# Patient Record
Sex: Female | Born: 1935 | Race: White | Hispanic: No | Smoking: Former smoker
Health system: Southern US, Community
[De-identification: ages and names within clinical notes are randomized; demographics above are authoritative.]

## PROBLEM LIST (undated history)

## (undated) DIAGNOSIS — I4891 Unspecified atrial fibrillation: Secondary | ICD-10-CM

## (undated) DIAGNOSIS — Z9889 Other specified postprocedural states: Secondary | ICD-10-CM

## (undated) DIAGNOSIS — R112 Nausea with vomiting, unspecified: Secondary | ICD-10-CM

## (undated) DIAGNOSIS — E785 Hyperlipidemia, unspecified: Secondary | ICD-10-CM

## (undated) DIAGNOSIS — K219 Gastro-esophageal reflux disease without esophagitis: Secondary | ICD-10-CM

## (undated) DIAGNOSIS — K589 Irritable bowel syndrome without diarrhea: Secondary | ICD-10-CM

## (undated) DIAGNOSIS — M1990 Unspecified osteoarthritis, unspecified site: Secondary | ICD-10-CM

## (undated) DIAGNOSIS — D329 Benign neoplasm of meninges, unspecified: Secondary | ICD-10-CM

## (undated) DIAGNOSIS — S42309A Unspecified fracture of shaft of humerus, unspecified arm, initial encounter for closed fracture: Secondary | ICD-10-CM

## (undated) DIAGNOSIS — B1920 Unspecified viral hepatitis C without hepatic coma: Secondary | ICD-10-CM

## (undated) DIAGNOSIS — I10 Essential (primary) hypertension: Secondary | ICD-10-CM

## (undated) HISTORY — DX: Hyperlipidemia, unspecified: E78.5

## (undated) HISTORY — DX: Gastro-esophageal reflux disease without esophagitis: K21.9

## (undated) HISTORY — DX: Unspecified viral hepatitis C without hepatic coma: B19.20

## (undated) HISTORY — PX: TOTAL KNEE ARTHROPLASTY: SHX125

## (undated) HISTORY — PX: COLONOSCOPY W/ BIOPSIES AND POLYPECTOMY: SHX1376

## (undated) HISTORY — DX: Unspecified osteoarthritis, unspecified site: M19.90

## (undated) HISTORY — DX: Essential (primary) hypertension: I10

## (undated) HISTORY — PX: PAROTID GLAND TUMOR EXCISION: SHX5221

## (undated) HISTORY — DX: Unspecified atrial fibrillation: I48.91

## (undated) HISTORY — PX: BREAST SURGERY: SHX581

## (undated) HISTORY — DX: Irritable bowel syndrome, unspecified: K58.9

## (undated) HISTORY — DX: Benign neoplasm of meninges, unspecified: D32.9

---

## 1997-07-18 ENCOUNTER — Other Ambulatory Visit: Admission: RE | Admit: 1997-07-18 | Discharge: 1997-07-18 | Payer: Self-pay | Admitting: Family Medicine

## 1997-07-25 ENCOUNTER — Other Ambulatory Visit: Admission: RE | Admit: 1997-07-25 | Discharge: 1997-07-25 | Payer: Self-pay | Admitting: Family Medicine

## 1998-02-27 ENCOUNTER — Ambulatory Visit (HOSPITAL_COMMUNITY): Admission: RE | Admit: 1998-02-27 | Discharge: 1998-02-27 | Payer: Self-pay | Admitting: Family Medicine

## 1999-01-28 ENCOUNTER — Emergency Department (HOSPITAL_COMMUNITY): Admission: EM | Admit: 1999-01-28 | Discharge: 1999-01-28 | Payer: Self-pay | Admitting: Emergency Medicine

## 1999-01-28 ENCOUNTER — Encounter: Payer: Self-pay | Admitting: Emergency Medicine

## 1999-03-02 ENCOUNTER — Inpatient Hospital Stay (HOSPITAL_COMMUNITY): Admission: EM | Admit: 1999-03-02 | Discharge: 1999-03-06 | Payer: Self-pay | Admitting: Family Medicine

## 1999-03-02 ENCOUNTER — Encounter (INDEPENDENT_AMBULATORY_CARE_PROVIDER_SITE_OTHER): Payer: Self-pay | Admitting: Specialist

## 1999-03-30 ENCOUNTER — Other Ambulatory Visit: Admission: RE | Admit: 1999-03-30 | Discharge: 1999-03-30 | Payer: Self-pay | Admitting: Family Medicine

## 1999-04-05 ENCOUNTER — Encounter: Admission: RE | Admit: 1999-04-05 | Discharge: 1999-04-05 | Payer: Self-pay | Admitting: Family Medicine

## 1999-04-05 ENCOUNTER — Encounter: Payer: Self-pay | Admitting: Family Medicine

## 2000-06-27 ENCOUNTER — Encounter (HOSPITAL_BASED_OUTPATIENT_CLINIC_OR_DEPARTMENT_OTHER): Payer: Self-pay | Admitting: Internal Medicine

## 2000-06-27 ENCOUNTER — Encounter: Admission: RE | Admit: 2000-06-27 | Discharge: 2000-06-27 | Payer: Self-pay | Admitting: Internal Medicine

## 2001-07-09 ENCOUNTER — Encounter: Admission: RE | Admit: 2001-07-09 | Discharge: 2001-07-09 | Payer: Self-pay | Admitting: Internal Medicine

## 2001-07-09 ENCOUNTER — Encounter (HOSPITAL_BASED_OUTPATIENT_CLINIC_OR_DEPARTMENT_OTHER): Payer: Self-pay | Admitting: Internal Medicine

## 2002-01-13 ENCOUNTER — Encounter: Admission: RE | Admit: 2002-01-13 | Discharge: 2002-01-29 | Payer: Self-pay | Admitting: Internal Medicine

## 2002-07-30 ENCOUNTER — Encounter: Admission: RE | Admit: 2002-07-30 | Discharge: 2002-07-30 | Payer: Self-pay | Admitting: Internal Medicine

## 2002-07-30 ENCOUNTER — Encounter (HOSPITAL_BASED_OUTPATIENT_CLINIC_OR_DEPARTMENT_OTHER): Payer: Self-pay | Admitting: Internal Medicine

## 2002-09-09 ENCOUNTER — Encounter (HOSPITAL_BASED_OUTPATIENT_CLINIC_OR_DEPARTMENT_OTHER): Payer: Self-pay | Admitting: Internal Medicine

## 2002-09-09 ENCOUNTER — Encounter: Admission: RE | Admit: 2002-09-09 | Discharge: 2002-09-09 | Payer: Self-pay | Admitting: Internal Medicine

## 2002-09-09 ENCOUNTER — Encounter: Payer: Self-pay | Admitting: Radiology

## 2002-09-24 ENCOUNTER — Encounter: Admission: RE | Admit: 2002-09-24 | Discharge: 2002-09-24 | Payer: Self-pay | Admitting: Internal Medicine

## 2002-09-24 ENCOUNTER — Encounter (HOSPITAL_BASED_OUTPATIENT_CLINIC_OR_DEPARTMENT_OTHER): Payer: Self-pay | Admitting: Internal Medicine

## 2002-10-05 ENCOUNTER — Ambulatory Visit (HOSPITAL_COMMUNITY): Admission: RE | Admit: 2002-10-05 | Discharge: 2002-10-05 | Payer: Self-pay | Admitting: Orthopedic Surgery

## 2003-03-10 ENCOUNTER — Encounter: Admission: RE | Admit: 2003-03-10 | Discharge: 2003-03-10 | Payer: Self-pay | Admitting: Internal Medicine

## 2003-12-20 ENCOUNTER — Ambulatory Visit: Payer: Self-pay | Admitting: Gastroenterology

## 2003-12-23 ENCOUNTER — Ambulatory Visit: Payer: Self-pay | Admitting: Gastroenterology

## 2003-12-28 ENCOUNTER — Ambulatory Visit: Payer: Self-pay | Admitting: Gastroenterology

## 2003-12-30 ENCOUNTER — Ambulatory Visit (HOSPITAL_COMMUNITY): Admission: RE | Admit: 2003-12-30 | Discharge: 2003-12-30 | Payer: Self-pay | Admitting: Gastroenterology

## 2004-01-31 ENCOUNTER — Ambulatory Visit (HOSPITAL_COMMUNITY): Admission: RE | Admit: 2004-01-31 | Discharge: 2004-01-31 | Payer: Self-pay | Admitting: Gastroenterology

## 2004-01-31 ENCOUNTER — Ambulatory Visit: Payer: Self-pay | Admitting: Gastroenterology

## 2004-02-14 ENCOUNTER — Ambulatory Visit: Payer: Self-pay | Admitting: Gastroenterology

## 2004-03-02 ENCOUNTER — Ambulatory Visit (HOSPITAL_COMMUNITY): Admission: RE | Admit: 2004-03-02 | Discharge: 2004-03-02 | Payer: Self-pay | Admitting: Gastroenterology

## 2004-03-13 ENCOUNTER — Ambulatory Visit: Payer: Self-pay | Admitting: Gastroenterology

## 2004-04-11 ENCOUNTER — Encounter: Admission: RE | Admit: 2004-04-11 | Discharge: 2004-04-11 | Payer: Self-pay | Admitting: Internal Medicine

## 2004-06-21 ENCOUNTER — Ambulatory Visit: Payer: Self-pay | Admitting: Gastroenterology

## 2004-08-03 ENCOUNTER — Ambulatory Visit: Payer: Self-pay | Admitting: Gastroenterology

## 2005-05-01 ENCOUNTER — Encounter: Admission: RE | Admit: 2005-05-01 | Discharge: 2005-05-01 | Payer: Self-pay | Admitting: Internal Medicine

## 2005-05-06 ENCOUNTER — Encounter: Admission: RE | Admit: 2005-05-06 | Discharge: 2005-05-06 | Payer: Self-pay | Admitting: Internal Medicine

## 2005-11-07 HISTORY — PX: TOTAL HIP ARTHROPLASTY: SHX124

## 2005-11-11 ENCOUNTER — Inpatient Hospital Stay (HOSPITAL_COMMUNITY): Admission: EM | Admit: 2005-11-11 | Discharge: 2005-11-15 | Payer: Self-pay | Admitting: Emergency Medicine

## 2005-11-12 ENCOUNTER — Ambulatory Visit: Payer: Self-pay | Admitting: Physical Medicine & Rehabilitation

## 2006-05-07 ENCOUNTER — Encounter: Admission: RE | Admit: 2006-05-07 | Discharge: 2006-05-07 | Payer: Self-pay | Admitting: Internal Medicine

## 2006-11-10 ENCOUNTER — Emergency Department (HOSPITAL_COMMUNITY): Admission: EM | Admit: 2006-11-10 | Discharge: 2006-11-10 | Payer: Self-pay | Admitting: Emergency Medicine

## 2006-11-14 HISTORY — PX: CARDIOVASCULAR STRESS TEST: SHX262

## 2006-12-05 ENCOUNTER — Encounter: Admission: RE | Admit: 2006-12-05 | Discharge: 2006-12-05 | Payer: Self-pay | Admitting: Orthopedic Surgery

## 2007-01-07 ENCOUNTER — Inpatient Hospital Stay (HOSPITAL_COMMUNITY): Admission: RE | Admit: 2007-01-07 | Discharge: 2007-01-12 | Payer: Self-pay | Admitting: Orthopedic Surgery

## 2007-05-08 ENCOUNTER — Encounter: Admission: RE | Admit: 2007-05-08 | Discharge: 2007-05-08 | Payer: Self-pay | Admitting: Internal Medicine

## 2007-07-29 ENCOUNTER — Other Ambulatory Visit: Admission: RE | Admit: 2007-07-29 | Discharge: 2007-07-29 | Payer: Self-pay | Admitting: Otolaryngology

## 2007-08-06 ENCOUNTER — Encounter: Admission: RE | Admit: 2007-08-06 | Discharge: 2007-08-06 | Payer: Self-pay | Admitting: Otolaryngology

## 2007-10-07 ENCOUNTER — Ambulatory Visit (HOSPITAL_COMMUNITY): Admission: RE | Admit: 2007-10-07 | Discharge: 2007-10-08 | Payer: Self-pay | Admitting: Otolaryngology

## 2007-10-07 ENCOUNTER — Encounter (INDEPENDENT_AMBULATORY_CARE_PROVIDER_SITE_OTHER): Payer: Self-pay | Admitting: Otolaryngology

## 2008-05-09 ENCOUNTER — Encounter: Admission: RE | Admit: 2008-05-09 | Discharge: 2008-05-09 | Payer: Self-pay | Admitting: Internal Medicine

## 2008-07-25 ENCOUNTER — Inpatient Hospital Stay (HOSPITAL_COMMUNITY): Admission: RE | Admit: 2008-07-25 | Discharge: 2008-07-28 | Payer: Self-pay | Admitting: Orthopedic Surgery

## 2008-08-29 ENCOUNTER — Inpatient Hospital Stay (HOSPITAL_COMMUNITY): Admission: AD | Admit: 2008-08-29 | Discharge: 2008-08-31 | Payer: Self-pay | Admitting: Internal Medicine

## 2008-11-25 ENCOUNTER — Inpatient Hospital Stay (HOSPITAL_COMMUNITY): Admission: EM | Admit: 2008-11-25 | Discharge: 2008-11-26 | Payer: Self-pay | Admitting: Emergency Medicine

## 2008-12-02 ENCOUNTER — Encounter: Admission: RE | Admit: 2008-12-02 | Discharge: 2008-12-02 | Payer: Self-pay | Admitting: Internal Medicine

## 2009-05-10 ENCOUNTER — Encounter: Admission: RE | Admit: 2009-05-10 | Discharge: 2009-05-10 | Payer: Self-pay | Admitting: Internal Medicine

## 2009-05-11 ENCOUNTER — Encounter: Admission: RE | Admit: 2009-05-11 | Discharge: 2009-05-11 | Payer: Self-pay | Admitting: Gastroenterology

## 2009-10-16 ENCOUNTER — Ambulatory Visit: Payer: Self-pay | Admitting: Cardiology

## 2009-10-25 ENCOUNTER — Ambulatory Visit: Payer: Self-pay | Admitting: Cardiovascular Disease

## 2009-10-25 ENCOUNTER — Ambulatory Visit: Payer: Self-pay

## 2009-10-25 ENCOUNTER — Encounter: Payer: Self-pay | Admitting: Cardiology

## 2009-10-25 ENCOUNTER — Ambulatory Visit (HOSPITAL_COMMUNITY): Admission: RE | Admit: 2009-10-25 | Discharge: 2009-10-25 | Payer: Self-pay | Admitting: Cardiology

## 2009-10-25 HISTORY — PX: TRANSTHORACIC ECHOCARDIOGRAM: SHX275

## 2009-10-30 ENCOUNTER — Ambulatory Visit: Payer: Self-pay | Admitting: Cardiology

## 2009-10-30 ENCOUNTER — Encounter: Admission: RE | Admit: 2009-10-30 | Discharge: 2009-10-30 | Payer: Self-pay | Admitting: Cardiology

## 2009-11-13 ENCOUNTER — Ambulatory Visit: Payer: Self-pay | Admitting: Cardiology

## 2009-11-14 ENCOUNTER — Ambulatory Visit (HOSPITAL_COMMUNITY): Admission: RE | Admit: 2009-11-14 | Discharge: 2009-11-14 | Payer: Self-pay | Admitting: Cardiology

## 2009-11-21 ENCOUNTER — Encounter: Admission: RE | Admit: 2009-11-21 | Discharge: 2009-11-21 | Payer: Self-pay | Admitting: Neurosurgery

## 2009-12-12 ENCOUNTER — Ambulatory Visit: Payer: Self-pay | Admitting: Cardiovascular Disease

## 2009-12-19 ENCOUNTER — Ambulatory Visit: Payer: Self-pay | Admitting: Cardiology

## 2010-01-18 ENCOUNTER — Ambulatory Visit: Payer: Self-pay | Admitting: Cardiology

## 2010-01-28 ENCOUNTER — Encounter (HOSPITAL_BASED_OUTPATIENT_CLINIC_OR_DEPARTMENT_OTHER): Payer: Self-pay | Admitting: Internal Medicine

## 2010-02-13 ENCOUNTER — Other Ambulatory Visit: Payer: Self-pay | Admitting: Gastroenterology

## 2010-02-14 ENCOUNTER — Other Ambulatory Visit: Payer: Self-pay | Admitting: Gastroenterology

## 2010-02-19 ENCOUNTER — Other Ambulatory Visit (INDEPENDENT_AMBULATORY_CARE_PROVIDER_SITE_OTHER): Payer: Medicare Other

## 2010-02-19 DIAGNOSIS — E7800 Pure hypercholesterolemia, unspecified: Secondary | ICD-10-CM

## 2010-02-19 DIAGNOSIS — I10 Essential (primary) hypertension: Secondary | ICD-10-CM

## 2010-02-20 ENCOUNTER — Ambulatory Visit
Admission: RE | Admit: 2010-02-20 | Discharge: 2010-02-20 | Disposition: A | Discharge status: Home / Self Care | Payer: Medicare Other | Source: Ambulatory Visit | Attending: Gastroenterology | Admitting: Gastroenterology

## 2010-03-06 ENCOUNTER — Inpatient Hospital Stay (HOSPITAL_COMMUNITY)
Admission: AD | Admit: 2010-03-06 | Discharge: 2010-03-09 | DRG: 392 | Disposition: A | Discharge status: Home / Self Care | Payer: Medicare Other | Source: Ambulatory Visit | Attending: Gastroenterology | Admitting: Gastroenterology

## 2010-03-06 ENCOUNTER — Inpatient Hospital Stay (HOSPITAL_COMMUNITY): Payer: Medicare Other

## 2010-03-06 DIAGNOSIS — Z96659 Presence of unspecified artificial knee joint: Secondary | ICD-10-CM

## 2010-03-06 DIAGNOSIS — K219 Gastro-esophageal reflux disease without esophagitis: Secondary | ICD-10-CM | POA: Diagnosis present

## 2010-03-06 DIAGNOSIS — Z96649 Presence of unspecified artificial hip joint: Secondary | ICD-10-CM

## 2010-03-06 DIAGNOSIS — I4891 Unspecified atrial fibrillation: Secondary | ICD-10-CM | POA: Diagnosis present

## 2010-03-06 DIAGNOSIS — Z79899 Other long term (current) drug therapy: Secondary | ICD-10-CM

## 2010-03-06 DIAGNOSIS — K5900 Constipation, unspecified: Secondary | ICD-10-CM | POA: Diagnosis present

## 2010-03-06 DIAGNOSIS — K3184 Gastroparesis: Principal | ICD-10-CM | POA: Diagnosis present

## 2010-03-06 DIAGNOSIS — E785 Hyperlipidemia, unspecified: Secondary | ICD-10-CM | POA: Diagnosis present

## 2010-03-06 DIAGNOSIS — M129 Arthropathy, unspecified: Secondary | ICD-10-CM | POA: Diagnosis present

## 2010-03-06 DIAGNOSIS — I498 Other specified cardiac arrhythmias: Secondary | ICD-10-CM | POA: Diagnosis present

## 2010-03-06 DIAGNOSIS — E860 Dehydration: Secondary | ICD-10-CM | POA: Diagnosis present

## 2010-03-06 LAB — COMPREHENSIVE METABOLIC PANEL
ALT: 17 U/L (ref 0–35)
AST: 20 U/L (ref 0–37)
Albumin: 3.8 g/dL (ref 3.5–5.2)
Alkaline Phosphatase: 80 U/L (ref 39–117)
BUN: 16 mg/dL (ref 6–23)
CO2: 27 mEq/L (ref 19–32)
Calcium: 9.8 mg/dL (ref 8.4–10.5)
Chloride: 100 mEq/L (ref 96–112)
Creatinine, Ser: 0.95 mg/dL (ref 0.4–1.2)
GFR calc Af Amer: >60 mL/min (ref >60)
GFR calc non Af Amer: 57 mL/min — ABNORMAL LOW (ref >60)
Glucose, Bld: 89 mg/dL (ref 70–99)
Potassium: 3.9 mEq/L (ref 3.5–5.1)
Sodium: 140 mEq/L (ref 135–145)
Total Bilirubin: 0.9 mg/dL (ref 0.3–1.2)
Total Protein: 6.1 g/dL (ref 6.0–8.3)

## 2010-03-06 LAB — CBC
HCT: 39.1 % (ref 36.0–46.0)
Hemoglobin: 13 g/dL (ref 12.0–15.0)
MCH: 29.3 pg (ref 26.0–34.0)
MCHC: 33.2 g/dL (ref 30.0–36.0)
MCV: 88.1 fL (ref 78.0–100.0)
Platelets: 208 10*3/uL (ref 150–400)
RBC: 4.44 MIL/uL (ref 3.87–5.11)
RDW: 13.3 % (ref 11.5–15.5)
WBC: 8.4 10*3/uL (ref 4.0–10.5)

## 2010-03-06 LAB — LIPASE, BLOOD: Lipase: 26 U/L (ref 11–59)

## 2010-03-06 LAB — AMYLASE: Amylase: 64 U/L (ref 0–105)

## 2010-03-20 ENCOUNTER — Ambulatory Visit (INDEPENDENT_AMBULATORY_CARE_PROVIDER_SITE_OTHER): Payer: Medicare Other | Admitting: Nurse Practitioner

## 2010-03-20 DIAGNOSIS — I10 Essential (primary) hypertension: Secondary | ICD-10-CM

## 2010-03-20 DIAGNOSIS — I4891 Unspecified atrial fibrillation: Secondary | ICD-10-CM

## 2010-03-20 LAB — CBC
HCT: 39.8 % (ref 36.0–46.0)
Hemoglobin: 13.1 g/dL (ref 12.0–15.0)
MCH: 30.6 pg (ref 26.0–34.0)
MCHC: 32.9 g/dL (ref 30.0–36.0)
MCV: 93 fL (ref 78.0–100.0)
Platelets: 198 10*3/uL (ref 150–400)
RBC: 4.28 MIL/uL (ref 3.87–5.11)
RDW: 14 % (ref 11.5–15.5)
WBC: 8 10*3/uL (ref 4.0–10.5)

## 2010-03-23 NOTE — H&P (Signed)
  Gloria [name], Gloria [name]              ACCOUNT NO.:  [id number]  MEDICAL RECORD NO.:  [id number]           PATIENT TYPE:  LOCATION:                                 FACILITY:  PHYSICIAN:  Ahjanae Cassel L. Malon Kindle., M.D.DATE OF BIRTH:  Oct 04, 1930  DATE OF ADMISSION: DATE OF DISCHARGE:                             HISTORY & PHYSICAL   ADDENDUM  PHYSICAL EXAMINATION:  VITAL SIGNS:  Temperature 96.7; weight 150, down approximately 8 pounds; pulse 68; blood pressure 122/68. GENERAL:  A somewhat shaky white female, in no acute distress. HEENT:  Eyes:  Sclerae nonicteric. NECK:  Supple.  No lymphadenopathy. LUNGS:  Clear anteriorly and posteriorly. HEART:  Regular rate and rhythm without murmurs or gallops. ABDOMEN:  Soft and completely nontender.  Bowel sounds are normal.  ASSESSMENT: 1. Nausea, vomiting, and dehydration - this could be due to many     things.  She is complaining of coughing up material which is new.     It is possibly she could have a pneumonia or urinary tract     infection and this could all be her gastroparesis.  She is     dehydrated and she will need to come in for fluids and further     workup. 2. Gastroparesis with previous treatments with domperidone from     [place], Zelnorm, Reglan, and most recently erythromycin.  Recent     workup including esophagogastroduodenoscopy and gallbladder     ultrasound had been negative. 3. Atrial fibrillation on flecainide and Pradaxa. 4. Hypertension. 5. Small brain tumor that apparently is not going and is felt to be     insignificant and has been followed with CTs and MRIs.  PLAN:  We will admit the patient to the hospital with IV fluids.  We will obtain chest x-ray.  We will put her on IV erythromycin.  Dr. Silvano Rusk group will see her here in the hospital.  We will also obtain EKG.          ______________________________ Llana Aliment Malon Kindle., M.D.     Waldron Session  D:  03/06/2010  T:  03/07/2010  Job:  [id number]  cc:    Barry Dienes. Eloise Harman, M.D. Peter M. [place]il[place], M.D.  Electronically Signed by Carman Ching M.D. on 03/23/2010 09:26:44 AM

## 2010-03-23 NOTE — H&P (Signed)
[name], [name]              ACCOUNT NO.:  [id number]  MEDICAL RECORD NO.:  [id number]           PATIENT TYPE:  I  LOCATION:  5530                         FACILITY:  [facility]  PHYSICIAN:  Gloria [name]., M.D.DATE OF BIRTH:  [birth year]  DATE OF ADMISSION:  03/06/2010 DATE OF DISCHARGE:                             HISTORY & PHYSICAL   PRIMARY CARE PHYSICIAN:  Gloria Dienes. Eloise Harman, MD  CARDIOLOGIST:  Gloria M. [place]il[place], MD  REASON FOR ADMISSION:  Nausea, vomiting, coughing, and dehydration.  HISTORY:  The patient is a 77 year old woman who has recently come back to our office after being absent for approximately 6 months.  She has gastroparesis and has had this for a number of years.  She has had ultrasounds of the abdomen which have been negative and has had documented gastroparesis with gastric emptying scan.  She has been on omeprazole.  She has been on low-dose erythromycin which has been doing well.  When we last saw her in August, she was asymptomatic.  She has developed atrial fib and has been on flecainide and Pradaxa.  She was taking the erythromycin and that was stopped because of some potential interaction with the flecainide.  She developed progressive bloating and nausea, inability to eat, constipation, and had lost some weight.  We saw her in the office and discussed this with Dr. Swaziland and it appears flecainide and erythromycin do not have an interaction as with some other antibiotics and we started her back on erythromycin. Unfortunately, this really did not help and she has continued to have nausea and vomiting and has been seen in the office on several occasions.  She had been on Reglan in the past and was not sure why she did use it and this was started back and has caused severe shaking and tremors.  She came back to the office today having lost approximately 8 pounds with continued nausea and vomiting.  According to her daughter, she has not kept down  anything now for several days since feeling weak and dizzy and faint.  She also has been coughing and states that she has been coughing ever since she was started on lisinopril.  Apparently, the dose of that has been cut but she still having coughing.  Upon talking with her, it is not clear whether the coughing and the vomiting is coming first, but this apparently is causing her to have some abdominal pain in the upper abdomen when coughing and vomiting.  She is coughing up some greenish material.  She also states that she is having some chills, this usually occurs after coughing.  She comes as a work-in today with her daughter because she has been unable to keep down any food, liquid, or medications now for about 2 days.  CURRENT MEDICATIONS: 1. Crestor 20 mg daily 2. Omeprazole 40 mg b.i.d. 3. Flecainide 50 mg b.i.d. 4. Hydrochlorothiazide 25 mg daily. 5. Lisinopril 10 mg one-half tablet daily was just changed recently     from 20 mg daily. 6. Reglan 5 mg a.c. and h.s. 7. Erythromycin 250 mg t.i.d. 8. Xarelto 20 mg daily.  She  is allergic to PENICILLIN, CODEINE, and CEPHALOSPORINS.  MEDICAL HISTORY:  The patient has a history of a meningioma tumor followed by Gloria [name].  This apparently has not grown and has just been followed with CT scans and MRIs yearly with most recent scan showing no real change.  She has a history of gastroparesis and reflux, has bradycardia atrial fibrillation followed by Dr. Peter [name].  She has high cholesterol and arthritis.  Previous surgeries include hip replacement, knee replacement, some type of neurosurgical procedure in 2009, colonoscopies, and EGDs.  FAMILY HISTORY:  Noncontributory.  SOCIAL HISTORY:  She is a smoker, quit about 25 years ago.  Does not drink alcohol.  She is retired, married, and has two children.  PHYSICAL EXAMINATION:  VITAL SIGNS:  Temperature 96.7, weight 150 down approximately 8 pounds in the past 2 or 3 months,,  pulse 68, blood pressure 122/68. GENERAL:  Somewhat shaky pale white female. EYES:  Sclerae nonicteric. NECK:  Supple. LUNGS:  Clear anteriorly and posteriorly. ABDOMEN:  Soft  Dictation ended at this point.          ______________________________ Gloria Aliment. Malon [name]., M.D.     Gloria [name]  D:  03/06/2010  T:  03/07/2010  Job:  [id number]  Electronically Signed by Gloria [name] M.D. on 03/23/2010 09:26:37 AM

## 2010-04-10 NOTE — Consult Note (Signed)
Gloria [name], Gloria [name]              ACCOUNT NO.:  [id number]  MEDICAL RECORD NO.:  [id number]           PATIENT TYPE:  I  LOCATION:  5530                         FACILITY:  [facility]  PHYSICIAN:  Larina Earthly, M.D.        DATE OF BIRTH:  16-Sep-1930  DATE OF CONSULTATION:  03/06/2010 DATE OF DISCHARGE:                                CONSULTATION   REASON FOR CONSULTATION:  Medical management.  HISTORY OF PRESENT ILLNESS:  This is a 77 year old Caucasian female who has a long history of gastroparesis well controlled on erythromycin, and was diagnosed with atrial fibrillation last November and placed on flecainide as well as anticoagulation consisting of Pradaxa.  She now presents with a 1 month history of worsening nausea, vomiting with some subtle right-sided abdominal pain.  Per Dr. Randa Evens, her erythromycin that she was normally taking was discontinued secondary to possible interactions with her flecainide.  Soon after discontinuation of this medication, the patient developed significant nausea and vomiting.  Dr. Randa Evens did discuss this with Dr. Peter [place]iland by report and erythromycin was restarted, however, the patient had persistent nausea and vomiting despite this initiation.  Reglan was also started, however, the patient developed tremors and shakes and this was subsequently discontinued.  The patient did also undergo subsequent ultrasound that was unremarkable as well as endoscopy that was unremarkable.  Throughout this time, laboratory evaluation was also unremarkable for any etiology. The patient states that she has been on the flecainide since being diagnosed with the atrial fibrillation and DC cardioversion was everted secondary to her conversion back to normal sinus rhythm.  She states that despite the above-mentioned interventions for her gastroparesis, she has been quite miserable for the last 4 weeks and saw Dr. Randa Evens for an office visit and given her persistent nausea  and vomiting as well as the definitive need to maintain her antiarrhythmic therapy as well as anticoagulation, he advised admission for further evaluation and management for this refractory nausea and vomiting.  We were called to consult for medical management, especially in the setting of her refractory gastroparesis, atrial fibrillation, and treatment of hypertension and hyperlipidemia.  Currently, the patient states that she has not had a significant amount of nausea and vomiting today for unclear reasons.  She denies any fevers, chills, focal neurological deficits, headaches, visual abnormalities, chest pain or shortness of breath.  She does have some intermittent issues with right-sided flank pain which has been somewhat chronic in nature.  She has been able to take down some clear fluids today and this is unclear whether this actually improved at the initiation of IV fluids upon admission by Dr. Randa Evens.  Recent notes from Dr. Randa Evens and Dr. [place] were reviewed including laboratory results, ultrasound results, and endoscopy results.REVIEW OF SYSTEMS:  Otherwise unremarkable and specifically negative for diarrhea, blood in stool or urine, bleeding complications from the Pradaxa which is recently changed over to Xarelto during the last 1 week by Dr. Swaziland given its daily administration versus b.i.d. administration.  The patient denies any presyncope or palpitations.  PAST MEDICAL HISTORY: 1. GERD. 2. Gastroparesis. 3. Hypertension. 4. C. diff  colitis in 2001. 5. History of a junctional nevus after mole excision. 6. History of atypical chest pain. 7. Osteopenia. 8. Neck pain. 9. Left knee pain. 10.Hyperlipidemia. 11.Left lens opacities. 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 13.Right occipital meningioma status post excision in 2010, with     resulting slight ptosis on the right.  PHYSICIANS INVOLVED IN CARE: 1. Dionne Ano. Amanda Pea, MD 2. Ronald A.  Gioffre, MD 3. Lissa Merlin, MD 4. Richarda Overlie, MD 5. Gloris Manchester. Wolicki, MD 6. James L. Randa Evens, MD 7. Peter M. [place], MD  PAST SURGICAL HISTORY: 1. Right breast lumpectomy in 1963. 2. Left knee arthroscopy in 2004. 3. Cataracts in 2004, and 2005. 4. Left hip fracture status post open reduction and internal fixation     in 2007. 5. Total hip replacement on the right in 2008. 6. Right parathyroidectomy in 2009. 7. Right elbow lateral dislocation by Dr. Amanda Pea in 2010. 8. Colonoscopy in 2011.  SOCIAL HISTORY:  The patient is married, has 2 children, retired, nonsmoker, nondrinker.  FAMILY HISTORY:  Significant for coronary artery disease in the 80s, history of bypass surgery in one brother, and a positive family history for premature cardiovascular disease.  CURRENT MEDICATIONS: 1. Crestor 20 mg each day. 2. Omeprazole 20 mg each day. 3. Flecainide 50 mg b.i.d. 4. Hydrochlorothiazide 25 mg daily. 5. Lisinopril 5 mg p.o. daily. 6. Reglan discontinued. 7. Erythromycin 250 mg t.i.d. 8. Xarelto 20 mg p.o. daily.  ALLERGIES:  PENICILLIN, CEPHALOSPORINS, and CODEINE.  EKG pending.  LABORATORY EVALUATION:  White blood cell count 8.4, hemoglobin 13.0, hematocrit 39%, platelet count 208.  Sodium 140, potassium 3.9, serum CO2 of 27, BUN 16, creatinine 0.95, glucose 89.  AST 20, ALT 17, alkaline phosphatase 80, total bilirubin 0.9, amylase 64, lipase 26, albumin 3.8, calcium 9.8.  Chest x-ray reveals questionable COPD.  No apparent disease.  PHYSICAL EXAMINATION:  GENERAL:  We have a pleasant Caucasian female, lying in bed, no apparent distress, answering all questions appropriately.  Alert and oriented x3. VITAL SIGNS:  Blood pressure 120/74, heart rate 67, respirations 18 and nonlabored, oxygen saturation 95% on room air, temperature 97.7 degrees Fahrenheit. HEENT:  Sclerae anicteric.  Extraocular movements are intact.  Slight ptosis on the right.  Face is  otherwise symmetrical.  Tongue is midline. There are no oropharyngeal lesions. NECK:  Supple.  There is no cervical lymphadenopathy.  No carotid bruits appreciated. LUNGS:  Clear to auscultation bilaterally. CARDIOVASCULAR:  Regular rate and rhythm. ABDOMEN:  Soft, nontender, nondistended abdomen.  Bowel sounds are present. EXTREMITIES:  No edema.  Pedal pulses are intact.  The patient can move all 4 extremities.  Cranial nerves II through XII except for the right eye abnormalities are unremarkable.  Sensory exam and strength exam appears grossly intact.  ASSESSMENT AND PLAN: 1. Gastroparesis with refractory nausea and vomiting and refractory to     reinitiation of erythromycin, having failed Reglan secondary to     tremors while on a proton pump inhibitor.  EGD and ultrasound     unremarkable.  Question the need for gastric emptying study and     also question relationship to flecainide and/or Crestor.  However,     the patient has been on Crestor for a lengthy period of time.     Pancreatic enzymes and electrolytes are normal at this time.  The     patient is tolerating a clear fluid diet at this time with     advancement of diet  per Gastroenterology who has also admitted the     patient. 2. Atrial fibrillation on flecainide and anticoagulation with Xarelto     with no bleeding complications.  This is a relatively new diagnosis     dating right from last November.  Unclear whether current     medications may be exacerbating her condition. 3. Hypertension.  Blood pressure is satisfactory on her current     medications. 4. Hyperlipidemia with normal liver function tests on Crestor.  We     will continue to monitor.     Larina Earthly, M.D.     RA/MEDQ  D:  03/06/2010  T:  03/07/2010  Job:  [id number]  cc:   Barry Dienes. Eloise Harman, M.D. James L. Malon Kindle., M.D. Peter M. [place], M.D.  Electronically Signed by Larina Earthly M.D. on 04/10/2010 10:11:07 PM

## 2010-04-11 LAB — POCT CARDIAC MARKERS
CKMB, poc: <1 ng/mL — ABNORMAL LOW (ref 1.0–8.0)
Myoglobin, poc: 109 ng/mL (ref 12–200)
Troponin i, poc: <0.05 ng/mL (ref 0.00–0.09)

## 2010-04-11 LAB — CBC
HCT: 37.8 % (ref 36.0–46.0)
Hemoglobin: 12.8 g/dL (ref 12.0–15.0)
MCHC: 33.8 g/dL (ref 30.0–36.0)
MCV: 90.1 fL (ref 78.0–100.0)
Platelets: 199 10*3/uL (ref 150–400)
RBC: 4.19 MIL/uL (ref 3.87–5.11)
RDW: 13.6 % (ref 11.5–15.5)
WBC: 8.3 10*3/uL (ref 4.0–10.5)

## 2010-04-11 LAB — BASIC METABOLIC PANEL
BUN: 17 mg/dL (ref 6–23)
CO2: 18 mEq/L — ABNORMAL LOW (ref 19–32)
Calcium: 8.4 mg/dL (ref 8.4–10.5)
Chloride: 109 mEq/L (ref 96–112)
Creatinine, Ser: 0.58 mg/dL (ref 0.4–1.2)
GFR calc Af Amer: >60 mL/min (ref >60)
GFR calc non Af Amer: >60 mL/min (ref >60)
Glucose, Bld: 127 mg/dL — ABNORMAL HIGH (ref 70–99)
Potassium: 3.2 mEq/L — ABNORMAL LOW (ref 3.5–5.1)
Sodium: 137 mEq/L (ref 135–145)

## 2010-04-11 LAB — DIFFERENTIAL
Basophils Absolute: 0 10*3/uL (ref 0.0–0.1)
Basophils Relative: 0 % (ref 0–1)
Eosinophils Absolute: 0.1 10*3/uL (ref 0.0–0.7)
Eosinophils Relative: 1 % (ref 0–5)
Lymphocytes Relative: 18 % (ref 12–46)
Lymphs Abs: 1.5 10*3/uL (ref 0.7–4.0)
Monocytes Absolute: 0.6 10*3/uL (ref 0.1–1.0)
Monocytes Relative: 7 % (ref 3–12)
Neutro Abs: 6.1 10*3/uL (ref 1.7–7.7)
Neutrophils Relative %: 74 % (ref 43–77)

## 2010-04-11 LAB — APTT: aPTT: 27 seconds (ref 24–37)

## 2010-04-11 LAB — TYPE AND SCREEN
ABO/RH(D): O POS
Antibody Screen: NEGATIVE

## 2010-04-11 LAB — GLUCOSE, CAPILLARY: Glucose-Capillary: 132 mg/dL — ABNORMAL HIGH (ref 70–99)

## 2010-04-11 LAB — PROTIME-INR
INR: 1.14 (ref 0.00–1.49)
Prothrombin Time: 14.5 seconds (ref 11.6–15.2)

## 2010-04-14 LAB — CBC
HCT: 34.6 % — ABNORMAL LOW (ref 36.0–46.0)
HCT: 38 % (ref 36.0–46.0)
Hemoglobin: 11.7 g/dL — ABNORMAL LOW (ref 12.0–15.0)
Hemoglobin: 12.6 g/dL (ref 12.0–15.0)
MCHC: 33.3 g/dL (ref 30.0–36.0)
MCHC: 33.8 g/dL (ref 30.0–36.0)
MCV: 93.4 fL (ref 78.0–100.0)
MCV: 94.1 fL (ref 78.0–100.0)
Platelets: 167 10*3/uL (ref 150–400)
Platelets: 198 10*3/uL (ref 150–400)
RBC: 3.68 MIL/uL — ABNORMAL LOW (ref 3.87–5.11)
RBC: 4.06 MIL/uL (ref 3.87–5.11)
RDW: 14.4 % (ref 11.5–15.5)
RDW: 14.7 % (ref 11.5–15.5)
WBC: 6.2 10*3/uL (ref 4.0–10.5)
WBC: 7 10*3/uL (ref 4.0–10.5)

## 2010-04-14 LAB — COMPREHENSIVE METABOLIC PANEL
ALT: 11 U/L (ref 0–35)
ALT: 12 U/L (ref 0–35)
AST: 20 U/L (ref 0–37)
AST: 21 U/L (ref 0–37)
Albumin: 3.1 g/dL — ABNORMAL LOW (ref 3.5–5.2)
Albumin: 3.6 g/dL (ref 3.5–5.2)
Alkaline Phosphatase: 60 U/L (ref 39–117)
Alkaline Phosphatase: 67 U/L (ref 39–117)
BUN: 10 mg/dL (ref 6–23)
BUN: 2 mg/dL — ABNORMAL LOW (ref 6–23)
CO2: 25 mEq/L (ref 19–32)
CO2: 25 mEq/L (ref 19–32)
Calcium: 8.8 mg/dL (ref 8.4–10.5)
Calcium: 9.2 mg/dL (ref 8.4–10.5)
Chloride: 107 mEq/L (ref 96–112)
Chloride: 111 mEq/L (ref 96–112)
Creatinine, Ser: 0.56 mg/dL (ref 0.4–1.2)
Creatinine, Ser: 0.68 mg/dL (ref 0.4–1.2)
GFR calc Af Amer: >60 mL/min (ref >60)
GFR calc Af Amer: >60 mL/min (ref >60)
GFR calc non Af Amer: >60 mL/min (ref >60)
GFR calc non Af Amer: >60 mL/min (ref >60)
Glucose, Bld: 92 mg/dL (ref 70–99)
Glucose, Bld: 97 mg/dL (ref 70–99)
Potassium: 3.1 mEq/L — ABNORMAL LOW (ref 3.5–5.1)
Potassium: 3.6 mEq/L (ref 3.5–5.1)
Sodium: 140 mEq/L (ref 135–145)
Sodium: 142 mEq/L (ref 135–145)
Total Bilirubin: 1.1 mg/dL (ref 0.3–1.2)
Total Bilirubin: 1.1 mg/dL (ref 0.3–1.2)
Total Protein: 5.3 g/dL — ABNORMAL LOW (ref 6.0–8.3)
Total Protein: 5.9 g/dL — ABNORMAL LOW (ref 6.0–8.3)

## 2010-04-14 LAB — URINE CULTURE
Colony Count: NO GROWTH
Culture: NO GROWTH
Special Requests: POSITIVE

## 2010-04-14 LAB — URINALYSIS, ROUTINE W REFLEX MICROSCOPIC
Bilirubin Urine: NEGATIVE
Glucose, UA: NEGATIVE mg/dL
Hgb urine dipstick: NEGATIVE
Ketones, ur: NEGATIVE mg/dL
Nitrite: NEGATIVE
Protein, ur: NEGATIVE mg/dL
Specific Gravity, Urine: 1.01 (ref 1.005–1.030)
Urobilinogen, UA: 1 mg/dL (ref 0.0–1.0)
pH: 6 (ref 5.0–8.0)

## 2010-04-14 LAB — URINE MICROSCOPIC-ADD ON

## 2010-04-14 LAB — LIPASE, BLOOD: Lipase: 22 U/L (ref 11–59)

## 2010-04-14 LAB — AMYLASE: Amylase: 72 U/L (ref 27–131)

## 2010-04-15 LAB — CBC
HCT: 31.1 % — ABNORMAL LOW (ref 36.0–46.0)
HCT: 33.2 % — ABNORMAL LOW (ref 36.0–46.0)
HCT: 34 % — ABNORMAL LOW (ref 36.0–46.0)
HCT: 44.4 % (ref 36.0–46.0)
Hemoglobin: 10.6 g/dL — ABNORMAL LOW (ref 12.0–15.0)
Hemoglobin: 11.4 g/dL — ABNORMAL LOW (ref 12.0–15.0)
Hemoglobin: 11.6 g/dL — ABNORMAL LOW (ref 12.0–15.0)
Hemoglobin: 15.1 g/dL — ABNORMAL HIGH (ref 12.0–15.0)
MCHC: 34 g/dL (ref 30.0–36.0)
MCHC: 34.1 g/dL (ref 30.0–36.0)
MCHC: 34.3 g/dL (ref 30.0–36.0)
MCHC: 34 g/dL (ref 30.0–36.0)
MCV: 92.8 fL (ref 78.0–100.0)
MCV: 93.1 fL (ref 78.0–100.0)
MCV: 94.3 fL (ref 78.0–100.0)
MCV: 93.8 fL (ref 78.0–100.0)
Platelets: 126 10*3/uL — ABNORMAL LOW (ref 150–400)
Platelets: 129 10*3/uL — ABNORMAL LOW (ref 150–400)
Platelets: 135 10*3/uL — ABNORMAL LOW (ref 150–400)
Platelets: 229 10*3/uL (ref 150–400)
RBC: 3.3 MIL/uL — ABNORMAL LOW (ref 3.87–5.11)
RBC: 3.58 MIL/uL — ABNORMAL LOW (ref 3.87–5.11)
RBC: 3.65 MIL/uL — ABNORMAL LOW (ref 3.87–5.11)
RBC: 4.74 MIL/uL (ref 3.87–5.11)
RDW: 13.6 % (ref 11.5–15.5)
RDW: 13.7 % (ref 11.5–15.5)
RDW: 13.9 % (ref 11.5–15.5)
RDW: 13.6 % (ref 11.5–15.5)
WBC: 7.2 10*3/uL (ref 4.0–10.5)
WBC: 8.4 10*3/uL (ref 4.0–10.5)
WBC: 9.4 10*3/uL (ref 4.0–10.5)
WBC: 10.5 10*3/uL (ref 4.0–10.5)

## 2010-04-15 LAB — BASIC METABOLIC PANEL
BUN: 5 mg/dL — ABNORMAL LOW (ref 6–23)
BUN: 5 mg/dL — ABNORMAL LOW (ref 6–23)
BUN: 9 mg/dL (ref 6–23)
BUN: 12 mg/dL (ref 6–23)
CO2: 27 mEq/L (ref 19–32)
CO2: 30 mEq/L (ref 19–32)
CO2: 33 mEq/L — ABNORMAL HIGH (ref 19–32)
CO2: 29 mEq/L (ref 19–32)
Calcium: 8.5 mg/dL (ref 8.4–10.5)
Calcium: 8.9 mg/dL (ref 8.4–10.5)
Calcium: 8.9 mg/dL (ref 8.4–10.5)
Calcium: 9.8 mg/dL (ref 8.4–10.5)
Chloride: 100 mEq/L (ref 96–112)
Chloride: 104 mEq/L (ref 96–112)
Chloride: 104 mEq/L (ref 96–112)
Chloride: 104 mEq/L (ref 96–112)
Creatinine, Ser: 0.56 mg/dL (ref 0.4–1.2)
Creatinine, Ser: 0.6 mg/dL (ref 0.4–1.2)
Creatinine, Ser: 0.62 mg/dL (ref 0.4–1.2)
Creatinine, Ser: 0.66 mg/dL (ref 0.4–1.2)
GFR calc Af Amer: >60 mL/min (ref >60)
GFR calc Af Amer: >60 mL/min (ref >60)
GFR calc Af Amer: >60 mL/min (ref >60)
GFR calc Af Amer: >60 mL/min (ref >60)
GFR calc non Af Amer: >60 mL/min (ref >60)
GFR calc non Af Amer: >60 mL/min (ref >60)
GFR calc non Af Amer: >60 mL/min (ref >60)
GFR calc non Af Amer: >60 mL/min (ref >60)
Glucose, Bld: 110 mg/dL — ABNORMAL HIGH (ref 70–99)
Glucose, Bld: 120 mg/dL — ABNORMAL HIGH (ref 70–99)
Glucose, Bld: 87 mg/dL (ref 70–99)
Glucose, Bld: 85 mg/dL (ref 70–99)
Potassium: 3.9 mEq/L (ref 3.5–5.1)
Potassium: 4.4 mEq/L (ref 3.5–5.1)
Potassium: 4.5 mEq/L (ref 3.5–5.1)
Potassium: 4.2 mEq/L (ref 3.5–5.1)
Sodium: 135 mEq/L (ref 135–145)
Sodium: 139 mEq/L (ref 135–145)
Sodium: 142 mEq/L (ref 135–145)
Sodium: 140 mEq/L (ref 135–145)

## 2010-04-15 LAB — TYPE AND SCREEN
ABO/RH(D): O POS
Antibody Screen: NEGATIVE

## 2010-04-15 LAB — PROTIME-INR
INR: 1.1 (ref 0.00–1.49)
INR: 1.6 — ABNORMAL HIGH (ref 0.00–1.49)
INR: 1.9 — ABNORMAL HIGH (ref 0.00–1.49)
INR: 1 (ref 0.00–1.49)
Prothrombin Time: 14.3 seconds (ref 11.6–15.2)
Prothrombin Time: 19.3 seconds — ABNORMAL HIGH (ref 11.6–15.2)
Prothrombin Time: 22.5 seconds — ABNORMAL HIGH (ref 11.6–15.2)
Prothrombin Time: 13.1 seconds (ref 11.6–15.2)

## 2010-04-15 LAB — DIFFERENTIAL
Basophils Absolute: 0 10*3/uL (ref 0.0–0.1)
Basophils Relative: 0 % (ref 0–1)
Eosinophils Absolute: 0 10*3/uL (ref 0.0–0.7)
Eosinophils Relative: 0 % (ref 0–5)
Lymphocytes Relative: 15 % (ref 12–46)
Lymphs Abs: 1.6 10*3/uL (ref 0.7–4.0)
Monocytes Absolute: 0.7 10*3/uL (ref 0.1–1.0)
Monocytes Relative: 7 % (ref 3–12)
Neutro Abs: 8.1 10*3/uL — ABNORMAL HIGH (ref 1.7–7.7)
Neutrophils Relative %: 77 % (ref 43–77)

## 2010-04-15 LAB — URINALYSIS, ROUTINE W REFLEX MICROSCOPIC
Bilirubin Urine: NEGATIVE
Glucose, UA: NEGATIVE mg/dL
Hgb urine dipstick: NEGATIVE
Ketones, ur: NEGATIVE mg/dL
Nitrite: NEGATIVE
Protein, ur: NEGATIVE mg/dL
Specific Gravity, Urine: 1.022 (ref 1.005–1.030)
Urobilinogen, UA: 0.2 mg/dL (ref 0.0–1.0)
pH: 6 (ref 5.0–8.0)

## 2010-04-15 LAB — APTT: aPTT: 26 seconds (ref 24–37)

## 2010-04-15 LAB — URINE MICROSCOPIC-ADD ON

## 2010-04-17 ENCOUNTER — Other Ambulatory Visit (HOSPITAL_BASED_OUTPATIENT_CLINIC_OR_DEPARTMENT_OTHER): Payer: Self-pay | Admitting: Internal Medicine

## 2010-04-17 DIAGNOSIS — Z1231 Encounter for screening mammogram for malignant neoplasm of breast: Secondary | ICD-10-CM

## 2010-04-25 NOTE — Discharge Summary (Signed)
Gloria [name], Gloria [name]              ACCOUNT NO.:  [id number]  MEDICAL RECORD NO.:  [id number]           PATIENT TYPE:  I  LOCATION:  5530                         FACILITY:  [facility]  PHYSICIAN:  Barry Dienes. Eloise Harman, M.D.DATE OF BIRTH:  [birth year]  DATE OF ADMISSION:  03/06/2010 DATE OF DISCHARGE:  03/09/2010                              DISCHARGE SUMMARY   PERTINENT FINDINGS:  The patient is a 77 year old Caucasian woman who has a long history of gastroparesis that has been documented on gastric emptying scans.  She had been treated with low-dose erythromycin and doing well with this.  In August 2011, she developed atrial fibrillation and was placed on flecainide and Pradaxa.  The erythromycin was stopped because of potential interactions with flecainide.  She subsequently developed progressive bloating and nausea with inability to eat, constipation, and weight loss.  She was recently restarted on erythromycin and Reglan for gastroparesis.  She was seen by her GI specialist on the day of admission and had complaints of extreme fatigue with nausea and vomiting and tremor.  She had not been able to keep any food or fluids down for several days and was feeling faint.  She also had been having a cough associated with recently starting lisinopril. She had some chills but no fever and no productive cough.  MEDICATIONS:  Prior to hospitalization: 1. Crestor 20 mg p.o. daily. 2. Omeprazole 40 mg p.o. twice daily. 3. Flecainide 50 mg p.o. twice daily. 4. Hydrochlorothiazide 25 mg p.o. daily. 5. Lisinopril 10 mg take one-half tablet p.o. daily. 6. Reglan 5 mg p.o. a.c. t.i.d. and nightly. 7. Erythromycin 250 mg p.o. t.i.d. 8. Xarelto 20 mg p.o. daily.  ALLERGIES:  PENICILLIN, CODEINE, and CEPHALOSPORINS.  PAST MEDICAL HISTORY:  Asymptomatic meningioma followed by Dr. Trey Sailors. This has been stable on CT scans and MRI scans.  Gastroparesis, gastroesophageal reflux, paroxysmal atrial  fibrillation, hyperlipidemia, osteoarthritis.  PAST SURGICAL HISTORY:  Hip replacement, knee replacement, colonoscopies, and endoscopies  See history and physical for details of family history and social history.  INITIAL PHYSICAL EXAM:  VITAL SIGNS:  Blood pressure 122/68, pulse 68, respirations 20, temperature 96.7, weight 150 pounds which was 8 pounds in the past 2 months. GENERAL:  The patient was pale and shaky and looked fatigued. HEAD, EYES, EARS, NOSE AND THROAT:  Within normal limits and there was no scleral icterus. CHEST:  Clear to auscultation. NECK:  Supple without jugular venous distention or carotid bruit. HEART:  Regular rate and rhythm without significant murmur. ABDOMEN:  Normal bowel sounds and no hepatosplenomegaly or tenderness.  HOSPITAL COURSE:  The patient was started on IV erythromycin and moderate dose IV fluids.  She had a chest x-ray that showed probable COPD with no evidence of acute cardiopulmonary disease.  She had electrolyte abnormalities with hypokalemia that was treated with replacement.  Per her request, Crestor was discontinued.  Gradually her symptoms improved and she was able to eat and drink normally.  Her EKG showed a sinus rhythm throughout.  PROCEDURES:  None.  COMPLICATIONS:  None.  CONDITION ON DISCHARGE:  She was alert, well oriented, eating and drinking regular food  without difficulty.  She no longer had any nausea and denied shortness of breath or abdominal pain.  Her vital signs were stable and she was afebrile.  Chest was clear to auscultation.  Heart had a regular rate and rhythm.  Abdomen had normal bowel sounds and no tenderness.  DISCHARGE DIAGNOSES: 1. Nausea, vomiting, and dehydration. 2. Gastroparesis. 3. Paroxysmal atrial fibrillation, currently in sinus rhythm. 4. Gastroesophageal reflux disease. 5. Hypertension. 6. History of meningioma. 7. High risk medications.  DISCHARGE MEDICATIONS: 1. Xarelto 20 mg p.o.  daily. 2. Lisinopril 20 mg take one-half tablet p.o. daily. 3. Hydrochlorothiazide 25 mg p.o. daily. 4. Os-Cal D 1 tablet p.o. twice daily. 5. Omeprazole 40 mg p.o. twice daily. 6. Flecainide 50 mg p.o. twice daily. 7. Erythromycin base 250 mg take 1 tablet p.o. t.i.d. and note that     Crestor 20 mg daily was discontinued.  DISPOSITION AND FOLLOWUP:  The patient was advised to schedule a followup visit with Dr. Jarome Matin in approximately 2 weeks following discharge.  She was advised to call [phone] to schedule that visit.          ______________________________ Barry Dienes. Eloise Harman, M.D.     DGP/MEDQ  D:  04/10/2010  T:  04/11/2010  Job:  [id number]  cc:   Peter M. [place], M.D. James L. Malon Kindle., M.D.  Electronically Signed by Jarome Matin M.D. on 04/25/2010 08:05:07 AM

## 2010-05-14 ENCOUNTER — Ambulatory Visit
Admission: RE | Admit: 2010-05-14 | Discharge: 2010-05-14 | Disposition: A | Discharge status: Home / Self Care | Payer: Medicare Other | Source: Ambulatory Visit | Attending: Internal Medicine | Admitting: Internal Medicine

## 2010-05-14 ENCOUNTER — Ambulatory Visit: Payer: Medicare Other

## 2010-05-14 DIAGNOSIS — Z1231 Encounter for screening mammogram for malignant neoplasm of breast: Secondary | ICD-10-CM

## 2010-05-22 NOTE — Discharge Summary (Signed)
NAMETAMU, GOLZ              ACCOUNT NO.:  [id number]   MEDICAL RECORD NO.:  [id number]          PATIENT TYPE:  INP   LOCATION:  5021                         FACILITY:  [facility]   PHYSICIAN:  Almedia Balls. Ranell Patrick, M.D. DATE OF BIRTH:  06-12-1930   DATE OF ADMISSION:  07/25/2008  DATE OF DISCHARGE:  07/28/2008                               DISCHARGE SUMMARY   ADMISSION DIAGNOSIS:  Right knee end-stage osteoarthritis.   DISCHARGE DIAGNOSIS:  Right knee end-stage osteoarthritis status post  total knee arthroplasty.   BRIEF HISTORY:  The patient is a 77 year old female with worsening right  knee pain secondary to osteoarthritis.  The patient is elected for  surgical management for pain relief and also improvement in function.   PROCEDURE:  The patient had right total knee arthroplasty by Dr. Malon Kindle on July 25, 2008.  Assistant was [employer] [facility].  No  complications.  General anesthesia with a femoral block were used.  Estimated blood loss was minimal.   HOSPITAL COURSE:  The patient was admitted on July 25, 2008, for the  above-stated procedure, which she tolerated well.  After adequate time  in post anesthesia care unit, she was transferred to 5000.  On postop  day #1, the patient complained of moderate pain to the right knee, but  was able to work with physical therapy over the next several days quite  well.  Labs were within acceptable limits.  Neurovascularly, she was  intact.  Wound was healing well.  She continued to improve with physical  therapy and thus was discharged home on July 29, 2008.  Neurovascularly,  she remained intact.   DISCHARGE PLAN:  The patient will be discharged to home on July 29, 2008.  Her condition is stable.  Her diet is regular.   DISCHARGE MEDICATIONS:  1. Crestor 20 mg daily.  2. Omeprazole 20 mg daily.  3. Lisinopril/hydrochlorothiazide 5/6.25 mg daily.  4. Erythromycin 250 mg b.i.d.  5. Os-Cal 600 mg daily.  6. Coumadin per pharmacy  protocol.  7. Robaxin 500 mg p.o. q.6 h.  8. Percocet 5/325 one to two tablets q.4-6 h p.r.n. pain.   FOLLOWUP:  The patient will follow up with Dr. Malon Kindle in 2 weeks.      Thomas B. Durwin Nora, P.A.      Almedia Balls. Ranell Patrick, M.D.  Electronically Signed    TBD/MEDQ  D:  08/30/2008  T:  08/30/2008  Job:  [id number]

## 2010-05-22 NOTE — H&P (Signed)
Gloria [name], Gloria [name]              ACCOUNT NO.:  [id number]   MEDICAL RECORD NO.:  [id number]          PATIENT TYPE:  INP   LOCATION:  NA                           FACILITY:  [facility]   PHYSICIAN:  Almedia Balls. Ranell Patrick, M.D. DATE OF BIRTH:  12-27-1930   DATE OF ADMISSION:  DATE OF DISCHARGE:                              HISTORY & PHYSICAL   CHIEF COMPLAINTS:  Right knee pain.   HISTORY OF PRESENT ILLNESS:  The patient is a 77 year old female with  worsening right knee pain secondary to refractory symptoms.  The patient  elected to have a total knee arthroplasty by Dr. Malon Kindle.   PAST MEDICAL HISTORY:  Hypertension, hyperlipidemia, and GERD.   FAMILY HISTORY:  Negative.   SOCIAL HISTORY:  Patient of Dr. Jarold Motto.  Does not smoke or use  alcohol.   DRUG ALLERGIES:  Amoxicillin, Keflex, and CODEINE.   CURRENT MEDICATIONS:  1. Crestor 20 mg daily.  2. Omeprazole 20 mg daily.  3. Lisinopril/hydrochlorothiazide 5/6.25 mg daily.  4. Erythromycin 250 mg b.i.d.  5. Os-Cal 600 mg daily.  6. Bayer aspirin 81 mg daily.   REVIEW OF SYSTEMS:  Pain with range of motion and weightbearing.   PHYSICAL EXAMINATION:  VITAL SIGNS:  Pulse 60, respirations 16, blood  pressure 130/80.  GENERAL:  The patient is a healthy-appearing 77 year old female in no  acute distress.  Pleasant mood and affect.  Alert and oriented x3.  HEAD AND NECK:  Cranial nerves II through XII grossly intact.  She has  full range of motion of cervical spine without any tenderness.  LUNGS:  Breath sounds are equal bilaterally.  No wheezes, rhonchi, or  rales.  HEART:  Regular rate and rhythm.  No murmur.  ABDOMEN:  Nontender, nondistended with active bowel sounds.  EXTREMITIES:  Marked tenderness at the right knee.  SKIN:  No edema or rashes.  NEUROLOGIC:  She is intact.   X-rays show end-stage osteoarthritis of the right knee pain.   IMPRESSION:  Right knee end-stage osteoarthritis.   PLAN OF ACTION:  Right total  knee arthroplasty by Dr. Malon Kindle.      Thomas B. Durwin Nora, P.A.      Almedia Balls. Ranell Patrick, M.D.  Electronically Signed    TBD/MEDQ  D:  07/14/2008  T:  07/14/2008  Job:  [id number]

## 2010-05-22 NOTE — Discharge Summary (Signed)
Gloria [name], Gloria [name]              ACCOUNT NO.:  [id number]   MEDICAL RECORD NO.:  [id number]          PATIENT TYPE:  INP   LOCATION:                               FACILITY:  [facility]   PHYSICIAN:  Almedia Balls. Ranell Patrick, M.D.      DATE OF BIRTH:   DATE OF ADMISSION:  DATE OF DISCHARGE:                               DISCHARGE SUMMARY   ADMISSION DIAGNOSIS:  Right hip end-stage osteoarthritis.   DISCHARGE DIAGNOSIS:  Right hip end-stage osteoarthritis.   BRIEF HISTORY:  The patient is a 77 year old female with worsening right  hip pain status post osteoarthritis.  The patient has elected to have  surgical management for this right hip.   PROCEDURE:  The patient had a right total hip replacement using DePuy  Trilock prosthesis by Dr. Malon Kindle on January 07, 2007.  Assistant  was [employer], PA-C.  General anesthesia was used.  Estimated blood  loss was 300 mL.  Fluid replacement was 1800 mL.  No complications.   HOSPITAL COURSE:  The patient was admitted on January 07, 2007, for the  above stated procedure which she tolerated well.  After adequate time in  post anesthesia care unit she was transferred up to 5000.  Postop day #1  the patient was sitting up quite comfortably in a chair, complaining  about only minimal pain to the right hip.  Neurovascularly she was  intact.  Distally she was able to complete some gentle range of motion  and physical therapy requirements.  Postop day #2 the patient was  continued to improve with very good range of motion and was able to walk  about 20 feet with PT's help and rolling walker.  Neurovascularly she  was intact.  Her labs were within normal acceptable limits although she  had some mild postop anemia but no signs of cellulitis, erythema or  infection.  The right hip dressing changes were applied in nursing.  The  patient has allergies to AMOXICILLIN, CEPHALOSPORINS and CODEINE.  Her  condition is stable.  Her diet is regular.   DISCHARGE  MEDICATIONS:  Erythromycin 250 mg p.o. t.i.d., ferrous sulfate  325 mg p.o. t.i.d., Coumadin per pharmacy protocol, Vicodin 1 to 2 tabs  q.4-6 h p.r.n. pain, Robaxin 500 mg one tab p.o. q.6 h.   FOLLOWUP:  The patient will follow back up with Dr. Malon Kindle in 2  weeks.  The patient will be discharged to a skilled nursing facility  once a bed is available.     Thomas B. Durwin Nora, P.A.      Almedia Balls. Ranell Patrick, M.D.  Electronically Signed   TBD/MEDQ  D:  01/09/2007  T:  01/09/2007  Job:  [id number]   cc:   Almedia Balls. Ranell Patrick, M.D.

## 2010-05-22 NOTE — Op Note (Signed)
NAMEKRISLYNN, Gloria [name]              ACCOUNT NO.:  [id number]   MEDICAL RECORD NO.:  [id number]          PATIENT TYPE:  OIB   LOCATION:  5120                         FACILITY:  [facility]   PHYSICIAN:  Zola Button T. Lazarus Salines, M.D. DATE OF BIRTH:  [birth year]   DATE OF PROCEDURE:  10/07/2007  DATE OF DISCHARGE:                               OPERATIVE REPORT   PREOPERATIVE DIAGNOSIS:  Right parotid deep lobe tumor.   POSTOPERATIVE DIAGNOSIS:  Right parotid deep lobe tumor.   PROCEDURE PERFORMED:  Right total parotidectomy with facial nerve  preservation.   SURGEON:  Gloris Manchester. Lazarus Salines, MD.   Threasa HeadsEnrigue Catena H. Pollyann Kennedy, MD   ANESTHESIA:  General nasotracheal.   BLOOD LOSS:  Less than 25 mL.   COMPLICATIONS:  None.   FINDINGS:  A bulky multilobulated tumor in the deep lobe of the parotid,  roughly 6 x 5 x 3 cm and tenting up the facial nerve at its main trunk  and at the pes.  The nerve itself was quite attenuated until the distal  branches consistent with stretching.   PROCEDURE:  With the patient in a comfortable supine position, general  nasotracheal anesthesia was induced without difficulty.  At an  appropriate level, the patient was placed in a slight reverse  Trendelenburg.  The head was rotated towards the left for access to the  right neck.  The neck was examined.  The identifying initials were  noted.  The proposed incision was infiltrated with 1% Xylocaine with  1:100,000 epinephrine, 6 mL total.  A sterile preparation and draping of  the right face and neck were accomplished.   A modified Blair incision was made after first crosshatching at the  level of the lobule and across the transverse portion of the neck  incision.  This was carried down through the skin and subcutaneous fat  using a sharp 15 blade scalpel.  The anterior flap was elevated in the  periparotid fascia using the Shaw scalpel and retained with 2-0 silk  stitches.  A posterior flap was elevated and also retained  with 2-0 silk  stitches.  Working in a broad front from the ear canal down to the neck,  the great auricular nerve was identified and the posterior portion was  preserved.  The soft tissue was rolled forward off the  sternocleidomastoid muscle and around the anterior edge, at which point  the digastric muscle was identified.  Working down along the ear canal,  the cartilaginous pointer was identified.  A Shaw scalpel was used for  this.  Working further downward, small vessels were controlled with a  Shaw scalpel.  Finally with blunt dissection, a small main trunk of the  facial nerve was identified.  There were no other structures consistent  with the facial nerve.  Working forward with a hemostat and then with  the elevators, the dissection was carried down the main trunk and when  the first branch of the upper trunk was identified, then the parotid  tissue was rolled forward from the ear canal.  The dissection was  carried out in the standard fashion  working along each branch to the  periphery and then rolling specimen downward and going to the neck  specimen.  The pes was lying right on top of the tumor which was bulky,  but not hard.  The nerve branches were carried forward in the periphery  and gland was rolled downward.  It remained pedicled by the tail of the  parotid gland inferiorly where the inferior portion of the tumor also  was.   Working forward off the sternocleidomastoid muscle and digastric muscle,  the gland and tumor were carried forward and then the dissection was  carried up along the tip of the mastoid to the main trunk once again.   Upon freeing gland around this periphery, the facial nerve was carefully  dissected from the surface of the tumor and delicately retracted  superiorly.  The tumor was dissected off the mass of the muscle and more  deeply the medial pterygoid muscle.  Branches of the superficial  temporal vein were controlled with silk ligature.   During the  parotidectomy itself, hemostasis was achieved with bipolar cautery, Shaw  scalpel, and the silk ligature as required.  The superficial temporal  artery was identified and was dissected off the medial face of the  tumor.  Working from inferiorly upward and finally from superiorly  downward beneath the nerve, the tumor was dissected from the deep lobe  area and delivered.  There was an area of avulsion of the lateral tumor,  which did extrude some tumor which was carefully protected and taken off  the field.  Upon removing the tumor completely, personnel involved  changed gloves.  The tumor was sent for permanent extirpation.   The wound was thoroughly irrigated with approximately 300 mL of sterile  saline.  A small amount of hemostasis was achieved with bipolar cautery.  With Valsalva, there was no additional bleeding noted.  Facial nerve was  intact and lay well in the bed.   At this point, a 10-French round drain was placed using a sharp trocar  and brought out the neck skin below the wound and laid into the bed.  It  was secured at the skin with a 3-0 nylon stitch.  Again, hemostasis was  observed.  The flaps were reapproximated with interrupted 4-0 chromic  gut in the subcutaneous layer.  Finally, the skin was closed in a  running simple fashion for a cosmetic closure using 5-0 Ethilon.  The  drain was functional and sealed properly.  At this point, the procedure  was completed.  The drapes were unstable, then removed.  A small amount  of bacitracin ointment was applied along the wound line.  The patient  was awakened, extubated, and transferred to the recovery in stable  condition.   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  Anticipate routine postoperative recovery with   attention to ice, elevation, analgesia, and routine wound hygiene.  I  anticipate that she will have facial weakness for some time and will  practice appropriate eye precautions.      Gloris Manchester. Lazarus Salines, M.D.  Electronically Signed     KTW/MEDQ  D:  10/07/2007  T:  10/08/2007  Job:  [id number]   cc:   Peter M. [place], M.D.  Barry Dienes Eloise Harman, M.D.

## 2010-05-22 NOTE — Op Note (Signed)
NAMELATUNYA, KISSICK              ACCOUNT NO.:  [id number]   MEDICAL RECORD NO.:  [id number]          PATIENT TYPE:  INP   LOCATION:  5021                         FACILITY:  [facility]   PHYSICIAN:  Almedia Balls. Ranell Patrick, M.D. DATE OF BIRTH:  January 03, 1931   DATE OF PROCEDURE:  07/25/2008  DATE OF DISCHARGE:                               OPERATIVE REPORT   PREOPERATIVE DIAGNOSES:  Right knee end-stage osteoarthritis.   POSTOPERATIVE DIAGNOSIS:  Right knee end-stage osteoarthritis.   PROCEDURE PERFORMED:  Right total knee replacement using DePuy Sigma  rotating-platform prosthesis.   ATTENDING SURGEON:  Almedia Balls. Ranell Patrick, M.D.   ASSISTANT:  Donnie Coffin. Durwin Nora, P.A.   ANESTHESIA:  General anesthesia plus femoral block anesthesia was used.   ESTIMATED BLOOD LOSS:  Minimal.   FLUID REPLACEMENT:  1500 mL of crystalloid.   URINE OUTPUT:  200 mL.   TOURNIQUET TIME:  1 hour 30 minutes at 300 mmHg.   INSTRUMENT COUNTS:  Correct.   COMPLICATIONS:  There were no complications.   Perioperative antibiotics were given.   INDICATIONS:  The patient is a 77 year old female with worsening right  knee pain following a knee arthroscopy.  The patient was noted to have  arthroscopy to have a large lateral femoral condyle, osteochondritis  dissecans, and essentially a very deep 7 mm or so, 1.5 cm x 3 cm portion  of condyle was dead.  We did try a chondroplasty in that area to clean  the knee up, unfortunately postoperatively she had worsening pain  despite conservative measures including injections and activity  modifications.  The patient presents now for total knee arthroplasty to  restore the condylar anatomy of her knee and to eliminate pain and  restore her function.  Informed consent was obtained.   DESCRIPTION OF PROCEDURE:  After an adequate level of anesthesia was  achieved, the patient was positioned supine on the operating room table.  The right leg correctly identified and a nonsterile  tourniquet was  placed on the proximal thigh.  The right leg was sterilely prepped and  draped in the usual manner.  After elevation and exsanguination of the  limb using an Esmarch bandage, after the sterile prep and drape, we then  went ahead and made our knee incision in the midline in flexion.  Dissection was carried sharply down through subcutaneous tissues.  We  performed a medial parapatellar arthrotomy dividing the lateral  parapatellar femoral ligaments and everting the patella.  We then  entered the distal femur using a step cut drill and then used a  intramedullary distal femoral resection guide resecting 10 mm set on 5  degrees right.  We next went ahead and performed our anterior,  posterior, and chamfer cuts off four-in-one block.  We incised the femur  to a size 3 and then made our cuts.  Once these cuts were done we  removed ACL, PCL meniscal tissues, subluxed the tibia anteriorly and  performed our tibial cuts 90 degrees perpendicular along axis of the  tibia with zero degrees posterior slope for this  posterior cruciate-  substituting prosthesis.  At this time  point, we went ahead and checked  out flexion and extension gaps and these were symmetric at 10 mm.  We  then went ahead and removed excess posterior bone using a laminar  spreader and 3/4-inch curved osteotome.  Releasing the rest of the  posterior capsule from the notch.  At this point, we went ahead and  incised her tibia to size 3 and then used a modular drill and keel punch  to finish the tibial preparation and then we cut a box cut for the  posterior cruciate substituting femoral prosthesis.  Once we had a box  cut done, we went ahead and placed our femoral component on this is a 3  right femur and a 3 tibia with a 12.5 insert and we were able to get  full extension and good flexion stability with that 12.5 insert.  We  next drilled surface of patella starting at 24 mm thickness going down  to 16 mm of  thickness and having a 35 button.  We then checked, drilled  our lugs, placed the button in place, and then ranged the knee with good  no touch or no finger technique.  We are able to have normal patellar  tacking.  The knee easily flexed to gravity to 120 degrees.  At this  point, we checked the varus, valgus stability, which was excellent.  Anterior, posterior stability, which was excellent.  We removed all  trial components.  Plugged the end of femur with the available bone.  Thoroughly pulse irrigated the femur and tibia and patella and then  cemented the components into place using DePuy high-viscosity cement.  Once the cement was allowed to harden, we removed the excess cement  using 1/4-inch curved osteotome.  We trailed again with 12.5.  We are  happy with that both extension and flexion and then we went ahead and  removed that trial component and then inserted the real 12.5 insert.  We  are happy with our range of motion and our stability.  We then  thoroughly pulse irrigated the knee, and then closed the parapatellar  arthrotomy using interrupted #1 Vicryl suture in a figure-of-eight  fashion followed by 0 and 2-0 layered subcutaneous closure with Vicryl  and then 4-0 running Monocryl for skin and Steri-Strips applied followed  by sterile dressing.  The patient tolerated the surgery well.      Almedia Balls. Ranell Patrick, M.D.  Electronically Signed     SRN/MEDQ  D:  07/25/2008  T:  07/26/2008  Job:  [id number]

## 2010-05-22 NOTE — Discharge Summary (Signed)
Gloria [name], Gloria [name]              ACCOUNT NO.:  [id number]   MEDICAL RECORD NO.:  [id number]          PATIENT TYPE:  INP   LOCATION:  5037                         FACILITY:  [facility]   PHYSICIAN:  Maisie Fus B. Dixon, P.A.  DATE OF BIRTH:  [birth year]   DATE OF ADMISSION:  01/07/2007  DATE OF DISCHARGE:  01/12/2007                               DISCHARGE SUMMARY   ADMISSION DIAGNOSIS:  Right hip end-stage osteoarthritis.   DISCHARGE DIAGNOSIS:  Right hip end-stage osteoarthritis.   BRIEF HISTORY:  The patient is a 77 year old female with worsening right  hip pain secondary to osteoarthritis.  Patient has elected to have a  right total hip arthroplasty by Dr. Malon Kindle.   PROCEDURE:  The patient had a right total hip arthroplasty by Dr. Malon Kindle on January 07, 2007.  Assistant was [employer], PA-C. General  anesthesia was used.  Estimated blood loss was 100 mL and no  complications.   HOSPITAL COURSE:  The patient was admitted on the chart January 07, 2007, with the above-stated procedure which she tolerated well.  After  adequate time in the post-anesthesia care unit, she was transferred up  to 5000.  Postop day 1, the patient said she had some mild tenderness to  the right hip, but otherwise was sitting up comfortably.  No signs of  cellulitis, erythema or infection of the wound.  No drain was used  during surgery, so her dressing was kept in place.  Postop day 2,  patient was able to continue working with physical therapy, was able to  walk several steps, actually worked quite well.  The patient was kept  over the weekend for skilled nursing facility placement and Medicare  requirements.  Her dressing was intact.  Her labs were within acceptable  limits.  She did have the mild atelectasis.  Her temperature on went up  to about 100, which low-grade, but no signs of any other occult  infection.   DISCHARGE PLAN:  The patient will be discharged to a skilled nursing  facility on January 12, 2007.  Her admission date is January 07, 2008,  discharge date is January 12, 2007.   The patient has allergies to:  1. AMOXICILLIN.  2. CEPHALOSPORINS.  3. CODEINE.   DISCHARGE MEDICATIONS:  1. Aspirin 81 mg p.o. q. day.  2. Lipitor 40 mg p.o. q. day.  3. Zebeta 5 mg p.o. q. day.  4. Erythromycin 250 mg p.o. t.i.d.  5. Iron 325 mg p.o. t.i.d.  6. Hydrochlorothiazide 6.25 mg p.o. q. day.  7. Protonix 40 mg p.o. q. day.  8. Coumadin per pharmacy protocol.  9. Vicodin 1-2 tabs q.4-6 hours p.r.n. pain.  10.Robaxin 500 mg 1 tablet p.o. q.6 hours.   FOLLOWUP:  Patient will follow back up with Dr. Malon Kindle.   CONDITION:  Stable.   DIET:  Regular.   ACTIVITY:  Weightbearing as tolerated on the right lower extremity with  a rolling walker.      Thomas B. Ferne Coe.     TBD/MEDQ  D:  01/12/2007  T:  01/12/2007  Job:  [id number]

## 2010-05-22 NOTE — Op Note (Signed)
NAMEMALINA, GEERS              ACCOUNT NO.:  [id number]   MEDICAL RECORD NO.:  [id number]          PATIENT TYPE:  INP   LOCATION:  5037                         FACILITY:  [facility]   PHYSICIAN:  Almedia Balls. Ranell Patrick, M.D. DATE OF BIRTH:  05/06/1930   DATE OF PROCEDURE:  01/07/2007  DATE OF DISCHARGE:                               OPERATIVE REPORT   PREOPERATIVE DIAGNOSIS:  Right hip end stage osteoarthritis.   POSTOPERATIVE DIAGNOSIS:  Right hip end stage osteoarthritis.   PROCEDURE PERFORMED:  Right total hip replacement using DePuy Trilock  prosthesis.   SURGEON:  Almedia Balls. Ranell Patrick, M.D.   ASSISTANT:  Donnie Coffin. Dixon, P.A.-C.   ANESTHESIA:  General.   ESTIMATED BLOOD LOSS:  300 mL.   FLUID REPLACEMENT:  1800 mL crystalloid.   URINE OUTPUT:  350 mL.   COUNTS:  Instrument count was correct.   COMPLICATIONS:  None.   Preoperative antibiotics given.   INDICATIONS:  The patient is a 77 year old female who has a history of  end stage arthritis of the right hip.  She has had a prior left hip  hemiarthroplasty done for a fracture.  She presents now for operative  treatment for her arthritic right hip with debilitating hip pain.  She  has failed conservative management and desires operative treatment.  Informed consent was obtained.   DESCRIPTION OF PROCEDURE:  After an adequate level of anesthesia was  achieved, the patient was positioned in the left lateral decubitus  position with the right hip up. The right hip and thigh and leg were  prepped and draped in their entirety in the usual sterile manner.  We  performed a Kocher-Langenbeck posterior approach to the hip starting at  the vastus ridge and extending along the gluteus maximus. Dissection was  carried sharply down through subcutaneous tissues.  The tensor fascia  lata was split in line with its fibers.  I identified the short external  rotators and divided those sharply off the posterior aspect of the  femur.  The  gluteus medius was retracted. We then identified the hip  capsule and divided that and the piriformis, as well, dislocating the  hip.  We performed a neck cut one fingerbreadth above the lesser  trochanter.  We then went ahead and retracted the hip anteriorly.  We  performed an excision of the patient's labrum. The patient did have end  stage arthritis of the hip.  We then performed sequential reaming up to  a size 51 to accept a 52 cup.  We trialed that first and went ahead and  placed our [employer] cup and impacted that firmly into place.  We  had good coverage and excellent fit.  We placed two screws proximally  into the ilium and gained secure fit with those screws.  We then placed  a trial +4 10 degrees liner.  We then went ahead and broached up to a  size 3 Trilock prosthesis from DePuy, placed the +1.5 head ball which is  36 mm in diameter in place, and then reduced the hip.  We had excellent  stability, excellent length,  and recreation of leg length. We went ahead  and shaved just a little bit of shuck. She was nice and stable in  flexion with internal rotation.  We then went ahead and removed our  trial components.  We went ahead and placed the manhole cover on the  socket and placed the real +4 10 degrees liner with the lip posterior  superior. At this point, we went ahead and placed the real [employer] prosthesis with about 20 degrees of anteversion, impacted that in  place with a good fit, retrialed with the +1.5 head ball, and again had  nice leg length recreation and good soft tissue balancing. We had a  little bit of soft tissue impingement anteriorly.  I did remove the  anterior capsule and that helped a little bit. She did not impinge until  she was up to 90 degrees and crossed her leg significantly and  internally rotated about 45 degrees but she did lever out a little bit,  but it was definitely better after removing some of the anterior hip  capsule.  She  was stable the remainder of her ranges of motion and she  had nice tight abductors with her leg in extension, basically had  minimal to no shuck.  The patient then was thoroughly irrigated. The  real +1.5 36 head ball was placed. The hip was reduced, nice and stable.  I went ahead and closed the short external rotators to the gluteus  medius and then went ahead and closed the tensor fascia lata using  interrupted #1 Vicryl suture followed by layered closure of the  subcutaneous tissues with 0 followed by 2-0 Vicryl followed by 4-0  Monocryl for skin.  Steri-Strips and sterile dressing, which was a  Mepilex dressing, was applied.  The patient tolerated the surgery well.      Almedia Balls. Ranell Patrick, M.D.  Electronically Signed     SRN/MEDQ  D:  01/07/2007  T:  01/07/2007  Job:  [id number]

## 2010-05-22 NOTE — H&P (Signed)
NAMEMARGAUX, Gloria [name]              ACCOUNT NO.:  [id number]   MEDICAL RECORD NO.:  [id number]          PATIENT TYPE:  INP   LOCATION:  1338                         FACILITY:  [facility]   PHYSICIAN:  Barry Dienes. Eloise Harman, M.D.DATE OF BIRTH:  19-Jan-1930   DATE OF ADMISSION:  08/29/2008  DATE OF DISCHARGE:                              HISTORY & PHYSICAL   CHIEF COMPLAINT:  Nausea and vomiting.   HISTORY OF PRESENT ILLNESS:  The patient is a 77 year old white female  who is well-known to me.  She has a long history of gastroesophageal  reflux disease and gastroparesis.  She is on erythromycin chronically  for gastroparesis, which has worked quite well for her.  For the past  week, she has had persistent nausea and epigastric pain with some  vomiting of food-like material once daily.  She denies constipation,  diarrhea, or rectal bleeding.  Her bowel movements are dark brown but  not black, and she has not had dizziness.  On August 16, she had a urine  culture that grew out greater than 100,000  Escherichia fergusonii.  She  was treated with ciprofloxacin for this, which was associated with  nausea after the first dose, so she was switched to Macrobid, which the  organism was sensitive to.  She also has a history of hypertension, C.  difficile colitis in 2001, atypical chest pain with 2002 adenosine  Cardiolite test normal, osteopenia, chronic neck pain from  osteoarthritis, left knee pain, hyperlipidemia.   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   CURRENT MEDICATIONS:  1. Erythromycin 250 mg p.o. t.i.d.  2. Crestor 20 mg once daily.  3. Aspirin 81 mg daily.  4. OsCal  500 mg twice daily.  5. Omeprazole 20 mg daily.  6. Bisoprolol/HCTZ 5/6.25 one tablet every a.m.  7. Phenazopyridine 200 mg p.o. twice daily.  8. Promethazine 25 mg p.o. daily p.r.n. constipation.  9. Nitrofurantoin 100 mg p.o. twice daily.   SOCIAL HISTORY:  She is married and is retired.  She has no history of  tobacco or alcohol abuse.   FAMILY HISTORY:  Father died at age 10 of coronary artery disease.  Her  mother is in her 104s and has had coronary artery disease and multiple GI  problems.  A brother had coronary artery bypass graft surgery at age 26,  and she has 2 other brothers.  She has 2 daughters.   REVIEW OF SYSTEMS:  She has not had recent fever or chills and has not  had change in her vision or headache.  She denies chest pain,  palpitations, difficulty breathing, cough, rectal bleeding, difficulty  voiding, claudication, headaches, anxiety, or depression.   INITIAL PHYSICAL EXAMINATION:  VITAL SIGNS:  Blood pressure 162/82,  pulse 56, respirations 22, temperature 98.2.  Weight 160 pounds.  GENERAL:  She is a well-nourished, well-developed white female who has  malaise and a pale complexion.  HEENT:  Exam was within normal limits, and she did not have scleral  icterus.  NECK:  Supple without jugular venous distention or carotid bruit.  CHEST:  Clear to auscultation.  HEART:  Regular rate and rhythm without significant murmur or gallop.  ABDOMEN:  Had normal bowel sounds and no hepatosplenomegaly or  tenderness.  EXTREMITIES:  She had normal bilateral pedal pulses.  There was no  significant edema of the legs.  NEUROLOGIC:  Significant solely for a mild right-sided facial droop,  which she has had since her parotid gland surgery.   LABORATORY STUDIES:  Serum lipase 22.  Serum amylase 72.  Serum sodium  130, potassium 3.1, chloride 107, carbon dioxide 25, BUN 10, creatinine  0.56, glucose 92, total protein 5.2, albumin 3.6.  White blood cell  count 7.0, hemoglobin  12.6, hematocrit 38, platelets 198.   IMPRESSION AND PLAN:  1. Nausea and vomiting:  It is unclear what the etiology of this is.      Her symptoms are suggestive of a partial small-bowel obstruction.      Other etiologies could be worsening gastroparesis, constipation, or      occult urinary tract infection.  We will obtain a CT scan of the      abdomen and pelvis with oral and IV contrast for further      evaluation.  She will also receive IV fluids as she appears to be      intravascularly volume depleted.  If the CT scan shows no revealing      findings to suggest the cause of her nausea and vomiting, then we      will ask the opinion of a GI consultant.  2. Hypertension:  Well controlled on current medications.  3. Gastroesophageal reflux disease:  Again well controlled on proton      pump inhibitor treatment.           ______________________________  Barry Dienes. Eloise Harman, M.D.     DGP/MEDQ  D:  08/30/2008  T:  08/30/2008  Job:  [id number]

## 2010-05-22 NOTE — Consult Note (Signed)
NAMEZENAIDA, TESAR NO.:  [id number]   MEDICAL RECORD NO.:  [id number]          PATIENT TYPE:  EMS   LOCATION:  MAJO                         FACILITY:  [facility]   PHYSICIAN:  Peter M. [place]il[place], M.D.  DATE OF BIRTH:  July 28, 1930   DATE OF CONSULTATION:  11/10/2006  DATE OF DISCHARGE:                                 CONSULTATION   CARDIOLOGY CONSULTATION   HISTORY OF PRESENT ILLNESS:  Ms. Volk is a 77 year old white female  who has a history of hypertension, hypercholesterolemia who presented to  emergency department day for evaluation of chest pain.  She has no prior  cardiac history.  She does report having an adenosine Cardiolite study  several years ago that was negative.  This morning she awoke suddenly,  at 3:00 a.m., with complaints of her heart pounding.  She then developed  a substernal chest pressure in the center of her chest without  radiation.  She denied any dyspnea, diaphoresis, nausea, or vomiting.  She checked her blood pressure with a home blood pressure monitor and  blood pressure was elevated to 174-180 systolic, pulse rate was also  noted to be elevated anywhere from 100-144 beats per minute which was  quite unusual for her since her normal pulse rate is very slow.  She  states her symptoms lasted approximately 2 hours, and then she got up at  5:00 a.m.  Her symptoms resolved.  She is feeling well now.   PAST MEDICAL HISTORY:  1. Hypertension.  2. Dyslipidemia.  3. History of gastroparesis/esophageal dysmotility/GERD.  4. Status post left total hip replacement November 2007.  5. Severe arthritis in her right hip.   ALLERGIES:  She is allergic to AMOXICILLIN, CEPHALEXIN, CODEINE, and  REGLAN.   CURRENT MEDICATIONS INCLUDE:  1. Bisoprolol/HCT 5/6.25 mg daily.  2. Erythromycin 250 mg t.i.d.  3. Nexium 40 mg b.i.d.  4. Crestor 20 mg per day.  5. Aspirin 81 mg daily.  6. Os-Cal b.i.d.   SOCIAL HISTORY:  The patient is married.  She has a  remote history of  tobacco use.  She denies alcohol use.   FAMILY HISTORY:  Mother died at age 25, she did have previous bypass  surgery.  One brother at age 67 has had bypass surgery.   REVIEW OF SYSTEMS:  She denies any edema, orthopnea, or PND.  She has no  history of stroke or TIA.  She reports generally her blood pressure has  been under good control.  She has no history of diabetes mellitus.  Other review of systems are negative.   PHYSICAL EXAMINATION:  GENERAL:  The patient is a pleasant white female  in no distress.  VITAL SIGNS:  Blood pressure 150/87, pulse 47, sinus rhythm.  She is  afebrile.  Saturation 98%.  HEENT EXAM:  She is normocephalic, atraumatic.  Pupils equal, round, and  reactive.  Conjunctivae are clear.  Oropharynx is clear.  She has no  JVD, adenopathy, thyromegaly or bruits.  LUNGS:  Clear to auscultation percussion.  CARDIAC:  Exam reveals regular rate and rhythm without murmur, rub,  gallop, or  click.  ABDOMEN:  Soft and nontender without masses or  hepatosplenomegaly.  Femoral and pedal pulses are 2+ and symmetric.  She  has no edema or phlebitis.  NEUROLOGIC:  Exam is nonfocal.   LABORATORY DATA:  ECG shows sinus bradycardia with voltage criteria for  LVH, otherwise no acute change.  Chest x-ray shows no active disease.  Sodium 138, potassium 3.9, chloride 107, CO2 24, BUN 15, creatinine 0.7,  glucose is 100.  White count 6900, hemoglobin 13.6, hematocrit 40.5,  platelets 218,000.  Cardiac enzymes were negative x1.   IMPRESSION:  1. Chest pain associated with tachy palpitations.  Suspect possible      primary arrhythmia such as paroxysmal atrial fibrillation, now      resolved.  Also need to consider possible anginal attack.  2. Hypertension.  3. Hypercholesterolemia.  4. Family history of coronary disease.  5. Status post left total hip replacement and severe arthritic disease      in her right hip.  6. History of esophageal and gastric  dysmotility disorder.   PLAN:  I think the patient is stable, at this point.  We will send her  home from the emergency department; but, however, come to our office  this afternoon for placement of an event monitor.  We will also schedule  her for an adenosine Cardiolite study this week.  She is to call if she  has any recurrent chest pain.           ______________________________  Peter M. [place], M.D.     PMJ/MEDQ  D:  11/10/2006  T:  11/11/2006  Job:  [id number]   cc:   Barry Dienes. Eloise Harman, M.D.  Almedia Balls. Ranell Patrick, M.D.

## 2010-05-25 NOTE — Op Note (Signed)
NAMELATRESA, [name]              ACCOUNT NO.:  [id number]   MEDICAL RECORD NO.:  [id number]          PATIENT TYPE:  AMB   LOCATION:  ENDO                         FACILITY:  [facility]   PHYSICIAN:  Vania Rea. Jarold Motto, M.D. [facility] OF BIRTH:  Jan 10, 1930   DATE OF PROCEDURE:  01/31/2004  DATE OF DISCHARGE:  01/31/2004                                 OPERATIVE REPORT   PROCEDURE:  Esophageal manometry.   RESULTS:  1.  Upper esophageal sphincter pressures are entirely within normal limits.  2.  Lower esophageal sphincter. Mean pressure in the lower esophageal      sphincter is only 10 mmHg with normal relaxation and swallowing.  3.  Motility pattern.  They are normally propagated peristaltic waves      throughout the length of the esophagus with normal peristalsis.  Mean      amplitude of contractions approximately 55 mmHg.   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  Peristalsis is normal but there is diminished amplitude of  contractions noted. This tracing is consistent with chronic gastroesophageal  reflux problems.      DRP/MEDQ  D:  02/03/2004  T:  02/03/2004  Job:  [id number]

## 2010-05-25 NOTE — Op Note (Signed)
Gloria [name], Gloria [name]                        ACCOUNT NO.:  [id number]   MEDICAL RECORD NO.:  [id number]                   PATIENT TYPE:  AMB   LOCATION:  DAY                                  FACILITY:  [facility]   PHYSICIAN:  Ronald A. Darrelyn Hillock, M.D.             DATE OF BIRTH:  08-15-1930   DATE OF PROCEDURE:  10/05/2002  DATE OF DISCHARGE:                                 OPERATIVE REPORT   PREOPERATIVE DIAGNOSES:  1. Complex severe tear of the lateral meniscus, left knee.  2. Degenerative arthritis, left knee.   POSTOPERATIVE DIAGNOSES:  1. Complex severe tear of the lateral meniscus, left knee.  2. Degenerative arthritis, left knee.   OPERATION:  1. Diagnostic arthroscopy, left knee.  2. Synovectomy, left knee.  3. Abrasion chondroplasty, medial femoral condyle, left knee.  4. Lateral meniscectomy, left knee.   SURGEON:  Georges Lynch. Darrelyn Hillock, M.D.   ASSISTANT:  Nurse.   DESCRIPTION OF PROCEDURE:  Under local anesthesia standby, routine  orthopedic prep and draping of the left knee was carried out.  She had 1 g  of IV Ancef.  At this time a small punctate incision was made in the  suprapatellar pouch.  The inflow cannula was inserted and the knee was  distended with saline.  Another small punctate incision was made in the  anterolateral joint.  The arthroscope was entered.  A complete diagnostic  arthroscopy was carried out.  I did a synovectomy in the suprapatellar  pouch, also in the medial joint space utilizing the shaver suction device.  The medial meniscus was intact, but she had obvious severe arthritic changes  in the medial femoral condyle.  I introduced a shaver suction device and did  an abrasion chondroplasty.  Following this I went over the lateral joint  space.  She had a severe complex tear of the lateral meniscus.  I entered  the scope now from the medial approach and entered the shaver suction device  from the lateral approach and did a complete lateral  meniscectomy.  I  thoroughly irrigated out the knee.  Following this I closed all three  punctate incisions with 3-0 nylon suture.  I injected 20 mL of 0.5% Marcaine  with epinephrine into the knee joint and a sterile Neosporin bundle dressing  was applied.  She left the operating room in satisfactory condition with a  Easy-Wrap ice pack to her left knee.  Postop she will be on Bufferin one  twice a day as an anticoagulant and she will be on Percocet 10/650 mg for  pain.                                               Ronald A. Darrelyn Hillock, M.D.    RAG/MEDQ  D:  10/05/2002  T:  10/05/2002  Job:  [id number]

## 2010-05-25 NOTE — H&P (Signed)
NAMEALEXINA, Gloria [name]              ACCOUNT NO.:  [id number]   MEDICAL RECORD NO.:  [id number]           PATIENT TYPE:   LOCATION:                                 FACILITY:   PHYSICIAN:  Almedia Balls. Ranell Patrick, M.D. DATE OF BIRTH:  [birth year]   DATE OF ADMISSION:  DATE OF DISCHARGE:                              HISTORY & PHYSICAL   CHIEF COMPLAINT:  Worsening right hip pain.   HPI:  Patient is a 77 year old female with worsening right hip pain that  has been refractory conservative treatment.  Patient has elected to have  a right total hip arthroplasty by Dr. Malon Kindle.   PAST MEDICAL HISTORY:  1. Hypertension.  2. Hyperlipidemia.  3. Arrhythmias.  4. GERD.  5. Coronary artery disease.   FAMILY MEDICAL HISTORY:  Coronary artery disease.   SOCIAL HISTORY:  Patient of Dr. Jarold Motto.  She is married, does not  smoke, use elicit drugs, or use alcohol.   DRUG ALLERGIES:  1. AMOXICILLIN.  2. KEFLEX.  3. CODEINE.   MEDICATIONS:  1. Ziac.  2. Erythromycin 250 mg 1 tab p.o. t.i.d.  3. Omeprazole 20 mg 1 tab p.o. daily.  4. Crestor 20 mg p.o. daily.  5. Bayer 81 mg aspirin p.o. daily.  6. Os-Cal 500 mg p.o. b.i.d.   REVIEW OF SYSTEMS:  Negative except for pain with ambulation and pain in  the right hip.   PHYSICAL EXAM:  Pulse 68.  Respirations 16.  Blood pressure 138/78.  Patient is a healthy-appearing 77 year old female in no acute distress.  Pleasant mood and affect.  Oriented x3.  NECK:  There is full range of motion without any tenderness.  Cranial  nerves II-XII grossly intact.  CHEST:  Shows active breath sounds bilaterally with no wheezes, rhonchi,  or rales.  HEART:  Shows a regular rate and rhythm.  No murmur.  ABDOMEN:  Nontender and nondistended with active bowel sounds.  EXTREMITIES:  Show moderate tenderness with right hip crepitus and  popping.  She does have an antalgic gait.  SKIN:  Shows no signs of rashes.  NEUROLOGICAL:  She is intact in bilateral upper  and lower extremities.   X-ray shows right hip end-stage osteoarthritis.   IMPRESSION:  Right hip end-stage osteoarthritis.   PLAN OF ACTION:  To have a right total hip arthroplasty by Dr. Malon Kindle.      Thomas B. Durwin Nora, P.A.      Almedia Balls. Ranell Patrick, M.D.  Electronically Signed    TBD/MEDQ  D:  12/23/2006  T:  12/23/2006  Job:  [id number]

## 2010-05-25 NOTE — Discharge Summary (Signed)
Gloria [name], Gloria [name]              ACCOUNT NO.:  [id number]   MEDICAL RECORD NO.:  [id number]          PATIENT TYPE:  INP   LOCATION:  5033                         FACILITY:  [facility]   PHYSICIAN:  Almedia Balls. Ranell Patrick, M.D. DATE OF BIRTH:  03/30/1930   DATE OF ADMISSION:  11/11/2005  DATE OF DISCHARGE:  11/15/2005                               DISCHARGE SUMMARY   ADMISSION DIAGNOSES:  1. Left femur fracture.  2. History of hypertension.  3. Gastroesophageal reflux disease.  4. Hyperlipidemia.   DISCHARGE DIAGNOSES:  1. Left femur fracture, status post open reduction and internal      fixation.  2. History of hypertension.  3. Gastroesophageal reflux disease.  4. Hyperlipidemia.   BRIEF HISTORY:  The patient is a 77 year old female who sustained a  ground level fall on November 11, 2005.  The patient was admitted from  the emergency department for operative management of her left hip.   HOSPITAL COURSE:  The patient was admitted on November 11, 2005 after  sustaining a left femur fracture.  The patient underwent a left hip  hemiarthroplasty by Dr. Almedia Balls. Norris using a [employer],  which she tolerated well.  After adequate time in the post-anesthesia  care unit she was transferred up to 5000.   On postoperative day #1 the patient complained about some mild pain to  that left hip; but, was able to participate with physical therapy quite  well. Her labs -- hemoglobin and hematocrit were stable.  This continued  throughout the week, with her labs remaining stable.  She did not  develop any severe postoperative anemia.  She did continue to work with  physical therapy with partial weightbearing with a rolling walker.   Unfortunately, she did not have any help at home and rehabilitation was  not an option due to no medical concerns.  Thus, the patient elected to  go to a skilled nursing facility once a bed was available.  She was  afebrile throughout her entire stay.  She did  develop one episode of  chest pain radiating to her right chest, early in the morning of  November 9.  [facility] was notified, but only Vicodin was given and  her symptoms resolved.   Currently she denies any continued chest pain or shortness of breath.  The patient can be discharged to a skilled nursing facility on November 15, 2005.   DISCHARGE ACTIVITIES:  25-50% weightbearing on that right lower  extremity with a rolling walker.  Daily dressing change.   FOLLOWUP:  The patient is to follow back up with Dr. Almedia Balls. Norris in  2 weeks.   CONDITION:  Stable.   DIET:  Regular.   ALLERGIES:  AMOXICILLIN, CEPHALOSPORINS.   DISCHARGE MEDICATIONS:  1. Lipitor 40 mg p.o. q.d.  2. Ziac one tablet p.o. daily.  3. Calcium carbonate 500 mg p.o. b.i.d.  4. Colace 100 mg p.o. b.i.d.  5. Erythromycin 250 mg p.o. t.i.d.  6. Robaxin 500 mg p.o. q.6 h.  7. Protonix 80 mg p.o. b.i.d.  8. Trinsicon one capsule p.o. t.i.d.  9. Coumadin.  10.Vicodin 1-2 tablets p.o. q.4-6 h. p.r.n. pain.  11.Temazepam 15 mg p.o. q.h.s. p.r.n.      Thomas B. Durwin Nora, P.A.      Almedia Balls. Ranell Patrick, M.D.  Electronically Signed    TBD/MEDQ  D:  11/15/2005  T:  11/15/2005  Job:  [id number]

## 2010-05-25 NOTE — Discharge Summary (Signed)
NAMEROSARIA, KUBIN              ACCOUNT NO.:  [id number]   MEDICAL RECORD NO.:  [id number]          PATIENT TYPE:  INP   LOCATION:  5033                         FACILITY:  [facility]   PHYSICIAN:  Almedia Balls. Ranell Patrick, M.D. DATE OF BIRTH:  1930/02/29   DATE OF ADMISSION:  11/11/2005  DATE OF DISCHARGE:  11/15/2005                               DISCHARGE SUMMARY   DIAGNOSIS:  Left hip displaced proximal femur fracture.   POST-OPERATIVE DIAGNOSIS:  The same.   PROCEDURE PERFORMED:  Left hip hemiarthroplasty, that was performed  November 11, 2005.   CONSULTING SERVICES:  Physical therapy, occupational therapy, discharge  planning.   HISTORY OF PRESENT ILLNESS:  The patient is a 77 year old female who  suffered a ground level fall, injuring her left hip.  The patient  presented to the [facility] emergency room with complaints of left hip  pain, inability to walk.  The patient had radiographs demonstrating a  displaced femoral neck fracture.  The patient was admitted for  definitive care.  For further details of the patient's past medical  history and physical examination, please see the medical record.   HOSPITAL COURSE:  The patient was admitted to the orthopedic surgery  service on November 11, 2005.  She was cleared for surgery and taken to  the operating room on November 5 for left hip hemiarthroplasty, which  was performed without complication.  The patient was immediately started  on 25% weightbearing following surgery, and physical therapy and  occupational therapy began working with her.  She had an uncomplicated  past and postoperative course with hemoglobin remaining stable and was  ready for discharge to a skilled nursing facility, on pain medication  and her previous medications as well as a muscle relaxer.  The patient  scheduled followup in orthopedic clinic in 2 weeks.  Her wound was  benign, and she was afebrile prior to her discharge, and she was  discharged on a regular  diet.      Almedia Balls. Ranell Patrick, M.D.  Electronically Signed     SRN/MEDQ  D:  12/15/2005  T:  12/15/2005  Job:  [id number]   cc:   Dr. Jarold Motto

## 2010-05-25 NOTE — Op Note (Signed)
Gloria [name], Gloria [name]              ACCOUNT NO.:  [id number]   MEDICAL RECORD NO.:  [id number]          PATIENT TYPE:  INP   LOCATION:  5033                         FACILITY:  [facility]   PHYSICIAN:  Almedia Balls. Ranell Patrick, M.D. DATE OF BIRTH:  1930-08-22   DATE OF PROCEDURE:  11/11/2005  DATE OF DISCHARGE:  11/15/2005                               OPERATIVE REPORT   ADDENDUM:   SURGEON:  Almedia Balls. Ranell Patrick, M.D.   ASSISTANT:  Note that the assistant for this procedure was Modesto Charon, PA-C, NOT Cleon Gustin.      Almedia Balls. Ranell Patrick, M.D.  Electronically Signed     SRN/MEDQ  D:  11/18/2005  T:  11/18/2005  Job:  [id number]   cc:   Malon Kindle, MD

## 2010-05-25 NOTE — Op Note (Signed)
NAMESAMMYE, STAFF              ACCOUNT NO.:  [id number]   MEDICAL RECORD NO.:  [id number]          PATIENT TYPE:  INP   LOCATION:  2550                         FACILITY:  [facility]   PHYSICIAN:  Almedia Balls. Ranell Patrick, M.D. DATE OF BIRTH:  1930-12-02   DATE OF PROCEDURE:  11/11/2005  DATE OF DISCHARGE:                               OPERATIVE REPORT   PREOPERATIVE DIAGNOSIS:  Left displaced femoral neck fracture.   POSTOPERATIVE DIAGNOSIS:  Left displaced femoral neck fracture.   PROCEDURE PERFORMED:  Left hip hemiarthroplasty using DePuy Summit  system.   ATTENDING SURGEON:  Almedia Balls. Ranell Patrick, M.D.   ASSISTANT:  [place] Charon, [place].   ANESTHESIA:  General anesthesia.   ESTIMATED BLOOD LOSS:  200 cc.   FLUID REPLACEMENT:  1100 cc crystalloid.   URINE OUTPUT:  175 cc.   COUNTS:  Instrument count was correct.   COMPLICATIONS:  There was no complications.   PERIOPERATIVE ANTIBIOTICS:  Given.   INDICATIONS:  The patient is a 77 year old female, who sustained a  ground-level fall, injuring the left hip.  The patient was unable to  stand after the injury and presents with a displaced femoral neck  fracture for further evaluation.  After counseling the patient regarding  options for management, she elected to proceed with surgical management  for the hip to restore proximal femoral anatomy to allow for  mobilization and early weightbearing.  Informed consent was obtained.   DESCRIPTION OF PROCEDURE:  After an adequate level of anesthesia was  achieved, the patient was positioned supine on the operating table.  She  was brought up into the lateral decubitus position with the left hip up.  The down leg was padded appropriately.  An axillary roll was utilized.  The left hip was sterilely prepped and draped in the usual manner.  We  utilized a Kocher-Langenbeck posterior approach, starting at the vastus  ridge and extending posteriorly along the gluteus medius.  Dissection  was  carried sharply down through subcutaneous tissues.  The tensor  fascia lata was split in line with the skin incision.  The short  external rotators were visualized.  The gluteus medius was retracted.  We dissected the short external rotators, including the piriformis, off  the posterior femur, progressively internally rotating until we could  see the fractured femoral neck.  There was displacement noted.  We went  ahead and performed a neck cut using a broach 1 fingerbreadth above the  lesser trochanter.  That was done using an oscillating saw.  We removed  the remaining neck, and then the femoral head.  We sized that to a 49  diameter, and then trialed the 49 in the hip socket.  We removed the  ligamentum teres and had a nice suction fit with that 49 head.  Next, we  performed the broach-only preparation of the femur up to a size 3 Summit  system by DePuy, and then trialed with a +1.5-mm neck ball with the  bipolar device.  We had an excellent fit with nice stability in all  ranges of motion, and excellent leg lengths, as compared  to the down  leg.  We then dislocated the hip and removed the trial components,  thoroughly irrigated the canal and then press-fit a Summit size 3 stem.  We had excellent version and had a nice tight fit.  We then placed the  real 1.5-mm head ball in place and the bipolar was reduced.  Again, a 49-  mm outer diameter.  Took the hip through a full range of motion and  excellent stability noted.  Thorough irrigation, followed by closure of  the capsule with interrupted #1 Ethibond suture figure-of-eight sutures  x3, followed by closure of the piriformis back to the gluteus medius  using Ethibond.  Tensor fascia lata closed.  No drain was utilized.  We  closed the tensor fascia lata over with #1 Vicryl suture figure-of-eight  and running, and then deep subcu with 0 Vicryl, superficial subcu with 2-  0 Vicryl, and 4-0 running Monocryl for skin and Steri-Strips.  A  sterile  dressing was applied.  The patient was placed in the knee immobilizer  and taken to the recovery room in stable condition.      Almedia Balls. Ranell Patrick, M.D.  Electronically Signed     SRN/MEDQ  D:  11/12/2005  T:  11/12/2005  Job:  [id number]

## 2010-05-25 NOTE — Discharge Summary (Signed)
Gloria [name], Gloria [name]              ACCOUNT NO.:  [id number]   MEDICAL RECORD NO.:  [id number]          PATIENT TYPE:  INP   LOCATION:  5033                         FACILITY:  [facility]   PHYSICIAN:  Almedia Balls. Ranell Patrick, M.D. DATE OF BIRTH:  October 29, 1930   DATE OF ADMISSION:  11/11/2005  DATE OF DISCHARGE:  11/15/2005                               DISCHARGE SUMMARY   ADMISSION DIAGNOSIS:  Left femur fracture.   DISCHARGE DIAGNOSIS:  Left femur fracture status post open reduction  internal fixation.   BRIEF HISTORY:  The patient is a 77 year old female who had a ground-  level fall to her left hip.  The patient complained about moderate pain  to the left hip and unable to bear weight, thus, presented to the  emergency department for evaluation and surgical management.   PROCEDURE:  Patient had a left hemiarthroplasty using the Dupuytren  system on November 11, 2005.  Attending surgeon was Malon Kindle, M.D.,  assistant was Dan Humphreys, PA-C, general anesthesia was used, 200 mL of  estimated blood loss, fluid replacement was 1100 mL and urine output was  175 mL and no complications.  Perioperative antibiotics were given.   HOSPITAL COURSE:  Patient was admitted on November 11, 2005 after  sustaining ground-level fall.  After undergoing the above-stated  procedure and spending adequate time in the postanesthesia care unit,  she was transferred up to the floor.  On postoperative day one, she  complained of only minimal pain to that left hip.  Her hemoglobin and  hematocrit were stable, INR was 1.2 and dressing was clean and dry.  Patient did request on postoperative day two to be discharged to a  skilled nursing facility to help her with ambulation and help her with  her ADLs before being discharged formally to home, thus, we did continue  with physical therapy and occupational therapy until skilled nursing  facility could be arranged.  Her hematocrit and hemoglobin stayed stable  throughout her  entire visit, her incision was healing well with no signs  of erythema or drainage and neurovascularly she was intact distally.  Patient did complain about some mild tightness in her left chest on  postoperative day three.  Her EKG was normal with normal sinus rhythm  with a rate of 70.  Patient stated the pain did alleviate on its own  after several minutes.   DISCHARGE PLAN:  Patient will be discharged to a skilled-nursing  facility on November 15, 2005.  Her condition is stable.  Her diet is  regular.   DISCHARGE MEDICATIONS:  1. Ziac 5/125 one tab q. day.  2. Erythromycin 250 mg p.o. t.i.d. for seven days.  3. Nexium 40 mg p.o. b.i.d.  4. Crestor 20 mg p.o. q. day.  5. Aspirin 81 mg p.o. q. day.  6. Os-Cal one tab p.o. b.i.d.   PATIENT DOES HAVE ALLERGIES TO AMOXICILLIN, KEFLEX, CODEINE AND REGLAN.   PAST MEDICAL HISTORY:  1. Hypertension.  2. Hyperlipidemia.  3. GERD.   FOLLOWUP:  Patient to follow back up with Dr. Malon Kindle in two weeks  and weightbearing as  tolerated for the left hip.      Thomas B. Durwin Nora, P.A.      Almedia Balls. Ranell Patrick, M.D.  Electronically Signed    TBD/MEDQ  D:  01/14/2006  T:  01/15/2006  Job:  [id number]

## 2010-06-08 ENCOUNTER — Encounter: Payer: Self-pay | Admitting: Cardiology

## 2010-06-08 ENCOUNTER — Ambulatory Visit (INDEPENDENT_AMBULATORY_CARE_PROVIDER_SITE_OTHER): Payer: Medicare Other | Admitting: Cardiology

## 2010-06-08 VITALS — BP 152/86 | HR 58 | Ht 66.25 in | Wt 147.0 lb

## 2010-06-08 DIAGNOSIS — K589 Irritable bowel syndrome without diarrhea: Secondary | ICD-10-CM

## 2010-06-08 DIAGNOSIS — I4891 Unspecified atrial fibrillation: Secondary | ICD-10-CM

## 2010-06-08 DIAGNOSIS — I480 Paroxysmal atrial fibrillation: Secondary | ICD-10-CM | POA: Insufficient documentation

## 2010-06-08 DIAGNOSIS — E785 Hyperlipidemia, unspecified: Secondary | ICD-10-CM | POA: Insufficient documentation

## 2010-06-08 DIAGNOSIS — I10 Essential (primary) hypertension: Secondary | ICD-10-CM

## 2010-06-08 MED ORDER — WARFARIN SODIUM 4 MG PO TABS: 4.0000 mg | ORAL_TABLET | Freq: Every day | ORAL | Status: DC

## 2010-06-08 NOTE — Assessment & Plan Note (Signed)
Based on her blood pressure readings from home she has excellent blood pressure control. We will continue on her current therapy.

## 2010-06-08 NOTE — Patient Instructions (Signed)
Stop Xarelto.  We will start you on warfarin 4 mg daily.  Stay on your other medications.  We will check your blood test in one week for your warfarin.

## 2010-06-08 NOTE — Progress Notes (Signed)
   Gloria [name] Date of Birth: 05-03-1930   History of Present Illness: Gloria [name] seen today for followup. She states she is doing very well. Since she is back on her erythromycin her bowel symptoms have stabilized. Her blood pressure readings at home have been excellent. She states she will not be able to afford Xarelto and wants to switch back to Coumadin. She denies any dyspnea, chest pain, or edema.  Current Outpatient Prescriptions on File Prior to Visit  Medication Sig Dispense Refill  . calcium carbonate (OS-CAL) 600 MG TABS Take 600 mg by mouth 2 (two) times daily with a meal.        . flecainide (TAMBOCOR) 50 MG tablet Take 50 mg by mouth 2 (two) times daily.        . hydrochlorothiazide 25 MG tablet Take 25 mg by mouth daily.        Marland Kitchen omeprazole (PRILOSEC) 40 MG capsule Take 40 mg by mouth 2 (two) times daily.        Marland Kitchen erythromycin (E-MYCIN) 250 MG tablet Take 1 tablet by mouth Twice daily.        Allergies  Allergen Reactions  . Amoxicillin   . Cephalexin   . Codeine   . Reglan     Past Medical History  Diagnosis Date  . Hypertension   . Hyperlipidemia   . GERD (gastroesophageal reflux disease)     hx of  . Arthritis     right  . Arrhythmia     atrial fibrillation,paroxysmal  . Irritable bowel syndrome   . Meningioma   . Hepatitis C     Past Surgical History  Procedure Date  . Total hip arthroplasty 11/07    left  . Parotid gland tumor excision   . Total knee arthroplasty     History  Smoking status  . Former Smoker -- 0.3 packs/day for 15 years  . Types: Cigarettes  . Quit date: 06/07/1980  Smokeless tobacco  . Never Used    History  Alcohol Use No    Family History  Problem Relation Age of Onset  . Heart disease Brother     Review of Systems: The review of systems is positive for intermittent bowel upset.  All other systems were reviewed and are negative.  Physical Exam: BP 152/86  Pulse 58  Ht 5' 6.25" (1.683 m)  Wt 147 lb  (66.679 kg)  BMI 23.55 kg/m2 She is a pleasant white female in no acute distress. Her HEENT exam is unremarkable. She has no JVD or bruits. Lungs are clear. Cardiac exam reveals a regular rate and rhythm with a soft 1/6 systolic ejection murmur at right upper sternal border. Abdomen is soft and nontender without masses. She has no edema. Pedal pulses are good. LABORATORY DATA:   Assessment / Plan:

## 2010-06-08 NOTE — Assessment & Plan Note (Signed)
She is maintaining sinus rhythm well on flecainide. Since she is unable to afford is Xarelto  we'll switch her to Coumadin beginning at 4 mg daily. We will check her first INR in 5 days. Otherwise I will followup again in 6 months.

## 2010-06-13 ENCOUNTER — Ambulatory Visit (INDEPENDENT_AMBULATORY_CARE_PROVIDER_SITE_OTHER): Payer: Medicare Other | Admitting: *Deleted

## 2010-06-13 ENCOUNTER — Encounter: Payer: Medicare Other | Admitting: *Deleted

## 2010-06-13 DIAGNOSIS — I4891 Unspecified atrial fibrillation: Secondary | ICD-10-CM

## 2010-06-13 LAB — POCT INR: INR: 1.6

## 2010-06-21 ENCOUNTER — Ambulatory Visit (INDEPENDENT_AMBULATORY_CARE_PROVIDER_SITE_OTHER): Payer: Medicare Other | Admitting: *Deleted

## 2010-06-21 DIAGNOSIS — Z7901 Long term (current) use of anticoagulants: Secondary | ICD-10-CM

## 2010-06-21 DIAGNOSIS — I4891 Unspecified atrial fibrillation: Secondary | ICD-10-CM

## 2010-06-22 LAB — POCT INR: INR: 3.1

## 2010-06-28 ENCOUNTER — Ambulatory Visit (INDEPENDENT_AMBULATORY_CARE_PROVIDER_SITE_OTHER): Payer: Medicare Other | Admitting: *Deleted

## 2010-06-28 DIAGNOSIS — I4891 Unspecified atrial fibrillation: Secondary | ICD-10-CM

## 2010-06-28 DIAGNOSIS — Z7901 Long term (current) use of anticoagulants: Secondary | ICD-10-CM

## 2010-06-28 LAB — POCT INR: INR: 1.9

## 2010-07-12 ENCOUNTER — Ambulatory Visit (INDEPENDENT_AMBULATORY_CARE_PROVIDER_SITE_OTHER): Payer: Medicare Other | Admitting: *Deleted

## 2010-07-12 DIAGNOSIS — Z7901 Long term (current) use of anticoagulants: Secondary | ICD-10-CM

## 2010-07-12 DIAGNOSIS — I4891 Unspecified atrial fibrillation: Secondary | ICD-10-CM

## 2010-07-12 LAB — POCT INR: INR: 2.1

## 2010-08-08 ENCOUNTER — Ambulatory Visit (INDEPENDENT_AMBULATORY_CARE_PROVIDER_SITE_OTHER): Payer: Medicare Other | Admitting: *Deleted

## 2010-08-08 DIAGNOSIS — Z7901 Long term (current) use of anticoagulants: Secondary | ICD-10-CM

## 2010-08-08 DIAGNOSIS — I4891 Unspecified atrial fibrillation: Secondary | ICD-10-CM

## 2010-08-08 LAB — POCT INR: INR: 2.1

## 2010-09-05 ENCOUNTER — Ambulatory Visit (INDEPENDENT_AMBULATORY_CARE_PROVIDER_SITE_OTHER): Payer: Medicare Other | Admitting: *Deleted

## 2010-09-05 DIAGNOSIS — I4891 Unspecified atrial fibrillation: Secondary | ICD-10-CM

## 2010-09-05 DIAGNOSIS — Z7901 Long term (current) use of anticoagulants: Secondary | ICD-10-CM

## 2010-09-05 LAB — POCT INR: INR: 1.5

## 2010-09-19 ENCOUNTER — Ambulatory Visit (INDEPENDENT_AMBULATORY_CARE_PROVIDER_SITE_OTHER): Payer: Medicare Other | Admitting: *Deleted

## 2010-09-19 DIAGNOSIS — Z7901 Long term (current) use of anticoagulants: Secondary | ICD-10-CM

## 2010-09-19 DIAGNOSIS — I4891 Unspecified atrial fibrillation: Secondary | ICD-10-CM

## 2010-09-19 LAB — POCT INR: INR: 1.7

## 2010-09-26 ENCOUNTER — Encounter: Payer: Medicare Other | Admitting: *Deleted

## 2010-09-27 LAB — CBC
HCT: 30.3 — ABNORMAL LOW
HCT: 30.8 — ABNORMAL LOW
HCT: 31.9 — ABNORMAL LOW
Hemoglobin: 10.5 — ABNORMAL LOW
Hemoglobin: 10.6 — ABNORMAL LOW
Hemoglobin: 11.1 — ABNORMAL LOW
MCHC: 34.3
MCHC: 34.6
MCHC: 34.8
MCV: 90.2
MCV: 90.8
MCV: 90.9
Platelets: 170
Platelets: 171
Platelets: 175
RBC: 3.33 — ABNORMAL LOW
RBC: 3.39 — ABNORMAL LOW
RBC: 3.53 — ABNORMAL LOW
RDW: 13.3
RDW: 13.4
RDW: 13.6
WBC: 10
WBC: 10.5
WBC: 7.2

## 2010-09-27 LAB — BASIC METABOLIC PANEL
BUN: 14
BUN: 5 — ABNORMAL LOW
BUN: 6
CO2: 25
CO2: 28
CO2: 29
Calcium: 8.3 — ABNORMAL LOW
Calcium: 8.6
Calcium: 8.7
Chloride: 103
Chloride: 105
Chloride: 109
Creatinine, Ser: 0.58
Creatinine, Ser: 0.59
Creatinine, Ser: 0.66
GFR calc Af Amer: >60
GFR calc Af Amer: >60
GFR calc Af Amer: >60
GFR calc non Af Amer: >60
GFR calc non Af Amer: >60
GFR calc non Af Amer: >60
Glucose, Bld: 114 — ABNORMAL HIGH
Glucose, Bld: 120 — ABNORMAL HIGH
Glucose, Bld: 87
Potassium: 3.5
Potassium: 3.6
Potassium: 3.9
Sodium: 136
Sodium: 138
Sodium: 142

## 2010-09-27 LAB — PROTIME-INR
INR: 1.2
INR: 1.7 — ABNORMAL HIGH
INR: 1.9 — ABNORMAL HIGH
INR: 2 — ABNORMAL HIGH
INR: 2 — ABNORMAL HIGH
Prothrombin Time: 15.1
Prothrombin Time: 20.3 — ABNORMAL HIGH
Prothrombin Time: 22.7 — ABNORMAL HIGH
Prothrombin Time: 22.9 — ABNORMAL HIGH
Prothrombin Time: 23.2 — ABNORMAL HIGH

## 2010-10-03 ENCOUNTER — Ambulatory Visit (INDEPENDENT_AMBULATORY_CARE_PROVIDER_SITE_OTHER): Payer: Medicare Other | Admitting: *Deleted

## 2010-10-03 DIAGNOSIS — Z7901 Long term (current) use of anticoagulants: Secondary | ICD-10-CM

## 2010-10-03 DIAGNOSIS — I4891 Unspecified atrial fibrillation: Secondary | ICD-10-CM

## 2010-10-03 LAB — POCT INR: INR: 1.8

## 2010-10-08 LAB — CBC
HCT: 41.8
Hemoglobin: 13.8
MCHC: 33
MCV: 95.6
Platelets: 215
RBC: 4.37
RDW: 13
WBC: 6.1

## 2010-10-08 LAB — BASIC METABOLIC PANEL
BUN: 13
CO2: 30
Calcium: 9.7
Chloride: 106
Creatinine, Ser: 0.63
GFR calc Af Amer: >60
GFR calc non Af Amer: >60
Glucose, Bld: 81
Potassium: 4.8
Sodium: 140

## 2010-10-12 LAB — URINALYSIS, ROUTINE W REFLEX MICROSCOPIC
Bilirubin Urine: NEGATIVE
Glucose, UA: NEGATIVE
Hgb urine dipstick: NEGATIVE
Ketones, ur: NEGATIVE
Nitrite: NEGATIVE
Protein, ur: NEGATIVE
Specific Gravity, Urine: 1.021
Urobilinogen, UA: 0.2
pH: 6

## 2010-10-12 LAB — BASIC METABOLIC PANEL
BUN: 14
CO2: 27
Calcium: 9.6
Chloride: 106
Creatinine, Ser: 0.64
GFR calc Af Amer: >60
GFR calc non Af Amer: >60
Glucose, Bld: 91
Potassium: 4
Sodium: 139

## 2010-10-12 LAB — CBC
HCT: 42.6
Hemoglobin: 14.5
MCHC: 34
MCV: 91.2
Platelets: 245
RBC: 4.67
RDW: 13.6
WBC: 7.8

## 2010-10-12 LAB — DIFFERENTIAL
Basophils Absolute: 0
Basophils Relative: 0
Eosinophils Absolute: 0.2
Eosinophils Relative: 3
Lymphocytes Relative: 24
Lymphs Abs: 1.9
Monocytes Absolute: 0.5
Monocytes Relative: 7
Neutro Abs: 5.1
Neutrophils Relative %: 66

## 2010-10-12 LAB — TYPE AND SCREEN
ABO/RH(D): O POS
Antibody Screen: NEGATIVE

## 2010-10-12 LAB — APTT: aPTT: 26

## 2010-10-12 LAB — ABO/RH: ABO/RH(D): O POS

## 2010-10-12 LAB — PROTIME-INR
INR: 1
Prothrombin Time: 13.2

## 2010-10-12 LAB — URINE MICROSCOPIC-ADD ON

## 2010-10-16 LAB — I-STAT 8, (EC8 V) (CONVERTED LAB)
Acid-base deficit: 1
BUN: 15
Bicarbonate: 23.8
Chloride: 107
Glucose, Bld: 100 — ABNORMAL HIGH
HCT: 43
Hemoglobin: 14.6
Operator id: 198171
Potassium: 3.9
Sodium: 138
TCO2: 25
pCO2, Ven: 40.1 — ABNORMAL LOW
pH, Ven: 7.38 — ABNORMAL HIGH

## 2010-10-16 LAB — CBC
HCT: 40.5
Hemoglobin: 13.6
MCHC: 33.7
MCV: 91.2
Platelets: 218
RBC: 4.44
RDW: 13.4
WBC: 6.9

## 2010-10-16 LAB — POCT CARDIAC MARKERS
CKMB, poc: 2.1
Myoglobin, poc: 67.1
Operator id: 198171
Troponin i, poc: <0.05

## 2010-10-16 LAB — POCT I-STAT CREATININE
Creatinine, Ser: 0.7
Operator id: 198171

## 2010-10-24 ENCOUNTER — Ambulatory Visit (INDEPENDENT_AMBULATORY_CARE_PROVIDER_SITE_OTHER): Payer: Medicare Other | Admitting: *Deleted

## 2010-10-24 DIAGNOSIS — Z7901 Long term (current) use of anticoagulants: Secondary | ICD-10-CM

## 2010-10-24 DIAGNOSIS — I4891 Unspecified atrial fibrillation: Secondary | ICD-10-CM

## 2010-10-24 LAB — POCT INR: INR: 1.9

## 2010-10-28 ENCOUNTER — Other Ambulatory Visit: Payer: Self-pay | Admitting: Cardiology

## 2010-10-29 ENCOUNTER — Other Ambulatory Visit: Payer: Self-pay

## 2010-11-06 ENCOUNTER — Other Ambulatory Visit: Payer: Self-pay | Admitting: Cardiology

## 2010-11-06 MED ORDER — FLECAINIDE ACETATE 50 MG PO TABS: 50.0000 mg | ORAL_TABLET | Freq: Two times a day (BID) | ORAL | Status: DC

## 2010-11-14 ENCOUNTER — Ambulatory Visit (INDEPENDENT_AMBULATORY_CARE_PROVIDER_SITE_OTHER): Payer: Medicare Other | Admitting: *Deleted

## 2010-11-14 DIAGNOSIS — I4891 Unspecified atrial fibrillation: Secondary | ICD-10-CM

## 2010-11-14 DIAGNOSIS — Z7901 Long term (current) use of anticoagulants: Secondary | ICD-10-CM

## 2010-11-14 LAB — POCT INR: INR: 2.1

## 2010-12-05 ENCOUNTER — Ambulatory Visit (INDEPENDENT_AMBULATORY_CARE_PROVIDER_SITE_OTHER): Payer: Medicare Other | Admitting: *Deleted

## 2010-12-05 DIAGNOSIS — I4891 Unspecified atrial fibrillation: Secondary | ICD-10-CM

## 2010-12-05 DIAGNOSIS — Z7901 Long term (current) use of anticoagulants: Secondary | ICD-10-CM

## 2010-12-05 LAB — POCT INR: INR: 2.4

## 2010-12-06 ENCOUNTER — Encounter: Payer: Self-pay | Admitting: Cardiology

## 2010-12-10 ENCOUNTER — Ambulatory Visit (INDEPENDENT_AMBULATORY_CARE_PROVIDER_SITE_OTHER): Payer: Medicare Other | Admitting: Cardiology

## 2010-12-10 ENCOUNTER — Encounter: Payer: Self-pay | Admitting: Cardiology

## 2010-12-10 VITALS — BP 122/70 | HR 64 | Ht 66.0 in | Wt 152.0 lb

## 2010-12-10 DIAGNOSIS — I10 Essential (primary) hypertension: Secondary | ICD-10-CM

## 2010-12-10 DIAGNOSIS — I4891 Unspecified atrial fibrillation: Secondary | ICD-10-CM

## 2010-12-10 MED ORDER — WARFARIN SODIUM 4 MG PO TABS: 4.0000 mg | ORAL_TABLET | Freq: Every day | ORAL | Status: DC

## 2010-12-10 MED ORDER — HYDROCHLOROTHIAZIDE 25 MG PO TABS: 25.0000 mg | ORAL_TABLET | Freq: Every day | ORAL | Status: DC

## 2010-12-10 NOTE — Progress Notes (Signed)
   Gloria [name] Date of Birth: Dec 23, 1930   History of Present Illness: Gloria [name] seen today for followup. She states she is doing very well. Since she is back on her erythromycin her bowel symptoms have stabilized. Her blood pressure readings at home have been excellent.  She denies any dyspnea, chest pain, or edema. She is doing well on Coumadin.  Current Outpatient Prescriptions on File Prior to Visit  Medication Sig Dispense Refill  . calcium carbonate (OS-CAL) 600 MG TABS Take 600 mg by mouth 2 (two) times daily with a meal.        . erythromycin (E-MYCIN) 250 MG tablet Take 1 tablet by mouth Twice daily.      . flecainide (TAMBOCOR) 50 MG tablet Take 1 tablet (50 mg total) by mouth 2 (two) times daily.  180 tablet  1  . omeprazole (PRILOSEC) 40 MG capsule Take 40 mg by mouth 2 (two) times daily.        Marland Kitchen DISCONTD: hydrochlorothiazide 25 MG tablet Take 25 mg by mouth daily.          Allergies  Allergen Reactions  . Amoxicillin   . Cephalexin   . Codeine   . Reglan     Past Medical History  Diagnosis Date  . Hypertension   . Hyperlipidemia   . GERD (gastroesophageal reflux disease)     hx of  . Arthritis     right  . Atrial fibrillation   . Irritable bowel syndrome   . Meningioma   . Hepatitis C     Past Surgical History  Procedure Date  . Total hip arthroplasty 11/07    left  . Parotid gland tumor excision   . Total knee arthroplasty   . Transthoracic echocardiogram 10/25/2009    EF 55-60%  . Cardiovascular stress test 11/14/2006    EF 67%    History  Smoking status  . Former Smoker -- 0.3 packs/day for 15 years  . Types: Cigarettes  . Quit date: 06/07/1980  Smokeless tobacco  . Never Used    History  Alcohol Use No    Family History  Problem Relation Age of Onset  . Heart disease Brother     Review of Systems: The review of systems is positive for intermittent bowel upset.  All other systems were reviewed and are negative.  Physical  Exam: BP 122/70  Pulse 64  Ht 5\' 6"  (1.676 m)  Wt 152 lb (68.947 kg)  BMI 24.53 kg/m2 She is a pleasant white female in no acute distress. Her HEENT exam is unremarkable. She has no JVD or bruits. Lungs are clear. Cardiac exam reveals a regular rate and rhythm with a soft 1/6 systolic ejection murmur at right upper sternal border. Abdomen is soft and nontender without masses. She has no edema. Pedal pulses are good. LABORATORY DATA: Last INR was 2.4 on November 28.  Assessment / Plan:

## 2010-12-10 NOTE — Assessment & Plan Note (Signed)
Blood pressure is under excellent control. We refill her HCTZ today.

## 2010-12-10 NOTE — Patient Instructions (Signed)
Continue your current medications  I will see you again in 6 months.   

## 2010-12-10 NOTE — Assessment & Plan Note (Signed)
Excellent control on flecainide. She is on long-term anticoagulation with Coumadin.

## 2011-01-02 ENCOUNTER — Ambulatory Visit (INDEPENDENT_AMBULATORY_CARE_PROVIDER_SITE_OTHER): Payer: Medicare Other | Admitting: *Deleted

## 2011-01-02 DIAGNOSIS — I4891 Unspecified atrial fibrillation: Secondary | ICD-10-CM

## 2011-01-02 DIAGNOSIS — Z7901 Long term (current) use of anticoagulants: Secondary | ICD-10-CM

## 2011-01-02 LAB — POCT INR: INR: 1.5

## 2011-01-17 ENCOUNTER — Emergency Department (HOSPITAL_COMMUNITY): Payer: Medicare Other

## 2011-01-17 ENCOUNTER — Emergency Department (HOSPITAL_COMMUNITY)
Admission: EM | Admit: 2011-01-17 | Discharge: 2011-01-17 | Disposition: A | Discharge status: Home / Self Care | Payer: Medicare Other | Attending: Emergency Medicine | Admitting: Emergency Medicine

## 2011-01-17 ENCOUNTER — Encounter (HOSPITAL_COMMUNITY): Payer: Self-pay | Admitting: Emergency Medicine

## 2011-01-17 ENCOUNTER — Other Ambulatory Visit: Payer: Self-pay

## 2011-01-17 DIAGNOSIS — I10 Essential (primary) hypertension: Secondary | ICD-10-CM | POA: Insufficient documentation

## 2011-01-17 DIAGNOSIS — R030 Elevated blood-pressure reading, without diagnosis of hypertension: Secondary | ICD-10-CM | POA: Diagnosis not present

## 2011-01-17 DIAGNOSIS — M25559 Pain in unspecified hip: Secondary | ICD-10-CM | POA: Insufficient documentation

## 2011-01-17 DIAGNOSIS — I498 Other specified cardiac arrhythmias: Secondary | ICD-10-CM | POA: Insufficient documentation

## 2011-01-17 DIAGNOSIS — Z7901 Long term (current) use of anticoagulants: Secondary | ICD-10-CM | POA: Insufficient documentation

## 2011-01-17 DIAGNOSIS — S322XXA Fracture of coccyx, initial encounter for closed fracture: Secondary | ICD-10-CM | POA: Diagnosis not present

## 2011-01-17 DIAGNOSIS — W108XXA Fall (on) (from) other stairs and steps, initial encounter: Secondary | ICD-10-CM | POA: Insufficient documentation

## 2011-01-17 DIAGNOSIS — S3210XA Unspecified fracture of sacrum, initial encounter for closed fracture: Secondary | ICD-10-CM | POA: Insufficient documentation

## 2011-01-17 DIAGNOSIS — Z96649 Presence of unspecified artificial hip joint: Secondary | ICD-10-CM | POA: Diagnosis not present

## 2011-01-17 DIAGNOSIS — R6889 Other general symptoms and signs: Secondary | ICD-10-CM | POA: Diagnosis not present

## 2011-01-17 DIAGNOSIS — W19XXXA Unspecified fall, initial encounter: Secondary | ICD-10-CM

## 2011-01-17 DIAGNOSIS — Z8619 Personal history of other infectious and parasitic diseases: Secondary | ICD-10-CM | POA: Diagnosis not present

## 2011-01-17 LAB — CBC
HCT: 39.3 % (ref 36.0–46.0)
Hemoglobin: 13.1 g/dL (ref 12.0–15.0)
MCH: 29.3 pg (ref 26.0–34.0)
MCHC: 33.3 g/dL (ref 30.0–36.0)
MCV: 87.9 fL (ref 78.0–100.0)
Platelets: 227 10*3/uL (ref 150–400)
RBC: 4.47 MIL/uL (ref 3.87–5.11)
RDW: 13.9 % (ref 11.5–15.5)
WBC: 7.7 10*3/uL (ref 4.0–10.5)

## 2011-01-17 LAB — BASIC METABOLIC PANEL
BUN: 18 mg/dL (ref 6–23)
CO2: 24 mEq/L (ref 19–32)
Calcium: 9.5 mg/dL (ref 8.4–10.5)
Chloride: 103 mEq/L (ref 96–112)
Creatinine, Ser: 0.7 mg/dL (ref 0.50–1.10)
GFR calc Af Amer: >90 mL/min (ref >90)
GFR calc non Af Amer: 81 mL/min — ABNORMAL LOW (ref >90)
Glucose, Bld: 95 mg/dL (ref 70–99)
Potassium: 3.5 mEq/L (ref 3.5–5.1)
Sodium: 139 mEq/L (ref 135–145)

## 2011-01-17 LAB — DIFFERENTIAL
Basophils Absolute: 0 10*3/uL (ref 0.0–0.1)
Basophils Relative: 1 % (ref 0–1)
Eosinophils Absolute: 0.4 10*3/uL (ref 0.0–0.7)
Eosinophils Relative: 5 % (ref 0–5)
Lymphocytes Relative: 22 % (ref 12–46)
Lymphs Abs: 1.7 10*3/uL (ref 0.7–4.0)
Monocytes Absolute: 0.7 10*3/uL (ref 0.1–1.0)
Monocytes Relative: 9 % (ref 3–12)
Neutro Abs: 4.9 10*3/uL (ref 1.7–7.7)
Neutrophils Relative %: 64 % (ref 43–77)

## 2011-01-17 LAB — PROTIME-INR
INR: 1.66 — ABNORMAL HIGH (ref 0.00–1.49)
Prothrombin Time: 19.9 seconds — ABNORMAL HIGH (ref 11.6–15.2)

## 2011-01-17 MED ORDER — MORPHINE SULFATE 4 MG/ML IJ SOLN
4.0000 mg | Freq: Once | INTRAMUSCULAR | Status: AC
  Administered 2011-01-17: 4 mg via INTRAVENOUS
  Filled 2011-01-17: qty 1

## 2011-01-17 MED ORDER — OXYCODONE-ACETAMINOPHEN 5-325 MG PO TABS: 1.0000 | ORAL_TABLET | Freq: Four times a day (QID) | ORAL | Status: AC | PRN

## 2011-01-17 MED ORDER — OXYCODONE-ACETAMINOPHEN 5-325 MG PO TABS
1.0000 | ORAL_TABLET | Freq: Once | ORAL | Status: AC
  Administered 2011-01-17: 1 via ORAL
  Filled 2011-01-17: qty 1

## 2011-01-17 MED ORDER — POLYETHYLENE GLYCOL 3350 17 GM/SCOOP PO POWD: 17.0000 g | Freq: Every day | ORAL | Status: AC

## 2011-01-17 MED ORDER — ONDANSETRON HCL 4 MG/2ML IJ SOLN
4.0000 mg | Freq: Once | INTRAMUSCULAR | Status: AC
  Administered 2011-01-17: 4 mg via INTRAVENOUS
  Filled 2011-01-17: qty 2

## 2011-01-17 NOTE — ED Notes (Signed)
Pt ambulated to br with husband at her side.  Pt is able to walk unassisted.

## 2011-01-17 NOTE — ED Notes (Signed)
Pt had fallen at 1000 yesterday.  Pt complains of left leg pain

## 2011-01-17 NOTE — ED Notes (Signed)
QMV:[id number]<NG> Expected date:01/17/11<BR> Expected time: 1:23 AM<BR> Means of arrival:Ambulance<BR> Comments:<BR> Fall 24 hours ago

## 2011-01-17 NOTE — ED Provider Notes (Signed)
History     CSN: [id number]  Arrival date & time 01/17/11  0145   First MD Initiated Contact with Patient 01/17/11 0200      Chief Complaint  Patient presents with  . Hip Pain    fall at 1000 left leg pain, hx of hip replacement on left hip.     HPI   History provided by the patient. Patient is a 78 year old female with history of hypertension, A. fib was on Coumadin, and left hip replacement who presents with complaints of left hip pain after falling yesterday morning. Patient reports going back into her back door and going up the steps when she fell and landed on her left side. Patient is not sure if she tripped or stumbled causing the fall. Patient denies having LOC or head injury. Patient states she may have been slightly dizzy and that she often has spells of dizziness in the past. She denied having any symptoms of chest pain or shortness of breath. Patient tried to rest at home the rest of the day and took 2 Vicodin from an old prescription for her pains. Pain is worse with movements and with standing and putting weight on leg. Pain became too great and patient came to the emergency room for further evaluation. Patient denies any numbness or weakness in the foot. She has no other symptoms.   Past Medical History  Diagnosis Date  . Hypertension   . Hyperlipidemia   . GERD (gastroesophageal reflux disease)     hx of  . Arthritis     right  . Atrial fibrillation   . Irritable bowel syndrome   . Meningioma   . Hepatitis C     Past Surgical History  Procedure Date  . Total hip arthroplasty 11/07    left  . Parotid gland tumor excision   . Total knee arthroplasty   . Transthoracic echocardiogram 10/25/2009    EF 55-60%  . Cardiovascular stress test 11/14/2006    EF 67%    Family History  Problem Relation Age of Onset  . Heart disease Brother     History  Substance Use Topics  . Smoking status: Former Smoker -- 0.3 packs/day for 15 years    Types: Cigarettes   Quit date: 06/07/1980  . Smokeless tobacco: Never Used  . Alcohol Use: No    OB History    Grav Para Term Preterm Abortions TAB SAB Ect Mult Living                  Review of Systems  Constitutional: Negative for fever and chills.  Respiratory: Negative for cough and shortness of breath.   Cardiovascular: Negative for chest pain.  Gastrointestinal: Negative for nausea, vomiting, abdominal pain, diarrhea and constipation.  Neurological: Negative for weakness, numbness and headaches.  All other systems reviewed and are negative.    Allergies  Amoxicillin; Cephalexin; Codeine; and Reglan  Home Medications   Current Outpatient Rx  Name Route Sig Dispense Refill  . CALCIUM CARBONATE 600 MG PO TABS Oral Take 600 mg by mouth 2 (two) times daily with a meal.      . ERYTHROMYCIN BASE 250 MG PO TABS Oral Take 1 tablet by mouth Twice daily.    Marland Kitchen FLECAINIDE ACETATE 50 MG PO TABS Oral Take 1 tablet (50 mg total) by mouth 2 (two) times daily. 180 tablet 1  . HYDROCHLOROTHIAZIDE 25 MG PO TABS Oral Take 1 tablet (25 mg total) by mouth daily. 90 tablet 3  .  OMEPRAZOLE 40 MG PO CPDR Oral Take 40 mg by mouth 2 (two) times daily.      . WARFARIN SODIUM 4 MG PO TABS Oral Take 1 tablet (4 mg total) by mouth daily. 100 tablet 3    BP 150/90  Pulse 56  Temp(Src) 98.2 F (36.8 C) (Oral)  SpO2 97%  Physical Exam  Nursing note and vitals reviewed. Constitutional: She is oriented to person, place, and time. She appears well-developed and well-nourished. No distress.  HENT:  Head: Normocephalic.  Neck: Normal range of motion. Neck supple.  Cardiovascular: Normal rate.  An irregular rhythm present.  Pulmonary/Chest: Effort normal and breath sounds normal. No respiratory distress. She has no wheezes. She has no rales.  Abdominal: Soft. She exhibits no distension. There is no tenderness. There is no rebound.  Musculoskeletal: She exhibits tenderness. She exhibits no edema.       Cervical back:  Normal.       Thoracic back: Normal.       Lumbar back: Normal.       Pain with palpation over left hip. Old surgical scar consistent with prior surgery. No deformity of leg, shortening or rotation. Patient has full range of motion but with pain. There is no crepitus or popping in the hip joint. She has normal dorsal pedal pulses and sensations. Skin appears normal without bruising, rash or petechiae  Neurological: She is alert and oriented to person, place, and time.  Skin: Skin is warm and dry. No rash noted.  Psychiatric: She has a normal mood and affect. Her behavior is normal.    ED Course  Procedures (including critical care time)   Labs Reviewed  PROTIME-INR  CBC  DIFFERENTIAL  BASIC METABOLIC PANEL    Results for orders placed during the hospital encounter of 01/17/11  PROTIME-INR      Component Value Range   Prothrombin Time 19.9 (*) 11.6 - 15.2 (seconds)   INR 1.66 (*) 0.00 - 1.49   CBC      Component Value Range   WBC 7.7  4.0 - 10.5 (K/uL)   RBC 4.47  3.87 - 5.11 (MIL/uL)   Hemoglobin 13.1  12.0 - 15.0 (g/dL)   HCT 16.1  09.6 - 04.5 (%)   MCV 87.9  78.0 - 100.0 (fL)   MCH 29.3  26.0 - 34.0 (pg)   MCHC 33.3  30.0 - 36.0 (g/dL)   RDW 40.9  81.1 - 91.4 (%)   Platelets 227  150 - 400 (K/uL)  DIFFERENTIAL      Component Value Range   Neutrophils Relative 64  43 - 77 (%)   Neutro Abs 4.9  1.7 - 7.7 (K/uL)   Lymphocytes Relative 22  12 - 46 (%)   Lymphs Abs 1.7  0.7 - 4.0 (K/uL)   Monocytes Relative 9  3 - 12 (%)   Monocytes Absolute 0.7  0.1 - 1.0 (K/uL)   Eosinophils Relative 5  0 - 5 (%)   Eosinophils Absolute 0.4  0.0 - 0.7 (K/uL)   Basophils Relative 1  0 - 1 (%)   Basophils Absolute 0.0  0.0 - 0.1 (K/uL)  BASIC METABOLIC PANEL      Component Value Range   Sodium 139  135 - 145 (mEq/L)   Potassium 3.5  3.5 - 5.1 (mEq/L)   Chloride 103  96 - 112 (mEq/L)   CO2 24  19 - 32 (mEq/L)   Glucose, Bld 95  70 - 99 (mg/dL)  BUN 18  6 - 23 (mg/dL)   Creatinine,  Ser 1.61  0.50 - 1.10 (mg/dL)   Calcium 9.5  8.4 - 09.6 (mg/dL)   GFR calc non Af Amer 81 (*) >90 (mL/min)   GFR calc Af Amer >90  >90 (mL/min)      Dg Hip Complete Left  01/17/2011  *RADIOLOGY REPORT*  Clinical Data: Fall.  Painful left hip.  LEFT HIP - COMPLETE 2+ VIEW  Comparison: 11/12/2005.  Findings: Left bipolar hip hemiarthroplasty is present.  There is no periprosthetic fracture identified.  Distal stem visualized and normal.  The left hip is located.  Pelvic rings appear intact. Right total hip arthroplasty appears within normal limits.  IMPRESSION: Uncomplicated left hip hemiarthroplasty.  No periprosthetic fracture.  Original Report Authenticated By: Andreas Newport, M.D.   Ct Hip Left Wo Contrast  01/17/2011  *RADIOLOGY REPORT*  Clinical Data: Fall.  Hip pain.  Painful weightbearing.  Evaluate for occult fracture.  CT OF THE LEFT HIP WITHOUT CONTRAST  Technique:  Multidetector CT imaging was performed according to the standard protocol. Multiplanar CT image reconstructions were also generated.  Comparison: 01/17/2011.  Findings: Left bipolar hip hemiarthroplasty is present.  No periprosthetic fracture is identified.  The distal stem is visualized.  Contusion versus scar tissue is present over the lateral left hip.  The acetabulum appears intact.  The left obturator ring appears intact.  Anterior and posterior columns of the left hip appear normal.  Lumbar spondylosis and facet arthrosis is present.  The visceral pelvis shows the iliofemoral atherosclerosis.  There is significant beam-hardening artifact from a bipolar hip hemiarthroplasty. Prosthetic left hip is located.  On the coronal images, there is a small buckle of the left sacral ala suspicious for nondisplaced fracture. Osteopenia.  IMPRESSION: 1.  Buckling of the left sacral ala only seen on coronal images, suspicious for nondisplaced fracture. 2.  Intact left bipolar hip hemiarthroplasty.  Left obturator ring shows no displaced  fracture.  Original Report Authenticated By: Andreas Newport, M.D.     No diagnosis found.    MDM  2:30 AM patient seen and evaluated. Patient in no acute distress.  X-rays negative for any acute fractures. Patient still with significant pains after standing and attempting to walk. She reports being comfortable while lying still in bed. Patient discussed with attending physician and at this time will order CT scan for closer valuation of hip for possible occult fracture. Patient also given pain medication  CT scan shows signs for possible buckling fracture of left sacral ala. Have informed patient of diagnoses and findings. She is feeling much better at this time. She is ambulatory without assistance down the hall to the bathroom. Patient has her husband at home with her and reports her daughter is just down the street. She feels able to return home with pain medications and followup with her orthopedic doctor.     Date: 01/17/2011  Rate: 55  Rhythm: sinus bradycardia  QRS Axis: left  Intervals: normal  ST/T Wave abnormalities: normal  Conduction Disutrbances:first-degree A-V block   Narrative Interpretation: Left ventricular hypertrophy  Old EKG Reviewed: No significant changes from 03/06/2010    Angus Seller, PA 01/17/11 2044

## 2011-01-18 NOTE — ED Provider Notes (Signed)
Pt had fall in the last 24 hours onto her L hip - has had pain with ambulation, improvement with rest - no obvious deformity and no head injury.   PE: Pain with palpation over hip, ambulattes with discomfort but by self in ED   Imaging - no frx on plain film, ? frx on CT but non displaced and not involving the joint space   MPRESSION: 1. Buckling of the left sacral ala only seen on coronal images, suspicious for nondisplaced fracture. 2. Intact left bipolar hip hemiarthroplasty. Left obturator ring shows no displaced fracture. Original Report Authenticated By: Andreas Newport, M.D.   Referral to Ortho given. Pain control  Medical screening examination/treatment/procedure(s) were conducted as a shared visit with non-physician practitioner(s) and myself.  I personally evaluated the patient during the encounter    Vida Roller, MD 01/18/11 [phone]

## 2011-01-22 DIAGNOSIS — S3210XA Unspecified fracture of sacrum, initial encounter for closed fracture: Secondary | ICD-10-CM | POA: Diagnosis not present

## 2011-01-22 DIAGNOSIS — S322XXA Fracture of coccyx, initial encounter for closed fracture: Secondary | ICD-10-CM | POA: Diagnosis not present

## 2011-01-30 ENCOUNTER — Encounter: Payer: Medicare Other | Admitting: *Deleted

## 2011-02-12 ENCOUNTER — Ambulatory Visit (INDEPENDENT_AMBULATORY_CARE_PROVIDER_SITE_OTHER): Payer: Medicare Other | Admitting: *Deleted

## 2011-02-12 DIAGNOSIS — Z7901 Long term (current) use of anticoagulants: Secondary | ICD-10-CM | POA: Diagnosis not present

## 2011-02-12 DIAGNOSIS — I4891 Unspecified atrial fibrillation: Secondary | ICD-10-CM | POA: Diagnosis not present

## 2011-02-12 LAB — POCT INR: INR: 2.9

## 2011-02-28 DIAGNOSIS — I10 Essential (primary) hypertension: Secondary | ICD-10-CM | POA: Diagnosis not present

## 2011-02-28 DIAGNOSIS — E785 Hyperlipidemia, unspecified: Secondary | ICD-10-CM | POA: Diagnosis not present

## 2011-02-28 DIAGNOSIS — R82998 Other abnormal findings in urine: Secondary | ICD-10-CM | POA: Diagnosis not present

## 2011-02-28 DIAGNOSIS — M949 Disorder of cartilage, unspecified: Secondary | ICD-10-CM | POA: Diagnosis not present

## 2011-02-28 DIAGNOSIS — M899 Disorder of bone, unspecified: Secondary | ICD-10-CM | POA: Diagnosis not present

## 2011-03-05 DIAGNOSIS — Z1212 Encounter for screening for malignant neoplasm of rectum: Secondary | ICD-10-CM | POA: Diagnosis not present

## 2011-03-12 ENCOUNTER — Ambulatory Visit (INDEPENDENT_AMBULATORY_CARE_PROVIDER_SITE_OTHER): Payer: Medicare Other | Admitting: *Deleted

## 2011-03-12 DIAGNOSIS — Z7901 Long term (current) use of anticoagulants: Secondary | ICD-10-CM

## 2011-03-12 DIAGNOSIS — I4891 Unspecified atrial fibrillation: Secondary | ICD-10-CM

## 2011-03-12 LAB — POCT INR: INR: 2.4

## 2011-03-25 DIAGNOSIS — R059 Cough, unspecified: Secondary | ICD-10-CM | POA: Diagnosis not present

## 2011-03-25 DIAGNOSIS — R05 Cough: Secondary | ICD-10-CM | POA: Diagnosis not present

## 2011-03-25 DIAGNOSIS — I10 Essential (primary) hypertension: Secondary | ICD-10-CM | POA: Diagnosis not present

## 2011-04-01 DIAGNOSIS — I10 Essential (primary) hypertension: Secondary | ICD-10-CM | POA: Diagnosis not present

## 2011-04-01 DIAGNOSIS — Z0000 Encounter for general adult medical examination without abnormal findings: Secondary | ICD-10-CM | POA: Diagnosis not present

## 2011-04-01 DIAGNOSIS — E785 Hyperlipidemia, unspecified: Secondary | ICD-10-CM | POA: Diagnosis not present

## 2011-04-01 DIAGNOSIS — K3184 Gastroparesis: Secondary | ICD-10-CM | POA: Diagnosis not present

## 2011-04-07 ENCOUNTER — Emergency Department (HOSPITAL_COMMUNITY): Payer: Medicare Other

## 2011-04-07 ENCOUNTER — Other Ambulatory Visit: Payer: Self-pay

## 2011-04-07 ENCOUNTER — Encounter (HOSPITAL_COMMUNITY): Payer: Self-pay | Admitting: *Deleted

## 2011-04-07 ENCOUNTER — Inpatient Hospital Stay (HOSPITAL_COMMUNITY)
Admission: EM | Admit: 2011-04-07 | Discharge: 2011-04-09 | DRG: 392 | Disposition: A | Discharge status: Home / Self Care | Payer: Medicare Other | Attending: Internal Medicine | Admitting: Internal Medicine

## 2011-04-07 DIAGNOSIS — N281 Cyst of kidney, acquired: Secondary | ICD-10-CM | POA: Diagnosis not present

## 2011-04-07 DIAGNOSIS — Z96659 Presence of unspecified artificial knee joint: Secondary | ICD-10-CM

## 2011-04-07 DIAGNOSIS — K3184 Gastroparesis: Secondary | ICD-10-CM | POA: Diagnosis present

## 2011-04-07 DIAGNOSIS — Z96649 Presence of unspecified artificial hip joint: Secondary | ICD-10-CM

## 2011-04-07 DIAGNOSIS — Z87891 Personal history of nicotine dependence: Secondary | ICD-10-CM

## 2011-04-07 DIAGNOSIS — M129 Arthropathy, unspecified: Secondary | ICD-10-CM | POA: Diagnosis present

## 2011-04-07 DIAGNOSIS — I4891 Unspecified atrial fibrillation: Secondary | ICD-10-CM

## 2011-04-07 DIAGNOSIS — K589 Irritable bowel syndrome without diarrhea: Secondary | ICD-10-CM

## 2011-04-07 DIAGNOSIS — R109 Unspecified abdominal pain: Secondary | ICD-10-CM

## 2011-04-07 DIAGNOSIS — R112 Nausea with vomiting, unspecified: Secondary | ICD-10-CM | POA: Diagnosis not present

## 2011-04-07 DIAGNOSIS — E876 Hypokalemia: Secondary | ICD-10-CM | POA: Diagnosis present

## 2011-04-07 DIAGNOSIS — A0811 Acute gastroenteropathy due to Norwalk agent: Principal | ICD-10-CM | POA: Diagnosis present

## 2011-04-07 DIAGNOSIS — E860 Dehydration: Secondary | ICD-10-CM | POA: Diagnosis not present

## 2011-04-07 DIAGNOSIS — I10 Essential (primary) hypertension: Secondary | ICD-10-CM | POA: Insufficient documentation

## 2011-04-07 DIAGNOSIS — Z888 Allergy status to other drugs, medicaments and biological substances status: Secondary | ICD-10-CM

## 2011-04-07 DIAGNOSIS — R197 Diarrhea, unspecified: Secondary | ICD-10-CM | POA: Diagnosis not present

## 2011-04-07 DIAGNOSIS — Z4789 Encounter for other orthopedic aftercare: Secondary | ICD-10-CM | POA: Diagnosis not present

## 2011-04-07 DIAGNOSIS — Z7901 Long term (current) use of anticoagulants: Secondary | ICD-10-CM

## 2011-04-07 DIAGNOSIS — Z79899 Other long term (current) drug therapy: Secondary | ICD-10-CM

## 2011-04-07 DIAGNOSIS — E785 Hyperlipidemia, unspecified: Secondary | ICD-10-CM

## 2011-04-07 DIAGNOSIS — F411 Generalized anxiety disorder: Secondary | ICD-10-CM | POA: Diagnosis present

## 2011-04-07 DIAGNOSIS — B1920 Unspecified viral hepatitis C without hepatic coma: Secondary | ICD-10-CM | POA: Diagnosis present

## 2011-04-07 DIAGNOSIS — K769 Liver disease, unspecified: Secondary | ICD-10-CM | POA: Diagnosis not present

## 2011-04-07 DIAGNOSIS — I480 Paroxysmal atrial fibrillation: Secondary | ICD-10-CM | POA: Insufficient documentation

## 2011-04-07 LAB — CBC
HCT: 43 % (ref 36.0–46.0)
Hemoglobin: 14.8 g/dL (ref 12.0–15.0)
MCH: 30.3 pg (ref 26.0–34.0)
MCHC: 34.4 g/dL (ref 30.0–36.0)
MCV: 87.9 fL (ref 78.0–100.0)
Platelets: 240 10*3/uL (ref 150–400)
RBC: 4.89 MIL/uL (ref 3.87–5.11)
RDW: 14.1 % (ref 11.5–15.5)
WBC: 11.8 10*3/uL — ABNORMAL HIGH (ref 4.0–10.5)

## 2011-04-07 LAB — BASIC METABOLIC PANEL
BUN: 24 mg/dL — ABNORMAL HIGH (ref 6–23)
CO2: 23 mEq/L (ref 19–32)
Calcium: 9.7 mg/dL (ref 8.4–10.5)
Chloride: 102 mEq/L (ref 96–112)
Creatinine, Ser: 0.63 mg/dL (ref 0.50–1.10)
GFR calc Af Amer: >90 mL/min (ref >90)
GFR calc non Af Amer: 84 mL/min — ABNORMAL LOW (ref >90)
Glucose, Bld: 126 mg/dL — ABNORMAL HIGH (ref 70–99)
Potassium: 3.1 mEq/L — ABNORMAL LOW (ref 3.5–5.1)
Sodium: 140 mEq/L (ref 135–145)

## 2011-04-07 LAB — URINALYSIS, ROUTINE W REFLEX MICROSCOPIC
Glucose, UA: NEGATIVE mg/dL
Hgb urine dipstick: NEGATIVE
Ketones, ur: 40 mg/dL — AB
Nitrite: NEGATIVE
Protein, ur: NEGATIVE mg/dL
Specific Gravity, Urine: 1.029 (ref 1.005–1.030)
Urobilinogen, UA: 0.2 mg/dL (ref 0.0–1.0)
pH: 5.5 (ref 5.0–8.0)

## 2011-04-07 LAB — HEPATIC FUNCTION PANEL
ALT: 14 U/L (ref 0–35)
AST: 22 U/L (ref 0–37)
Albumin: 3.9 g/dL (ref 3.5–5.2)
Alkaline Phosphatase: 120 U/L — ABNORMAL HIGH (ref 39–117)
Bilirubin, Direct: 0.2 mg/dL (ref 0.0–0.3)
Indirect Bilirubin: 0.8 mg/dL (ref 0.3–0.9)
Total Bilirubin: 1 mg/dL (ref 0.3–1.2)
Total Protein: 6.8 g/dL (ref 6.0–8.3)

## 2011-04-07 LAB — POCT I-STAT TROPONIN I: Troponin i, poc: 0 ng/mL (ref 0.00–0.08)

## 2011-04-07 LAB — LACTIC ACID, PLASMA: Lactic Acid, Venous: 2.3 mmol/L — ABNORMAL HIGH (ref 0.5–2.2)

## 2011-04-07 LAB — PROTIME-INR
INR: 2.02 — ABNORMAL HIGH (ref 0.00–1.49)
Prothrombin Time: 23.2 seconds — ABNORMAL HIGH (ref 11.6–15.2)

## 2011-04-07 LAB — URINE MICROSCOPIC-ADD ON

## 2011-04-07 LAB — DIFFERENTIAL
Basophils Absolute: 0 10*3/uL (ref 0.0–0.1)
Basophils Relative: 0 % (ref 0–1)
Eosinophils Absolute: 0 10*3/uL (ref 0.0–0.7)
Eosinophils Relative: 0 % (ref 0–5)
Lymphocytes Relative: 4 % — ABNORMAL LOW (ref 12–46)
Lymphs Abs: 0.4 10*3/uL — ABNORMAL LOW (ref 0.7–4.0)
Monocytes Absolute: 0.2 10*3/uL (ref 0.1–1.0)
Monocytes Relative: 2 % — ABNORMAL LOW (ref 3–12)
Neutro Abs: 11.1 10*3/uL — ABNORMAL HIGH (ref 1.7–7.7)
Neutrophils Relative %: 94 % — ABNORMAL HIGH (ref 43–77)

## 2011-04-07 LAB — OCCULT BLOOD, POC DEVICE: Fecal Occult Bld: NEGATIVE

## 2011-04-07 LAB — LIPASE, BLOOD: Lipase: 47 U/L (ref 11–59)

## 2011-04-07 MED ORDER — MORPHINE SULFATE 4 MG/ML IJ SOLN: 6.0000 mg | Freq: Once | INTRAMUSCULAR | Status: DC

## 2011-04-07 MED ORDER — ONDANSETRON HCL 4 MG/2ML IJ SOLN
4.0000 mg | Freq: Once | INTRAMUSCULAR | Status: AC
  Administered 2011-04-07: 4 mg via INTRAVENOUS
  Filled 2011-04-07: qty 2

## 2011-04-07 MED ORDER — MORPHINE SULFATE 4 MG/ML IJ SOLN
4.0000 mg | Freq: Once | INTRAMUSCULAR | Status: AC
  Administered 2011-04-07: 4 mg via INTRAVENOUS
  Filled 2011-04-07: qty 1

## 2011-04-07 MED ORDER — SODIUM CHLORIDE 0.9 % IV SOLN
1000.0000 mL | Freq: Once | INTRAVENOUS | Status: AC
  Administered 2011-04-07: 1000 mL via INTRAVENOUS

## 2011-04-07 MED ORDER — MORPHINE SULFATE 4 MG/ML IJ SOLN
6.0000 mg | Freq: Once | INTRAMUSCULAR | Status: AC
  Administered 2011-04-07: 6 mg via INTRAVENOUS
  Filled 2011-04-07: qty 2

## 2011-04-07 MED ORDER — DIPHENOXYLATE-ATROPINE 2.5-0.025 MG PO TABS
1.0000 | ORAL_TABLET | Freq: Once | ORAL | Status: AC
  Administered 2011-04-07: 1 via ORAL
  Filled 2011-04-07: qty 1

## 2011-04-07 NOTE — ED Notes (Signed)
The pt has  Vomited all day each time she drinks anything and she has had some flank pain for one week with a severe headache

## 2011-04-07 NOTE — ED Provider Notes (Signed)
Medical screening examination/treatment/procedure(s) were conducted as a shared visit with non-physician practitioner(s) and myself.  I personally evaluated the patient during the encounter 78 year old female presents to emergency department complaining of abdominal pain, vomiting, and diarrhea.  Today.  She denies blood in her vomit or diarrhea.  She was recently treated with antibiotics approximately a week ago.  She has not had a fever, or rash.  She denies a history of peptic ulcer disease.  On examination.  She appears uncomfortable.  Her heart and lungs are normal.  She has diffuse abdominal tenderness without peritoneal signs.  We will perform laboratory testing, and a CAT scan for further evaluation.  Cheri Guppy, MD 04/07/11 [phone]

## 2011-04-07 NOTE — ED Notes (Signed)
Patient transported to X-ray 

## 2011-04-07 NOTE — ED Notes (Signed)
The patient states that she feels "a little better" and that she is no longer nauseous.

## 2011-04-07 NOTE — ED Provider Notes (Signed)
History     CSN: [id number]  Arrival date & time 04/07/11  8119   First MD Initiated Contact with Patient 04/07/11 2001      Chief Complaint  Patient presents with  . Tachycardia  . Emesis    (Consider location/radiation/quality/duration/timing/severity/associated sxs/prior treatment) Patient is a 78 y.o. female presenting with vomiting. The history is provided by the patient.  Emesis  This is a new problem. Associated symptoms include abdominal pain and diarrhea. Pertinent negatives include no chills, no cough and no fever.  Pt states she woke up this morning with abdominal pain, nausea, vomiting, diarrhea, chest pain, shortness of breath. States has been vomiting all day, unable to keep anything down. Pt states she has hx of abdominal "issues" and reflux and she is being followed by her PCP and by her gastroenterologist, but states this feels different. Pt denies fever, admits to chills. Denies urinary symptoms. Denies hematemesis or blood in her stool. She also finished a course of antibiotics about a week ago for a sinus infection.   Past Medical History  Diagnosis Date  . Hypertension   . Hyperlipidemia   . GERD (gastroesophageal reflux disease)     hx of  . Arthritis     right  . Atrial fibrillation   . Irritable bowel syndrome   . Meningioma   . Hepatitis C     Past Surgical History  Procedure Date  . Total hip arthroplasty 11/07    left  . Parotid gland tumor excision   . Total knee arthroplasty   . Transthoracic echocardiogram 10/25/2009    EF 55-60%  . Cardiovascular stress test 11/14/2006    EF 67%    Family History  Problem Relation Age of Onset  . Heart disease Brother     History  Substance Use Topics  . Smoking status: Former Smoker -- 0.3 packs/day for 15 years    Types: Cigarettes    Quit date: 06/07/1980  . Smokeless tobacco: Never Used  . Alcohol Use: No    OB History    Grav Para Term Preterm Abortions TAB SAB Ect Mult Living          Review of Systems  Constitutional: Negative for fever and chills.  HENT: Negative for congestion, sore throat, neck pain and neck stiffness.   Eyes: Negative.   Respiratory: Positive for chest tightness and shortness of breath. Negative for cough.   Cardiovascular: Positive for chest pain. Negative for palpitations and leg swelling.  Gastrointestinal: Positive for nausea, vomiting, abdominal pain and diarrhea.  Genitourinary: Negative for dysuria, urgency and flank pain.  Musculoskeletal: Negative.   Skin: Negative.   Neurological: Positive for dizziness and light-headedness.  Psychiatric/Behavioral: Negative.     Allergies  Amoxicillin; Cephalexin; Codeine; and Reglan  Home Medications   Current Outpatient Rx  Name Route Sig Dispense Refill  . CALCIUM CARBONATE 600 MG PO TABS Oral Take 600 mg by mouth 2 (two) times daily with a meal.      . ERYTHROMYCIN BASE 250 MG PO TABS Oral Take 1 tablet by mouth Twice daily.    Marland Kitchen FLECAINIDE ACETATE 50 MG PO TABS Oral Take 50 mg by mouth 2 (two) times daily.    Marland Kitchen HYDROCHLOROTHIAZIDE 25 MG PO TABS Oral Take 25 mg by mouth daily.    Marland Kitchen OMEPRAZOLE 40 MG PO CPDR Oral Take 40 mg by mouth 2 (two) times daily.      . WARFARIN SODIUM 4 MG PO TABS Oral Take 4 mg  by mouth daily. Take 1.5 tabs or 6 mg on Wednesdays      BP 137/60  Pulse 63  Temp(Src) 98 F (36.7 C) (Oral)  Resp 18  SpO2 98%  Physical Exam  Nursing note and vitals reviewed. Constitutional: She is oriented to person, place, and time. She appears well-developed and well-nourished.       Uncomfortable appearing, in pain.  HENT:  Head: Normocephalic and atraumatic.  Eyes: Conjunctivae are normal.  Neck: Neck supple.  Cardiovascular: Normal rate, regular rhythm and normal heart sounds.   Pulmonary/Chest: Effort normal and breath sounds normal. No respiratory distress. She has no wheezes. She has no rales.  Abdominal: Soft. Bowel sounds are normal. There is no rebound and  no guarding.       Diffuse abdominal tenderness, LUQ abdominal tenderness  Musculoskeletal: Normal range of motion.  Neurological: She is alert and oriented to person, place, and time.  Skin: Skin is warm and dry.  Psychiatric: She has a normal mood and affect.    ED Course  Procedures (including critical care time)  Pt seen and examined. She appears very uncomfortable, vomiting bilious emesis. Abdomen diffusely tender, but worse in LUQ. Will get labs, pain medications. Fluids. X-ray of abdomen and chest. ECG. Will monitor closely  10:25 PM Pt reassesed. Slightly elevated lactate. Pain improved with morphine. Slightly elevated WBC. Will get CT abd/pelvis for further evaluation Results for orders placed during the hospital encounter of 04/07/11  BASIC METABOLIC PANEL      Component Value Range   Sodium 140  135 - 145 (mEq/L)   Potassium 3.1 (*) 3.5 - 5.1 (mEq/L)   Chloride 102  96 - 112 (mEq/L)   CO2 23  19 - 32 (mEq/L)   Glucose, Bld 126 (*) 70 - 99 (mg/dL)   BUN 24 (*) 6 - 23 (mg/dL)   Creatinine, Ser 1.61  0.50 - 1.10 (mg/dL)   Calcium 9.7  8.4 - 09.6 (mg/dL)   GFR calc non Af Amer 84 (*) >90 (mL/min)   GFR calc Af Amer >90  >90 (mL/min)  CBC      Component Value Range   WBC 11.8 (*) 4.0 - 10.5 (K/uL)   RBC 4.89  3.87 - 5.11 (MIL/uL)   Hemoglobin 14.8  12.0 - 15.0 (g/dL)   HCT 04.5  40.9 - 81.1 (%)   MCV 87.9  78.0 - 100.0 (fL)   MCH 30.3  26.0 - 34.0 (pg)   MCHC 34.4  30.0 - 36.0 (g/dL)   RDW 91.4  78.2 - 95.6 (%)   Platelets 240  150 - 400 (K/uL)  DIFFERENTIAL      Component Value Range   Neutrophils Relative 94 (*) 43 - 77 (%)   Neutro Abs 11.1 (*) 1.7 - 7.7 (K/uL)   Lymphocytes Relative 4 (*) 12 - 46 (%)   Lymphs Abs 0.4 (*) 0.7 - 4.0 (K/uL)   Monocytes Relative 2 (*) 3 - 12 (%)   Monocytes Absolute 0.2  0.1 - 1.0 (K/uL)   Eosinophils Relative 0  0 - 5 (%)   Eosinophils Absolute 0.0  0.0 - 0.7 (K/uL)   Basophils Relative 0  0 - 1 (%)   Basophils Absolute 0.0   0.0 - 0.1 (K/uL)  HEPATIC FUNCTION PANEL      Component Value Range   Total Protein 6.8  6.0 - 8.3 (g/dL)   Albumin 3.9  3.5 - 5.2 (g/dL)   AST 22  0 - 37 (U/L)  ALT 14  0 - 35 (U/L)   Alkaline Phosphatase 120 (*) 39 - 117 (U/L)   Total Bilirubin 1.0  0.3 - 1.2 (mg/dL)   Bilirubin, Direct 0.2  0.0 - 0.3 (mg/dL)   Indirect Bilirubin 0.8  0.3 - 0.9 (mg/dL)  LIPASE, BLOOD      Component Value Range   Lipase 47  11 - 59 (U/L)  PROTIME-INR      Component Value Range   Prothrombin Time 23.2 (*) 11.6 - 15.2 (seconds)   INR 2.02 (*) 0.00 - 1.49   LACTIC ACID, PLASMA      Component Value Range   Lactic Acid, Venous 2.3 (*) 0.5 - 2.2 (mmol/L)  POCT I-STAT TROPONIN I      Component Value Range   Troponin i, poc 0.00  0.00 - 0.08 (ng/mL)   Comment 3           OCCULT BLOOD, POC DEVICE      Component Value Range   Fecal Occult Bld NEGATIVE     Dg Abd Acute W/chest  04/07/2011  *RADIOLOGY REPORT*  Clinical Data: Sickle last week but today started having bilateral abdominal pain, shortness of breath, nausea, vomiting, diarrhea, and headache.  ACUTE ABDOMEN SERIES (ABDOMEN 2 VIEW & CHEST 1 VIEW)  Comparison: Chest 03/06/2010  Findings: Normal heart size and pulmonary vascularity. Emphysematous changes and scattered fibrosis in the lungs.  No focal airspace consolidation.  No blunting of costophrenic angles. No pneumothorax.  Calcified and tortuous aorta.  Paucity of gas in the bowel.  No evidence of gaseous distension of small or large bowel.  Multiple small gas bubbles are demonstrated within bowel on the upright view.  Dilated fluid filled bowel is not excluded.  No free intra-abdominal air.  No radiopaque stones. Vascular calcifications.  Postoperative changes in both hips. Calcified pelvic phleboliths.  Thoracolumbar scoliosis with diffuse degenerative change.  IMPRESSION: No evidence of active pulmonary disease.  Nonspecific bowel gas pattern.  No specific evidence of bowel obstruction although  fluid filled dilated bowel is not excluded.  Original Report Authenticated By: Marlon Pel, M.D.     Date: 04/07/2011  Rate: 72  Rhythm: normal sinus rhythm  QRS Axis: left  Intervals: normal  ST/T Wave abnormalities: early repolarization  Conduction Disutrbances:none  Narrative Interpretation: Q waves anteroseptally  Old EKG Reviewed: changes noted bradycardia resolved  Pt continues to feel nauseated, she is having epigastric burning. Given her Labs and CT does not show any major abnormalities, I spoke with Dr. Patria Mane who came with me to see the pt. She is feeling some improvement, but worried about going home and pain and nausea coming back. i will call Dr. Silvano Rusk team to see if they can admit her for obs and further treatment.  1:38 AM Spoke with Dr. Wylene Simmer, he will admit.     No diagnosis found.    MDM          Lottie Mussel, PA 04/08/11 [phone]

## 2011-04-07 NOTE — ED Notes (Signed)
PA at bedside.

## 2011-04-07 NOTE — ED Notes (Signed)
MD at bedside. 

## 2011-04-08 ENCOUNTER — Encounter (HOSPITAL_COMMUNITY): Payer: Self-pay | Admitting: Radiology

## 2011-04-08 ENCOUNTER — Emergency Department (HOSPITAL_COMMUNITY): Payer: Medicare Other

## 2011-04-08 DIAGNOSIS — Z96649 Presence of unspecified artificial hip joint: Secondary | ICD-10-CM | POA: Diagnosis not present

## 2011-04-08 DIAGNOSIS — R109 Unspecified abdominal pain: Secondary | ICD-10-CM | POA: Diagnosis not present

## 2011-04-08 DIAGNOSIS — K3184 Gastroparesis: Secondary | ICD-10-CM | POA: Diagnosis not present

## 2011-04-08 DIAGNOSIS — Z87891 Personal history of nicotine dependence: Secondary | ICD-10-CM | POA: Diagnosis not present

## 2011-04-08 DIAGNOSIS — I10 Essential (primary) hypertension: Secondary | ICD-10-CM | POA: Diagnosis present

## 2011-04-08 DIAGNOSIS — B1920 Unspecified viral hepatitis C without hepatic coma: Secondary | ICD-10-CM | POA: Diagnosis present

## 2011-04-08 DIAGNOSIS — R110 Nausea: Secondary | ICD-10-CM | POA: Diagnosis not present

## 2011-04-08 DIAGNOSIS — R112 Nausea with vomiting, unspecified: Secondary | ICD-10-CM | POA: Diagnosis present

## 2011-04-08 DIAGNOSIS — Z4789 Encounter for other orthopedic aftercare: Secondary | ICD-10-CM | POA: Diagnosis not present

## 2011-04-08 DIAGNOSIS — E876 Hypokalemia: Secondary | ICD-10-CM | POA: Diagnosis not present

## 2011-04-08 DIAGNOSIS — Z7901 Long term (current) use of anticoagulants: Secondary | ICD-10-CM | POA: Diagnosis not present

## 2011-04-08 DIAGNOSIS — K589 Irritable bowel syndrome without diarrhea: Secondary | ICD-10-CM | POA: Diagnosis present

## 2011-04-08 DIAGNOSIS — K769 Liver disease, unspecified: Secondary | ICD-10-CM | POA: Diagnosis not present

## 2011-04-08 DIAGNOSIS — Z79899 Other long term (current) drug therapy: Secondary | ICD-10-CM | POA: Diagnosis not present

## 2011-04-08 DIAGNOSIS — R197 Diarrhea, unspecified: Secondary | ICD-10-CM | POA: Diagnosis not present

## 2011-04-08 DIAGNOSIS — M129 Arthropathy, unspecified: Secondary | ICD-10-CM | POA: Diagnosis present

## 2011-04-08 DIAGNOSIS — Z96659 Presence of unspecified artificial knee joint: Secondary | ICD-10-CM | POA: Diagnosis not present

## 2011-04-08 DIAGNOSIS — Z888 Allergy status to other drugs, medicaments and biological substances status: Secondary | ICD-10-CM | POA: Diagnosis not present

## 2011-04-08 DIAGNOSIS — E860 Dehydration: Secondary | ICD-10-CM | POA: Diagnosis not present

## 2011-04-08 DIAGNOSIS — E785 Hyperlipidemia, unspecified: Secondary | ICD-10-CM | POA: Diagnosis present

## 2011-04-08 DIAGNOSIS — A0811 Acute gastroenteropathy due to Norwalk agent: Secondary | ICD-10-CM | POA: Diagnosis not present

## 2011-04-08 DIAGNOSIS — I4891 Unspecified atrial fibrillation: Secondary | ICD-10-CM | POA: Diagnosis not present

## 2011-04-08 DIAGNOSIS — N281 Cyst of kidney, acquired: Secondary | ICD-10-CM | POA: Diagnosis not present

## 2011-04-08 DIAGNOSIS — F411 Generalized anxiety disorder: Secondary | ICD-10-CM | POA: Diagnosis present

## 2011-04-08 MED ORDER — WARFARIN SODIUM 4 MG PO TABS
4.0000 mg | ORAL_TABLET | Freq: Every day | ORAL | Status: DC
  Administered 2011-04-08: 4 mg via ORAL
  Filled 2011-04-08 (×2): qty 1

## 2011-04-08 MED ORDER — CALCIUM CARBONATE 600 MG PO TABS: 600.0000 mg | ORAL_TABLET | Freq: Two times a day (BID) | ORAL | Status: DC

## 2011-04-08 MED ORDER — ALUM & MAG HYDROXIDE-SIMETH 200-200-20 MG/5ML PO SUSP: 30.0000 mL | Freq: Four times a day (QID) | ORAL | Status: DC | PRN

## 2011-04-08 MED ORDER — WARFARIN - PHARMACIST DOSING INPATIENT
Freq: Every day | Status: DC
  Administered 2011-04-08: 18:00:00

## 2011-04-08 MED ORDER — POTASSIUM CHLORIDE IN NACL 40-0.9 MEQ/L-% IV SOLN
INTRAVENOUS | Status: DC
  Administered 2011-04-08: 50 mL/h via INTRAVENOUS
  Administered 2011-04-08: 07:00:00 via INTRAVENOUS
  Filled 2011-04-08 (×3): qty 1000

## 2011-04-08 MED ORDER — ONDANSETRON HCL 4 MG PO TABS: 4.0000 mg | ORAL_TABLET | Freq: Four times a day (QID) | ORAL | Status: DC | PRN

## 2011-04-08 MED ORDER — POTASSIUM CHLORIDE 10 MEQ/100ML IV SOLN
10.0000 meq | Freq: Once | INTRAVENOUS | Status: AC
  Administered 2011-04-08: 10 meq via INTRAVENOUS

## 2011-04-08 MED ORDER — CALCIUM CARBONATE 600 MG PO TABS
600.0000 mg | ORAL_TABLET | Freq: Two times a day (BID) | ORAL | Status: DC
  Filled 2011-04-08 (×4): qty 1

## 2011-04-08 MED ORDER — FLECAINIDE ACETATE 50 MG PO TABS
50.0000 mg | ORAL_TABLET | Freq: Two times a day (BID) | ORAL | Status: DC
  Administered 2011-04-08 – 2011-04-09 (×3): 50 mg via ORAL
  Filled 2011-04-08 (×5): qty 1

## 2011-04-08 MED ORDER — ACETAMINOPHEN 650 MG RE SUPP: 650.0000 mg | Freq: Four times a day (QID) | RECTAL | Status: DC | PRN

## 2011-04-08 MED ORDER — ACETAMINOPHEN 325 MG PO TABS
650.0000 mg | ORAL_TABLET | Freq: Four times a day (QID) | ORAL | Status: DC | PRN
  Administered 2011-04-08: 650 mg via ORAL
  Filled 2011-04-08: qty 2

## 2011-04-08 MED ORDER — SODIUM CHLORIDE 0.9 % IV SOLN
Freq: Once | INTRAVENOUS | Status: AC
  Administered 2011-04-08: 02:00:00 via INTRAVENOUS

## 2011-04-08 MED ORDER — ZOLPIDEM TARTRATE 5 MG PO TABS: 5.0000 mg | ORAL_TABLET | Freq: Every evening | ORAL | Status: DC | PRN

## 2011-04-08 MED ORDER — SPIRONOLACTONE 50 MG PO TABS
50.0000 mg | ORAL_TABLET | Freq: Once | ORAL | Status: AC
  Administered 2011-04-08: 50 mg via ORAL
  Filled 2011-04-08: qty 1

## 2011-04-08 MED ORDER — CALCIUM CARBONATE 1250 (500 CA) MG PO TABS
1.0000 | ORAL_TABLET | Freq: Two times a day (BID) | ORAL | Status: DC
  Administered 2011-04-08 – 2011-04-09 (×3): 500 mg via ORAL
  Filled 2011-04-08 (×5): qty 1

## 2011-04-08 MED ORDER — ENOXAPARIN SODIUM 40 MG/0.4ML ~~LOC~~ SOLN
40.0000 mg | SUBCUTANEOUS | Status: DC
  Filled 2011-04-08: qty 0.4

## 2011-04-08 MED ORDER — ONDANSETRON HCL 4 MG/2ML IJ SOLN: 4.0000 mg | Freq: Four times a day (QID) | INTRAMUSCULAR | Status: DC | PRN

## 2011-04-08 MED ORDER — ERYTHROMYCIN BASE 250 MG PO TABS
250.0000 mg | ORAL_TABLET | Freq: Three times a day (TID) | ORAL | Status: DC
  Administered 2011-04-08 – 2011-04-09 (×5): 250 mg via ORAL
  Filled 2011-04-08 (×10): qty 1

## 2011-04-08 MED ORDER — IOHEXOL 300 MG/ML  SOLN
100.0000 mL | Freq: Once | INTRAMUSCULAR | Status: AC | PRN
  Administered 2011-04-08: 100 mL via INTRAVENOUS

## 2011-04-08 MED ORDER — IOHEXOL 300 MG/ML  SOLN: 20.0000 mL | INTRAMUSCULAR | Status: DC

## 2011-04-08 MED ORDER — PANTOPRAZOLE SODIUM 40 MG IV SOLR
40.0000 mg | Freq: Two times a day (BID) | INTRAVENOUS | Status: DC
  Administered 2011-04-08 (×2): 40 mg via INTRAVENOUS
  Filled 2011-04-08 (×4): qty 40

## 2011-04-08 MED ORDER — SODIUM CHLORIDE 0.9 % IV SOLN: Freq: Once | INTRAVENOUS | Status: DC

## 2011-04-08 MED ORDER — WARFARIN - PHYSICIAN DOSING INPATIENT: Freq: Every day | Status: DC

## 2011-04-08 MED ORDER — FLECAINIDE ACETATE 50 MG PO TABS: 50.0000 mg | ORAL_TABLET | Freq: Two times a day (BID) | ORAL | Status: DC

## 2011-04-08 MED ORDER — POTASSIUM CHLORIDE 10 MEQ/100ML IV SOLN
10.0000 meq | INTRAVENOUS | Status: AC
  Administered 2011-04-08: 10 meq via INTRAVENOUS
  Filled 2011-04-08: qty 200

## 2011-04-08 NOTE — ED Notes (Signed)
Called and gave report to Arianna. 

## 2011-04-08 NOTE — Progress Notes (Signed)
UR Completed.  Gloria [name] [name] [phone] 04/08/2011  

## 2011-04-08 NOTE — Progress Notes (Signed)
Completed assessment at [facility]

## 2011-04-08 NOTE — Progress Notes (Addendum)
ANTICOAGULATION CONSULT NOTE - Initial Consult  Pharmacy Consult for coumadin  Indication: atrial fibrillation  Allergies  Allergen Reactions  . Amoxicillin Other (See Comments)    unknown  . Cephalexin Other (See Comments)    unknown  . Codeine Other (See Comments)    unknown  . Reglan Other (See Comments)    unknown    Patient Measurements: Height: 5\' 6"  (167.6 cm) Weight: 151 lb 14.4 oz (68.9 kg) IBW/kg (Calculated) : 59.3  Heparin Dosing Weight:   Vital Signs: Temp: 98.3 F (36.8 C) (04/01 0409) Temp src: Oral (04/01 0409) BP: 127/57 mmHg (04/01 0409) Pulse Rate: 54  (04/01 0409)  Labs:  Alvira Philips 04/07/11 2028  HGB 14.8  HCT 43.0  PLT 240  APTT --  LABPROT 23.2*  INR 2.02*  HEPARINUNFRC --  CREATININE 0.63  CKTOTAL --  CKMB --  TROPONINI --   Estimated Creatinine Clearance: 54.3 ml/min (by C-G formula based on Cr of 0.63).  Medical History: Past Medical History  Diagnosis Date  . Hypertension   . Hyperlipidemia   . GERD (gastroesophageal reflux disease)     hx of  . Arthritis     right  . Atrial fibrillation   . Irritable bowel syndrome   . Meningioma   . Hepatitis C     Medications:  Prescriptions prior to admission  Medication Sig Dispense Refill  . calcium carbonate (OS-CAL) 600 MG TABS Take 600 mg by mouth 2 (two) times daily with a meal.        . erythromycin (E-MYCIN) 250 MG tablet Take 1 tablet by mouth Twice daily.      . flecainide (TAMBOCOR) 50 MG tablet Take 50 mg by mouth 2 (two) times daily.      . hydrochlorothiazide (HYDRODIURIL) 25 MG tablet Take 25 mg by mouth daily.      Marland Kitchen omeprazole (PRILOSEC) 40 MG capsule Take 40 mg by mouth 2 (two) times daily.        Marland Kitchen warfarin (COUMADIN) 4 MG tablet Take 4 mg by mouth daily. Take 1.5 tabs or 6 mg on Wednesdays        Assessment:   78 yo female admitted for probable norovirus on coumadin 4mg  daily for afib. Therapeutic inr on adx.  Goal of Therapy:  INR 2-3   Plan:  Continue  home dose of 4mg  daily.  Daily inr f/u   (may want to consider changing to iv warfarin  if transit time with norovirus results in incomplete absorption of po dose)   Janice Coffin 04/08/2011,6:13 AM

## 2011-04-08 NOTE — H&P (Signed)
Gloria [name] is an 78 y.o. female.   Chief Complaint: Nausea and vomiting, chronic gastroparesis HPI: Patient is a 78 year old female who comes to the ER after having escalating n/v/d today.  Is due to see GI Randa Evens) at 8:30 this AM with regards to her worsening gastroparesis, but felt like the past couple of hours is out of proportion with her initial issues for making that appointment.  The nausea seems to be the worst part for her although she has had numerous loose BM's over the course of today as well.  Has tried some OTC's without relief.  ER workup including imaging and labs is negative for a cause and only notes low potassium, which is being replaced by IV.  Past Medical History  Diagnosis Date  . Hypertension   . Hyperlipidemia   . GERD (gastroesophageal reflux disease)     hx of  . Arthritis     right  . Atrial fibrillation   . Irritable bowel syndrome   . Meningioma   . Hepatitis C     Past Surgical History  Procedure Date  . Total hip arthroplasty 11/07    left  . Parotid gland tumor excision   . Total knee arthroplasty   . Transthoracic echocardiogram 10/25/2009    EF 55-60%  . Cardiovascular stress test 11/14/2006    EF 67%    Family History  Problem Relation Age of Onset  . Heart disease Brother    Social History:  reports that she quit smoking about 30 years ago. Her smoking use included Cigarettes. She has a 4.5 pack-year smoking history. She has never used smokeless tobacco. She reports that she does not drink alcohol or use illicit drugs.  Allergies:  Allergies  Allergen Reactions  . Amoxicillin Other (See Comments)    unknown  . Cephalexin Other (See Comments)    unknown  . Codeine Other (See Comments)    unknown  . Reglan Other (See Comments)    unknown    Physical Exam  General appearance: elderly, NAD, groomed  Eyes  External: conjunctivae and lids normal Pupils: equal, round, reactive to light and accommodation  Ears, Nose and  Throat  External ears: normal, no lesions or deformities External nose: normal, no lesions or deformities Otoscopic: canals clear, tympanic membranes intact, no fluid Hearing: grossly intact Nasal: mucosa, septum, and turbinates normal Dental: good dentition Pharynx: tongue normal, protrudes mid line,  posterior pharynx without erythema or exudate  Neck  Neck: supple, no masses, trachea midline Thyroid: no nodules, masses, tenderness, or enlargement  Breasts  Breast inspection: no asymmetry, skin changes, or nipple discharge Breast palpation: no masses or tenderness  Respiratory  Respiratory effort: no intercostal retractions or use of accessory muscles Auscultation: no rales, rhonchi, or wheezes  Cardiovascular  Auscultation: S1, S2, no murmur, rub, or gallop Carotid arteries: pulses 2+, symmetric, no bruits Abdominal aorta: no enlargement or bruits Femoral arteries: pulses 2+, symmetric, no bruits Periph. circulation: no cyanosis, clubbing, edema  Gastrointestinal  Abdomen: soft, mild diffuse tenderness without rebound or guarding Liver and spleen: no enlargement or nodularity Hernia: no hernias  Genitourinary  Pap Smear decllines pelvic exam  Lymphatic  Neck: no cervical adenopathy Axillae: no axillary adenopathy  Musculoskeletal  Gait and station: normal Digits and nails: no clubbing, cyanosis, petechiae, or nodes Head and neck: normal alignment and mobility Spine, ribs, pelvis: normal alignment and mobility, no deformity RUE: normal ROM and strength, no joint enlargement or tenderness LUE: normal ROM  and strength, no joint enlargement or tenderness RLE: normal ROM and strength, no joint enlargement or tenderness LLE: normal ROM and strength, no joint enlargement or tenderness  Neurologic  Cranial nerves: II - XII grossly intact  Mental Status Exam  Judgment, insight: intact Orientation: oriented to time, place, and person Memory: intact Mood and affect:  Normal Mood Mentation: Alert & Oriented Hallucinations/Delusion: No evidence Medications Prior to Admission  Medication Dose Route Frequency Provider Last Rate Last Dose  . 0.9 %  sodium chloride infusion  1,000 mL Intravenous Once Hurman Horn, MD   1,000 mL at 04/07/11 2053  . 0.9 %  sodium chloride infusion   Intravenous Once Lyanne Co, MD      . diphenoxylate-atropine (LOMOTIL) 2.5-0.025 MG per tablet 1 tablet  1 tablet Oral Once Lottie Mussel, PA   1 tablet at 04/07/11 2301  . iohexol (OMNIPAQUE) 300 MG/ML solution 100 mL  100 mL Intravenous Once PRN Medication Radiologist, MD   100 mL at 04/08/11 0048  . iohexol (OMNIPAQUE) 300 MG/ML solution 20 mL  20 mL Oral Q1 Hr x 2 Medication Radiologist, MD      . morphine 4 MG/ML injection 4 mg  4 mg Intravenous Once Tatyana A Kirichenko, PA   4 mg at 04/07/11 2358  . morphine 4 MG/ML injection 6 mg  6 mg Intravenous Once Tatyana A Kirichenko, PA   6 mg at 04/07/11 2039  . ondansetron (ZOFRAN) injection 4 mg  4 mg Intravenous Once Tatyana A Kirichenko, PA   4 mg at 04/07/11 2053  . potassium chloride 10 mEq in 100 mL IVPB  10 mEq Intravenous Q1 Hr x 2 Lyanne Co, MD      . potassium chloride 10 mEq in 100 mL IVPB  10 mEq Intravenous Once Tatyana A Kirichenko, PA      . DISCONTD: 0.9 %  sodium chloride infusion   Intravenous Once Tatyana A Kirichenko, PA      . DISCONTD: morphine 4 MG/ML injection 6 mg  6 mg Intravenous Once Tatyana A Kirichenko, PA       Medications Prior to Admission  Medication Sig Dispense Refill  . calcium carbonate (OS-CAL) 600 MG TABS Take 600 mg by mouth 2 (two) times daily with a meal.        . erythromycin (E-MYCIN) 250 MG tablet Take 1 tablet by mouth Twice daily.      . flecainide (TAMBOCOR) 50 MG tablet Take 50 mg by mouth 2 (two) times daily.      . hydrochlorothiazide (HYDRODIURIL) 25 MG tablet Take 25 mg by mouth daily.      Marland Kitchen omeprazole (PRILOSEC) 40 MG capsule Take 40 mg by mouth 2 (two) times  daily.        Marland Kitchen warfarin (COUMADIN) 4 MG tablet Take 4 mg by mouth daily. Take 1.5 tabs or 6 mg on Wednesdays        Results for orders placed during the hospital encounter of 04/07/11 (from the past 48 hour(s))  BASIC METABOLIC PANEL     Status: Abnormal   Collection Time   04/07/11  8:28 PM      Component Value Range Comment   Sodium 140  135 - 145 (mEq/L)    Potassium 3.1 (*) 3.5 - 5.1 (mEq/L)    Chloride 102  96 - 112 (mEq/L)    CO2 23  19 - 32 (mEq/L)    Glucose, Bld 126 (*) 70 - 99 (mg/dL)  BUN 24 (*) 6 - 23 (mg/dL)    Creatinine, Ser 1.66  0.50 - 1.10 (mg/dL)    Calcium 9.7  8.4 - 10.5 (mg/dL)    GFR calc non Af Amer 84 (*) >90 (mL/min)    GFR calc Af Amer >90  >90 (mL/min)   CBC     Status: Abnormal   Collection Time   04/07/11  8:28 PM      Component Value Range Comment   WBC 11.8 (*) 4.0 - 10.5 (K/uL)    RBC 4.89  3.87 - 5.11 (MIL/uL)    Hemoglobin 14.8  12.0 - 15.0 (g/dL)    HCT 06.3  01.6 - 01.0 (%)    MCV 87.9  78.0 - 100.0 (fL)    MCH 30.3  26.0 - 34.0 (pg)    MCHC 34.4  30.0 - 36.0 (g/dL)    RDW 93.2  35.5 - 73.2 (%)    Platelets 240  150 - 400 (K/uL)   DIFFERENTIAL     Status: Abnormal   Collection Time   04/07/11  8:28 PM      Component Value Range Comment   Neutrophils Relative 94 (*) 43 - 77 (%)    Neutro Abs 11.1 (*) 1.7 - 7.7 (K/uL)    Lymphocytes Relative 4 (*) 12 - 46 (%)    Lymphs Abs 0.4 (*) 0.7 - 4.0 (K/uL)    Monocytes Relative 2 (*) 3 - 12 (%)    Monocytes Absolute 0.2  0.1 - 1.0 (K/uL)    Eosinophils Relative 0  0 - 5 (%)    Eosinophils Absolute 0.0  0.0 - 0.7 (K/uL)    Basophils Relative 0  0 - 1 (%)    Basophils Absolute 0.0  0.0 - 0.1 (K/uL)   HEPATIC FUNCTION PANEL     Status: Abnormal   Collection Time   04/07/11  8:28 PM      Component Value Range Comment   Total Protein 6.8  6.0 - 8.3 (g/dL)    Albumin 3.9  3.5 - 5.2 (g/dL)    AST 22  0 - 37 (U/L)    ALT 14  0 - 35 (U/L)    Alkaline Phosphatase 120 (*) 39 - 117 (U/L)    Total  Bilirubin 1.0  0.3 - 1.2 (mg/dL)    Bilirubin, Direct 0.2  0.0 - 0.3 (mg/dL)    Indirect Bilirubin 0.8  0.3 - 0.9 (mg/dL)   LIPASE, BLOOD     Status: Normal   Collection Time   04/07/11  8:28 PM      Component Value Range Comment   Lipase 47  11 - 59 (U/L)   PROTIME-INR     Status: Abnormal   Collection Time   04/07/11  8:28 PM      Component Value Range Comment   Prothrombin Time 23.2 (*) 11.6 - 15.2 (seconds)    INR 2.02 (*) 0.00 - 1.49    LACTIC ACID, PLASMA     Status: Abnormal   Collection Time   04/07/11  8:40 PM      Component Value Range Comment   Lactic Acid, Venous 2.3 (*) 0.5 - 2.2 (mmol/L)   POCT I-STAT TROPONIN I     Status: Normal   Collection Time   04/07/11  8:51 PM      Component Value Range Comment   Troponin i, poc 0.00  0.00 - 0.08 (ng/mL)    Comment 3  OCCULT BLOOD, POC DEVICE     Status: Normal   Collection Time   04/07/11  9:58 PM      Component Value Range Comment   Fecal Occult Bld NEGATIVE     URINALYSIS, ROUTINE W REFLEX MICROSCOPIC     Status: Abnormal   Collection Time   04/07/11 10:16 PM      Component Value Range Comment   Color, Urine YELLOW  YELLOW     APPearance CLOUDY (*) CLEAR     Specific Gravity, Urine 1.029  1.005 - 1.030     pH 5.5  5.0 - 8.0     Glucose, UA NEGATIVE  NEGATIVE (mg/dL)    Hgb urine dipstick NEGATIVE  NEGATIVE     Bilirubin Urine SMALL (*) NEGATIVE     Ketones, ur 40 (*) NEGATIVE (mg/dL)    Protein, ur NEGATIVE  NEGATIVE (mg/dL)    Urobilinogen, UA 0.2  0.0 - 1.0 (mg/dL)    Nitrite NEGATIVE  NEGATIVE     Leukocytes, UA TRACE (*) NEGATIVE    URINE MICROSCOPIC-ADD ON     Status: Normal   Collection Time   04/07/11 10:16 PM      Component Value Range Comment   Squamous Epithelial / LPF RARE  RARE     WBC, UA 3-6  <3 (WBC/hpf)    RBC / HPF 0-2  <3 (RBC/hpf)    Bacteria, UA RARE  RARE     Urine-Other MUCOUS PRESENT      Ct Abdomen Pelvis W Contrast  04/08/2011  *RADIOLOGY REPORT*  Clinical Data: Vomiting.  CT  ABDOMEN AND PELVIS WITH CONTRAST  Technique:  Multidetector CT imaging of the abdomen and pelvis was performed following the standard protocol during bolus administration of intravenous contrast.  Contrast: OMNIPAQUE IOHEXOL 300 MG/ML IJ SOLN  Comparison: Acute abdominal series 04/07/2011.  Findings: The lung bases are clear except for minimal dependent atelectasis.  A small hiatal hernia is noted.  The liver is unremarkable except for a small calcified granulomas. No worrisome hepatic lesions or intrahepatic biliary dilatation of the gallbladder is normal.  No common bile duct dilatation.  The pancreas is unremarkable.  The spleen demonstrates multiple calcified granulomas.  The adrenal glands and kidneys are unremarkable and stable.  Stable small parapelvic and parenchymal cysts.  The stomach is not well distended but no gross abnormalities are seen.  The duodenum, small bowel and colon demonstrate no significant abnormalities.  No inflammatory changes or mass lesions.  The appendix is not visualized but no findings to suggest acute appendicitis.  Low lying cecum deep in the pelvis and moderate artifact related to bilateral hip prosthesis.  The aorta is normal in caliber.  Moderate to advanced atherosclerotic calcifications.  The major branch vessels are patent.  No mesenteric or retroperitoneal masses or adenopathy. Small scattered lymph nodes are noted.  The uterus and ovaries are grossly normal.  The bladder is grossly normal.  The bony pelvis is intact. Probable healed or healing left sacral stress fractures.  No inguinal mass or hernia.  IMPRESSION:  1.  No acute abdominal/pelvic findings, mass lesions or lymphadenopathy. 2.  Calcified hepatic and splenic granuloma. 3.  Hiatal hernia. 4.  Stable bilateral parapelvic and parenchymal renal cysts.  The five healed or healing left sacral stress fracture.  Original Report Authenticated By: P. Loralie Champagne, M.D.   Dg Abd Acute W/chest  04/07/2011   *RADIOLOGY REPORT*  Clinical Data: Sickle last week but today started having bilateral abdominal  pain, shortness of breath, nausea, vomiting, diarrhea, and headache.  ACUTE ABDOMEN SERIES (ABDOMEN 2 VIEW & CHEST 1 VIEW)  Comparison: Chest 03/06/2010  Findings: Normal heart size and pulmonary vascularity. Emphysematous changes and scattered fibrosis in the lungs.  No focal airspace consolidation.  No blunting of costophrenic angles. No pneumothorax.  Calcified and tortuous aorta.  Paucity of gas in the bowel.  No evidence of gaseous distension of small or large bowel.  Multiple small gas bubbles are demonstrated within bowel on the upright view.  Dilated fluid filled bowel is not excluded.  No free intra-abdominal air.  No radiopaque stones. Vascular calcifications.  Postoperative changes in both hips. Calcified pelvic phleboliths.  Thoracolumbar scoliosis with diffuse degenerative change.  IMPRESSION: No evidence of active pulmonary disease.  Nonspecific bowel gas pattern.  No specific evidence of bowel obstruction although fluid filled dilated bowel is not excluded.  Original Report Authenticated By: Marlon Pel, M.D.    ROS  Blood pressure 126/56, pulse 58, temperature 98 F (36.7 C), temperature source Oral, resp. rate 19, SpO2 96.00%. Physical Exam   Assessment/Plan 1) Probable norovirus- Treatment is supportive.  Will continue her usual home meds but with IV potassium and fluid replacement and antiemetics.  WIll be on isolation per protocol.  WIll order c. Dif and enteric pathogen screen as well. 2) Hypokalemia- Replaced by IV in ER, IVF ordered with 40 meq kcl/hour 3) Afib-  Coumadin per pharmacy, INR therapeutic currently. 4) Gastroparesis.  GI may wish to see patient as inpatient but will likely have little to offer in light of infection superimposed on regular.  Other option is overuse of OTC's worsening symptoms. 5) HTN  Controlled 6) Hyperlipidemia- Has been seen for recent APE, no  adjustments to regular home meds. 7) Anxiety- May require addition of anxiolytic due to hospitalization and recent issues.   Analyn Matusek W 04/08/2011, 1:46 AM

## 2011-04-08 NOTE — Progress Notes (Signed)
1622- Patient reports feeling "short of breath." RN to bedside.  Patient's HR is 48, BP 128/58, RR 18, O2 sats 99% on 2L.  Patient's lung sounds clear throughout, but patient reports that she continues to feel somewhat short of breath.  Will continue to monitor. Morley Kos 04/08/2011

## 2011-04-08 NOTE — ED Notes (Signed)
Patient transported to CT 

## 2011-04-09 DIAGNOSIS — K3184 Gastroparesis: Secondary | ICD-10-CM | POA: Diagnosis present

## 2011-04-09 DIAGNOSIS — R197 Diarrhea, unspecified: Secondary | ICD-10-CM | POA: Diagnosis present

## 2011-04-09 DIAGNOSIS — Z7901 Long term (current) use of anticoagulants: Secondary | ICD-10-CM

## 2011-04-09 DIAGNOSIS — E876 Hypokalemia: Secondary | ICD-10-CM | POA: Diagnosis present

## 2011-04-09 DIAGNOSIS — R112 Nausea with vomiting, unspecified: Secondary | ICD-10-CM | POA: Diagnosis present

## 2011-04-09 LAB — PROTIME-INR
INR: 1.97 — ABNORMAL HIGH (ref 0.00–1.49)
Prothrombin Time: 22.8 seconds — ABNORMAL HIGH (ref 11.6–15.2)

## 2011-04-09 LAB — COMPREHENSIVE METABOLIC PANEL
ALT: 14 U/L (ref 0–35)
AST: 26 U/L (ref 0–37)
Albumin: 2.8 g/dL — ABNORMAL LOW (ref 3.5–5.2)
Alkaline Phosphatase: 85 U/L (ref 39–117)
BUN: 10 mg/dL (ref 6–23)
CO2: 25 mEq/L (ref 19–32)
Calcium: 9 mg/dL (ref 8.4–10.5)
Chloride: 112 mEq/L (ref 96–112)
Creatinine, Ser: 0.66 mg/dL (ref 0.50–1.10)
GFR calc Af Amer: >90 mL/min (ref >90)
GFR calc non Af Amer: 82 mL/min — ABNORMAL LOW (ref >90)
Glucose, Bld: 86 mg/dL (ref 70–99)
Potassium: 4.5 mEq/L (ref 3.5–5.1)
Sodium: 143 mEq/L (ref 135–145)
Total Bilirubin: 0.4 mg/dL (ref 0.3–1.2)
Total Protein: 5 g/dL — ABNORMAL LOW (ref 6.0–8.3)

## 2011-04-09 LAB — CBC
HCT: 35.9 % — ABNORMAL LOW (ref 36.0–46.0)
Hemoglobin: 11.7 g/dL — ABNORMAL LOW (ref 12.0–15.0)
MCH: 29.5 pg (ref 26.0–34.0)
MCHC: 32.6 g/dL (ref 30.0–36.0)
MCV: 90.4 fL (ref 78.0–100.0)
Platelets: 165 10*3/uL (ref 150–400)
RBC: 3.97 MIL/uL (ref 3.87–5.11)
RDW: 14.4 % (ref 11.5–15.5)
WBC: 4.2 10*3/uL (ref 4.0–10.5)

## 2011-04-09 MED ORDER — PANTOPRAZOLE SODIUM 40 MG PO TBEC
40.0000 mg | DELAYED_RELEASE_TABLET | Freq: Two times a day (BID) | ORAL | Status: DC
  Administered 2011-04-09: 40 mg via ORAL
  Filled 2011-04-09: qty 1

## 2011-04-09 MED ORDER — FLECAINIDE ACETATE 50 MG PO TABS: 50.0000 mg | ORAL_TABLET | Freq: Two times a day (BID) | ORAL | Status: DC

## 2011-04-09 MED ORDER — WARFARIN SODIUM 6 MG PO TABS
6.0000 mg | ORAL_TABLET | Freq: Once | ORAL | Status: DC
  Filled 2011-04-09: qty 1

## 2011-04-09 NOTE — Progress Notes (Signed)
ANTICOAGULATION CONSULT NOTE - Follow Up Consult  Pharmacy Consult for coumadin Indication: atrial fibrillation  Allergies  Allergen Reactions  . Amoxicillin Other (See Comments)    unknown  . Cephalexin Other (See Comments)    unknown  . Codeine Other (See Comments)    unknown  . Reglan Other (See Comments)    unknown    Patient Measurements: Height: 5\' 6"  (167.6 cm) Weight: 151 lb 14.4 oz (68.9 kg) IBW/kg (Calculated) : 59.3   Vital Signs: Temp: 97 F (36.1 C) (04/02 0539) Temp src: Oral (04/02 0539) BP: 148/59 mmHg (04/02 0539) Pulse Rate: 51  (04/02 0539)  Labs:  Alvira Philips 04/09/11 0629 04/07/11 2028  HGB 11.7* 14.8  HCT 35.9* 43.0  PLT 165 240  APTT -- --  LABPROT 22.8* 23.2*  INR 1.97* 2.02*  HEPARINUNFRC -- --  CREATININE 0.66 0.63  CKTOTAL -- --  CKMB -- --  TROPONINI -- --   Estimated Creatinine Clearance: 54.3 ml/min (by C-G formula based on Cr of 0.66).   Medications:  Scheduled:    . calcium carbonate  1 tablet Oral BID WC  . erythromycin  250 mg Oral TID WC & HS  . flecainide  50 mg Oral BID  . pantoprazole (PROTONIX) IV  40 mg Intravenous Q12H  . spironolactone  50 mg Oral Once  . warfarin  4 mg Oral q1800  . Warfarin - Pharmacist Dosing Inpatient   Does not apply q1800  . DISCONTD: calcium carbonate  600 mg Oral BID WC  . DISCONTD: enoxaparin  40 mg Subcutaneous Q24H  . DISCONTD: iohexol  20 mL Oral Q1 Hr x 2    Assessment: 78 yo female admitted for probable norovirus on coumadin 4mg  daily for afib. INR was at goal on admit and is just very slighlty below goal today (1.97).  There may be increased sensitivity in the setting of N/V/D but also some concern for complete absorption.  Goal of Therapy:  INR 2-3   Plan:  -Coumadin 6mg  today and will watch INR trend closely  Benny Lennert 04/09/2011,8:32 AM

## 2011-04-09 NOTE — Discharge Summary (Signed)
Physician Discharge Summary  Patient ID: Gloria [name] MRN: [id number] DOB/AGE: 76-May-1932 76 y.o.  Admit date: 04/07/2011 Discharge date: 04/09/2011   Discharge Diagnoses:  Principal Problem:  *Nausea & vomiting Active Problems:  Diarrhea  Atrial fibrillation  Hypokalemia  Gastroparesis  Hypertension  Warfarin anticoagulation   Discharged Condition: good  Hospital Course:   The patient is a 78 year old woman who presented to the emergency room after having acute onset of severe nausea, vomiting, and diarrhea on the day of admission. Adult episodes of green-colored vomiting with multiple loose brown bowel movements. She has not had fever or chills and has not had recent antibiotics. Her emergency room workup was significant for hypokalemia and she was started on IV fluids. She was also treated with antibiotics, and her diet was slowly advanced her regular diet which he tolerated well. On the day of discharge she no longer had any nausea, vomiting, or diarrhea and 8 a normal breakfast without difficulty. She denied shortness of breath or chest pain or palpitations. Consults: None  Significant Diagnostic Studies:  Ct Abdomen Pelvis W Contrast  04/08/2011  *RADIOLOGY REPORT*  Clinical Data: Vomiting.  CT ABDOMEN AND PELVIS WITH CONTRAST  Technique:  Multidetector CT imaging of the abdomen and pelvis was performed following the standard protocol during bolus administration of intravenous contrast.  Contrast: OMNIPAQUE IOHEXOL 300 MG/ML IJ SOLN  Comparison: Acute abdominal series 04/07/2011.  Findings: The lung bases are clear except for minimal dependent atelectasis.  A small hiatal hernia is noted.  The liver is unremarkable except for a small calcified granulomas. No worrisome hepatic lesions or intrahepatic biliary dilatation of the gallbladder is normal.  No common bile duct dilatation.  The pancreas is unremarkable.  The spleen demonstrates multiple calcified granulomas.  The adrenal  glands and kidneys are unremarkable and stable.  Stable small parapelvic and parenchymal cysts.  The stomach is not well distended but no gross abnormalities are seen.  The duodenum, small bowel and colon demonstrate no significant abnormalities.  No inflammatory changes or mass lesions.  The appendix is not visualized but no findings to suggest acute appendicitis.  Low lying cecum deep in the pelvis and moderate artifact related to bilateral hip prosthesis.  The aorta is normal in caliber.  Moderate to advanced atherosclerotic calcifications.  The major branch vessels are patent.  No mesenteric or retroperitoneal masses or adenopathy. Small scattered lymph nodes are noted.  The uterus and ovaries are grossly normal.  The bladder is grossly normal.  The bony pelvis is intact. Probable healed or healing left sacral stress fractures.  No inguinal mass or hernia.  IMPRESSION:  1.  No acute abdominal/pelvic findings, mass lesions or lymphadenopathy. 2.  Calcified hepatic and splenic granuloma. 3.  Hiatal hernia. 4.  Stable bilateral parapelvic and parenchymal renal cysts.  The five healed or healing left sacral stress fracture.  Original Report Authenticated By: P. Loralie Champagne, M.D.   Dg Abd Acute W/chest  04/07/2011  *RADIOLOGY REPORT*  Clinical Data: Sickle last week but today started having bilateral abdominal pain, shortness of breath, nausea, vomiting, diarrhea, and headache.  ACUTE ABDOMEN SERIES (ABDOMEN 2 VIEW & CHEST 1 VIEW)  Comparison: Chest 03/06/2010  Findings: Normal heart size and pulmonary vascularity. Emphysematous changes and scattered fibrosis in the lungs.  No focal airspace consolidation.  No blunting of costophrenic angles. No pneumothorax.  Calcified and tortuous aorta.  Paucity of gas in the bowel.  No evidence of gaseous distension of small or large bowel.  Multiple small gas bubbles are demonstrated within bowel on the upright view.  Dilated fluid filled bowel is not excluded.  No free  intra-abdominal air.  No radiopaque stones. Vascular calcifications.  Postoperative changes in both hips. Calcified pelvic phleboliths.  Thoracolumbar scoliosis with diffuse degenerative change.  IMPRESSION: No evidence of active pulmonary disease.  Nonspecific bowel gas pattern.  No specific evidence of bowel obstruction although fluid filled dilated bowel is not excluded.  Original Report Authenticated By: Marlon Pel, M.D.    Labs: Lab Results  Component Value Date   WBC 4.2 04/09/2011   HGB 11.7* 04/09/2011   HCT 35.9* 04/09/2011   MCV 90.4 04/09/2011   PLT 165 04/09/2011     Lab 04/09/11 0629  NA 143  K 4.5  CL 112  CO2 25  BUN 10  CREATININE 0.66  CALCIUM 9.0  PROT 5.0*  BILITOT 0.4  ALKPHOS 85  ALT 14  AST 26  GLUCOSE 86       Lab Results  Component Value Date   INR 1.97* 04/09/2011   INR 2.02* 04/07/2011   INR 2.4 03/12/2011     No results found for this or any previous visit (from the past 240 hour(s)).    Discharge Exam: Blood pressure 148/59, pulse 51, temperature 97 F (36.1 C), temperature source Oral, resp. rate 20, height 5\' 6"  (1.676 m), weight 68.9 kg (151 lb 14.4 oz), SpO2 94.00%.  Physical Exam: In general, she is a well-nourished well-developed Caucasian woman who was in no apparent distress was sitting upright in bed. HEENT exam was within normal limits, neck was supple without jugular venous distention or carotid bruit, chest was clear to auscultation, heart had a regular rate and rhythm without significant murmur or gallop, abdomen had normal bowel sounds and no hepatosplenomegaly or tenderness, extremities were without cyanosis, clubbing, or edema. Neurologic exam was nonfocal. Labs indicated normalization of her hypokalemia.  Disposition: She'll be discharged to home today and was advised to call our office should she develop recurrent nausea, vomiting, or diarrhea. She should resume her normal medications. She should call to her schedule a followup  visit in approximately 7 days from discharge.  Discharge Orders    Future Appointments: Provider: Department: Dept Phone: Center:   04/10/2011 9:45 AM [facility] [phone] None   06/13/2011 9:45 AM Peter M [place], MD [facility] [phone] None     Future Orders Please Complete By Expires   Diet - low sodium heart healthy      Increase activity slowly      Discharge instructions      Comments:   You can resume a normal diet. Please call if your symptoms recur with nausea, vomiting, diarrhea, fever, chills, or shortness of breath. Call to schedule an appointment with Dr. Jarome Matin in about one week from discharge.     Medication List  As of 04/09/2011 11:17 AM   TAKE these medications         calcium carbonate 600 MG Tabs   Commonly known as: OS-CAL   Take 600 mg by mouth 2 (two) times daily with a meal.      erythromycin 250 MG tablet   Commonly known as: E-MYCIN   Take 1 tablet by mouth Twice daily.      flecainide 50 MG tablet   Commonly known as: TAMBOCOR   Take 1 tablet (50 mg total) by mouth 2 (two) times daily.      hydrochlorothiazide 25  MG tablet   Commonly known as: HYDRODIURIL   Take 25 mg by mouth daily.      omeprazole 40 MG capsule   Commonly known as: PRILOSEC   Take 40 mg by mouth 2 (two) times daily.      warfarin 4 MG tablet   Commonly known as: COUMADIN   Take 4 mg by mouth daily. Take 1.5 tabs or 6 mg on Wednesdays           Follow-up Information    Follow up with Garlan Fillers, MD. Schedule an appointment as soon as possible for a visit in 7 days.   Contact information:   [phone]         Signed: Garlan Fillers 04/09/2011, 11:17 AM

## 2011-04-10 ENCOUNTER — Other Ambulatory Visit: Payer: Self-pay | Admitting: Internal Medicine

## 2011-04-10 DIAGNOSIS — Z1231 Encounter for screening mammogram for malignant neoplasm of breast: Secondary | ICD-10-CM

## 2011-04-15 NOTE — ED Provider Notes (Signed)
Medical screening examination/treatment/procedure(s) were performed by non-physician practitioner and as supervising physician I was immediately available for consultation/collaboration.  Vala Raffo, MD 04/15/11 1457 

## 2011-04-17 ENCOUNTER — Ambulatory Visit (INDEPENDENT_AMBULATORY_CARE_PROVIDER_SITE_OTHER): Payer: Medicare Other | Admitting: *Deleted

## 2011-04-17 DIAGNOSIS — Z7901 Long term (current) use of anticoagulants: Secondary | ICD-10-CM

## 2011-04-17 DIAGNOSIS — I4891 Unspecified atrial fibrillation: Secondary | ICD-10-CM

## 2011-04-17 LAB — POCT INR: INR: 1.8

## 2011-04-29 ENCOUNTER — Other Ambulatory Visit: Payer: Self-pay | Admitting: Cardiology

## 2011-04-29 MED ORDER — FLECAINIDE ACETATE 50 MG PO TABS: 50.0000 mg | ORAL_TABLET | Freq: Two times a day (BID) | ORAL | Status: DC

## 2011-04-30 ENCOUNTER — Other Ambulatory Visit: Payer: Self-pay | Admitting: *Deleted

## 2011-05-09 DIAGNOSIS — I10 Essential (primary) hypertension: Secondary | ICD-10-CM | POA: Diagnosis not present

## 2011-05-09 DIAGNOSIS — K3184 Gastroparesis: Secondary | ICD-10-CM | POA: Diagnosis not present

## 2011-05-09 DIAGNOSIS — K219 Gastro-esophageal reflux disease without esophagitis: Secondary | ICD-10-CM | POA: Diagnosis not present

## 2011-05-09 DIAGNOSIS — A088 Other specified intestinal infections: Secondary | ICD-10-CM | POA: Diagnosis not present

## 2011-05-15 ENCOUNTER — Ambulatory Visit
Admission: RE | Admit: 2011-05-15 | Discharge: 2011-05-15 | Disposition: A | Discharge status: Home / Self Care | Payer: Medicare Other | Source: Ambulatory Visit | Attending: Internal Medicine | Admitting: Internal Medicine

## 2011-05-15 ENCOUNTER — Ambulatory Visit (INDEPENDENT_AMBULATORY_CARE_PROVIDER_SITE_OTHER): Payer: Medicare Other | Admitting: *Deleted

## 2011-05-15 DIAGNOSIS — I4891 Unspecified atrial fibrillation: Secondary | ICD-10-CM

## 2011-05-15 DIAGNOSIS — Z7901 Long term (current) use of anticoagulants: Secondary | ICD-10-CM | POA: Diagnosis not present

## 2011-05-15 DIAGNOSIS — Z1231 Encounter for screening mammogram for malignant neoplasm of breast: Secondary | ICD-10-CM | POA: Diagnosis not present

## 2011-05-15 LAB — POCT INR: INR: 2.1

## 2011-05-23 ENCOUNTER — Telehealth: Payer: Self-pay | Admitting: Cardiology

## 2011-05-23 NOTE — Telephone Encounter (Signed)
Patient called stated she will getting back injections 06/05/11 and wants to know if she needs to hold coumadin.Patient was told Dr.Jordan out of office will call her back next week.

## 2011-05-23 NOTE — Telephone Encounter (Signed)
Please return call to patient at [phone] regarding upcoming back injection and coumadin hold.

## 2011-05-30 NOTE — Telephone Encounter (Signed)
Patient called was told okay with Dr.Jordan to hold coumadin 5 days prior to back injections.

## 2011-06-05 DIAGNOSIS — M545 Low back pain, unspecified: Secondary | ICD-10-CM | POA: Diagnosis not present

## 2011-06-05 DIAGNOSIS — Z7901 Long term (current) use of anticoagulants: Secondary | ICD-10-CM | POA: Diagnosis not present

## 2011-06-13 ENCOUNTER — Ambulatory Visit (INDEPENDENT_AMBULATORY_CARE_PROVIDER_SITE_OTHER): Payer: Medicare Other | Admitting: *Deleted

## 2011-06-13 ENCOUNTER — Encounter: Payer: Self-pay | Admitting: Cardiology

## 2011-06-13 ENCOUNTER — Ambulatory Visit (INDEPENDENT_AMBULATORY_CARE_PROVIDER_SITE_OTHER): Payer: Medicare Other | Admitting: Cardiology

## 2011-06-13 VITALS — BP 140/72 | HR 55 | Ht 66.0 in | Wt 149.0 lb

## 2011-06-13 DIAGNOSIS — I10 Essential (primary) hypertension: Secondary | ICD-10-CM | POA: Diagnosis not present

## 2011-06-13 DIAGNOSIS — Z7901 Long term (current) use of anticoagulants: Secondary | ICD-10-CM

## 2011-06-13 DIAGNOSIS — I4891 Unspecified atrial fibrillation: Secondary | ICD-10-CM

## 2011-06-13 LAB — POCT INR: INR: 1.5

## 2011-06-13 NOTE — Assessment & Plan Note (Signed)
Her arrhythmia is well controlled on flecainide. We will continue her current therapy and followup again in 6 months.

## 2011-06-13 NOTE — Assessment & Plan Note (Signed)
Appears to be under good control. We will continue her current therapy.

## 2011-06-13 NOTE — Patient Instructions (Signed)
Continue your current therapy  I will see you again in 6 months.   

## 2011-06-13 NOTE — Progress Notes (Signed)
   Gloria [name] Date of Birth: 06/30/1930   History of Present Illness: Gloria [name] seen today for followup. She reports she has done well from a cardiac standpoint. She's had one episode where her blood pressure went up but otherwise it has been under good control. She has been under increased stress with her husband battling lymphoma. She fell and fractured her coccyx in January. She was ill with norovirus in April. Recently she was given a back injection in her Coumadin was held.  Current Outpatient Prescriptions on File Prior to Visit  Medication Sig Dispense Refill  . calcium carbonate (OS-CAL) 600 MG TABS Take 600 mg by mouth 2 (two) times daily with a meal.        . erythromycin (E-MYCIN) 250 MG tablet Take 1 tablet by mouth Twice daily.      . flecainide (TAMBOCOR) 50 MG tablet Take 1 tablet (50 mg total) by mouth 2 (two) times daily.  180 tablet  1  . hydrochlorothiazide (HYDRODIURIL) 25 MG tablet Take 25 mg by mouth daily.      Marland Kitchen omeprazole (PRILOSEC) 40 MG capsule Take 40 mg by mouth 2 (two) times daily.        Marland Kitchen warfarin (COUMADIN) 4 MG tablet Take 4 mg by mouth daily. Take 1.5 tabs or 6 mg on Wednesdays        Allergies  Allergen Reactions  . Amoxicillin Other (See Comments)    unknown  . Cephalexin Other (See Comments)    unknown  . Codeine Other (See Comments)    unknown  . Metoclopramide Hcl Other (See Comments)    unknown    Past Medical History  Diagnosis Date  . Hypertension   . Hyperlipidemia   . GERD (gastroesophageal reflux disease)     hx of  . Arthritis     right  . Atrial fibrillation   . Irritable bowel syndrome   . Meningioma   . Hepatitis C     Past Surgical History  Procedure Date  . Total hip arthroplasty 11/07    left  . Parotid gland tumor excision   . Total knee arthroplasty   . Transthoracic echocardiogram 10/25/2009    EF 55-60%  . Cardiovascular stress test 11/14/2006    EF 67%    History  Smoking status  . Former Smoker  -- 0.3 packs/day for 15 years  . Types: Cigarettes  . Quit date: 06/07/1980  Smokeless tobacco  . Never Used    History  Alcohol Use No    Family History  Problem Relation Age of Onset  . Heart disease Brother     Review of Systems: As noted in history of present illness.  All other systems were reviewed and are negative.  Physical Exam: BP 140/72  Pulse 55  Ht 5\' 6"  (1.676 m)  Wt 149 lb (67.586 kg)  BMI 24.05 kg/m2 She is a pleasant white female in no acute distress. Her HEENT exam is unremarkable. She has no JVD or bruits. Lungs are clear. Cardiac exam reveals a regular rate and rhythm with a soft 1/6 systolic ejection murmur at right upper sternal border. Abdomen is soft and nontender without masses. She has no edema. Pedal pulses are good. LABORATORY DATA: Last INR was 1.5 today  Assessment / Plan:

## 2011-06-13 NOTE — Assessment & Plan Note (Signed)
INR is low today but Coumadin was recently held for a procedure. We will adjust and follow in our Coumadin clinic.

## 2011-06-24 DIAGNOSIS — H04129 Dry eye syndrome of unspecified lacrimal gland: Secondary | ICD-10-CM | POA: Diagnosis not present

## 2011-06-24 DIAGNOSIS — D320 Benign neoplasm of cerebral meninges: Secondary | ICD-10-CM | POA: Diagnosis not present

## 2011-06-24 DIAGNOSIS — H01009 Unspecified blepharitis unspecified eye, unspecified eyelid: Secondary | ICD-10-CM | POA: Diagnosis not present

## 2011-06-24 DIAGNOSIS — H18599 Other hereditary corneal dystrophies, unspecified eye: Secondary | ICD-10-CM | POA: Diagnosis not present

## 2011-06-25 ENCOUNTER — Ambulatory Visit (INDEPENDENT_AMBULATORY_CARE_PROVIDER_SITE_OTHER): Payer: Medicare Other

## 2011-06-25 DIAGNOSIS — Z7901 Long term (current) use of anticoagulants: Secondary | ICD-10-CM | POA: Diagnosis not present

## 2011-06-25 DIAGNOSIS — I4891 Unspecified atrial fibrillation: Secondary | ICD-10-CM

## 2011-06-25 LAB — POCT INR: INR: 2.1

## 2011-07-17 DIAGNOSIS — M779 Enthesopathy, unspecified: Secondary | ICD-10-CM | POA: Diagnosis not present

## 2011-07-23 DIAGNOSIS — M779 Enthesopathy, unspecified: Secondary | ICD-10-CM | POA: Diagnosis not present

## 2011-07-24 ENCOUNTER — Ambulatory Visit (INDEPENDENT_AMBULATORY_CARE_PROVIDER_SITE_OTHER): Payer: Medicare Other | Admitting: *Deleted

## 2011-07-24 DIAGNOSIS — I4891 Unspecified atrial fibrillation: Secondary | ICD-10-CM

## 2011-07-24 DIAGNOSIS — Z7901 Long term (current) use of anticoagulants: Secondary | ICD-10-CM

## 2011-07-24 LAB — POCT INR: INR: 1.8

## 2011-07-31 DIAGNOSIS — M779 Enthesopathy, unspecified: Secondary | ICD-10-CM | POA: Diagnosis not present

## 2011-08-02 DIAGNOSIS — M779 Enthesopathy, unspecified: Secondary | ICD-10-CM | POA: Diagnosis not present

## 2011-08-07 ENCOUNTER — Ambulatory Visit (INDEPENDENT_AMBULATORY_CARE_PROVIDER_SITE_OTHER): Payer: Medicare Other | Admitting: *Deleted

## 2011-08-07 DIAGNOSIS — Z7901 Long term (current) use of anticoagulants: Secondary | ICD-10-CM | POA: Diagnosis not present

## 2011-08-07 DIAGNOSIS — I4891 Unspecified atrial fibrillation: Secondary | ICD-10-CM

## 2011-08-07 LAB — POCT INR: INR: 3.3

## 2011-08-23 ENCOUNTER — Ambulatory Visit (INDEPENDENT_AMBULATORY_CARE_PROVIDER_SITE_OTHER): Payer: Medicare Other

## 2011-08-23 DIAGNOSIS — I4891 Unspecified atrial fibrillation: Secondary | ICD-10-CM

## 2011-08-23 DIAGNOSIS — Z7901 Long term (current) use of anticoagulants: Secondary | ICD-10-CM

## 2011-08-23 LAB — POCT INR: INR: 2.5

## 2011-09-12 ENCOUNTER — Encounter: Payer: Self-pay | Admitting: Cardiology

## 2011-09-13 ENCOUNTER — Ambulatory Visit (INDEPENDENT_AMBULATORY_CARE_PROVIDER_SITE_OTHER): Payer: Medicare Other

## 2011-09-13 DIAGNOSIS — I4891 Unspecified atrial fibrillation: Secondary | ICD-10-CM

## 2011-09-13 DIAGNOSIS — Z7901 Long term (current) use of anticoagulants: Secondary | ICD-10-CM

## 2011-09-13 LAB — POCT INR: INR: 2.7

## 2011-10-11 ENCOUNTER — Ambulatory Visit (INDEPENDENT_AMBULATORY_CARE_PROVIDER_SITE_OTHER): Payer: Medicare Other | Admitting: Pharmacist

## 2011-10-11 DIAGNOSIS — Z7901 Long term (current) use of anticoagulants: Secondary | ICD-10-CM | POA: Diagnosis not present

## 2011-10-11 DIAGNOSIS — I4891 Unspecified atrial fibrillation: Secondary | ICD-10-CM

## 2011-10-11 LAB — POCT INR: INR: 1.9

## 2011-10-18 DIAGNOSIS — I4891 Unspecified atrial fibrillation: Secondary | ICD-10-CM | POA: Diagnosis not present

## 2011-10-18 DIAGNOSIS — B009 Herpesviral infection, unspecified: Secondary | ICD-10-CM | POA: Diagnosis not present

## 2011-11-05 DIAGNOSIS — Z23 Encounter for immunization: Secondary | ICD-10-CM | POA: Diagnosis not present

## 2011-11-08 ENCOUNTER — Ambulatory Visit (INDEPENDENT_AMBULATORY_CARE_PROVIDER_SITE_OTHER): Payer: Medicare Other | Admitting: *Deleted

## 2011-11-08 DIAGNOSIS — I4891 Unspecified atrial fibrillation: Secondary | ICD-10-CM

## 2011-11-08 DIAGNOSIS — Z7901 Long term (current) use of anticoagulants: Secondary | ICD-10-CM | POA: Diagnosis not present

## 2011-11-08 LAB — POCT INR: INR: 1.8

## 2011-11-08 MED ORDER — WARFARIN SODIUM 4 MG PO TABS: 4.0000 mg | ORAL_TABLET | ORAL | Status: DC

## 2011-11-15 DIAGNOSIS — L723 Sebaceous cyst: Secondary | ICD-10-CM | POA: Diagnosis not present

## 2011-11-15 DIAGNOSIS — L739 Follicular disorder, unspecified: Secondary | ICD-10-CM | POA: Diagnosis not present

## 2011-11-15 DIAGNOSIS — L570 Actinic keratosis: Secondary | ICD-10-CM | POA: Diagnosis not present

## 2011-11-15 DIAGNOSIS — D239 Other benign neoplasm of skin, unspecified: Secondary | ICD-10-CM | POA: Diagnosis not present

## 2011-11-15 DIAGNOSIS — D1801 Hemangioma of skin and subcutaneous tissue: Secondary | ICD-10-CM | POA: Diagnosis not present

## 2011-11-15 DIAGNOSIS — L821 Other seborrheic keratosis: Secondary | ICD-10-CM | POA: Diagnosis not present

## 2011-11-22 ENCOUNTER — Ambulatory Visit (INDEPENDENT_AMBULATORY_CARE_PROVIDER_SITE_OTHER): Payer: Medicare Other | Admitting: *Deleted

## 2011-11-22 DIAGNOSIS — I4891 Unspecified atrial fibrillation: Secondary | ICD-10-CM | POA: Diagnosis not present

## 2011-11-22 DIAGNOSIS — Z7901 Long term (current) use of anticoagulants: Secondary | ICD-10-CM

## 2011-11-22 LAB — POCT INR: INR: 1.8

## 2011-12-10 ENCOUNTER — Ambulatory Visit (INDEPENDENT_AMBULATORY_CARE_PROVIDER_SITE_OTHER): Payer: Medicare Other | Admitting: *Deleted

## 2011-12-10 ENCOUNTER — Encounter: Payer: Self-pay | Admitting: Cardiology

## 2011-12-10 ENCOUNTER — Ambulatory Visit (INDEPENDENT_AMBULATORY_CARE_PROVIDER_SITE_OTHER): Payer: Medicare Other | Admitting: Cardiology

## 2011-12-10 VITALS — BP 140/72 | HR 53 | Ht 66.25 in | Wt 149.8 lb

## 2011-12-10 DIAGNOSIS — I10 Essential (primary) hypertension: Secondary | ICD-10-CM | POA: Diagnosis not present

## 2011-12-10 DIAGNOSIS — Z7901 Long term (current) use of anticoagulants: Secondary | ICD-10-CM

## 2011-12-10 DIAGNOSIS — E785 Hyperlipidemia, unspecified: Secondary | ICD-10-CM

## 2011-12-10 DIAGNOSIS — I4891 Unspecified atrial fibrillation: Secondary | ICD-10-CM

## 2011-12-10 LAB — POCT INR: INR: 2.3

## 2011-12-10 NOTE — Patient Instructions (Signed)
Continue your current medication.  I will see you again in 6 months.   

## 2011-12-10 NOTE — Progress Notes (Signed)
[name] Date of Birth: 1930-04-28   History of Present Illness: Mrs. Brick seen today for followup. She reports she has done well from a cardiac standpoint. She denies any symptoms of palpitations. She notices a little bit of shortness of breath every now and then but nothing consistently. She has been dealing with a lot of stress this year related to her husband's illnesses.  Current Outpatient Prescriptions on File Prior to Visit  Medication Sig Dispense Refill  . calcium carbonate (OS-CAL) 600 MG TABS Take 600 mg by mouth 2 (two) times daily with a meal.        . erythromycin (E-MYCIN) 250 MG tablet Take 1 tablet by mouth Twice daily.      . flecainide (TAMBOCOR) 50 MG tablet Take 1 tablet (50 mg total) by mouth 2 (two) times daily.  180 tablet  1  . hydrochlorothiazide (HYDRODIURIL) 25 MG tablet Take 25 mg by mouth daily.      Marland Kitchen omeprazole (PRILOSEC) 40 MG capsule Take 40 mg by mouth 2 (two) times daily.        . Vitamin D, Ergocalciferol, (DRISDOL) 50000 UNITS CAPS Take 50,000 Units by mouth every 7 (seven) days.      Marland Kitchen warfarin (COUMADIN) 4 MG tablet Take 1 tablet (4 mg total) by mouth as directed.  130 tablet  0    Allergies  Allergen Reactions  . Amoxicillin Other (See Comments)    unknown  . Cephalexin Other (See Comments)    unknown  . Codeine Other (See Comments)    unknown  . Metoclopramide Hcl Other (See Comments)    unknown    Past Medical History  Diagnosis Date  . Hypertension   . Hyperlipidemia   . GERD (gastroesophageal reflux disease)     hx of  . Arthritis     right  . Atrial fibrillation   . Irritable bowel syndrome   . Meningioma   . Hepatitis C     Past Surgical History  Procedure Date  . Total hip arthroplasty 11/07    left  . Parotid gland tumor excision   . Total knee arthroplasty   . Transthoracic echocardiogram 10/25/2009    EF 55-60%  . Cardiovascular stress test 11/14/2006    EF 67%    History  Smoking status  .  Former Smoker -- 0.3 packs/day for 15 years  . Types: Cigarettes  . Quit date: 06/07/1980  Smokeless tobacco  . Never Used    History  Alcohol Use No    Family History  Problem Relation Age of Onset  . Heart disease Brother     Review of Systems: As noted in history of present illness.  All other systems were reviewed and are negative.  Physical Exam: BP 140/72  Pulse 53  Ht 5' 6.25" (1.683 m)  Wt 149 lb 12.8 oz (67.949 kg)  BMI 24.00 kg/m2 She is a pleasant white female in no acute distress. Her HEENT exam is unremarkable. She has no JVD or bruits. Lungs are clear. Cardiac exam reveals a regular rate and rhythm with a soft 1/6 systolic ejection murmur at right upper sternal border. Abdomen is soft and nontender without masses. She has no edema. Pedal pulses are good. LABORATORY DATA: Last INR was 2.3 today. ECG demonstrates normal sinus rhythm with a rate of 59 beats per minute. There is LVH by voltage. Compared to ECG earlier in the year there are less T wave changes in the precordial leads.  Assessment / Plan: 1. Atrial fibrillation. This is well controlled on flecainide. We will continue her current therapy.  2. Chronic anticoagulation, therapeutic. Continue Coumadin.  3. Hypertension, well controlled.

## 2011-12-19 ENCOUNTER — Telehealth: Payer: Self-pay | Admitting: Cardiology

## 2011-12-19 ENCOUNTER — Other Ambulatory Visit: Payer: Self-pay

## 2011-12-19 MED ORDER — HYDROCHLOROTHIAZIDE 25 MG PO TABS: 25.0000 mg | ORAL_TABLET | Freq: Every day | ORAL | Status: DC

## 2011-12-19 NOTE — Telephone Encounter (Signed)
Called Ms. Gloria [name] and verified needed refill for hydrochlorothiazide (HYDRODIURIL) 25 MG tablet  pharmacy [facility] and explained to her I sent in Rx for 30 tablets with 11 refills and in the notes of the refill I put 90 day supply is acceptable with 3 refills so she could get whichever was cheaper.

## 2011-12-19 NOTE — Telephone Encounter (Signed)
New Problem:     Patient called in needing a refill of her hydrochlorothiazide (HYDRODIURIL) 25 MG tablet. 

## 2011-12-20 ENCOUNTER — Other Ambulatory Visit: Payer: Self-pay

## 2011-12-20 MED ORDER — HYDROCHLOROTHIAZIDE 25 MG PO TABS: 25.0000 mg | ORAL_TABLET | Freq: Every day | ORAL | Status: DC

## 2012-01-06 ENCOUNTER — Ambulatory Visit (INDEPENDENT_AMBULATORY_CARE_PROVIDER_SITE_OTHER): Payer: Medicare Other | Admitting: *Deleted

## 2012-01-06 DIAGNOSIS — Z7901 Long term (current) use of anticoagulants: Secondary | ICD-10-CM

## 2012-01-06 DIAGNOSIS — I4891 Unspecified atrial fibrillation: Secondary | ICD-10-CM

## 2012-01-06 LAB — POCT INR: INR: 2.1

## 2012-02-03 ENCOUNTER — Ambulatory Visit (INDEPENDENT_AMBULATORY_CARE_PROVIDER_SITE_OTHER): Payer: Medicare Other | Admitting: *Deleted

## 2012-02-03 DIAGNOSIS — Z7901 Long term (current) use of anticoagulants: Secondary | ICD-10-CM | POA: Diagnosis not present

## 2012-02-03 DIAGNOSIS — I4891 Unspecified atrial fibrillation: Secondary | ICD-10-CM | POA: Diagnosis not present

## 2012-02-03 LAB — POCT INR: INR: 1.9

## 2012-02-24 ENCOUNTER — Other Ambulatory Visit: Payer: Self-pay | Admitting: *Deleted

## 2012-02-24 MED ORDER — WARFARIN SODIUM 4 MG PO TABS: ORAL_TABLET | ORAL | Status: DC

## 2012-03-02 ENCOUNTER — Ambulatory Visit (INDEPENDENT_AMBULATORY_CARE_PROVIDER_SITE_OTHER): Payer: Medicare Other | Admitting: *Deleted

## 2012-03-02 DIAGNOSIS — Z7901 Long term (current) use of anticoagulants: Secondary | ICD-10-CM

## 2012-03-02 DIAGNOSIS — I4891 Unspecified atrial fibrillation: Secondary | ICD-10-CM

## 2012-03-02 LAB — POCT INR: INR: 1.8

## 2012-03-16 ENCOUNTER — Ambulatory Visit (INDEPENDENT_AMBULATORY_CARE_PROVIDER_SITE_OTHER): Payer: Medicare Other | Admitting: *Deleted

## 2012-03-16 DIAGNOSIS — Z7901 Long term (current) use of anticoagulants: Secondary | ICD-10-CM | POA: Diagnosis not present

## 2012-03-16 DIAGNOSIS — I4891 Unspecified atrial fibrillation: Secondary | ICD-10-CM

## 2012-03-16 LAB — POCT INR: INR: 2.5

## 2012-03-30 ENCOUNTER — Ambulatory Visit (INDEPENDENT_AMBULATORY_CARE_PROVIDER_SITE_OTHER): Payer: Medicare Other | Admitting: *Deleted

## 2012-03-30 DIAGNOSIS — E785 Hyperlipidemia, unspecified: Secondary | ICD-10-CM | POA: Diagnosis not present

## 2012-03-30 DIAGNOSIS — M949 Disorder of cartilage, unspecified: Secondary | ICD-10-CM | POA: Diagnosis not present

## 2012-03-30 DIAGNOSIS — I10 Essential (primary) hypertension: Secondary | ICD-10-CM | POA: Diagnosis not present

## 2012-03-30 DIAGNOSIS — I4891 Unspecified atrial fibrillation: Secondary | ICD-10-CM

## 2012-03-30 DIAGNOSIS — Z79899 Other long term (current) drug therapy: Secondary | ICD-10-CM | POA: Diagnosis not present

## 2012-03-30 DIAGNOSIS — M899 Disorder of bone, unspecified: Secondary | ICD-10-CM | POA: Diagnosis not present

## 2012-03-30 DIAGNOSIS — Z7901 Long term (current) use of anticoagulants: Secondary | ICD-10-CM

## 2012-03-30 LAB — POCT INR: INR: 2

## 2012-04-06 DIAGNOSIS — M899 Disorder of bone, unspecified: Secondary | ICD-10-CM | POA: Diagnosis not present

## 2012-04-06 DIAGNOSIS — E785 Hyperlipidemia, unspecified: Secondary | ICD-10-CM | POA: Diagnosis not present

## 2012-04-06 DIAGNOSIS — I4891 Unspecified atrial fibrillation: Secondary | ICD-10-CM | POA: Diagnosis not present

## 2012-04-06 DIAGNOSIS — I10 Essential (primary) hypertension: Secondary | ICD-10-CM | POA: Diagnosis not present

## 2012-04-06 DIAGNOSIS — Z0000 Encounter for general adult medical examination without abnormal findings: Secondary | ICD-10-CM | POA: Diagnosis not present

## 2012-04-06 DIAGNOSIS — Z79899 Other long term (current) drug therapy: Secondary | ICD-10-CM | POA: Diagnosis not present

## 2012-04-07 DIAGNOSIS — Z1212 Encounter for screening for malignant neoplasm of rectum: Secondary | ICD-10-CM | POA: Diagnosis not present

## 2012-04-14 ENCOUNTER — Other Ambulatory Visit: Payer: Self-pay

## 2012-04-14 DIAGNOSIS — Z1231 Encounter for screening mammogram for malignant neoplasm of breast: Secondary | ICD-10-CM

## 2012-04-17 DIAGNOSIS — M7710 Lateral epicondylitis, unspecified elbow: Secondary | ICD-10-CM | POA: Diagnosis not present

## 2012-04-17 DIAGNOSIS — H6120 Impacted cerumen, unspecified ear: Secondary | ICD-10-CM | POA: Diagnosis not present

## 2012-04-22 ENCOUNTER — Ambulatory Visit (INDEPENDENT_AMBULATORY_CARE_PROVIDER_SITE_OTHER): Payer: Medicare Other | Admitting: *Deleted

## 2012-04-22 DIAGNOSIS — Z7901 Long term (current) use of anticoagulants: Secondary | ICD-10-CM

## 2012-04-22 DIAGNOSIS — I4891 Unspecified atrial fibrillation: Secondary | ICD-10-CM | POA: Diagnosis not present

## 2012-04-22 LAB — POCT INR: INR: 2.6

## 2012-05-13 DIAGNOSIS — L821 Other seborrheic keratosis: Secondary | ICD-10-CM | POA: Diagnosis not present

## 2012-05-13 DIAGNOSIS — L570 Actinic keratosis: Secondary | ICD-10-CM | POA: Diagnosis not present

## 2012-05-13 DIAGNOSIS — L739 Follicular disorder, unspecified: Secondary | ICD-10-CM | POA: Diagnosis not present

## 2012-05-13 DIAGNOSIS — L819 Disorder of pigmentation, unspecified: Secondary | ICD-10-CM | POA: Diagnosis not present

## 2012-05-15 ENCOUNTER — Ambulatory Visit
Admission: RE | Admit: 2012-05-15 | Discharge: 2012-05-15 | Disposition: A | Discharge status: Home / Self Care | Payer: Medicare Other | Source: Ambulatory Visit

## 2012-05-15 DIAGNOSIS — Z1231 Encounter for screening mammogram for malignant neoplasm of breast: Secondary | ICD-10-CM

## 2012-05-20 ENCOUNTER — Ambulatory Visit (INDEPENDENT_AMBULATORY_CARE_PROVIDER_SITE_OTHER): Payer: Medicare Other | Admitting: *Deleted

## 2012-05-20 DIAGNOSIS — Z7901 Long term (current) use of anticoagulants: Secondary | ICD-10-CM

## 2012-05-20 DIAGNOSIS — I4891 Unspecified atrial fibrillation: Secondary | ICD-10-CM | POA: Diagnosis not present

## 2012-05-20 LAB — POCT INR: INR: 2.6

## 2012-06-24 ENCOUNTER — Encounter: Payer: Self-pay | Admitting: Cardiology

## 2012-06-24 ENCOUNTER — Ambulatory Visit (INDEPENDENT_AMBULATORY_CARE_PROVIDER_SITE_OTHER): Payer: Medicare Other | Admitting: Cardiology

## 2012-06-24 ENCOUNTER — Ambulatory Visit (INDEPENDENT_AMBULATORY_CARE_PROVIDER_SITE_OTHER): Payer: Medicare Other | Admitting: Pharmacist

## 2012-06-24 VITALS — BP 140/78 | HR 55 | Ht 66.25 in | Wt 147.0 lb

## 2012-06-24 DIAGNOSIS — Z7901 Long term (current) use of anticoagulants: Secondary | ICD-10-CM

## 2012-06-24 DIAGNOSIS — R0600 Dyspnea, unspecified: Secondary | ICD-10-CM

## 2012-06-24 DIAGNOSIS — I4891 Unspecified atrial fibrillation: Secondary | ICD-10-CM | POA: Diagnosis not present

## 2012-06-24 DIAGNOSIS — R0989 Other specified symptoms and signs involving the circulatory and respiratory systems: Secondary | ICD-10-CM | POA: Diagnosis not present

## 2012-06-24 DIAGNOSIS — R0609 Other forms of dyspnea: Secondary | ICD-10-CM | POA: Diagnosis not present

## 2012-06-24 DIAGNOSIS — R0602 Shortness of breath: Secondary | ICD-10-CM | POA: Insufficient documentation

## 2012-06-24 LAB — POCT INR: INR: 2.1

## 2012-06-24 NOTE — Progress Notes (Signed)
Gloria [name] Date of Birth: [birth year]   History of Present Illness: Gloria [name] seen today for followup. She has a history of atrial fibrillation controlled with flecainide. She denies any symptoms of palpitations. She has noticed more shortness of breath with exertion over the past 3 months associated with some mild chest pressure. She does do a lot of heavy yard work. She reports she has been under more stress because of her husband's multiple illnesses. She had complete lab work in February with her primary care and apparently everything looked okay.  Current Outpatient Prescriptions on File Prior to Visit  Medication Sig Dispense Refill  . calcium carbonate (OS-CAL) 600 MG TABS Take 600 mg by mouth 2 (two) times daily with a meal.        . erythromycin (E-MYCIN) 250 MG tablet Take 1 tablet by mouth Twice daily.      . flecainide (TAMBOCOR) 50 MG tablet Take 1 tablet (50 mg total) by mouth 2 (two) times daily.  180 tablet  1  . hydrochlorothiazide (HYDRODIURIL) 25 MG tablet Take 1 tablet (25 mg total) by mouth daily.  90 tablet  4  . omeprazole (PRILOSEC) 40 MG capsule Take 40 mg by mouth 2 (two) times daily.        Marland Kitchen warfarin (COUMADIN) 4 MG tablet Take as directed by coumadin clinic  130 tablet  1   No current facility-administered medications on file prior to visit.    Allergies  Allergen Reactions  . Amoxicillin Other (See Comments)    unknown  . Cephalexin Other (See Comments)    unknown  . Codeine Other (See Comments)    unknown  . Metoclopramide Hcl Other (See Comments)    unknown    Past Medical History  Diagnosis Date  . Hypertension   . Hyperlipidemia   . GERD (gastroesophageal reflux disease)     hx of  . Arthritis     right  . Atrial fibrillation   . Irritable bowel syndrome   . Meningioma   . Hepatitis C     Past Surgical History  Procedure Laterality Date  . Total hip arthroplasty  11/07    left  . Parotid gland tumor excision    . Total  knee arthroplasty    . Transthoracic echocardiogram  10/25/2009    EF 55-60%  . Cardiovascular stress test  11/14/2006    EF 67%    History  Smoking status  . Former Smoker -- 0.30 packs/day for 15 years  . Types: Cigarettes  . Quit date: 06/07/1980  Smokeless tobacco  . Never Used    History  Alcohol Use No    Family History  Problem Relation Age of Onset  . Heart disease Brother     Review of Systems: As noted in history of present illness.  All other systems were reviewed and are negative.  Physical Exam: BP 140/78  Pulse 55  Ht 5' 6.25" (1.683 m)  Wt 147 lb (66.679 kg)  BMI 23.54 kg/m2  SpO2 94% She is a pleasant white female in no acute distress. Her HEENT exam is unremarkable. She has no JVD or bruits. Lungs are clear. Cardiac exam reveals a regular rate and rhythm with a soft 1/6 systolic ejection murmur at right upper sternal border. Abdomen is soft and nontender without masses. She has no edema. Pedal pulses are good.  LABORATORY DATA: Last INR was 2.1 today.   Assessment / Plan: 1. Atrial fibrillation. This is well controlled  on flecainide. We will continue her current therapy.  2. Chronic anticoagulation, therapeutic. Continue Coumadin.  3. Hypertension, well controlled.  4. Dyspnea on exertion. Possible anginal equivalent. Her last stress test in 2000 he was unremarkable. I recommended a Lexiscan Myoview study.

## 2012-06-24 NOTE — Patient Instructions (Signed)
We will schedule you for a nuclear stress test.  Continue your current medication   

## 2012-06-29 DIAGNOSIS — H04129 Dry eye syndrome of unspecified lacrimal gland: Secondary | ICD-10-CM | POA: Diagnosis not present

## 2012-06-29 DIAGNOSIS — H16109 Unspecified superficial keratitis, unspecified eye: Secondary | ICD-10-CM | POA: Diagnosis not present

## 2012-06-29 DIAGNOSIS — H3530 Unspecified macular degeneration: Secondary | ICD-10-CM | POA: Diagnosis not present

## 2012-06-29 DIAGNOSIS — H18509 Unspecified hereditary corneal dystrophies, unspecified eye: Secondary | ICD-10-CM | POA: Diagnosis not present

## 2012-06-30 ENCOUNTER — Ambulatory Visit (HOSPITAL_COMMUNITY): Payer: Medicare Other | Attending: Cardiology | Admitting: Radiology

## 2012-06-30 VITALS — BP 132/71 | HR 50 | Ht 66.0 in | Wt 147.0 lb

## 2012-06-30 DIAGNOSIS — R002 Palpitations: Secondary | ICD-10-CM | POA: Insufficient documentation

## 2012-06-30 DIAGNOSIS — R0609 Other forms of dyspnea: Secondary | ICD-10-CM | POA: Diagnosis not present

## 2012-06-30 DIAGNOSIS — R079 Chest pain, unspecified: Secondary | ICD-10-CM | POA: Insufficient documentation

## 2012-06-30 DIAGNOSIS — R0600 Dyspnea, unspecified: Secondary | ICD-10-CM

## 2012-06-30 DIAGNOSIS — R5381 Other malaise: Secondary | ICD-10-CM | POA: Diagnosis not present

## 2012-06-30 DIAGNOSIS — I4891 Unspecified atrial fibrillation: Secondary | ICD-10-CM

## 2012-06-30 DIAGNOSIS — R0602 Shortness of breath: Secondary | ICD-10-CM | POA: Diagnosis not present

## 2012-06-30 DIAGNOSIS — Z7901 Long term (current) use of anticoagulants: Secondary | ICD-10-CM

## 2012-06-30 DIAGNOSIS — R0989 Other specified symptoms and signs involving the circulatory and respiratory systems: Secondary | ICD-10-CM | POA: Insufficient documentation

## 2012-06-30 MED ORDER — TECHNETIUM TC 99M SESTAMIBI GENERIC - CARDIOLITE
10.0000 | Freq: Once | INTRAVENOUS | Status: AC | PRN
  Administered 2012-06-30: 10 via INTRAVENOUS

## 2012-06-30 MED ORDER — REGADENOSON 0.4 MG/5ML IV SOLN
0.4000 mg | Freq: Once | INTRAVENOUS | Status: AC
  Administered 2012-06-30: 0.4 mg via INTRAVENOUS

## 2012-06-30 MED ORDER — TECHNETIUM TC 99M SESTAMIBI GENERIC - CARDIOLITE
30.0000 | Freq: Once | INTRAVENOUS | Status: AC | PRN
  Administered 2012-06-30: 30 via INTRAVENOUS

## 2012-06-30 NOTE — Progress Notes (Signed)
[facility] SITE 3 NUCLEAR MED [address] [place], [place] [address] [phone]    Cardiology Nuclear Med Study  Gloria [name] is a 79 y.o. female     MRN : [id number]     DOB: 02/19/1930  Procedure Date: 06/30/2012  Nuclear Med Background Indication for Stress Test:  Evaluation for Ischemia History:  No prior known history of CAD; AFIB;'08 Myocardial Perfusion Study-Normal, EF=67%; '11 Echo: EF=55-60% Cardiac Risk Factors: Family History - CAD, History of Smoking, Hypertension and Lipids  Symptoms: Chest Pressure with/without exertion (last occurrence one week ago),  Diaphoresis, DOE, Fatigue, Fatigue with Exertion, Palpitations, Rapid HR and SOB   Nuclear Pre-Procedure Caffeine/Decaff Intake:  None NPO After: 6:30am   Lungs:  clear O2 Sat: 99% on room air. IV 0.9% NS with Angio Cath:  20g  IV Site: R Antecubital  IV Started by:  Stanton Kidney, EMT-P  Chest Size (in):  38 Cup Size: DD  Height: 5\' 6"  (1.676 m)  Weight:  147 lb (66.679 kg)  BMI:  Body mass index is 23.74 kg/(m^2). Tech Comments:  Meds were taken as directed, per patient.    Nuclear Med Study 1 or 2 day study: 1 day  Stress Test Type:  Lexiscan  Reading MD: Olga Millers, MD  Order Authorizing Provider:  Peter Swaziland, MD  Resting Radionuclide: Technetium 75m Sestamibi  Resting Radionuclide Dose: 11.0 mCi   Stress Radionuclide:  Technetium 68m Sestamibi  Stress Radionuclide Dose: 33.0 mCi           Stress Protocol Rest HR: 50 Stress HR: 73  Rest BP: 132/71 Stress BP: 134/73  Exercise Time (min): n/a METS: n/a   Predicted Max HR: 140 bpm % Max HR: 52.14 bpm Rate Pressure Product: 9782   Dose of Adenosine (mg):  n/a Dose of Lexiscan: 0.4 mg  Dose of Atropine (mg): n/a Dose of Dobutamine: n/a mcg/kg/min (at max HR)  Stress Test Technologist: Irean Hong, RN  Nuclear Technologist:  Domenic Polite, CNMT     Rest Procedure:  Myocardial perfusion imaging was performed at rest 45 minutes  following the intravenous administration of Technetium 66m Sestamibi. Rest ECG: Sinus bradycardia, CRO prior septal MI.  Stress Procedure:  The patient received IV Lexiscan 0.4 mg over 15-seconds.  Technetium 22m Sestamibi injected at 30-seconds. The patient complained of Chest Pressure, Pain between shoulders, and headache.  Quantitative spect images were obtained after a 45 minute delay. Stress ECG: No significant ST segment change suggestive of ischemia.  QPS Raw Data Images:  Acquisition technically good; normal left ventricular size. Stress Images:  There is decreased uptake in the inferior wall and apex. Rest Images:  There is decreased uptake in the inferior wall and apex. Subtraction (SDS):  No evidence of ischemia. Transient Ischemic Dilatation (Normal <1.22):  1.08 Lung/Heart Ratio (Normal <0.45):  0.35  Quantitative Gated Spect Images QGS EDV:  80 ml QGS ESV:  25 ml  Impression Exercise Capacity:  Lexiscan with no exercise. BP Response:  Normal blood pressure response. Clinical Symptoms:  There is chest pain. ECG Impression:  No significant ST segment change suggestive of ischemia. Comparison with Prior Nuclear Study: No images to compare  Overall Impression:  Low risk stress nuclear study with a small, moderate intensity, fixed inferior and apical defect suggestive of thinning; no ischemia..  LV Ejection Fraction: 68%.  LV Wall Motion:  NL LV Function; NL Wall Motion  Olga Millers

## 2012-08-05 ENCOUNTER — Ambulatory Visit (INDEPENDENT_AMBULATORY_CARE_PROVIDER_SITE_OTHER): Payer: Medicare Other | Admitting: *Deleted

## 2012-08-05 DIAGNOSIS — I4891 Unspecified atrial fibrillation: Secondary | ICD-10-CM

## 2012-08-05 DIAGNOSIS — Z7901 Long term (current) use of anticoagulants: Secondary | ICD-10-CM | POA: Diagnosis not present

## 2012-08-05 LAB — POCT INR: INR: 1.8

## 2012-08-31 DIAGNOSIS — M25473 Effusion, unspecified ankle: Secondary | ICD-10-CM | POA: Diagnosis not present

## 2012-08-31 DIAGNOSIS — I4891 Unspecified atrial fibrillation: Secondary | ICD-10-CM | POA: Diagnosis not present

## 2012-08-31 DIAGNOSIS — M79609 Pain in unspecified limb: Secondary | ICD-10-CM | POA: Diagnosis not present

## 2012-08-31 DIAGNOSIS — IMO0002 Reserved for concepts with insufficient information to code with codable children: Secondary | ICD-10-CM | POA: Diagnosis not present

## 2012-09-02 ENCOUNTER — Ambulatory Visit (INDEPENDENT_AMBULATORY_CARE_PROVIDER_SITE_OTHER): Payer: Medicare Other | Admitting: *Deleted

## 2012-09-02 DIAGNOSIS — Z7901 Long term (current) use of anticoagulants: Secondary | ICD-10-CM | POA: Diagnosis not present

## 2012-09-02 DIAGNOSIS — I4891 Unspecified atrial fibrillation: Secondary | ICD-10-CM | POA: Diagnosis not present

## 2012-09-02 LAB — POCT INR: INR: 2.5

## 2012-09-02 MED ORDER — WARFARIN SODIUM 4 MG PO TABS: ORAL_TABLET | ORAL | Status: DC

## 2012-09-30 ENCOUNTER — Ambulatory Visit (INDEPENDENT_AMBULATORY_CARE_PROVIDER_SITE_OTHER): Payer: Medicare Other | Admitting: *Deleted

## 2012-09-30 DIAGNOSIS — I4891 Unspecified atrial fibrillation: Secondary | ICD-10-CM | POA: Diagnosis not present

## 2012-09-30 DIAGNOSIS — Z7901 Long term (current) use of anticoagulants: Secondary | ICD-10-CM | POA: Diagnosis not present

## 2012-09-30 LAB — POCT INR: INR: 2.9

## 2012-10-08 DIAGNOSIS — Z23 Encounter for immunization: Secondary | ICD-10-CM | POA: Diagnosis not present

## 2012-10-28 ENCOUNTER — Ambulatory Visit (INDEPENDENT_AMBULATORY_CARE_PROVIDER_SITE_OTHER): Payer: Medicare Other | Admitting: *Deleted

## 2012-10-28 DIAGNOSIS — I4891 Unspecified atrial fibrillation: Secondary | ICD-10-CM | POA: Diagnosis not present

## 2012-10-28 DIAGNOSIS — Z7901 Long term (current) use of anticoagulants: Secondary | ICD-10-CM | POA: Diagnosis not present

## 2012-10-28 LAB — POCT INR: INR: 3

## 2012-11-10 ENCOUNTER — Encounter: Payer: Self-pay | Admitting: Cardiology

## 2012-12-09 ENCOUNTER — Encounter (INDEPENDENT_AMBULATORY_CARE_PROVIDER_SITE_OTHER): Payer: Self-pay

## 2012-12-09 ENCOUNTER — Ambulatory Visit (INDEPENDENT_AMBULATORY_CARE_PROVIDER_SITE_OTHER): Payer: Medicare Other | Admitting: *Deleted

## 2012-12-09 DIAGNOSIS — Z7901 Long term (current) use of anticoagulants: Secondary | ICD-10-CM | POA: Diagnosis not present

## 2012-12-09 DIAGNOSIS — I4891 Unspecified atrial fibrillation: Secondary | ICD-10-CM

## 2012-12-09 LAB — POCT INR: INR: 2.4

## 2012-12-24 ENCOUNTER — Ambulatory Visit (INDEPENDENT_AMBULATORY_CARE_PROVIDER_SITE_OTHER): Payer: Medicare Other | Admitting: Cardiology

## 2012-12-24 ENCOUNTER — Encounter: Payer: Self-pay | Admitting: Cardiology

## 2012-12-24 VITALS — BP 153/61 | HR 55 | Ht 63.0 in | Wt 153.2 lb

## 2012-12-24 DIAGNOSIS — Z7901 Long term (current) use of anticoagulants: Secondary | ICD-10-CM

## 2012-12-24 DIAGNOSIS — I4891 Unspecified atrial fibrillation: Secondary | ICD-10-CM | POA: Diagnosis not present

## 2012-12-24 DIAGNOSIS — I10 Essential (primary) hypertension: Secondary | ICD-10-CM

## 2012-12-24 NOTE — Patient Instructions (Signed)
Continue your current therapy  I will see you in 6 months.   

## 2012-12-25 NOTE — Progress Notes (Signed)
Gloria [name] Date of Birth: 1930/02/10   History of Present Illness: [name] seen today for followup. She has a history of atrial fibrillation controlled with flecainide. She denies any symptoms of palpitations. She denies any chest pain or SOB.She  has been under increased stress because of her husband's lymphoma treatment. Myoview study in 6/14 was normal.  Current Outpatient Prescriptions on File Prior to Visit  Medication Sig Dispense Refill  . calcium carbonate (OS-CAL) 600 MG TABS Take 600 mg by mouth 2 (two) times daily with a meal.        . Cholecalciferol (VITAMIN D PO) Take by mouth.      . erythromycin (E-MYCIN) 250 MG tablet Take 1 tablet by mouth Twice daily.      . flecainide (TAMBOCOR) 50 MG tablet Take 1 tablet (50 mg total) by mouth 2 (two) times daily.  180 tablet  1  . hydrochlorothiazide (HYDRODIURIL) 25 MG tablet Take 1 tablet (25 mg total) by mouth daily.  90 tablet  4  . omeprazole (PRILOSEC) 40 MG capsule Take 40 mg by mouth 2 (two) times daily.        Marland Kitchen warfarin (COUMADIN) 4 MG tablet Take as directed by coumadin clinic  150 tablet  1   No current facility-administered medications on file prior to visit.    Allergies  Allergen Reactions  . Amoxicillin Other (See Comments)    unknown  . Cephalexin Other (See Comments)    unknown  . Codeine Other (See Comments)    unknown  . Metoclopramide Hcl Other (See Comments)    unknown    Past Medical History  Diagnosis Date  . Hypertension   . Hyperlipidemia   . GERD (gastroesophageal reflux disease)     hx of  . Arthritis     right  . Atrial fibrillation   . Irritable bowel syndrome   . Meningioma   . Hepatitis C     Past Surgical History  Procedure Laterality Date  . Total hip arthroplasty  11/07    left  . Parotid gland tumor excision    . Total knee arthroplasty    . Transthoracic echocardiogram  10/25/2009    EF 55-60%  . Cardiovascular stress test  11/14/2006    EF 67%    History   Smoking status  . Former Smoker -- 0.30 packs/day for 15 years  . Types: Cigarettes  . Quit date: 06/07/1980  Smokeless tobacco  . Never Used    History  Alcohol Use No    Family History  Problem Relation Age of Onset  . Heart disease Brother     Review of Systems: As noted in history of present illness.  All other systems were reviewed and are negative.  Physical Exam: BP 153/61  Pulse 55  Ht 5\' 3"  (1.6 m)  Wt 153 lb 3.2 oz (69.491 kg)  BMI 27.14 kg/m2 She is a pleasant white female in no acute distress. Her HEENT exam is unremarkable. She has no JVD or bruits. Lungs are clear. Cardiac exam reveals a regular rate and rhythm with a soft 1/6 systolic ejection murmur at right upper sternal border. Abdomen is soft and nontender without masses. She has no edema. Pedal pulses are good.  LABORATORY DATA: Last INR was 2.4.  Ecg: NSR with rate 55 bpm. LAD, LVH by voltage.   Assessment / Plan: 1. Atrial fibrillation. This is well controlled on flecainide. We will continue her current therapy.  2. Chronic anticoagulation,  therapeutic. Continue Coumadin.  3. Hypertension, well controlled.

## 2013-01-28 ENCOUNTER — Ambulatory Visit (INDEPENDENT_AMBULATORY_CARE_PROVIDER_SITE_OTHER): Payer: Medicare Other | Admitting: *Deleted

## 2013-01-28 DIAGNOSIS — Z7901 Long term (current) use of anticoagulants: Secondary | ICD-10-CM | POA: Diagnosis not present

## 2013-01-28 DIAGNOSIS — I4891 Unspecified atrial fibrillation: Secondary | ICD-10-CM

## 2013-01-28 LAB — POCT INR: INR: 3.5

## 2013-02-18 ENCOUNTER — Ambulatory Visit (INDEPENDENT_AMBULATORY_CARE_PROVIDER_SITE_OTHER): Payer: Medicare Other | Admitting: *Deleted

## 2013-02-18 DIAGNOSIS — I4891 Unspecified atrial fibrillation: Secondary | ICD-10-CM

## 2013-02-18 DIAGNOSIS — Z7901 Long term (current) use of anticoagulants: Secondary | ICD-10-CM

## 2013-02-18 LAB — POCT INR: INR: 2

## 2013-03-03 ENCOUNTER — Other Ambulatory Visit: Payer: Self-pay

## 2013-03-03 MED ORDER — HYDROCHLOROTHIAZIDE 25 MG PO TABS: 25.0000 mg | ORAL_TABLET | Freq: Every day | ORAL | Status: DC

## 2013-03-18 ENCOUNTER — Ambulatory Visit (INDEPENDENT_AMBULATORY_CARE_PROVIDER_SITE_OTHER): Payer: Medicare Other

## 2013-03-18 DIAGNOSIS — I4891 Unspecified atrial fibrillation: Secondary | ICD-10-CM

## 2013-03-18 DIAGNOSIS — Z7901 Long term (current) use of anticoagulants: Secondary | ICD-10-CM | POA: Diagnosis not present

## 2013-03-18 LAB — POCT INR: INR: 3.1

## 2013-04-07 ENCOUNTER — Other Ambulatory Visit: Payer: Self-pay | Admitting: Cardiology

## 2013-04-07 DIAGNOSIS — M949 Disorder of cartilage, unspecified: Secondary | ICD-10-CM | POA: Diagnosis not present

## 2013-04-07 DIAGNOSIS — I10 Essential (primary) hypertension: Secondary | ICD-10-CM | POA: Diagnosis not present

## 2013-04-07 DIAGNOSIS — M899 Disorder of bone, unspecified: Secondary | ICD-10-CM | POA: Diagnosis not present

## 2013-04-07 DIAGNOSIS — R809 Proteinuria, unspecified: Secondary | ICD-10-CM | POA: Diagnosis not present

## 2013-04-07 DIAGNOSIS — E785 Hyperlipidemia, unspecified: Secondary | ICD-10-CM | POA: Diagnosis not present

## 2013-04-07 DIAGNOSIS — R82998 Other abnormal findings in urine: Secondary | ICD-10-CM | POA: Diagnosis not present

## 2013-04-12 ENCOUNTER — Other Ambulatory Visit: Payer: Self-pay

## 2013-04-12 DIAGNOSIS — Z1231 Encounter for screening mammogram for malignant neoplasm of breast: Secondary | ICD-10-CM

## 2013-04-14 DIAGNOSIS — M949 Disorder of cartilage, unspecified: Secondary | ICD-10-CM | POA: Diagnosis not present

## 2013-04-14 DIAGNOSIS — M899 Disorder of bone, unspecified: Secondary | ICD-10-CM | POA: Diagnosis not present

## 2013-04-14 DIAGNOSIS — I10 Essential (primary) hypertension: Secondary | ICD-10-CM | POA: Diagnosis not present

## 2013-04-14 DIAGNOSIS — K3184 Gastroparesis: Secondary | ICD-10-CM | POA: Diagnosis not present

## 2013-04-14 DIAGNOSIS — Z6826 Body mass index (BMI) 26.0-26.9, adult: Secondary | ICD-10-CM | POA: Diagnosis not present

## 2013-04-14 DIAGNOSIS — I4891 Unspecified atrial fibrillation: Secondary | ICD-10-CM | POA: Diagnosis not present

## 2013-04-14 DIAGNOSIS — Z79899 Other long term (current) drug therapy: Secondary | ICD-10-CM | POA: Diagnosis not present

## 2013-04-14 DIAGNOSIS — Z1331 Encounter for screening for depression: Secondary | ICD-10-CM | POA: Diagnosis not present

## 2013-04-14 DIAGNOSIS — K219 Gastro-esophageal reflux disease without esophagitis: Secondary | ICD-10-CM | POA: Diagnosis not present

## 2013-04-14 DIAGNOSIS — Z0000 Encounter for general adult medical examination without abnormal findings: Secondary | ICD-10-CM | POA: Diagnosis not present

## 2013-04-15 ENCOUNTER — Ambulatory Visit (INDEPENDENT_AMBULATORY_CARE_PROVIDER_SITE_OTHER): Payer: Medicare Other

## 2013-04-15 DIAGNOSIS — I4891 Unspecified atrial fibrillation: Secondary | ICD-10-CM | POA: Diagnosis not present

## 2013-04-15 DIAGNOSIS — Z7901 Long term (current) use of anticoagulants: Secondary | ICD-10-CM

## 2013-04-15 LAB — POCT INR: INR: 1.9

## 2013-04-16 DIAGNOSIS — Z1212 Encounter for screening for malignant neoplasm of rectum: Secondary | ICD-10-CM | POA: Diagnosis not present

## 2013-04-28 ENCOUNTER — Emergency Department (HOSPITAL_COMMUNITY): Payer: Medicare Other

## 2013-04-28 ENCOUNTER — Inpatient Hospital Stay (HOSPITAL_COMMUNITY)
Admission: EM | Admit: 2013-04-28 | Discharge: 2013-05-04 | DRG: 482 | Disposition: A | Discharge status: Skilled Nursing Facility | Payer: Medicare Other | Attending: Internal Medicine | Admitting: Internal Medicine

## 2013-04-28 ENCOUNTER — Encounter (HOSPITAL_COMMUNITY): Payer: Self-pay | Admitting: Emergency Medicine

## 2013-04-28 DIAGNOSIS — W19XXXA Unspecified fall, initial encounter: Secondary | ICD-10-CM

## 2013-04-28 DIAGNOSIS — Z87891 Personal history of nicotine dependence: Secondary | ICD-10-CM | POA: Diagnosis not present

## 2013-04-28 DIAGNOSIS — K759 Inflammatory liver disease, unspecified: Secondary | ICD-10-CM | POA: Diagnosis not present

## 2013-04-28 DIAGNOSIS — M1710 Unilateral primary osteoarthritis, unspecified knee: Secondary | ICD-10-CM | POA: Diagnosis not present

## 2013-04-28 DIAGNOSIS — T84049A Periprosthetic fracture around unspecified internal prosthetic joint, initial encounter: Principal | ICD-10-CM | POA: Diagnosis present

## 2013-04-28 DIAGNOSIS — K5900 Constipation, unspecified: Secondary | ICD-10-CM

## 2013-04-28 DIAGNOSIS — E785 Hyperlipidemia, unspecified: Secondary | ICD-10-CM | POA: Diagnosis present

## 2013-04-28 DIAGNOSIS — K219 Gastro-esophageal reflux disease without esophagitis: Secondary | ICD-10-CM | POA: Diagnosis present

## 2013-04-28 DIAGNOSIS — I4891 Unspecified atrial fibrillation: Secondary | ICD-10-CM | POA: Diagnosis not present

## 2013-04-28 DIAGNOSIS — Z96659 Presence of unspecified artificial knee joint: Secondary | ICD-10-CM

## 2013-04-28 DIAGNOSIS — Z9660 Presence of unspecified orthopedic joint implant: Principal | ICD-10-CM | POA: Diagnosis present

## 2013-04-28 DIAGNOSIS — M25529 Pain in unspecified elbow: Secondary | ICD-10-CM | POA: Diagnosis not present

## 2013-04-28 DIAGNOSIS — Z79899 Other long term (current) drug therapy: Secondary | ICD-10-CM | POA: Diagnosis not present

## 2013-04-28 DIAGNOSIS — IMO0002 Reserved for concepts with insufficient information to code with codable children: Secondary | ICD-10-CM | POA: Diagnosis not present

## 2013-04-28 DIAGNOSIS — M25559 Pain in unspecified hip: Secondary | ICD-10-CM | POA: Diagnosis not present

## 2013-04-28 DIAGNOSIS — Z96649 Presence of unspecified artificial hip joint: Secondary | ICD-10-CM

## 2013-04-28 DIAGNOSIS — R079 Chest pain, unspecified: Secondary | ICD-10-CM | POA: Diagnosis not present

## 2013-04-28 DIAGNOSIS — Z7901 Long term (current) use of anticoagulants: Secondary | ICD-10-CM

## 2013-04-28 DIAGNOSIS — B1920 Unspecified viral hepatitis C without hepatic coma: Secondary | ICD-10-CM | POA: Diagnosis present

## 2013-04-28 DIAGNOSIS — M79609 Pain in unspecified limb: Secondary | ICD-10-CM | POA: Diagnosis not present

## 2013-04-28 DIAGNOSIS — Y831 Surgical operation with implant of artificial internal device as the cause of abnormal reaction of the patient, or of later complication, without mention of misadventure at the time of the procedure: Secondary | ICD-10-CM | POA: Diagnosis present

## 2013-04-28 DIAGNOSIS — S72002A Fracture of unspecified part of neck of left femur, initial encounter for closed fracture: Secondary | ICD-10-CM

## 2013-04-28 DIAGNOSIS — Z5189 Encounter for other specified aftercare: Secondary | ICD-10-CM | POA: Diagnosis not present

## 2013-04-28 DIAGNOSIS — R51 Headache: Secondary | ICD-10-CM | POA: Diagnosis not present

## 2013-04-28 DIAGNOSIS — S79929A Unspecified injury of unspecified thigh, initial encounter: Secondary | ICD-10-CM | POA: Diagnosis not present

## 2013-04-28 DIAGNOSIS — S72009D Fracture of unspecified part of neck of unspecified femur, subsequent encounter for closed fracture with routine healing: Secondary | ICD-10-CM | POA: Diagnosis not present

## 2013-04-28 DIAGNOSIS — S72009A Fracture of unspecified part of neck of unspecified femur, initial encounter for closed fracture: Secondary | ICD-10-CM | POA: Diagnosis not present

## 2013-04-28 DIAGNOSIS — R9431 Abnormal electrocardiogram [ECG] [EKG]: Secondary | ICD-10-CM | POA: Diagnosis not present

## 2013-04-28 DIAGNOSIS — M1120 Other chondrocalcinosis, unspecified site: Secondary | ICD-10-CM | POA: Diagnosis not present

## 2013-04-28 DIAGNOSIS — S72043A Displaced fracture of base of neck of unspecified femur, initial encounter for closed fracture: Secondary | ICD-10-CM | POA: Diagnosis not present

## 2013-04-28 DIAGNOSIS — S298XXA Other specified injuries of thorax, initial encounter: Secondary | ICD-10-CM | POA: Diagnosis not present

## 2013-04-28 DIAGNOSIS — S7290XA Unspecified fracture of unspecified femur, initial encounter for closed fracture: Secondary | ICD-10-CM | POA: Diagnosis not present

## 2013-04-28 DIAGNOSIS — I10 Essential (primary) hypertension: Secondary | ICD-10-CM | POA: Diagnosis not present

## 2013-04-28 DIAGNOSIS — E782 Mixed hyperlipidemia: Secondary | ICD-10-CM | POA: Diagnosis not present

## 2013-04-28 DIAGNOSIS — M25569 Pain in unspecified knee: Secondary | ICD-10-CM | POA: Diagnosis not present

## 2013-04-28 DIAGNOSIS — I480 Paroxysmal atrial fibrillation: Secondary | ICD-10-CM | POA: Diagnosis present

## 2013-04-28 DIAGNOSIS — S79919A Unspecified injury of unspecified hip, initial encounter: Secondary | ICD-10-CM | POA: Diagnosis not present

## 2013-04-28 DIAGNOSIS — Y92009 Unspecified place in unspecified non-institutional (private) residence as the place of occurrence of the external cause: Secondary | ICD-10-CM

## 2013-04-28 DIAGNOSIS — S59909A Unspecified injury of unspecified elbow, initial encounter: Secondary | ICD-10-CM | POA: Diagnosis not present

## 2013-04-28 DIAGNOSIS — S0990XA Unspecified injury of head, initial encounter: Secondary | ICD-10-CM | POA: Diagnosis not present

## 2013-04-28 DIAGNOSIS — M25562 Pain in left knee: Secondary | ICD-10-CM

## 2013-04-28 DIAGNOSIS — M9702XA Periprosthetic fracture around internal prosthetic left hip joint, initial encounter: Secondary | ICD-10-CM

## 2013-04-28 LAB — COMPREHENSIVE METABOLIC PANEL
ALT: 16 U/L (ref 0–35)
AST: 23 U/L (ref 0–37)
Albumin: 3.8 g/dL (ref 3.5–5.2)
Alkaline Phosphatase: 120 U/L — ABNORMAL HIGH (ref 39–117)
BUN: 22 mg/dL (ref 6–23)
CO2: 23 mEq/L (ref 19–32)
Calcium: 9.5 mg/dL (ref 8.4–10.5)
Chloride: 104 mEq/L (ref 96–112)
Creatinine, Ser: 0.73 mg/dL (ref 0.50–1.10)
GFR calc Af Amer: >90 mL/min (ref >90)
GFR calc non Af Amer: 79 mL/min — ABNORMAL LOW (ref >90)
Glucose, Bld: 129 mg/dL — ABNORMAL HIGH (ref 70–99)
Potassium: 4 mEq/L (ref 3.7–5.3)
Sodium: 143 mEq/L (ref 137–147)
Total Bilirubin: 0.6 mg/dL (ref 0.3–1.2)
Total Protein: 6.7 g/dL (ref 6.0–8.3)

## 2013-04-28 LAB — URINALYSIS, ROUTINE W REFLEX MICROSCOPIC
Bilirubin Urine: NEGATIVE
Glucose, UA: NEGATIVE mg/dL
Hgb urine dipstick: NEGATIVE
Ketones, ur: 15 mg/dL — AB
Leukocytes, UA: NEGATIVE
Nitrite: NEGATIVE
Protein, ur: NEGATIVE mg/dL
Specific Gravity, Urine: 1.02 (ref 1.005–1.030)
Urobilinogen, UA: 0.2 mg/dL (ref 0.0–1.0)
pH: 8 (ref 5.0–8.0)

## 2013-04-28 LAB — CBC WITH DIFFERENTIAL/PLATELET
Basophils Absolute: 0 10*3/uL (ref 0.0–0.1)
Basophils Relative: 0 % (ref 0–1)
Eosinophils Absolute: 0.1 10*3/uL (ref 0.0–0.7)
Eosinophils Relative: 2 % (ref 0–5)
HCT: 42.2 % (ref 36.0–46.0)
Hemoglobin: 13.8 g/dL (ref 12.0–15.0)
Lymphocytes Relative: 20 % (ref 12–46)
Lymphs Abs: 1.2 10*3/uL (ref 0.7–4.0)
MCH: 29.4 pg (ref 26.0–34.0)
MCHC: 32.7 g/dL (ref 30.0–36.0)
MCV: 90 fL (ref 78.0–100.0)
Monocytes Absolute: 0.4 10*3/uL (ref 0.1–1.0)
Monocytes Relative: 7 % (ref 3–12)
Neutro Abs: 4.3 10*3/uL (ref 1.7–7.7)
Neutrophils Relative %: 71 % (ref 43–77)
Platelets: 234 10*3/uL (ref 150–400)
RBC: 4.69 MIL/uL (ref 3.87–5.11)
RDW: 14.2 % (ref 11.5–15.5)
WBC: 6 10*3/uL (ref 4.0–10.5)

## 2013-04-28 LAB — PROTIME-INR
INR: 1.64 — ABNORMAL HIGH (ref 0.00–1.49)
Prothrombin Time: 19 seconds — ABNORMAL HIGH (ref 11.6–15.2)

## 2013-04-28 LAB — TROPONIN I: Troponin I: <0.3 ng/mL (ref <0.30)

## 2013-04-28 LAB — TYPE AND SCREEN
ABO/RH(D): O POS
Antibody Screen: NEGATIVE

## 2013-04-28 MED ORDER — MORPHINE SULFATE 2 MG/ML IJ SOLN
0.5000 mg | INTRAMUSCULAR | Status: DC | PRN
  Administered 2013-04-29: 0.5 mg via INTRAVENOUS
  Filled 2013-04-28 (×2): qty 1

## 2013-04-28 MED ORDER — TETANUS-DIPHTH-ACELL PERTUSSIS 5-2.5-18.5 LF-MCG/0.5 IM SUSP
0.5000 mL | Freq: Once | INTRAMUSCULAR | Status: AC
  Administered 2013-04-28: 0.5 mL via INTRAMUSCULAR
  Filled 2013-04-28: qty 0.5

## 2013-04-28 MED ORDER — SODIUM CHLORIDE 0.9 % IV BOLUS (SEPSIS)
500.0000 mL | Freq: Once | INTRAVENOUS | Status: AC
  Administered 2013-04-28: 500 mL via INTRAVENOUS

## 2013-04-28 MED ORDER — OMEPRAZOLE MAGNESIUM 20 MG PO TBEC: 20.0000 mg | DELAYED_RELEASE_TABLET | Freq: Two times a day (BID) | ORAL | Status: DC

## 2013-04-28 MED ORDER — FLECAINIDE ACETATE 50 MG PO TABS
50.0000 mg | ORAL_TABLET | Freq: Two times a day (BID) | ORAL | Status: DC
  Administered 2013-04-28 – 2013-05-04 (×12): 50 mg via ORAL
  Filled 2013-04-28 (×13): qty 1

## 2013-04-28 MED ORDER — FENTANYL CITRATE 0.05 MG/ML IJ SOLN
50.0000 ug | Freq: Once | INTRAMUSCULAR | Status: AC
  Administered 2013-04-28: 50 ug via INTRAVENOUS
  Filled 2013-04-28: qty 2

## 2013-04-28 MED ORDER — PANTOPRAZOLE SODIUM 40 MG PO TBEC
40.0000 mg | DELAYED_RELEASE_TABLET | Freq: Two times a day (BID) | ORAL | Status: DC
  Administered 2013-04-29 – 2013-05-04 (×11): 40 mg via ORAL
  Filled 2013-04-28 (×12): qty 1

## 2013-04-28 MED ORDER — HEPARIN (PORCINE) IN NACL 100-0.45 UNIT/ML-% IJ SOLN
1100.0000 [IU]/h | INTRAMUSCULAR | Status: DC
  Administered 2013-04-28: 900 [IU]/h via INTRAVENOUS
  Administered 2013-04-29: 1100 [IU]/h via INTRAVENOUS
  Filled 2013-04-28 (×4): qty 250

## 2013-04-28 MED ORDER — VITAMIN B-12 1000 MCG PO TABS
1000.0000 ug | ORAL_TABLET | Freq: Every day | ORAL | Status: DC
  Administered 2013-04-29 – 2013-05-04 (×5): 1000 ug via ORAL
  Filled 2013-04-28 (×6): qty 1

## 2013-04-28 MED ORDER — HYDROCODONE-ACETAMINOPHEN 5-325 MG PO TABS
1.0000 | ORAL_TABLET | Freq: Four times a day (QID) | ORAL | Status: DC | PRN
  Administered 2013-04-28 – 2013-04-30 (×5): 2 via ORAL
  Administered 2013-05-03 – 2013-05-04 (×3): 1 via ORAL
  Filled 2013-04-28: qty 2
  Filled 2013-04-28: qty 1
  Filled 2013-04-28 (×8): qty 2

## 2013-04-28 MED ORDER — HYDROCHLOROTHIAZIDE 25 MG PO TABS
25.0000 mg | ORAL_TABLET | Freq: Every day | ORAL | Status: DC
  Administered 2013-04-29 – 2013-05-04 (×6): 25 mg via ORAL
  Filled 2013-04-28 (×6): qty 1

## 2013-04-28 NOTE — ED Notes (Signed)
Hospitalist at bedside 

## 2013-04-28 NOTE — Progress Notes (Signed)
Orthopedic Tech Progress Note Patient Details:  Gloria [name] August 10, 1930 [id number]  Musculoskeletal Traction Type of Traction: Bucks Skin Traction Traction Location: LLE Traction Weight: 5 lbs    Braulio Bosch 04/28/2013, 8:50 PM

## 2013-04-28 NOTE — Consult Note (Signed)
Reason for Consult: Left hip fracture Referring Physician: [facility]  Gloria [name] is an 80 y.o. female.  HPI: Patient presents to the [facility] Ed follow a fall today. She has a history of Hepatitis C and A. Fib for which she is on coumadin. She reports falling today on her Left hip in her driveway. She did not loose consciousness. She had a immediate onset of pain in the buttock region. Imaging was obtained and showed a Left periprosthetic fracture. Other work up has been negative. She is being admitted by medicine. She had bilateral hip replacement done by Dr. Veverly Fells. Denies SOB, CP, or palpations. Family at bedside.   Past Medical History  Diagnosis Date  . Hypertension   . Hyperlipidemia   . GERD (gastroesophageal reflux disease)     hx of  . Arthritis     right  . Atrial fibrillation   . Irritable bowel syndrome   . Meningioma   . Hepatitis C     Past Surgical History  Procedure Laterality Date  . Total hip arthroplasty  11/07    left  . Parotid gland tumor excision    . Total knee arthroplasty    . Transthoracic echocardiogram  10/25/2009    EF 55-60%  . Cardiovascular stress test  11/14/2006    EF 67%    Family History  Problem Relation Age of Onset  . Heart disease Brother     Social History:  reports that she quit smoking about 32 years ago. Her smoking use included Cigarettes. She has a 4.5 pack-year smoking history. She has never used smokeless tobacco. She reports that she does not drink alcohol or use illicit drugs.  Allergies:  Allergies  Allergen Reactions  . Amoxicillin Other (See Comments)    unknown  . Cephalexin Other (See Comments)    unknown  . Codeine Other (See Comments)    unknown  . Metoclopramide Hcl Other (See Comments)    unknown    Medications: I have reviewed the patient's current medications.  Results for orders placed during the hospital encounter of 04/28/13 (from the past 48 hour(s))  CBC WITH DIFFERENTIAL     Status: None   Collection Time    04/28/13  3:29 PM      Result Value Ref Range   WBC 6.0  4.0 - 10.5 K/uL   RBC 4.69  3.87 - 5.11 MIL/uL   Hemoglobin 13.8  12.0 - 15.0 g/dL   HCT 42.2  36.0 - 46.0 %   MCV 90.0  78.0 - 100.0 fL   MCH 29.4  26.0 - 34.0 pg   MCHC 32.7  30.0 - 36.0 g/dL   RDW 14.2  11.5 - 15.5 %   Platelets 234  150 - 400 K/uL   Neutrophils Relative % 71  43 - 77 %   Neutro Abs 4.3  1.7 - 7.7 K/uL   Lymphocytes Relative 20  12 - 46 %   Lymphs Abs 1.2  0.7 - 4.0 K/uL   Monocytes Relative 7  3 - 12 %   Monocytes Absolute 0.4  0.1 - 1.0 K/uL   Eosinophils Relative 2  0 - 5 %   Eosinophils Absolute 0.1  0.0 - 0.7 K/uL   Basophils Relative 0  0 - 1 %   Basophils Absolute 0.0  0.0 - 0.1 K/uL  COMPREHENSIVE METABOLIC PANEL     Status: Abnormal   Collection Time    04/28/13  3:29 PM  Result Value Ref Range   Sodium 143  137 - 147 mEq/L   Potassium 4.0  3.7 - 5.3 mEq/L   Chloride 104  96 - 112 mEq/L   CO2 23  19 - 32 mEq/L   Glucose, Bld 129 (*) 70 - 99 mg/dL   BUN 22  6 - 23 mg/dL   Creatinine, Ser 0.73  0.50 - 1.10 mg/dL   Calcium 9.5  8.4 - 10.5 mg/dL   Total Protein 6.7  6.0 - 8.3 g/dL   Albumin 3.8  3.5 - 5.2 g/dL   AST 23  0 - 37 U/L   Comment: HEMOLYSIS AT THIS LEVEL MAY AFFECT RESULT   ALT 16  0 - 35 U/L   Alkaline Phosphatase 120 (*) 39 - 117 U/L   Total Bilirubin 0.6  0.3 - 1.2 mg/dL   GFR calc non Af Amer 79 (*) >90 mL/min   GFR calc Af Amer >90  >90 mL/min   Comment: (NOTE)     The eGFR has been calculated using the CKD EPI equation.     This calculation has not been validated in all clinical situations.     eGFR's persistently <90 mL/min signify possible Chronic Kidney     Disease.  PROTIME-INR     Status: Abnormal   Collection Time    04/28/13  3:29 PM      Result Value Ref Range   Prothrombin Time 19.0 (*) 11.6 - 15.2 seconds   INR 1.64 (*) 0.00 - 1.49  TROPONIN I     Status: None   Collection Time    04/28/13  7:04 PM      Result Value Ref Range    Troponin I <0.30  <0.30 ng/mL   Comment:            Due to the release kinetics of cTnI,     a negative result within the first hours     of the onset of symptoms does not rule out     myocardial infarction with certainty.     If myocardial infarction is still suspected,     repeat the test at appropriate intervals.    Dg Chest 1 View  04/28/2013   CLINICAL DATA:  Recent traumatic injury with pain  EXAM: CHEST - 1 VIEW  COMPARISON:  03/06/2010  FINDINGS: The cardiac shadow is within normal limits. The lungs are well aerated bilaterally. Mild interstitial changes are identified. No acute bony abnormality is seen.  IMPRESSION: No acute abnormality noted.   Electronically Signed   By: [name] M.D.   On: 04/28/2013 16:47   Dg Elbow Complete Left  04/28/2013   CLINICAL DATA:  Traumatic injury with pain  EXAM: LEFT ELBOW - COMPLETE 3+ VIEW  COMPARISON:  None.  FINDINGS: There is no evidence of fracture, dislocation, or joint effusion. There is no evidence of arthropathy or other focal bone abnormality. Soft tissues are unremarkable.  IMPRESSION: No acute abnormality noted.   Electronically Signed   By: [name] M.D.   On: 04/28/2013 16:45   Dg Hip Complete Left  04/28/2013   CLINICAL DATA:  Traumatic injury with pain  EXAM: LEFT HIP - COMPLETE 2+ VIEW  COMPARISON:  None.  FINDINGS: Bilateral hip prostheses are seen. Degenerative changes of lumbar spine are noted. The pelvic ring is intact. There is irregularity in the medial aspect of the proximal femur suspicious for a intertrochanteric fracture. CT may be helpful for further evaluation.  IMPRESSION:  Possible left proximal femoral fracture. A CT of the hip is recommended for further evaluation.   Electronically Signed   By: [name] M.D.   On: 04/28/2013 16:47   Ct Head Wo Contrast  04/28/2013   CLINICAL DATA:  Recent traumatic injury and pain  EXAM: CT HEAD WITHOUT CONTRAST  TECHNIQUE: Contiguous axial images were obtained from the base  of the skull through the vertex without intravenous contrast.  COMPARISON:  11/21/2009  FINDINGS: The bony calvarium is intact. No gross soft tissue abnormality is noted. There is a calcified meningioma noted in the right occipital region stable from the prior exam. Mild atrophic changes are seen. No findings to suggest acute hemorrhage, acute infarction or space-occupying mass lesion are noted.  IMPRESSION: No acute abnormality seen.   Electronically Signed   By: [name] M.D.   On: 04/28/2013 17:36   Ct Hip Left Wo Contrast  04/28/2013   CLINICAL DATA:  Possible undisplaced fracture on recent plain film examination, recent trauma  EXAM: CT OF THE LEFT HIP WITHOUT CONTRAST  TECHNIQUE: Multidetector CT imaging was performed according to the standard protocol. Multiplanar CT image reconstructions were also generated.  COMPARISON:  Plain film from earlier in the same day.  FINDINGS: The left hip prosthesis is again identified and causes significant scatter artifact and some degradation of the images. There is however nondisplaced fracture surrounding the prosthesis just below the lesser trochanter moving in a spiral formation around the posterior aspect of the femur. This extends towards the midshaft of the femur but is again only mildly displaced. No other fractures are seen. Some changes are noted in the surrounding muscle bellies likely related to localized edema from the fracture. The prosthesis is well seated within the acetabulum. No other focal abnormality is noted.  IMPRESSION: Undisplaced fracture or surrounding the femoral prosthesis on the left just below the lesser trochanter and extending in is somewhat spiral fracture on the posterior aspect of the proximal femur.   Electronically Signed   By: [name] M.D.   On: 04/28/2013 17:40    Review of Systems  Constitutional: Negative.   HENT: Negative.   Eyes: Negative.   Respiratory: Negative.   Cardiovascular: Negative for chest pain.        A. Fib on Coumadin  Gastrointestinal: Positive for heartburn.  Genitourinary: Negative.   Musculoskeletal: Positive for falls and joint pain.  Skin: Negative.   Neurological: Negative.   Endo/Heme/Allergies: Negative.   Psychiatric/Behavioral: Negative.    Blood pressure 156/76, pulse 88, temperature 98.1 F (36.7 C), temperature source Oral, resp. rate 16, SpO2 94.00%. Physical Exam  Constitutional: She is oriented to person, place, and time. She appears well-nourished.  HENT:  Head: Atraumatic.  Eyes: EOM are normal.  Neck: Normal range of motion.  Cardiovascular: Normal rate and intact distal pulses.   Respiratory: Effort normal.  GI: Soft.  Genitourinary:  deferred  Musculoskeletal: She exhibits edema and tenderness.  Left hip  Neurological: She is alert and oriented to person, place, and time.  Skin: Skin is warm and dry.  Psychiatric: Her behavior is normal.    Assessment/Plan: Left hip periprosthetic nondisplaced fracture type B: NPO [place] traction LLE 5lbs. Type and screen  Hold coumadin and flecenide, heperin Drip. INR 1.6 Dr. Alvan Dame to see in am, for possible ORIF of the left hip. Patient and  Family question encouraged and answered.    Lizbet Cirrincione L Dena Esperanza 04/28/2013, 8:16 PM

## 2013-04-28 NOTE — H&P (Signed)
History and Physical       Hospital Admission Note Date: 04/28/2013  Patient name: Gloria [name] Medical record number: [id number] Date of birth: 1930/05/05 Age: 80 y.o. Gender: female  PCP: [name], MD    Chief Complaint:  Fall with left hip pain today  HPI: Patient is 80 year old female with history of hypertension, hyperlipidemia, GERD, atrial fibrillation on Coumadin and flecainide presented to the ER with mechanical fall. History was obtained from the patient who reported that she was working in her yard and then turned around to go into the house when she had a mechanical fall on her driveway. Patient states that she felt slightly dizzy on turning suddenly but did not have any syncopal episode, chest pain, palpitations or shortness of breath. CT of the left hip showed undisplaced fracture or surrounding the femoral prosthesis on the left just below the lesser trochanter and extending and somewhat spiral fracture of the posterior aspect of the proximal femur  Review of Systems:  Constitutional: Denies fever, chills, diaphoresis, poor appetite and + fatigue.  HEENT: Denies photophobia, eye pain, redness, hearing loss, ear pain, congestion, sore throat, rhinorrhea, sneezing, mouth sores, trouble swallowing, neck pain, neck stiffness and tinnitus.   Respiratory: Denies SOB, DOE, cough, chest tightness,  and wheezing.   Cardiovascular: Denies chest pain, palpitations and leg swelling.  Gastrointestinal: Denies nausea, vomiting, abdominal pain, diarrhea, constipation, blood in stool and abdominal distention.  Genitourinary: Denies dysuria, urgency, frequency, hematuria, flank pain and difficulty urinating.  Musculoskeletal please see history of present illness Skin: Denies pallor, rash and wound.  Neurological: Denies, seizures, syncope, weakness, numbness and headaches.  Hematological: Denies adenopathy. Easy bruising,  personal or family bleeding history  Psychiatric/Behavioral: Denies suicidal ideation, mood changes, confusion, nervousness, sleep disturbance and agitation  Past Medical History: Past Medical History  Diagnosis Date  . Hypertension   . Hyperlipidemia   . GERD (gastroesophageal reflux disease)     hx of  . Arthritis     right  . Atrial fibrillation   . Irritable bowel syndrome   . Meningioma   . Hepatitis C    Past Surgical History  Procedure Laterality Date  . Total hip arthroplasty  11/07    left  . Parotid gland tumor excision    . Total knee arthroplasty    . Transthoracic echocardiogram  10/25/2009    EF 55-60%  . Cardiovascular stress test  11/14/2006    EF 67%    Medications: Prior to Admission medications   Medication Sig Start Date End Date Taking? Authorizing Provider  Calcium Carb-Cholecalciferol (CALCIUM 600 + D PO) Take 1 tablet by mouth 2 (two) times daily.   Yes Historical Provider, MD  erythromycin (E-MYCIN) 250 MG tablet Take 1 tablet by mouth Twice daily. 05/15/10  Yes Historical Provider, MD  flecainide (TAMBOCOR) 50 MG tablet Take 1 tablet (50 mg total) by mouth 2 (two) times daily. 04/29/11  Yes Peter M Martinique, MD  hydrochlorothiazide (HYDRODIURIL) 25 MG tablet Take 1 tablet (25 mg total) by mouth daily. 03/03/13  Yes Peter M Martinique, MD  Multiple Vitamins-Minerals (ICAPS MV PO) Take 2 capsules by mouth daily.    Yes Historical Provider, MD  omeprazole (PRILOSEC OTC) 20 MG tablet Take 20 mg by mouth 2 (two) times daily.   Yes Historical Provider, MD  vitamin B-12 (CYANOCOBALAMIN) 1000 MCG tablet Take 1,000 mcg by mouth daily.   Yes Historical Provider, MD  warfarin (COUMADIN) 4 MG tablet Take 4-6 mg by mouth  daily. Takes 4 mg on Tuesday and Saturday and 6 mg all other days   Yes Historical Provider, MD    Allergies:   Allergies  Allergen Reactions  . Amoxicillin Other (See Comments)    unknown  . Cephalexin Other (See Comments)    unknown  . Codeine  Other (See Comments)    unknown  . Metoclopramide Hcl Other (See Comments)    unknown    Social History:  reports that she quit smoking about 32 years ago. Her smoking use included Cigarettes. She has a 4.5 pack-year smoking history. She has never used smokeless tobacco. She reports that she does not drink alcohol or use illicit drugs.  Family History: Family History  Problem Relation Age of Onset  . Heart disease Brother     Physical Exam: Blood pressure 162/60, pulse 90, temperature 98.1 F (36.7 C), temperature source Oral, resp. rate 19, SpO2 98.00%. General: Alert, awake, oriented x3, in no acute distress. HEENT: normocephalic, atraumatic, anicteric sclera, pink conjunctiva, pupils equal and reactive to light and accomodation, oropharynx clear Neck: supple, no masses or lymphadenopathy, no goiter, no bruits  Heart: Regular rate and rhythm, without murmurs, rubs or gallops, currently in sinus rhythm. Lungs: Clear to auscultation bilaterally, no wheezing, rales or rhonchi. Abdomen: Soft, nontender, nondistended, positive bowel sounds, no masses. Extremities: No clubbing, cyanosis or edema with positive pedal pulses, tenderness to palpation on the left hip, abrasion to left lateral elbow. Neuro: Grossly intact, no focal neurological deficits, strength 5/5 upper and lower extremities bilaterally, Psych: alert and oriented x 3, normal mood and affect Skin: no rashes or lesions, warm and dry   LABS on Admission:  Basic Metabolic Panel:  Recent Labs Lab 04/28/13 1529  NA 143  K 4.0  CL 104  CO2 23  GLUCOSE 129*  BUN 22  CREATININE 0.73  CALCIUM 9.5   Liver Function Tests:  Recent Labs Lab 04/28/13 1529  AST 23  ALT 16  ALKPHOS 120*  BILITOT 0.6  PROT 6.7  ALBUMIN 3.8   No results found for this basename: LIPASE, AMYLASE,  in the last 168 hours No results found for this basename: AMMONIA,  in the last 168 hours CBC:  Recent Labs Lab 04/28/13 1529  WBC 6.0   NEUTROABS 4.3  HGB 13.8  HCT 42.2  MCV 90.0  PLT 234   Cardiac Enzymes: No results found for this basename: CKTOTAL, CKMB, CKMBINDEX, TROPONINI,  in the last 168 hours BNP: No components found with this basename: POCBNP,  CBG: No results found for this basename: GLUCAP,  in the last 168 hours   Radiological Exams on Admission: Dg Chest 1 View  04/28/2013   CLINICAL DATA:  Recent traumatic injury with pain  EXAM: CHEST - 1 VIEW  COMPARISON:  03/06/2010  FINDINGS: The cardiac shadow is within normal limits. The lungs are well aerated bilaterally. Mild interstitial changes are identified. No acute bony abnormality is seen.  IMPRESSION: No acute abnormality noted.   Electronically Signed   By: [name] M.D.   On: 04/28/2013 16:47   Dg Elbow Complete Left  04/28/2013   CLINICAL DATA:  Traumatic injury with pain  EXAM: LEFT ELBOW - COMPLETE 3+ VIEW  COMPARISON:  None.  FINDINGS: There is no evidence of fracture, dislocation, or joint effusion. There is no evidence of arthropathy or other focal bone abnormality. Soft tissues are unremarkable.  IMPRESSION: No acute abnormality noted.   Electronically Signed   By: Linus Mako.D.  On: 04/28/2013 16:45   Dg Hip Complete Left  04/28/2013   CLINICAL DATA:  Traumatic injury with pain  EXAM: LEFT HIP - COMPLETE 2+ VIEW  COMPARISON:  None.  FINDINGS: Bilateral hip prostheses are seen. Degenerative changes of lumbar spine are noted. The pelvic ring is intact. There is irregularity in the medial aspect of the proximal femur suspicious for a intertrochanteric fracture. CT may be helpful for further evaluation.  IMPRESSION: Possible left proximal femoral fracture. A CT of the hip is recommended for further evaluation.   Electronically Signed   By: [name] M.D.   On: 04/28/2013 16:47   Ct Head Wo Contrast  04/28/2013   CLINICAL DATA:  Recent traumatic injury and pain  EXAM: CT HEAD WITHOUT CONTRAST  TECHNIQUE: Contiguous axial images were obtained  from the base of the skull through the vertex without intravenous contrast.  COMPARISON:  11/21/2009  FINDINGS: The bony calvarium is intact. No gross soft tissue abnormality is noted. There is a calcified meningioma noted in the right occipital region stable from the prior exam. Mild atrophic changes are seen. No findings to suggest acute hemorrhage, acute infarction or space-occupying mass lesion are noted.  IMPRESSION: No acute abnormality seen.   Electronically Signed   By: [name] M.D.   On: 04/28/2013 17:36   Ct Hip Left Wo Contrast  04/28/2013   CLINICAL DATA:  Possible undisplaced fracture on recent plain film examination, recent trauma  EXAM: CT OF THE LEFT HIP WITHOUT CONTRAST  TECHNIQUE: Multidetector CT imaging was performed according to the standard protocol. Multiplanar CT image reconstructions were also generated.  COMPARISON:  Plain film from earlier in the same day.  FINDINGS: The left hip prosthesis is again identified and causes significant scatter artifact and some degradation of the images. There is however nondisplaced fracture surrounding the prosthesis just below the lesser trochanter moving in a spiral formation around the posterior aspect of the femur. This extends towards the midshaft of the femur but is again only mildly displaced. No other fractures are seen. Some changes are noted in the surrounding muscle bellies likely related to localized edema from the fracture. The prosthesis is well seated within the acetabulum. No other focal abnormality is noted.  IMPRESSION: Undisplaced fracture or surrounding the femoral prosthesis on the left just below the lesser trochanter and extending in is somewhat spiral fracture on the posterior aspect of the proximal femur.   Electronically Signed   By: [name] M.D.   On: 04/28/2013 17:40    Assessment/Plan Principal Problem:   Closed left hip fracture - Will admit to telemetry, placed on hip fracture protocol, orthopedics has been  consulted by EDP - No plans of surgery tonight, will place patient on diet, pain control - patient has been on Coumadin, flecenide, INR 1.6. Will start patient on heparin drip, hold it prior to the surgery. Await orthopedics decision regarding the timing of the surgery.   Active Problems:   Hypertension: Currently stable will continue HCTZ    Atrial fibrillation on  Warfarin anticoagulation - Placed on heparin drip until the timing of the surgery is decided by orthopedics. Heparin drip will BE held prior to the surgery - Hold Coumadin, can be restarted after the surgery - Given patient had a slight dizziness on turning, I will rule out acute ACS. EKG shows prolonged QTC, otherwise no acute ST-T wave changes suggestive of ischemia  DVT prophylaxis: Heparin drip  CODE STATUS: Full code  Family Communication: Admission,  patients condition and plan of care including tests being ordered have been discussed with the patient and 2 daughters who indicates understanding and agree with the plan and Code Status   Further plan will depend as patient's clinical course evolves and further radiologic and laboratory data become available.   Time Spent on Admission: 1 hour  Ripudeep Krystal Eaton M.D. [facility] 04/28/2013, 7:03 PM Pager: [phone]  If 7PM-7AM, please contact night-coverage [email or web address] Password [email or web address]  **Disclaimer: This note was dictated with voice recognition software. Similar sounding words can inadvertently be transcribed and this note may contain transcription errors which may not have been corrected upon publication of note.**

## 2013-04-28 NOTE — ED Notes (Signed)
PTAR presents with a 80 yo female from home that fell in yard at home while performing yard duties.  Pt states she was in the yard gardening when she turned to give son a key to house when pt got dizzy and fell.  Pt hit left hip and left elbow with abrasion on left elbow.  Pt has pain at 8 out of 10 to left leg/hip area.

## 2013-04-28 NOTE — ED Provider Notes (Signed)
CSN: [id number]     Arrival date & time 04/28/13  1457 History   First MD Initiated Contact with Patient 04/28/13 1505     Chief Complaint  Patient presents with  . Fall     (Consider location/radiation/quality/duration/timing/severity/associated sxs/prior Treatment) Patient is a 80 y.o. female presenting with fall. The history is provided by the patient.  Fall This is a new problem. The current episode started 1 to 2 hours ago. Episode frequency: once. The problem has been resolved. Pertinent negatives include no chest pain, no abdominal pain, no headaches and no shortness of breath. Nothing aggravates the symptoms. Nothing relieves the symptoms. She has tried nothing for the symptoms. The treatment provided no relief.    Past Medical History  Diagnosis Date  . Hypertension   . Hyperlipidemia   . GERD (gastroesophageal reflux disease)     hx of  . Arthritis     right  . Atrial fibrillation   . Irritable bowel syndrome   . Meningioma   . Hepatitis C    Past Surgical History  Procedure Laterality Date  . Total hip arthroplasty  11/07    left  . Parotid gland tumor excision    . Total knee arthroplasty    . Transthoracic echocardiogram  10/25/2009    EF 55-60%  . Cardiovascular stress test  11/14/2006    EF 67%   Family History  Problem Relation Age of Onset  . Heart disease Brother    History  Substance Use Topics  . Smoking status: Former Smoker -- 0.30 packs/day for 15 years    Types: Cigarettes    Quit date: 06/07/1980  . Smokeless tobacco: Never Used  . Alcohol Use: No   OB History   Grav Para Term Preterm Abortions TAB SAB Ect Mult Living                 Review of Systems  Constitutional: Negative for fever and fatigue.  HENT: Negative for congestion and drooling.   Eyes: Negative for pain.  Respiratory: Negative for cough and shortness of breath.   Cardiovascular: Negative for chest pain.  Gastrointestinal: Negative for nausea, vomiting, abdominal  pain and diarrhea.  Genitourinary: Negative for dysuria and hematuria.  Musculoskeletal: Negative for back pain, gait problem and neck pain.  Skin: Negative for color change.  Neurological: Negative for dizziness and headaches.  Hematological: Negative for adenopathy.  Psychiatric/Behavioral: Negative for behavioral problems.  All other systems reviewed and are negative.     Allergies  Amoxicillin; Cephalexin; Codeine; and Metoclopramide hcl  Home Medications   Prior to Admission medications   Medication Sig Start Date End Date Taking? Authorizing Provider  calcium carbonate (OS-CAL) 600 MG TABS Take 600 mg by mouth 2 (two) times daily with a meal.      Historical Provider, MD  Cholecalciferol (VITAMIN D PO) Take by mouth.    Historical Provider, MD  erythromycin (E-MYCIN) 250 MG tablet Take 1 tablet by mouth Twice daily. 05/15/10   Historical Provider, MD  flecainide (TAMBOCOR) 50 MG tablet Take 1 tablet (50 mg total) by mouth 2 (two) times daily. 04/29/11   Peter M Martinique, MD  hydrochlorothiazide (HYDRODIURIL) 25 MG tablet Take 1 tablet (25 mg total) by mouth daily. 03/03/13   Peter M Martinique, MD  Multiple Vitamins-Minerals (ICAPS MV PO) Take by mouth daily.    Historical Provider, MD  omeprazole (PRILOSEC) 40 MG capsule Take 40 mg by mouth 2 (two) times daily.  Historical Provider, MD  vitamin B-12 (CYANOCOBALAMIN) 1000 MCG tablet Take 1,000 mcg by mouth daily.    Historical Provider, MD  warfarin (COUMADIN) 4 MG tablet TAKE AS DIRECTED BY  COUMADIN  CLINIC    Peter M Martinique, MD   BP 154/68  Pulse 67  Temp(Src) 98.1 F (36.7 C) (Oral)  Resp 18  SpO2 100% Physical Exam  Nursing note and vitals reviewed. Constitutional: She is oriented to person, place, and time. She appears well-developed and well-nourished.  HENT:  Head: Normocephalic.  Mouth/Throat: Oropharynx is clear and moist. No oropharyngeal exudate.  Eyes: Conjunctivae and EOM are normal. Pupils are equal, round, and  reactive to light.  Neck: Normal range of motion. Neck supple.  No vertebral tenderness to palpation.  Cardiovascular: Normal rate, regular rhythm, normal heart sounds and intact distal pulses.  Exam reveals no gallop and no friction rub.   No murmur heard. Pulmonary/Chest: Effort normal and breath sounds normal. No respiratory distress. She has no wheezes. She exhibits no tenderness.  Abdominal: Soft. Bowel sounds are normal. There is no tenderness. There is no rebound and no guarding.  Musculoskeletal: Normal range of motion. She exhibits tenderness (mild tenderness to palpation of the left lateral hip.). She exhibits no edema.  Normal range of motion of the right hip without pain.  2+ distal pulses in the lower extremities.  Normal sensation in the lower extremities.  Abrasion to left lateral elbow.    Neurological: She is alert and oriented to person, place, and time.  Skin: Skin is warm and dry.  Psychiatric: She has a normal mood and affect. Her behavior is normal.    ED Course  Procedures (including critical care time) Labs Review Labs Reviewed  COMPREHENSIVE METABOLIC PANEL - Abnormal; Notable for the following:    Glucose, Bld 129 (*)    Alkaline Phosphatase 120 (*)    GFR calc non Af Amer 79 (*)    All other components within normal limits  PROTIME-INR - Abnormal; Notable for the following:    Prothrombin Time 19.0 (*)    INR 1.64 (*)    All other components within normal limits  URINALYSIS, ROUTINE W REFLEX MICROSCOPIC - Abnormal; Notable for the following:    Ketones, ur 15 (*)    All other components within normal limits  HEPARIN LEVEL (UNFRACTIONATED) - Abnormal; Notable for the following:    Heparin Unfractionated 0.16 (*)    All other components within normal limits  CBC - Abnormal; Notable for the following:    Hemoglobin 11.8 (*)    All other components within normal limits  BASIC METABOLIC PANEL - Abnormal; Notable for the following:    GFR calc non Af  Amer 80 (*)    All other components within normal limits  MRSA PCR SCREENING  URINE CULTURE  CBC WITH DIFFERENTIAL  TROPONIN I  TROPONIN I  TROPONIN I  VITAMIN D 25 HYDROXY  HEPARIN LEVEL (UNFRACTIONATED)  URINALYSIS, ROUTINE W REFLEX MICROSCOPIC  HEPARIN LEVEL (UNFRACTIONATED)  CBC  TYPE AND SCREEN    Imaging Review Dg Chest 1 View  04/28/2013   CLINICAL DATA:  Recent traumatic injury with pain  EXAM: CHEST - 1 VIEW  COMPARISON:  03/06/2010  FINDINGS: The cardiac shadow is within normal limits. The lungs are well aerated bilaterally. Mild interstitial changes are identified. No acute bony abnormality is seen.  IMPRESSION: No acute abnormality noted.   Electronically Signed   By: [name] M.D.   On: 04/28/2013 16:47  Dg Elbow Complete Left  04/28/2013   CLINICAL DATA:  Traumatic injury with pain  EXAM: LEFT ELBOW - COMPLETE 3+ VIEW  COMPARISON:  None.  FINDINGS: There is no evidence of fracture, dislocation, or joint effusion. There is no evidence of arthropathy or other focal bone abnormality. Soft tissues are unremarkable.  IMPRESSION: No acute abnormality noted.   Electronically Signed   By: [name] M.D.   On: 04/28/2013 16:45   Dg Hip Complete Left  04/28/2013   CLINICAL DATA:  Traumatic injury with pain  EXAM: LEFT HIP - COMPLETE 2+ VIEW  COMPARISON:  None.  FINDINGS: Bilateral hip prostheses are seen. Degenerative changes of lumbar spine are noted. The pelvic ring is intact. There is irregularity in the medial aspect of the proximal femur suspicious for a intertrochanteric fracture. CT may be helpful for further evaluation.  IMPRESSION: Possible left proximal femoral fracture. A CT of the hip is recommended for further evaluation.   Electronically Signed   By: [name] M.D.   On: 04/28/2013 16:47   Ct Head Wo Contrast  04/28/2013   CLINICAL DATA:  Recent traumatic injury and pain  EXAM: CT HEAD WITHOUT CONTRAST  TECHNIQUE: Contiguous axial images were obtained from the  base of the skull through the vertex without intravenous contrast.  COMPARISON:  11/21/2009  FINDINGS: The bony calvarium is intact. No gross soft tissue abnormality is noted. There is a calcified meningioma noted in the right occipital region stable from the prior exam. Mild atrophic changes are seen. No findings to suggest acute hemorrhage, acute infarction or space-occupying mass lesion are noted.  IMPRESSION: No acute abnormality seen.   Electronically Signed   By: [name] M.D.   On: 04/28/2013 17:36   Ct Hip Left Wo Contrast  04/28/2013   CLINICAL DATA:  Possible undisplaced fracture on recent plain film examination, recent trauma  EXAM: CT OF THE LEFT HIP WITHOUT CONTRAST  TECHNIQUE: Multidetector CT imaging was performed according to the standard protocol. Multiplanar CT image reconstructions were also generated.  COMPARISON:  Plain film from earlier in the same day.  FINDINGS: The left hip prosthesis is again identified and causes significant scatter artifact and some degradation of the images. There is however nondisplaced fracture surrounding the prosthesis just below the lesser trochanter moving in a spiral formation around the posterior aspect of the femur. This extends towards the midshaft of the femur but is again only mildly displaced. No other fractures are seen. Some changes are noted in the surrounding muscle bellies likely related to localized edema from the fracture. The prosthesis is well seated within the acetabulum. No other focal abnormality is noted.  IMPRESSION: Undisplaced fracture or surrounding the femoral prosthesis on the left just below the lesser trochanter and extending in is somewhat spiral fracture on the posterior aspect of the proximal femur.   Electronically Signed   By: [name] M.D.   On: 04/28/2013 17:40     EKG Interpretation   Date/Time:  Wednesday April 28 2013 16:45:59 EDT Ventricular Rate:  81 PR Interval:  263 QRS Duration: 102 QT Interval:   440 QTC Calculation: 511 R Axis:   -31 Text Interpretation:  Sinus rhythm Prolonged PR interval Abnormal R-wave  progression, late transition Left ventricular hypertrophy Prolonged QT  interval Confirmed by Aline Brochure  MD, Kallan Bischoff ([id number]) on 04/28/2013 4:56:12 PM      MDM   Final diagnoses:  Fall  Closed left hip fracture    3:26 PM 80  y.o. female w a hx of afib on coumadin who presents with a fall which occurred around 2:15 PM this afternoon. The patient was outside doing yardwork when she turned around quickly to go back in her house she fell onto her left hip. Suspect mechanical cause. She denies syncope. She denies hitting her head or loss of consciousness. She is afebrile and vital signs are unremarkable here. She is complaining of left hip pain. Will get screening labs and imaging. Fentanyl for pain control.  Found to have left hip fx. Discussed w/ [facility]. Will admit to hospitalist.     Blanchard Kelch, MD 04/29/13 2026

## 2013-04-28 NOTE — Progress Notes (Signed)
ANTICOAGULATION CONSULT NOTE - Initial Consult  Pharmacy Consult for Heparin Indication: atrial fibrillation  Allergies  Allergen Reactions  . Amoxicillin Other (See Comments)    unknown  . Cephalexin Other (See Comments)    unknown  . Codeine Other (See Comments)    unknown  . Metoclopramide Hcl Other (See Comments)    unknown    Patient Measurements:   Heparin Dosing Weight: 66 kg   Vital Signs: Temp: 98.1 F (36.7 C) (04/22 1514) Temp src: Oral (04/22 1514) BP: 162/60 mmHg (04/22 1848) Pulse Rate: 90 (04/22 1848)  Labs:  Recent Labs  04/28/13 1529  HGB 13.8  HCT 42.2  PLT 234  LABPROT 19.0*  INR 1.64*  CREATININE 0.73    The CrCl is unknown because both a height and weight (above a minimum accepted value) are required for this calculation.   Medical History: Past Medical History  Diagnosis Date  . Hypertension   . Hyperlipidemia   . GERD (gastroesophageal reflux disease)     hx of  . Arthritis     right  . Atrial fibrillation   . Irritable bowel syndrome   . Meningioma   . Hepatitis C     Medications:   (Not in a hospital admission)  Assessment: 31 YOF brought to the ED after sustaining a fall in the yard at home. She has a history of Afib on coumadin at home with INR of 1.64 on admission. Pharmacy to start heparin drip. Hgb and Plt wnl.   Goal of Therapy:  Heparin level 0.3-0.7 units/ml Monitor platelets by anticoagulation protocol: Yes   Plan:  Start heparin infusion at 900 units/hr. No bolus  Check anti-Xa level in 8 hours and daily while on heparin Continue to monitor H&H and platelets  Albertina Parr, PharmD.  Clinical Pharmacist Pager [phone]

## 2013-04-29 ENCOUNTER — Encounter (HOSPITAL_COMMUNITY): Payer: Self-pay | Admitting: Anesthesiology

## 2013-04-29 ENCOUNTER — Encounter (HOSPITAL_COMMUNITY)
Admission: EM | Disposition: A | Discharge status: Skilled Nursing Facility | Payer: Self-pay | Source: Home / Self Care | Attending: Internal Medicine

## 2013-04-29 DIAGNOSIS — I10 Essential (primary) hypertension: Secondary | ICD-10-CM

## 2013-04-29 DIAGNOSIS — Z7901 Long term (current) use of anticoagulants: Secondary | ICD-10-CM

## 2013-04-29 LAB — CBC
HCT: 36 % (ref 36.0–46.0)
Hemoglobin: 11.8 g/dL — ABNORMAL LOW (ref 12.0–15.0)
MCH: 29.1 pg (ref 26.0–34.0)
MCHC: 32.8 g/dL (ref 30.0–36.0)
MCV: 88.9 fL (ref 78.0–100.0)
Platelets: 202 10*3/uL (ref 150–400)
RBC: 4.05 MIL/uL (ref 3.87–5.11)
RDW: 14.3 % (ref 11.5–15.5)
WBC: 7.1 10*3/uL (ref 4.0–10.5)

## 2013-04-29 LAB — HEPARIN LEVEL (UNFRACTIONATED)
Heparin Unfractionated: 0.16 IU/mL — ABNORMAL LOW (ref 0.30–0.70)
Heparin Unfractionated: 0.34 IU/mL (ref 0.30–0.70)

## 2013-04-29 LAB — BASIC METABOLIC PANEL
BUN: 16 mg/dL (ref 6–23)
CO2: 22 mEq/L (ref 19–32)
Calcium: 8.5 mg/dL (ref 8.4–10.5)
Chloride: 106 mEq/L (ref 96–112)
Creatinine, Ser: 0.7 mg/dL (ref 0.50–1.10)
GFR calc Af Amer: >90 mL/min (ref >90)
GFR calc non Af Amer: 80 mL/min — ABNORMAL LOW (ref >90)
Glucose, Bld: 89 mg/dL (ref 70–99)
Potassium: 3.9 mEq/L (ref 3.7–5.3)
Sodium: 141 mEq/L (ref 137–147)

## 2013-04-29 LAB — TROPONIN I
Troponin I: <0.3 ng/mL (ref <0.30)
Troponin I: <0.3 ng/mL (ref <0.30)

## 2013-04-29 LAB — MRSA PCR SCREENING: MRSA by PCR: NEGATIVE

## 2013-04-29 LAB — VITAMIN D 25 HYDROXY (VIT D DEFICIENCY, FRACTURES): Vit D, 25-Hydroxy: 45 ng/mL (ref 30–89)

## 2013-04-29 SURGERY — OPEN REDUCTION INTERNAL FIXATION (ORIF) PERIPROSTHETIC FRACTURE
Anesthesia: General

## 2013-04-29 MED ORDER — MIDAZOLAM HCL 2 MG/2ML IJ SOLN
INTRAMUSCULAR | Status: AC
  Filled 2013-04-29: qty 2

## 2013-04-29 MED ORDER — FENTANYL CITRATE 0.05 MG/ML IJ SOLN
INTRAMUSCULAR | Status: AC
  Filled 2013-04-29: qty 5

## 2013-04-29 MED ORDER — ONDANSETRON HCL 4 MG/2ML IJ SOLN
INTRAMUSCULAR | Status: AC
  Administered 2013-04-29: 4 mg
  Filled 2013-04-29: qty 2

## 2013-04-29 MED ORDER — ONDANSETRON HCL 4 MG/2ML IJ SOLN: 4.0000 mg | Freq: Four times a day (QID) | INTRAMUSCULAR | Status: DC | PRN

## 2013-04-29 SURGICAL SUPPLY — 33 items
BRUSH FEMORAL CANAL (MISCELLANEOUS) IMPLANT
COVER BACK TABLE 24X17X13 BIG (DRAPES) IMPLANT
DECANTER SPIKE VIAL GLASS SM (MISCELLANEOUS) ×3 IMPLANT
DRAPE INCISE IOBAN 66X45 STRL (DRAPES) ×3 IMPLANT
DRAPE ORTHO SPLIT 77X108 STRL (DRAPES) ×4
DRAPE POUCH INSTRU U-SHP 10X18 (DRAPES) ×3 IMPLANT
DRAPE SURG ORHT 6 SPLT 77X108 (DRAPES) ×4 IMPLANT
DRSG PAD ABDOMINAL 8X10 ST (GAUZE/BANDAGES/DRESSINGS) ×3 IMPLANT
DURAPREP 26ML APPLICATOR (WOUND CARE) ×3 IMPLANT
ELECT CAUTERY BLADE 6.4 (BLADE) ×3 IMPLANT
ELECT REM PT RETURN 9FT ADLT (ELECTROSURGICAL) ×2
ELECTRODE REM PT RTRN 9FT ADLT (ELECTROSURGICAL) ×2 IMPLANT
FILTER STRAW FLUID ASPIR (MISCELLANEOUS) ×3 IMPLANT
GLOVE BIO SURGEON STRL SZ8.5 (GLOVE) ×3 IMPLANT
GOWN STRL REUS W/ TWL LRG LVL3 (GOWN DISPOSABLE) ×6 IMPLANT
GOWN STRL REUS W/TWL LRG LVL3 (GOWN DISPOSABLE) ×6
HANDPIECE INTERPULSE COAX TIP (DISPOSABLE)
IMMOBILIZER KNEE 22 UNIV (SOFTGOODS) ×3 IMPLANT
KIT BASIN OR (CUSTOM PROCEDURE TRAY) ×3 IMPLANT
KIT ROOM TURNOVER OR (KITS) ×3 IMPLANT
MANIFOLD NEPTUNE II (INSTRUMENTS) ×3 IMPLANT
NS IRRIG 1000ML POUR BTL (IV SOLUTION) ×6 IMPLANT
PACK TOTAL JOINT (CUSTOM PROCEDURE TRAY) ×3 IMPLANT
PAD ARMBOARD 7.5X6 YLW CONV (MISCELLANEOUS) ×6 IMPLANT
PRESSURIZER FEMORAL UNIV (MISCELLANEOUS) IMPLANT
SET HNDPC FAN SPRY TIP SCT (DISPOSABLE) IMPLANT
SPONGE GAUZE 4X4 12PLY (GAUZE/BANDAGES/DRESSINGS) ×3 IMPLANT
SPONGE LAP 4X18 X RAY DECT (DISPOSABLE) ×3 IMPLANT
TOWEL OR 17X24 6PK STRL BLUE (TOWEL DISPOSABLE) ×3 IMPLANT
TOWEL OR 17X26 10 PK STRL BLUE (TOWEL DISPOSABLE) ×3 IMPLANT
TOWER CARTRIDGE SMART MIX (DISPOSABLE) IMPLANT
TRAY FOLEY CATH 16FRSI W/METER (SET/KITS/TRAYS/PACK) ×3 IMPLANT
WATER STERILE IRR 1000ML POUR (IV SOLUTION) ×6 IMPLANT

## 2013-04-29 NOTE — Care Management Note (Signed)
    Page 1 of 1   05/04/2013     4:55:39 PM CARE MANAGEMENT NOTE 05/04/2013  Patient:  Gloria [name], Gloria [name]   Account Number:  [id number]  Date Initiated:  04/29/2013  Documentation initiated by:  Illiana Losurdo  Subjective/Objective Assessment:   Pt s/p fall with Lt hip fracture.  Pt to OR today for repair.  PTA, pt resided at home and was independent.     Action/Plan:   Will consult CSW to follow post op for likely SNF placement for rehab.   Anticipated DC Date:  05/03/2013   Anticipated DC Plan:  SKILLED NURSING FACILITY  In-house referral  Clinical Social Worker      DC Planning Services  CM consult      Choice offered to / List presented to:             Status of service:  Completed, signed off Medicare Important Message given?  NO (If response is "NO", the following Medicare IM given date fields will be blank) Date Medicare IM given:   Date Additional Medicare IM given:    Discharge Disposition:  [place]  Per UR Regulation:  Reviewed for med. necessity/level of care/duration of stay  If discussed at [place] of Stay Meetings, dates discussed:    Comments:  05/04/13 Ellan Lambert, RN, BSN [phone] Pt discharged to SNF today, per CSW arrangements.

## 2013-04-29 NOTE — Consult Note (Signed)
Patient seen and examined this am. Reviewed fracture with her.   Given the distal extent of the fracture I feel it is her best interest to have it internally stabilized with cables.  Her stem appears to be stable.  Anticipate PWB status on her left leg post-operatively NPO after 9am Consent ordered

## 2013-04-29 NOTE — Progress Notes (Signed)
[facility] PROGRESS NOTE  Gloria [name] ZDG:[id number] DOB: [birth year] DOA: 04/28/2013 PCP: [name], MD  Assessment/Plan: 1. Left proximal femoral fracture -Status post mechanical fall at home. -CT scan showing undisplaced fracture surrounding the femoral prosthesis on the left just below the lesser trochanter  -Orthopedic surgery consulted plan for ORIF today  2. Atrial fibrillation -Remains rate controlled -Continue flecainide 50 mg by mouth twice a day -Had been on Coumadin therapy  3. Anticoagulation -Coumadin reversed for procedure, covered with IV heparin  4. Hypertension -Hydrochlorothiazide held -Blood pressure stable   Code Status: Full code  Family Communication:  Disposition Plan: Plan for ORIF today   Consultants:  Orthopedic surgery  Procedures:  Plan for ORIF on 04/29/2013  Antibiotics:    HPI/Subjective: Patient is a pleasant 80 year old female present residing in the community, was admitted to the medicine service on 04/28/2013 presenting to the emergency room after having a fall on her driveway. She states that she was doing yardwork, turned around lost her balance and fell. Imaging studies on presentation showed left proximal femoral fracture. History consulted, plan for ORIF later today  Objective: Filed Vitals:   04/29/13 1327  BP: 116/51  Pulse: 58  Temp: 97.8 F (36.6 C)  Resp: 18    Intake/Output Summary (Last 24 hours) at 04/29/13 1425 Last data filed at 04/29/13 1327  Gross per 24 hour  Intake    100 ml  Output    640 ml  Net   -540 ml   Filed Weights   04/28/13 2024  Weight: 69.1 kg (152 lb 5.4 oz)    Exam:   General:  Patient is in no acute distress awake alert   Cardiovascular: Regular rate and rhythm normal S1-S2   Respiratory: Clear to auscultation bilaterally no wheezing rhonchi or rales   Abdomen: Soft nontender nondistended   Musculoskeletal: Traction device in place, patient having pain  to left lower extremity with passive and active movement  Data Reviewed: Basic Metabolic Panel:  Recent Labs Lab 04/28/13 1529 04/29/13 0420  NA 143 141  K 4.0 3.9  CL 104 106  CO2 23 22  GLUCOSE 129* 89  BUN 22 16  CREATININE 0.73 0.70  CALCIUM 9.5 8.5   Liver Function Tests:  Recent Labs Lab 04/28/13 1529  AST 23  ALT 16  ALKPHOS 120*  BILITOT 0.6  PROT 6.7  ALBUMIN 3.8   No results found for this basename: LIPASE, AMYLASE,  in the last 168 hours No results found for this basename: AMMONIA,  in the last 168 hours CBC:  Recent Labs Lab 04/28/13 1529 04/29/13 0420  WBC 6.0 7.1  NEUTROABS 4.3  --   HGB 13.8 11.8*  HCT 42.2 36.0  MCV 90.0 88.9  PLT 234 202   Cardiac Enzymes:  Recent Labs Lab 04/28/13 1904 04/29/13 0038 04/29/13 0645  TROPONINI <0.30 <0.30 <0.30   BNP (last 3 results) No results found for this basename: PROBNP,  in the last 8760 hours CBG: No results found for this basename: GLUCAP,  in the last 168 hours  Recent Results (from the past 240 hour(s))  MRSA PCR SCREENING     Status: None   Collection Time    04/29/13  5:38 AM      Result Value Ref Range Status   MRSA by PCR NEGATIVE  NEGATIVE Final   Comment:            The GeneXpert MRSA Assay (FDA     approved for  NASAL specimens     only), is one component of a     comprehensive MRSA colonization     surveillance program. It is not     intended to diagnose MRSA     infection nor to guide or     monitor treatment for     MRSA infections.     Studies: Dg Chest 1 View  04/28/2013   CLINICAL DATA:  Recent traumatic injury with pain  EXAM: CHEST - 1 VIEW  COMPARISON:  03/06/2010  FINDINGS: The cardiac shadow is within normal limits. The lungs are well aerated bilaterally. Mild interstitial changes are identified. No acute bony abnormality is seen.  IMPRESSION: No acute abnormality noted.   Electronically Signed   By: [name] M.D.   On: 04/28/2013 16:47   Dg Elbow Complete  Left  04/28/2013   CLINICAL DATA:  Traumatic injury with pain  EXAM: LEFT ELBOW - COMPLETE 3+ VIEW  COMPARISON:  None.  FINDINGS: There is no evidence of fracture, dislocation, or joint effusion. There is no evidence of arthropathy or other focal bone abnormality. Soft tissues are unremarkable.  IMPRESSION: No acute abnormality noted.   Electronically Signed   By: [name] M.D.   On: 04/28/2013 16:45   Dg Hip Complete Left  04/28/2013   CLINICAL DATA:  Traumatic injury with pain  EXAM: LEFT HIP - COMPLETE 2+ VIEW  COMPARISON:  None.  FINDINGS: Bilateral hip prostheses are seen. Degenerative changes of lumbar spine are noted. The pelvic ring is intact. There is irregularity in the medial aspect of the proximal femur suspicious for a intertrochanteric fracture. CT may be helpful for further evaluation.  IMPRESSION: Possible left proximal femoral fracture. A CT of the hip is recommended for further evaluation.   Electronically Signed   By: [name] M.D.   On: 04/28/2013 16:47   Ct Head Wo Contrast  04/28/2013   CLINICAL DATA:  Recent traumatic injury and pain  EXAM: CT HEAD WITHOUT CONTRAST  TECHNIQUE: Contiguous axial images were obtained from the base of the skull through the vertex without intravenous contrast.  COMPARISON:  11/21/2009  FINDINGS: The bony calvarium is intact. No gross soft tissue abnormality is noted. There is a calcified meningioma noted in the right occipital region stable from the prior exam. Mild atrophic changes are seen. No findings to suggest acute hemorrhage, acute infarction or space-occupying mass lesion are noted.  IMPRESSION: No acute abnormality seen.   Electronically Signed   By: [name] M.D.   On: 04/28/2013 17:36   Ct Hip Left Wo Contrast  04/28/2013   CLINICAL DATA:  Possible undisplaced fracture on recent plain film examination, recent trauma  EXAM: CT OF THE LEFT HIP WITHOUT CONTRAST  TECHNIQUE: Multidetector CT imaging was performed according to the  standard protocol. Multiplanar CT image reconstructions were also generated.  COMPARISON:  Plain film from earlier in the same day.  FINDINGS: The left hip prosthesis is again identified and causes significant scatter artifact and some degradation of the images. There is however nondisplaced fracture surrounding the prosthesis just below the lesser trochanter moving in a spiral formation around the posterior aspect of the femur. This extends towards the midshaft of the femur but is again only mildly displaced. No other fractures are seen. Some changes are noted in the surrounding muscle bellies likely related to localized edema from the fracture. The prosthesis is well seated within the acetabulum. No other focal abnormality is noted.  IMPRESSION: Undisplaced  fracture or surrounding the femoral prosthesis on the left just below the lesser trochanter and extending in is somewhat spiral fracture on the posterior aspect of the proximal femur.   Electronically Signed   By: [name] M.D.   On: 04/28/2013 17:40    Scheduled Meds: . flecainide  50 mg Oral BID  . hydrochlorothiazide  25 mg Oral Daily  . pantoprazole  40 mg Oral BID AC  . vitamin B-12  1,000 mcg Oral Daily   Continuous Infusions: . heparin 1,100 Units/hr (04/29/13 0645)    Principal Problem:   Closed left hip fracture Active Problems:   Hypertension   Hyperlipidemia   Atrial fibrillation   Warfarin anticoagulation   Hip fracture    Time spent: 35 minutes    [facility] Pager [phone]. If 7PM-7AM, please contact night-coverage at [email or web address], password [facility] 04/29/2013, 2:25 PM  LOS: 1 day

## 2013-04-29 NOTE — Progress Notes (Signed)
ANTICOAGULATION CONSULT NOTE - Follow Up Consult  Pharmacy Consult:  Heparin Indication: atrial fibrillation  Allergies  Allergen Reactions  . Amoxicillin Other (See Comments)    unknown  . Cephalexin Other (See Comments)    unknown  . Codeine Other (See Comments)    unknown  . Metoclopramide Hcl Other (See Comments)    unknown    Patient Measurements: Height: 5\' 5"  (165.1 cm) Weight: 152 lb 5.4 oz (69.1 kg) IBW/kg (Calculated) : 57 Heparin Dosing Weight: 66 kg  Vital Signs: Temp: 97.8 F (36.6 C) (04/23 1327) Temp src: Oral (04/23 1327) BP: 116/51 mmHg (04/23 1327) Pulse Rate: 58 (04/23 1327)  Labs:  Recent Labs  04/28/13 1529 04/28/13 1904 04/29/13 0038 04/29/13 0420 04/29/13 0645 04/29/13 1307  HGB 13.8  --   --  11.8*  --   --   HCT 42.2  --   --  36.0  --   --   PLT 234  --   --  202  --   --   LABPROT 19.0*  --   --   --   --   --   INR 1.64*  --   --   --   --   --   HEPARINUNFRC  --   --   --  0.16*  --  0.34  CREATININE 0.73  --   --  0.70  --   --   TROPONINI  --  <0.30 <0.30  --  <0.30  --     Estimated Creatinine Clearance: 54.7 ml/min (by C-G formula based on Cr of 0.7).     Assessment: 16 YOF on Coumadin PTA for history of Afib admitted after sustaining a fall.  Patient was transitioned to IV heparin as bridge therapy for surgery.  Heparin level therapeutic.  No bleeding reported.     Goal of Therapy:  Heparin level 0.3-0.7 units/ml Monitor platelets by anticoagulation protocol: Yes    Plan:  - Continue heparin gtt at 1100 units/hr - F/U post OR    Samaiyah Howes D. Mina Marble, PharmD, BCPS Pager:  [phone] 04/29/2013, 3:01 PM

## 2013-04-29 NOTE — Progress Notes (Signed)
[place] for Heparin Indication: atrial fibrillation  Allergies  Allergen Reactions  . Amoxicillin Other (See Comments)    unknown  . Cephalexin Other (See Comments)    unknown  . Codeine Other (See Comments)    unknown  . Metoclopramide Hcl Other (See Comments)    unknown    Patient Measurements: Height: 5\' 5"  (165.1 cm) Weight: 152 lb 5.4 oz (69.1 kg) IBW/kg (Calculated) : 57 Heparin Dosing Weight: 66 kg   Vital Signs: Temp: 97.7 F (36.5 C) (04/23 0426) Temp src: Oral (04/23 0426) BP: 115/54 mmHg (04/23 0426) Pulse Rate: 78 (04/23 0426)  Labs:  Recent Labs  04/28/13 1529 04/28/13 1904 04/29/13 0038 04/29/13 0420  HGB 13.8  --   --  11.8*  HCT 42.2  --   --  36.0  PLT 234  --   --  202  LABPROT 19.0*  --   --   --   INR 1.64*  --   --   --   HEPARINUNFRC  --   --   --  0.16*  CREATININE 0.73  --   --  0.70  TROPONINI  --  <0.30 <0.30  --     Estimated Creatinine Clearance: 54.7 ml/min (by C-G formula based on Cr of 0.7).  Assessment: 80 yo female with h/o Afib, Coumadin on hold, for heparin  Goal of Therapy:  Heparin level 0.3-0.7 units/ml Monitor platelets by anticoagulation protocol: Yes   Plan:  Increase Heparin 1100 units/hr F/U after OR  Phillis Knack, PharmD, BCPS

## 2013-04-29 NOTE — Anesthesia Preprocedure Evaluation (Addendum)
Anesthesia Evaluation  Patient identified by MRN, date of birth, ID band  Reviewed: Allergy & Precautions, H&P , NPO status , Patient's Chart, lab work & pertinent test results, reviewed documented beta blocker date and time   Airway Mallampati: II  Neck ROM: full    Dental  (+) Dental Advisory Given   Pulmonary shortness of breath and with exertion, former smoker,  breath sounds clear to auscultation        Cardiovascular hypertension, Pt. on medications + dysrhythmias Atrial Fibrillation Rhythm:Irregular     Neuro/Psych    GI/Hepatic GERD-  Medicated and Controlled,(+) Hepatitis -, C  Endo/Other    Renal/GU      Musculoskeletal   Abdominal (+)  Abdomen: soft. Bowel sounds: normal.  Peds  Hematology   Anesthesia Other Findings Heparin gtt stopped at 1720.  Waldron Session, CRNA  Reproductive/Obstetrics                      Anesthesia Physical Anesthesia Plan  ASA: III  Anesthesia Plan: General   Post-op Pain Management:    Induction: Intravenous  Airway Management Planned: Oral ETT  Additional Equipment:   Intra-op Plan:   Post-operative Plan: Extubation in OR and Possible Post-op intubation/ventilation  Informed Consent: I have reviewed the patients History and Physical, chart, labs and discussed the procedure including the risks, benefits and alternatives for the proposed anesthesia with the patient or authorized representative who has indicated his/her understanding and acceptance.     Plan Discussed with: CRNA, Anesthesiologist and Surgeon  Anesthesia Plan Comments:        Anesthesia Quick Evaluation

## 2013-04-29 NOTE — Brief Op Note (Signed)
04/28/2013 - 04/29/2013  7:48 PM  PATIENT:  Gloria [name]  80 y.o. female  PRE-OPERATIVE DIAGNOSIS:  LEFT PERIPROSTHETIC Proximal FEMUR FRACTURE  POST-OPERATIVE DIAGNOSIS:  LEFT PERIPROSTHETIC Proximal FEMUR FRACTURE  PROCEDURE:  Procedure(s): OPEN REDUCTION INTERNAL FIXATION (ORIF) PERIPROSTHETIC PROXIMAL FEMUR FRACTURE (Left)  SURGEON:  Surgeon(s) and Role:    * [name], MD - Primary  PHYSICIAN ASSISTANT: None  ANESTHESIA:   general  EBL:  50cc  BLOOD ADMINISTERED:none  DRAINS: none   LOCAL MEDICATIONS USED:  NONE  SPECIMEN:  No Specimen  DISPOSITION OF SPECIMEN:  N/A  COUNTS:  YES  TOURNIQUET:  * No tourniquets in log *  DICTATION: .Other Dictation: Dictation Number [id number]  PLAN OF CARE: Admit to inpatient   PATIENT DISPOSITION:  PACU - hemodynamically stable.   Delay start of Pharmacological VTE agent (>24hrs) due to surgical blood loss or risk of bleeding: no

## 2013-04-30 ENCOUNTER — Encounter (HOSPITAL_COMMUNITY): Payer: Self-pay | Admitting: Anesthesiology

## 2013-04-30 ENCOUNTER — Encounter (HOSPITAL_COMMUNITY): Payer: Medicare Other | Admitting: Anesthesiology

## 2013-04-30 ENCOUNTER — Encounter (HOSPITAL_COMMUNITY)
Admission: EM | Disposition: A | Discharge status: Skilled Nursing Facility | Payer: Self-pay | Source: Home / Self Care | Attending: Internal Medicine

## 2013-04-30 ENCOUNTER — Inpatient Hospital Stay (HOSPITAL_COMMUNITY): Payer: Medicare Other

## 2013-04-30 ENCOUNTER — Inpatient Hospital Stay (HOSPITAL_COMMUNITY): Payer: Medicare Other | Admitting: Anesthesiology

## 2013-04-30 HISTORY — PX: ORIF PERIPROSTHETIC FRACTURE: SHX5034

## 2013-04-30 LAB — CBC
HCT: 37.5 % (ref 36.0–46.0)
Hemoglobin: 12.2 g/dL (ref 12.0–15.0)
MCH: 29.3 pg (ref 26.0–34.0)
MCHC: 32.5 g/dL (ref 30.0–36.0)
MCV: 90.1 fL (ref 78.0–100.0)
Platelets: 190 10*3/uL (ref 150–400)
RBC: 4.16 MIL/uL (ref 3.87–5.11)
RDW: 14.4 % (ref 11.5–15.5)
WBC: 5.7 10*3/uL (ref 4.0–10.5)

## 2013-04-30 LAB — URINE CULTURE
Colony Count: NO GROWTH
Culture: NO GROWTH

## 2013-04-30 LAB — PROTIME-INR
INR: 1.51 — ABNORMAL HIGH (ref 0.00–1.49)
Prothrombin Time: 17.8 seconds — ABNORMAL HIGH (ref 11.6–15.2)

## 2013-04-30 LAB — APTT: aPTT: 73 seconds — ABNORMAL HIGH (ref 24–37)

## 2013-04-30 LAB — HEPARIN LEVEL (UNFRACTIONATED): Heparin Unfractionated: 0.47 IU/mL (ref 0.30–0.70)

## 2013-04-30 SURGERY — OPEN REDUCTION INTERNAL FIXATION (ORIF) PERIPROSTHETIC FRACTURE
Anesthesia: General | Site: Hip | Laterality: Left

## 2013-04-30 MED ORDER — LIDOCAINE HCL (CARDIAC) 20 MG/ML IV SOLN
INTRAVENOUS | Status: DC | PRN
  Administered 2013-04-30: 50 mg via INTRAVENOUS

## 2013-04-30 MED ORDER — METHOCARBAMOL 100 MG/ML IJ SOLN
500.0000 mg | Freq: Four times a day (QID) | INTRAVENOUS | Status: DC | PRN
  Filled 2013-04-30: qty 5

## 2013-04-30 MED ORDER — FENTANYL CITRATE 0.05 MG/ML IJ SOLN
25.0000 ug | INTRAMUSCULAR | Status: DC | PRN
  Administered 2013-04-30 (×3): 50 ug via INTRAVENOUS

## 2013-04-30 MED ORDER — HYDROCODONE-ACETAMINOPHEN 5-325 MG PO TABS
1.0000 | ORAL_TABLET | Freq: Four times a day (QID) | ORAL | Status: DC | PRN
  Administered 2013-05-01 (×3): 1 via ORAL
  Administered 2013-05-02: 2 via ORAL
  Administered 2013-05-02: 1 via ORAL
  Administered 2013-05-03: 2 via ORAL
  Administered 2013-05-04: 1 via ORAL
  Filled 2013-04-30 (×3): qty 1
  Filled 2013-04-30: qty 2

## 2013-04-30 MED ORDER — METHOCARBAMOL 500 MG PO TABS
500.0000 mg | ORAL_TABLET | Freq: Four times a day (QID) | ORAL | Status: DC | PRN
  Administered 2013-05-01: 500 mg via ORAL
  Filled 2013-04-30 (×2): qty 1

## 2013-04-30 MED ORDER — EPHEDRINE SULFATE 50 MG/ML IJ SOLN
INTRAMUSCULAR | Status: DC | PRN
  Administered 2013-04-30: 10 mg via INTRAVENOUS

## 2013-04-30 MED ORDER — METOCLOPRAMIDE HCL 5 MG/ML IJ SOLN
5.0000 mg | Freq: Three times a day (TID) | INTRAMUSCULAR | Status: DC | PRN
  Filled 2013-04-30: qty 2

## 2013-04-30 MED ORDER — ONDANSETRON HCL 4 MG/2ML IJ SOLN
INTRAMUSCULAR | Status: DC | PRN
  Administered 2013-04-30: 4 mg via INTRAVENOUS

## 2013-04-30 MED ORDER — MAGNESIUM CITRATE PO SOLN
0.5000 | Freq: Once | ORAL | Status: AC | PRN
  Filled 2013-04-30: qty 296

## 2013-04-30 MED ORDER — ALUM & MAG HYDROXIDE-SIMETH 200-200-20 MG/5ML PO SUSP: 30.0000 mL | ORAL | Status: DC | PRN

## 2013-04-30 MED ORDER — SODIUM CHLORIDE 0.9 % IV SOLN
INTRAVENOUS | Status: DC
  Administered 2013-04-30: 12:00:00 via INTRAVENOUS

## 2013-04-30 MED ORDER — WARFARIN - PHARMACIST DOSING INPATIENT
Freq: Every day | Status: DC
  Administered 2013-05-03: 18:00:00

## 2013-04-30 MED ORDER — WARFARIN SODIUM 7.5 MG PO TABS
7.5000 mg | ORAL_TABLET | Freq: Once | ORAL | Status: AC
  Administered 2013-04-30: 7.5 mg via ORAL
  Filled 2013-04-30: qty 1

## 2013-04-30 MED ORDER — FENTANYL CITRATE 0.05 MG/ML IJ SOLN
INTRAMUSCULAR | Status: AC
  Filled 2013-04-30: qty 5

## 2013-04-30 MED ORDER — MORPHINE SULFATE 2 MG/ML IJ SOLN
0.5000 mg | INTRAMUSCULAR | Status: DC | PRN
  Administered 2013-04-30 – 2013-05-01 (×2): 0.5 mg via INTRAVENOUS
  Filled 2013-04-30: qty 1

## 2013-04-30 MED ORDER — ROCURONIUM BROMIDE 100 MG/10ML IV SOLN
INTRAVENOUS | Status: DC | PRN
  Administered 2013-04-30: 20 mg via INTRAVENOUS

## 2013-04-30 MED ORDER — LACTATED RINGERS IV SOLN
INTRAVENOUS | Status: DC | PRN
  Administered 2013-04-30: 17:00:00 via INTRAVENOUS

## 2013-04-30 MED ORDER — FENTANYL CITRATE 0.05 MG/ML IJ SOLN
INTRAMUSCULAR | Status: DC | PRN
  Administered 2013-04-30: 100 ug via INTRAVENOUS

## 2013-04-30 MED ORDER — FENTANYL CITRATE 0.05 MG/ML IJ SOLN
INTRAMUSCULAR | Status: AC
  Administered 2013-04-30: 50 ug via INTRAVENOUS
  Filled 2013-04-30: qty 2

## 2013-04-30 MED ORDER — POLYETHYLENE GLYCOL 3350 17 G PO PACK
17.0000 g | PACK | Freq: Every day | ORAL | Status: DC | PRN
  Administered 2013-05-01: 17 g via ORAL
  Filled 2013-04-30: qty 1

## 2013-04-30 MED ORDER — SUCCINYLCHOLINE CHLORIDE 20 MG/ML IJ SOLN
INTRAMUSCULAR | Status: DC | PRN
  Administered 2013-04-30: 100 mg via INTRAVENOUS

## 2013-04-30 MED ORDER — FENTANYL CITRATE 0.05 MG/ML IJ SOLN
INTRAMUSCULAR | Status: AC
  Filled 2013-04-30: qty 2

## 2013-04-30 MED ORDER — 0.9 % SODIUM CHLORIDE (POUR BTL) OPTIME
TOPICAL | Status: DC | PRN
  Administered 2013-04-30: 1000 mL

## 2013-04-30 MED ORDER — DOCUSATE SODIUM 100 MG PO CAPS
100.0000 mg | ORAL_CAPSULE | Freq: Two times a day (BID) | ORAL | Status: DC
  Administered 2013-04-30 – 2013-05-04 (×8): 100 mg via ORAL
  Filled 2013-04-30 (×13): qty 1

## 2013-04-30 MED ORDER — PROPOFOL 10 MG/ML IV BOLUS
INTRAVENOUS | Status: DC | PRN
  Administered 2013-04-30: 100 mg via INTRAVENOUS

## 2013-04-30 MED ORDER — NEOSTIGMINE METHYLSULFATE 1 MG/ML IJ SOLN
INTRAMUSCULAR | Status: DC | PRN
  Administered 2013-04-30: 3 mg via INTRAVENOUS

## 2013-04-30 MED ORDER — HYDROMORPHONE HCL PF 1 MG/ML IJ SOLN: 0.2500 mg | INTRAMUSCULAR | Status: DC | PRN

## 2013-04-30 MED ORDER — CALCIUM CARBONATE-VITAMIN D 500-200 MG-UNIT PO TABS
1.0000 | ORAL_TABLET | Freq: Every day | ORAL | Status: DC
  Administered 2013-05-01 – 2013-05-04 (×4): 1 via ORAL
  Filled 2013-04-30 (×4): qty 1

## 2013-04-30 MED ORDER — ERYTHROMYCIN BASE 250 MG PO TABS
250.0000 mg | ORAL_TABLET | Freq: Two times a day (BID) | ORAL | Status: DC
  Administered 2013-05-01 – 2013-05-04 (×6): 250 mg via ORAL
  Filled 2013-04-30 (×9): qty 1

## 2013-04-30 MED ORDER — METOCLOPRAMIDE HCL 5 MG PO TABS
5.0000 mg | ORAL_TABLET | Freq: Three times a day (TID) | ORAL | Status: DC | PRN
  Filled 2013-04-30: qty 2

## 2013-04-30 MED ORDER — ONDANSETRON HCL 4 MG/2ML IJ SOLN: 4.0000 mg | Freq: Once | INTRAMUSCULAR | Status: DC | PRN

## 2013-04-30 MED ORDER — ONDANSETRON HCL 4 MG/2ML IJ SOLN
4.0000 mg | Freq: Four times a day (QID) | INTRAMUSCULAR | Status: DC | PRN
Start: 2013-04-30 — End: 2013-05-04
  Administered 2013-04-30: 4 mg via INTRAVENOUS
  Filled 2013-04-30: qty 2

## 2013-04-30 MED ORDER — ROCURONIUM BROMIDE 50 MG/5ML IV SOLN
INTRAVENOUS | Status: AC
  Filled 2013-04-30: qty 1

## 2013-04-30 MED ORDER — MENTHOL 3 MG MT LOZG
1.0000 | LOZENGE | OROMUCOSAL | Status: DC | PRN
  Filled 2013-04-30: qty 9

## 2013-04-30 MED ORDER — SUCCINYLCHOLINE CHLORIDE 20 MG/ML IJ SOLN
INTRAMUSCULAR | Status: AC
  Filled 2013-04-30: qty 1

## 2013-04-30 MED ORDER — GLYCOPYRROLATE 0.2 MG/ML IJ SOLN
INTRAMUSCULAR | Status: DC | PRN
  Administered 2013-04-30: 0.6 mg via INTRAVENOUS

## 2013-04-30 MED ORDER — PROPOFOL 10 MG/ML IV BOLUS
INTRAVENOUS | Status: AC
  Filled 2013-04-30: qty 20

## 2013-04-30 MED ORDER — LIDOCAINE HCL (CARDIAC) 20 MG/ML IV SOLN
INTRAVENOUS | Status: AC
  Filled 2013-04-30: qty 5

## 2013-04-30 MED ORDER — ACETAMINOPHEN 650 MG RE SUPP
650.0000 mg | Freq: Four times a day (QID) | RECTAL | Status: DC | PRN
  Filled 2013-04-30: qty 1

## 2013-04-30 MED ORDER — ONDANSETRON HCL 4 MG/2ML IJ SOLN: 4.0000 mg | Freq: Once | INTRAMUSCULAR | Status: AC | PRN

## 2013-04-30 MED ORDER — PROSIGHT PO TABS
1.0000 | ORAL_TABLET | Freq: Every day | ORAL | Status: DC
  Administered 2013-05-02 – 2013-05-03 (×2): 1 via ORAL
  Filled 2013-04-30 (×4): qty 1

## 2013-04-30 MED ORDER — CLINDAMYCIN PHOSPHATE 600 MG/50ML IV SOLN
600.0000 mg | Freq: Four times a day (QID) | INTRAVENOUS | Status: AC
  Administered 2013-05-01 (×2): 600 mg via INTRAVENOUS
  Filled 2013-04-30 (×2): qty 50

## 2013-04-30 MED ORDER — PHENYLEPHRINE 40 MCG/ML (10ML) SYRINGE FOR IV PUSH (FOR BLOOD PRESSURE SUPPORT)
PREFILLED_SYRINGE | INTRAVENOUS | Status: AC
  Filled 2013-04-30: qty 10

## 2013-04-30 MED ORDER — ACETAMINOPHEN 325 MG PO TABS: 650.0000 mg | ORAL_TABLET | Freq: Four times a day (QID) | ORAL | Status: DC | PRN

## 2013-04-30 MED ORDER — CLINDAMYCIN PHOSPHATE 900 MG/50ML IV SOLN
INTRAVENOUS | Status: AC
  Administered 2013-04-30: 900 mg via INTRAVENOUS
  Filled 2013-04-30: qty 50

## 2013-04-30 MED ORDER — PHENYLEPHRINE HCL 10 MG/ML IJ SOLN
10.0000 mg | INTRAVENOUS | Status: DC | PRN
  Administered 2013-04-30: 15 ug/min via INTRAVENOUS

## 2013-04-30 MED ORDER — PHENOL 1.4 % MT LIQD
1.0000 | OROMUCOSAL | Status: DC | PRN
  Filled 2013-04-30: qty 177

## 2013-04-30 MED ORDER — SODIUM CHLORIDE 0.9 % IV SOLN
INTRAVENOUS | Status: DC
  Administered 2013-04-30 – 2013-05-01 (×2): via INTRAVENOUS
  Filled 2013-04-30 (×3): qty 1000

## 2013-04-30 MED ORDER — ONDANSETRON HCL 4 MG PO TABS
4.0000 mg | ORAL_TABLET | Freq: Four times a day (QID) | ORAL | Status: DC | PRN
  Administered 2013-05-01 – 2013-05-03 (×3): 4 mg via ORAL
  Filled 2013-04-30 (×3): qty 1

## 2013-04-30 SURGICAL SUPPLY — 41 items
BRUSH FEMORAL CANAL (MISCELLANEOUS) IMPLANT
CABLE WITH CRIMP (Cable) ×4 IMPLANT
COVER BACK TABLE 24X17X13 BIG (DRAPES) IMPLANT
DECANTER SPIKE VIAL GLASS SM (MISCELLANEOUS) ×2 IMPLANT
DRAPE INCISE IOBAN 66X45 STRL (DRAPES) ×2 IMPLANT
DRAPE ORTHO SPLIT 77X108 STRL (DRAPES) ×4
DRAPE POUCH INSTRU U-SHP 10X18 (DRAPES) ×2 IMPLANT
DRAPE SURG ORHT 6 SPLT 77X108 (DRAPES) ×2 IMPLANT
DRSG MEPILEX BORDER 4X12 (GAUZE/BANDAGES/DRESSINGS) ×1 IMPLANT
DRSG PAD ABDOMINAL 8X10 ST (GAUZE/BANDAGES/DRESSINGS) ×2 IMPLANT
DURAPREP 26ML APPLICATOR (WOUND CARE) ×2 IMPLANT
ELECT CAUTERY BLADE 6.4 (BLADE) ×2 IMPLANT
ELECT REM PT RETURN 9FT ADLT (ELECTROSURGICAL) ×2
ELECTRODE REM PT RTRN 9FT ADLT (ELECTROSURGICAL) ×1 IMPLANT
FILTER STRAW FLUID ASPIR (MISCELLANEOUS) ×2 IMPLANT
GLOVE BIO SURGEON STRL SZ8.5 (GLOVE) ×2 IMPLANT
GOWN STRL REUS W/ TWL LRG LVL3 (GOWN DISPOSABLE) ×3 IMPLANT
GOWN STRL REUS W/TWL LRG LVL3 (GOWN DISPOSABLE) ×6
HANDPIECE INTERPULSE COAX TIP (DISPOSABLE)
IMMOBILIZER KNEE 22 UNIV (SOFTGOODS) ×2 IMPLANT
KIT BASIN OR (CUSTOM PROCEDURE TRAY) ×2 IMPLANT
KIT ROOM TURNOVER OR (KITS) ×2 IMPLANT
MANIFOLD NEPTUNE II (INSTRUMENTS) ×2 IMPLANT
NS IRRIG 1000ML POUR BTL (IV SOLUTION) ×4 IMPLANT
PACK TOTAL JOINT (CUSTOM PROCEDURE TRAY) ×2 IMPLANT
PAD ARMBOARD 7.5X6 YLW CONV (MISCELLANEOUS) ×4 IMPLANT
PRESSURIZER FEMORAL UNIV (MISCELLANEOUS) IMPLANT
SET HNDPC FAN SPRY TIP SCT (DISPOSABLE) IMPLANT
SPONGE GAUZE 4X4 12PLY (GAUZE/BANDAGES/DRESSINGS) ×2 IMPLANT
SPONGE LAP 4X18 X RAY DECT (DISPOSABLE) ×2 IMPLANT
STAPLER VISISTAT 35W (STAPLE) ×1 IMPLANT
SUT VIC AB 1 CT1 27 (SUTURE) ×4
SUT VIC AB 1 CT1 27XBRD ANBCTR (SUTURE) IMPLANT
SUT VIC AB 2-0 CT1 27 (SUTURE) ×2
SUT VIC AB 2-0 CT1 TAPERPNT 27 (SUTURE) IMPLANT
SUT VLOC 180 0 24IN GS25 (SUTURE) ×1 IMPLANT
TOWEL OR 17X24 6PK STRL BLUE (TOWEL DISPOSABLE) ×2 IMPLANT
TOWEL OR 17X26 10 PK STRL BLUE (TOWEL DISPOSABLE) ×2 IMPLANT
TOWER CARTRIDGE SMART MIX (DISPOSABLE) IMPLANT
TRAY FOLEY CATH 16FRSI W/METER (SET/KITS/TRAYS/PACK) ×2 IMPLANT
WATER STERILE IRR 1000ML POUR (IV SOLUTION) ×2 IMPLANT

## 2013-04-30 NOTE — Anesthesia Procedure Notes (Signed)
Procedure Name: Intubation Date/Time: 04/30/2013 6:15 PM Performed by: Eligha Bridegroom Pre-anesthesia Checklist: Patient identified, Timeout performed, Emergency Drugs available and Suction available Patient Re-evaluated:Patient Re-evaluated prior to inductionOxygen Delivery Method: Circle system utilized Intubation Type: IV induction Ventilation: Mask ventilation without difficulty Laryngoscope Size: Mac and 3 Grade View: Grade II Tube type: Oral Tube size: 7.5 mm Number of attempts: 1 Airway Equipment and Method: Stylet Placement Confirmation: ETT inserted through vocal cords under direct vision,  breath sounds checked- equal and bilateral and positive ETCO2 Secured at: 21 cm Tube secured with: Tape Dental Injury: Teeth and Oropharynx as per pre-operative assessment

## 2013-04-30 NOTE — Transfer of Care (Signed)
Immediate Anesthesia Transfer of Care Note  Patient: Gloria [name]  Procedure(s) Performed: Procedure(s): OPEN REDUCTION INTERNAL FIXATION (ORIF) PERIPROSTHETIC FRACTURE (Left)  Patient Location: PACU  Anesthesia Type:General  Level of Consciousness: awake and alert   Airway & Oxygen Therapy: Patient Spontanous Breathing and Patient connected to nasal cannula oxygen  Post-op Assessment: Report given to PACU RN and Post -op Vital signs reviewed and stable  Post vital signs: Reviewed and stable  Complications: No apparent anesthesia complications

## 2013-04-30 NOTE — Progress Notes (Addendum)
[facility] PROGRESS NOTE  Gloria [name]:[id number] DOB: Aug 28, 1930 DOA: 04/28/2013 PCP: Gloria Lopes, MD  Assessment/Plan: 1. Left proximal femoral fracture -Status post mechanical fall at home. -CT scan showing undisplaced fracture surrounding the femoral prosthesis on the left just below the lesser trochanter  -Orthopedic surgery consulted plan for ORIF today  2. Atrial fibrillation -Remains rate controlled -Continue flecainide 50 mg by mouth twice a day -Had been on Coumadin therapy, reversed for procedure  3. Anticoagulation -Coumadin reversed for procedure, covered with IV heparin  4. Hypertension -Hydrochlorothiazide held -Blood pressure stable   Code Status: Full code  Family Communication:  Disposition Plan: Plan for ORIF today   Consultants:  Orthopedic surgery  Procedures:  Plan for ORIF on 04/29/2013  Antibiotics:    HPI/Subjective: Patient is a pleasant 80 year old female present residing in the community, was admitted to the medicine service on 04/28/2013 presenting to the emergency room after having a fall on her driveway. She states that she was doing yardwork, turned around lost her balance and fell. Imaging studies on presentation showed left proximal femoral fracture. History consulted, plan for ORIF later today  Objective: Filed Vitals:   04/30/13 1353  BP: 133/54  Pulse: 59  Temp: 97.8 F (36.6 C)  Resp: 17    Intake/Output Summary (Last 24 hours) at 04/30/13 1656 Last data filed at 04/30/13 1352  Gross per 24 hour  Intake    100 ml  Output   2075 ml  Net  -1975 ml   Filed Weights   04/28/13 2024  Weight: 69.1 kg (152 lb 5.4 oz)    Exam:   General:  Patient is in no acute distress awake alert   Cardiovascular: Regular rate and rhythm normal S1-S2   Respiratory: Clear to auscultation bilaterally no wheezing rhonchi or rales   Abdomen: Soft nontender nondistended   Musculoskeletal: Traction device in  place, patient having pain to left lower extremity with passive and active movement  Data Reviewed: Basic Metabolic Panel:  Recent Labs Lab 04/28/13 1529 04/29/13 0420  NA 143 141  K 4.0 3.9  CL 104 106  CO2 23 22  GLUCOSE 129* 89  BUN 22 16  CREATININE 0.73 0.70  CALCIUM 9.5 8.5   Liver Function Tests:  Recent Labs Lab 04/28/13 1529  AST 23  ALT 16  ALKPHOS 120*  BILITOT 0.6  PROT 6.7  ALBUMIN 3.8   No results found for this basename: LIPASE, AMYLASE,  in the last 168 hours No results found for this basename: AMMONIA,  in the last 168 hours CBC:  Recent Labs Lab 04/28/13 1529 04/29/13 0420 04/30/13 0446  WBC 6.0 7.1 5.7  NEUTROABS 4.3  --   --   HGB 13.8 11.8* 12.2  HCT 42.2 36.0 37.5  MCV 90.0 88.9 90.1  PLT 234 202 190   Cardiac Enzymes:  Recent Labs Lab 04/28/13 1904 04/29/13 0038 04/29/13 0645  TROPONINI <0.30 <0.30 <0.30   BNP (last 3 results) No results found for this basename: PROBNP,  in the last 8760 hours CBG: No results found for this basename: GLUCAP,  in the last 168 hours  Recent Results (from the past 240 hour(s))  URINE CULTURE     Status: None   Collection Time    04/28/13 10:23 PM      Result Value Ref Range Status   Specimen Description URINE, CATHETERIZED   Final   Special Requests NONE   Final   Culture  Setup Time  Final   Value: 04/28/2013 23:53     Performed at [place]     Final   Value: NO GROWTH     Performed at [facility]   Culture     Final   Value: NO GROWTH     Performed at [facility]   Report Status 04/30/2013 FINAL   Final  MRSA PCR SCREENING     Status: None   Collection Time    04/29/13  5:38 AM      Result Value Ref Range Status   MRSA by PCR NEGATIVE  NEGATIVE Final   Comment:            The GeneXpert MRSA Assay (FDA     approved for NASAL specimens     only), is one component of a     comprehensive MRSA colonization     surveillance program.  It is not     intended to diagnose MRSA     infection nor to guide or     monitor treatment for     MRSA infections.     Studies: Ct Head Wo Contrast  04/28/2013   CLINICAL DATA:  Recent traumatic injury and pain  EXAM: CT HEAD WITHOUT CONTRAST  TECHNIQUE: Contiguous axial images were obtained from the base of the skull through the vertex without intravenous contrast.  COMPARISON:  11/21/2009  FINDINGS: The bony calvarium is intact. No gross soft tissue abnormality is noted. There is a calcified meningioma noted in the right occipital region stable from the prior exam. Mild atrophic changes are seen. No findings to suggest acute hemorrhage, acute infarction or space-occupying mass lesion are noted.  IMPRESSION: No acute abnormality seen.   Electronically Signed   By: [name] M.D.   On: 04/28/2013 17:36   Ct Hip Left Wo Contrast  04/28/2013   CLINICAL DATA:  Possible undisplaced fracture on recent plain film examination, recent trauma  EXAM: CT OF THE LEFT HIP WITHOUT CONTRAST  TECHNIQUE: Multidetector CT imaging was performed according to the standard protocol. Multiplanar CT image reconstructions were also generated.  COMPARISON:  Plain film from earlier in the same day.  FINDINGS: The left hip prosthesis is again identified and causes significant scatter artifact and some degradation of the images. There is however nondisplaced fracture surrounding the prosthesis just below the lesser trochanter moving in a spiral formation around the posterior aspect of the femur. This extends towards the midshaft of the femur but is again only mildly displaced. No other fractures are seen. Some changes are noted in the surrounding muscle bellies likely related to localized edema from the fracture. The prosthesis is well seated within the acetabulum. No other focal abnormality is noted.  IMPRESSION: Undisplaced fracture or surrounding the femoral prosthesis on the left just below the lesser trochanter and  extending in is somewhat spiral fracture on the posterior aspect of the proximal femur.   Electronically Signed   By: [name] M.D.   On: 04/28/2013 17:40    Scheduled Meds: . flecainide  50 mg Oral BID  . hydrochlorothiazide  25 mg Oral Daily  . pantoprazole  40 mg Oral BID AC  . vitamin B-12  1,000 mcg Oral Daily   Continuous Infusions: . sodium chloride 75 mL/hr at 04/30/13 1345  . heparin 1,100 Units/hr (04/29/13 2251)    Principal Problem:   Closed left hip fracture Active Problems:   Hypertension   Hyperlipidemia   Atrial  fibrillation   Warfarin anticoagulation   Hip fracture    Time spent: 25 minutes    [facility] Pager [phone]. If 7PM-7AM, please contact night-coverage at [email or web address], password [facility] 04/30/2013, 4:56 PM  LOS: 2 days

## 2013-04-30 NOTE — Progress Notes (Signed)
ANTICOAGULATION CONSULT NOTE - Follow Up Consult  Pharmacy Consult for Coumadin Indication: atrial fibrillation and VTE prophylaxis  Allergies  Allergen Reactions  . Amoxicillin Other (See Comments)    unknown  . Cephalexin Other (See Comments)    unknown  . Codeine Other (See Comments)    unknown  . Metoclopramide Hcl Other (See Comments)    unknown    Patient Measurements: Height: 5\' 5"  (165.1 cm) Weight: 152 lb 5.4 oz (69.1 kg) IBW/kg (Calculated) : 57  Vital Signs: Temp: 97.3 F (36.3 C) (04/24 2100) Temp src: Axillary (04/24 2100) BP: 171/82 mmHg (04/24 2126) Pulse Rate: 79 (04/24 2126)  Labs:  Recent Labs  04/28/13 1529 04/28/13 1904 04/29/13 0038 04/29/13 0420 04/29/13 0645 04/29/13 1307 04/30/13 0446 04/30/13 1727  HGB 13.8  --   --  11.8*  --   --  12.2  --   HCT 42.2  --   --  36.0  --   --  37.5  --   PLT 234  --   --  202  --   --  190  --   APTT  --   --   --   --   --   --   --  73*  LABPROT 19.0*  --   --   --   --   --   --  17.8*  INR 1.64*  --   --   --   --   --   --  1.51*  HEPARINUNFRC  --   --   --  0.16*  --  0.34 0.47  --   CREATININE 0.73  --   --  0.70  --   --   --   --   TROPONINI  --  <0.30 <0.30  --  <0.30  --   --   --     Estimated Creatinine Clearance: 54.7 ml/min (by C-G formula based on Cr of 0.7).   Assessment: 21 YOF on Coumadin PTA for history of Afib admitted after sustaining a fall. She is now s/p ORIF L hip fracture. Pharmacy consulted to resume Coumadin post-op. INR is subtherapeutic at 1.51. Baseline CBC is normal.  Home dose is 6 mg daily except 4 mg on Tues and Sat.  Goal of Therapy:  INR 2-3 Monitor platelets by anticoagulation protocol: Yes   Plan:  - Coumadin 7.5 mg PO tonight - INR daily - Monitor for s/sx of bleeding  [facility], Pharm.D., BCPS Clinical Pharmacist Pager: [phone] 04/30/2013 9:39 PM

## 2013-04-30 NOTE — Progress Notes (Signed)
Patient ID: Gloria [name], female   DOB: Jul 08, 1930, 80 y.o.   MRN: [id number]  Unfortunately, I made a decision to cancel her case at 10:30 last neigh after getting bumped due to general surgery emergency  She is very understanding which I appreciate very much  NPO make sure that she is NPO after 9 am this OR this pm (5:30) for ORIF left hip fracture

## 2013-04-30 NOTE — Anesthesia Postprocedure Evaluation (Signed)
  Anesthesia Post-op Note  Patient: MIYOKO HASHIMI  Procedure(s) Performed: Procedure(s): OPEN REDUCTION INTERNAL FIXATION (ORIF) PERIPROSTHETIC FRACTURE (Left)  Patient Location: PACU  Anesthesia Type:General  Level of Consciousness: awake, alert  and oriented  Airway and Oxygen Therapy: Patient Spontanous Breathing and Patient connected to nasal cannula oxygen  Post-op Pain: mild  Post-op Assessment: Post-op Vital signs reviewed and Patient's Cardiovascular Status Stable  Post-op Vital Signs: Reviewed and stable  Last Vitals:  Filed Vitals:   04/30/13 1945  BP:   Pulse: 78  Temp:   Resp: 25    Complications: No apparent anesthesia complications

## 2013-04-30 NOTE — Progress Notes (Signed)
Pt noted to only have approximately 200cc in foley drainage bag.  Bladder scan revealed 636 cc in bladder.  Foley repositioned and post bladder scan revealed 24cc left in bladder.  Pt with 575cc urine out.  Will continue to monitor and report to day shift nurse.  Call bell within reach.

## 2013-04-30 NOTE — Progress Notes (Signed)
ANTICOAGULATION CONSULT NOTE - Follow Up Consult  Pharmacy Consult:  Heparin Indication: atrial fibrillation  Allergies  Allergen Reactions  . Amoxicillin Other (See Comments)    unknown  . Cephalexin Other (See Comments)    unknown  . Codeine Other (See Comments)    unknown  . Metoclopramide Hcl Other (See Comments)    unknown    Patient Measurements: Height: 5\' 5"  (165.1 cm) Weight: 152 lb 5.4 oz (69.1 kg) IBW/kg (Calculated) : 57 Heparin Dosing Weight: 66 kg  Vital Signs: Temp: 98.5 F (36.9 C) (04/24 0519) Temp src: Oral (04/24 0519) BP: 140/60 mmHg (04/24 0519) Pulse Rate: 54 (04/24 0519)  Labs:  Recent Labs  04/28/13 1529 04/28/13 1904 04/29/13 0038 04/29/13 0420 04/29/13 0645 04/29/13 1307 04/30/13 0446  HGB 13.8  --   --  11.8*  --   --  12.2  HCT 42.2  --   --  36.0  --   --  37.5  PLT 234  --   --  202  --   --  190  LABPROT 19.0*  --   --   --   --   --   --   INR 1.64*  --   --   --   --   --   --   HEPARINUNFRC  --   --   --  0.16*  --  0.34 0.47  CREATININE 0.73  --   --  0.70  --   --   --   TROPONINI  --  <0.30 <0.30  --  <0.30  --   --     Estimated Creatinine Clearance: 54.7 ml/min (by C-G formula based on Cr of 0.7).     Assessment: 29 YOF on Coumadin PTA for history of Afib admitted after sustaining a fall.  Patient was transitioned to IV heparin as bridge therapy for ORIF left hip fracture.  Heparin level therapeutic.  No bleeding reported.     Goal of Therapy:  Heparin level 0.3-0.7 units/ml Monitor platelets by anticoagulation protocol: Yes    Plan:  - Continue heparin gtt at 1100 units/hr - Daily HL / CBC - F/U resume heparin / Coumadin post surgery    Crissie Aloi D. Mina Marble, PharmD, BCPS Pager:  [phone] 04/30/2013, 9:07 AM

## 2013-05-01 LAB — CBC
HCT: 36.7 % (ref 36.0–46.0)
Hemoglobin: 11.9 g/dL — ABNORMAL LOW (ref 12.0–15.0)
MCH: 29.2 pg (ref 26.0–34.0)
MCHC: 32.4 g/dL (ref 30.0–36.0)
MCV: 90 fL (ref 78.0–100.0)
Platelets: 193 10*3/uL (ref 150–400)
RBC: 4.08 MIL/uL (ref 3.87–5.11)
RDW: 14 % (ref 11.5–15.5)
WBC: 8.5 10*3/uL (ref 4.0–10.5)

## 2013-05-01 LAB — BASIC METABOLIC PANEL
BUN: 11 mg/dL (ref 6–23)
CO2: 24 mEq/L (ref 19–32)
Calcium: 8.9 mg/dL (ref 8.4–10.5)
Chloride: 102 mEq/L (ref 96–112)
Creatinine, Ser: 0.62 mg/dL (ref 0.50–1.10)
GFR calc Af Amer: >90 mL/min (ref >90)
GFR calc non Af Amer: 83 mL/min — ABNORMAL LOW (ref >90)
Glucose, Bld: 84 mg/dL (ref 70–99)
Potassium: 4.1 mEq/L (ref 3.7–5.3)
Sodium: 140 mEq/L (ref 137–147)

## 2013-05-01 LAB — PROTIME-INR
INR: 1.55 — ABNORMAL HIGH (ref 0.00–1.49)
Prothrombin Time: 18.2 seconds — ABNORMAL HIGH (ref 11.6–15.2)

## 2013-05-01 MED ORDER — WARFARIN SODIUM 7.5 MG PO TABS
7.5000 mg | ORAL_TABLET | Freq: Once | ORAL | Status: AC
  Administered 2013-05-01: 7.5 mg via ORAL
  Filled 2013-05-01: qty 1

## 2013-05-01 NOTE — Progress Notes (Signed)
Orthopedic Tech Progress Note Patient Details:  Gloria [name] Jun 05, 1930 [id number] Patient unable to use [facility] Patient ID: Gloria [name], female   DOB: 10-16-1930, 80 y.o.   MRN: [id number]   Fenton Foy 05/01/2013, 11:37 AM

## 2013-05-01 NOTE — Progress Notes (Signed)
ANTICOAGULATION CONSULT NOTE - Follow Up Consult  Pharmacy Consult for Coumadin Indication: atrial fibrillation and VTE prophylaxis  Allergies  Allergen Reactions  . Amoxicillin Other (See Comments)    unknown  . Cephalexin Other (See Comments)    unknown  . Codeine Other (See Comments)    unknown  . Metoclopramide Hcl Other (See Comments)    unknown    Patient Measurements: Height: 5\' 5"  (165.1 cm) Weight: 152 lb 5.4 oz (69.1 kg) IBW/kg (Calculated) : 57  Vital Signs: Temp: 98.4 F (36.9 C) (04/25 1515) Temp src: Oral (04/25 1515) BP: 156/47 mmHg (04/25 1515) Pulse Rate: 59 (04/25 1515)  Labs:  Recent Labs  04/28/13 1904 04/29/13 0038  04/29/13 0420 04/29/13 0645 04/29/13 1307 04/30/13 0446 04/30/13 1727 05/01/13 0543  HGB  --   --   < > 11.8*  --   --  12.2  --  11.9*  HCT  --   --   --  36.0  --   --  37.5  --  36.7  PLT  --   --   --  202  --   --  190  --  193  APTT  --   --   --   --   --   --   --  73*  --   LABPROT  --   --   --   --   --   --   --  17.8* 18.2*  INR  --   --   --   --   --   --   --  1.51* 1.55*  HEPARINUNFRC  --   --   --  0.16*  --  0.34 0.47  --   --   CREATININE  --   --   --  0.70  --   --   --   --  0.62  TROPONINI <0.30 <0.30  --   --  <0.30  --   --   --   --   < > = values in this interval not displayed.  Estimated Creatinine Clearance: 54.7 ml/min (by C-G formula based on Cr of 0.62).   Assessment:  80 yo F on Coumadin PTA for history of Afib admitted after sustaining a fall. She is now s/p ORIF L hip fracture. Pharmacy consulted to resume Coumadin post-op on 4/24. INR subtherapeutic as expected.    Home dose is 6 mg daily except 4 mg on Tues and Sat.  Goal of Therapy:  INR 2-3 Monitor platelets by anticoagulation protocol: Yes   Plan:  - Coumadin 7.5 mg PO tonight - INR daily - Monitor for s/sx of bleeding  [employer], Pharm.D., BCPS Clinical Pharmacist Pager [phone] 05/01/2013 3:36 PM

## 2013-05-01 NOTE — Evaluation (Signed)
Physical Therapy Evaluation Patient Details Name: Gloria [name] MRN: [id number] DOB: May 20, 1930 Today's Date: 05/01/2013   History of Present Illness  Pt admit after fall with left proximal femur fracture with ORIF.    Clinical Impression  Pt admitted with above. Pt currently with functional limitations due to the deficits listed below (see PT Problem List).  Pt will benefit from skilled PT to increase their independence and safety with mobility to allow discharge to the venue listed below.    Follow Up Recommendations CIR;Supervision/Assistance - 24 hour    Equipment Recommendations  None recommended by PT    Recommendations for Other Services Rehab consult     Precautions / Restrictions Precautions Precautions: Fall Restrictions Weight Bearing Restrictions: Yes LLE Weight Bearing: Partial weight bearing      Mobility  Bed Mobility Overal bed mobility: Needs Assistance;+2 for physical assistance Bed Mobility: Supine to Sit     Supine to sit: Max assist;+2 for physical assistance     General bed mobility comments: Needed assist for LES and elevation of trunk.  Transfers Overall transfer level: Needs assistance Equipment used: Rolling walker (2 wheeled) Transfers: Sit to/from [facility] Sit to Stand: Mod assist;+2 physical assistance Stand pivot transfers: Min assist;Mod assist;+2 safety/equipment       General transfer comment: Pt having a lot of difficulty with left LE due to weakness.  In a lot of pain sitting EOB and with movement to EOB.  Pt was able to assist with sit to stand with assist and cues for hand placement.  With cues, able to take pivotal steps to recliner using RW.  Maintains PWB on left LE.    Ambulation/Gait                Stairs            Wheelchair Mobility    Modified Rankin (Stroke Patients Only)       Balance Overall balance assessment: Needs assistance;History of Falls Sitting-balance support:  Bilateral upper extremity supported;Feet supported Sitting balance-Leahy Scale: Poor Sitting balance - Comments: Leaning posterior and to right due to pain Postural control: Posterior lean;Right lateral lean Standing balance support: Bilateral upper extremity supported;During functional activity Standing balance-Leahy Scale: Poor Standing balance comment: Needs support and RW to stand and pt with rigth lateral lean to take weight off of left LE                             Pertinent Vitals/Pain VSS, 10/10 left hip pain per pt with pt already having vicodin. Called nursing for nausea meds.      Home Living Family/patient expects to be discharged to:: Private residence Living Arrangements: Spouse/significant other Available Help at Discharge: Family;Available PRN/intermittently Type of Home: House Home Access: Stairs to enter Entrance Stairs-Rails: Left Entrance Stairs-Number of Steps: 1 Home Layout: One level Home Equipment: Shower seat;Cane - single point;Walker - 4 wheels;Bedside commode;Grab bars - tub/shower Additional Comments: takes care of husband who has dementia and other health issues. She mostly helps with pills etc. Daughter is assisting him right now but can't stay and help when pt goes home.     Prior Function Level of Independence: Independent         Comments: Pt drives and does gardening.  Was actually gardening when she fell     Hand Dominance        Extremity/Trunk Assessment   Upper Extremity Assessment: Defer to OT evaluation  Lower Extremity Assessment: LLE deficits/detail         Communication   Communication: No difficulties  Cognition Arousal/Alertness: Awake/alert Behavior During Therapy: WFL for tasks assessed/performed Overall Cognitive Status: Within Functional Limits for tasks assessed                      General Comments      Exercises General Exercises - Lower Extremity Ankle Circles/Pumps:  AROM;Both;10 reps;Supine Long Arc Quad: AROM;Both;10 reps;Seated Heel Slides: AROM;Both;10 reps;Supine      Assessment/Plan    PT Assessment Patient needs continued PT services  PT Diagnosis Acute pain;Difficulty walking   PT Problem List Decreased activity tolerance;Decreased balance;Decreased mobility;Decreased knowledge of use of DME;Decreased safety awareness;Decreased knowledge of precautions;Pain  PT Treatment Interventions DME instruction;Gait training;Functional mobility training;Therapeutic activities;Therapeutic exercise;Balance training;Patient/family education   PT Goals (Current goals can be found in the Care Plan section) Acute Rehab PT Goals Patient Stated Goal: to go home PT Goal Formulation: With patient Time For Goal Achievement: 05/08/13 Potential to Achieve Goals: Good    Frequency Min 5X/week   Barriers to discharge        Co-evaluation               End of Session Equipment Utilized During Treatment: Gait belt Activity Tolerance: Patient limited by fatigue;Patient limited by pain;Other (comment) (nausea) Patient left: in chair;with call bell/phone within reach;with nursing/sitter in room Nurse Communication: Mobility status         Time: 1017-5102 PT Time Calculation (min): 22 min   Charges:   PT Evaluation $Initial PT Evaluation Tier I: 1 Procedure PT Treatments $Therapeutic Activity: 8-22 mins   PT G Codes:          Christianne Dolin 05-23-2013, 10:31 AM [facility] [phone] (pager)

## 2013-05-01 NOTE — Progress Notes (Signed)
[facility] PROGRESS NOTE  Gloria [name] ZOX:[id number] DOB: 1931-01-05 DOA: 04/28/2013 PCP: [name], MD  Assessment/Plan: 1. Left proximal femoral fracture -Status post mechanical fall at home. -CT scan showing undisplaced fracture surrounding the femoral prosthesis on the left just below the lesser trochanter  -S/P ORIF on 04/30/13, tolerated procedure well no complications.   2. Atrial fibrillation -Remains rate controlled -Continue flecainide 50 mg by mouth twice a day -Had been on Coumadin therapy, reversed for procedure  3. Anticoagulation -Coumadin reversed for procedure -Will restart coumadin  4. Hypertension -Hydrochlorothiazide held -Blood pressure stable   Code Status: Full code  Family Communication:  Disposition Plan: Plan for ORIF today   Consultants:  Orthopedic surgery  Procedures:  Plan for ORIF on 04/29/2013  Antibiotics:    HPI/Subjective: Patient is a pleasant 80 year old female present residing in the community, was admitted to the medicine service on 04/28/2013 presenting to the emergency room after having a fall on her driveway. She states that she was doing yardwork, turned around lost her balance and fell. Imaging studies on presentation showed left proximal femoral fracture. Reported nausea with standing earlier, feels better now  Objective: Filed Vitals:   05/01/13 1200  BP:   Pulse:   Temp:   Resp: 20    Intake/Output Summary (Last 24 hours) at 05/01/13 1338 Last data filed at 04/30/13 1930  Gross per 24 hour  Intake    800 ml  Output    550 ml  Net    250 ml   Filed Weights   04/28/13 2024  Weight: 69.1 kg (152 lb 5.4 oz)    Exam:   General:  Patient is in no acute distress awake alert   Cardiovascular: Regular rate and rhythm normal S1-S2   Respiratory: Clear to auscultation bilaterally no wheezing rhonchi or rales   Abdomen: Soft nontender nondistended   Musculoskeletal: Traction device in  place, patient having pain to left lower extremity with passive and active movement  Data Reviewed: Basic Metabolic Panel:  Recent Labs Lab 04/28/13 1529 04/29/13 0420 05/01/13 0543  NA 143 141 140  K 4.0 3.9 4.1  CL 104 106 102  CO2 23 22 24   GLUCOSE 129* 89 84  BUN 22 16 11   CREATININE 0.73 0.70 0.62  CALCIUM 9.5 8.5 8.9   Liver Function Tests:  Recent Labs Lab 04/28/13 1529  AST 23  ALT 16  ALKPHOS 120*  BILITOT 0.6  PROT 6.7  ALBUMIN 3.8   No results found for this basename: LIPASE, AMYLASE,  in the last 168 hours No results found for this basename: AMMONIA,  in the last 168 hours CBC:  Recent Labs Lab 04/28/13 1529 04/29/13 0420 04/30/13 0446 05/01/13 0543  WBC 6.0 7.1 5.7 8.5  NEUTROABS 4.3  --   --   --   HGB 13.8 11.8* 12.2 11.9*  HCT 42.2 36.0 37.5 36.7  MCV 90.0 88.9 90.1 90.0  PLT 234 202 190 193   Cardiac Enzymes:  Recent Labs Lab 04/28/13 1904 04/29/13 0038 04/29/13 0645  TROPONINI <0.30 <0.30 <0.30   BNP (last 3 results) No results found for this basename: PROBNP,  in the last 8760 hours CBG: No results found for this basename: GLUCAP,  in the last 168 hours  Recent Results (from the past 240 hour(s))  URINE CULTURE     Status: None   Collection Time    04/28/13 10:23 PM      Result Value Ref Range Status  Specimen Description URINE, CATHETERIZED   Final   Special Requests NONE   Final   Culture  Setup Time     Final   Value: 04/28/2013 23:53     Performed at [facility]     Final   Value: NO GROWTH     Performed at [facility]   Culture     Final   Value: NO GROWTH     Performed at [facility]   Report Status 04/30/2013 FINAL   Final  MRSA PCR SCREENING     Status: None   Collection Time    04/29/13  5:38 AM      Result Value Ref Range Status   MRSA by PCR NEGATIVE  NEGATIVE Final   Comment:            The GeneXpert MRSA Assay (FDA     approved for NASAL specimens      only), is one component of a     comprehensive MRSA colonization     surveillance program. It is not     intended to diagnose MRSA     infection nor to guide or     monitor treatment for     MRSA infections.     Studies: Dg Pelvis Portable  04/30/2013   CLINICAL DATA:  Fixation of periprosthetic fracture.  EXAM: PORTABLE PELVIS 1-2 VIEWS  COMPARISON:  CT scan left hip 04/28/2013.  FINDINGS: Bipolar left hip hemiarthroplasty is in place. Four new cerclage wires are seen about the femur for fixation of a periprosthetic fracture. Hardware is intact. No new abnormality. Position and alignment are anatomic.  IMPRESSION: Fixation of periprosthetic left femur fracture. No acute abnormality.   Electronically Signed   By: Inge Rise M.D.   On: 04/30/2013 20:13    Scheduled Meds: . calcium-vitamin D  1 tablet Oral Daily  . docusate sodium  100 mg Oral BID  . erythromycin  250 mg Oral BID WC  . flecainide  50 mg Oral BID  . hydrochlorothiazide  25 mg Oral Daily  . multivitamin  1 tablet Oral Daily  . pantoprazole  40 mg Oral BID AC  . vitamin B-12  1,000 mcg Oral Daily  . Warfarin - Pharmacist Dosing Inpatient   Does not apply q1800   Continuous Infusions: . sodium chloride 0.9 % 1,000 mL with potassium chloride 10 mEq infusion 50 mL/hr at 04/30/13 2220    Principal Problem:   Closed left hip fracture Active Problems:   Hypertension   Hyperlipidemia   Atrial fibrillation   Warfarin anticoagulation   Hip fracture    Time spent: 25 minutes    [facility] Pager [phone]. If 7PM-7AM, please contact night-coverage at [email or web address], password [facility] 05/01/2013, 1:38 PM  LOS: 3 days

## 2013-05-01 NOTE — Progress Notes (Signed)
Orthopedics Progress Note  Subjective: I cant move my knee.  Objective:  Filed Vitals:   05/01/13 0500  BP: 135/74  Pulse: 69  Temp: 98.1 F (36.7 C)  Resp: 18    General: Awake and alert  Musculoskeletal: left hip incision clean and dry, min swelling Neurovascularly intact  Lab Results  Component Value Date   WBC 8.5 05/01/2013   HGB 11.9* 05/01/2013   HCT 36.7 05/01/2013   MCV 90.0 05/01/2013   PLT 193 05/01/2013       Component Value Date/Time   NA 140 05/01/2013 0543   K 4.1 05/01/2013 0543   CL 102 05/01/2013 0543   CO2 24 05/01/2013 0543   GLUCOSE 84 05/01/2013 0543   BUN 11 05/01/2013 0543   CREATININE 0.62 05/01/2013 0543   CALCIUM 8.9 05/01/2013 0543   GFRNONAA 83* 05/01/2013 0543   GFRAA >90 05/01/2013 0543    Lab Results  Component Value Date   INR 1.55* 05/01/2013   INR 1.51* 04/30/2013   INR 1.64* 04/28/2013    Assessment/Plan: POD #1 s/p Procedure(s): OPEN REDUCTION INTERNAL FIXATION (ORIF) PERIPROSTHETIC FRACTURE Stable overnight, D/C planning DVT prophylaxis  Remo Lipps R. Veverly Fells, MD 05/01/2013 10:22 AM

## 2013-05-02 LAB — CBC
HCT: 35.7 % — ABNORMAL LOW (ref 36.0–46.0)
Hemoglobin: 11.6 g/dL — ABNORMAL LOW (ref 12.0–15.0)
MCH: 29.2 pg (ref 26.0–34.0)
MCHC: 32.5 g/dL (ref 30.0–36.0)
MCV: 89.9 fL (ref 78.0–100.0)
Platelets: 197 10*3/uL (ref 150–400)
RBC: 3.97 MIL/uL (ref 3.87–5.11)
RDW: 14.3 % (ref 11.5–15.5)
WBC: 10.1 10*3/uL (ref 4.0–10.5)

## 2013-05-02 LAB — PROTIME-INR
INR: 2.81 — ABNORMAL HIGH (ref 0.00–1.49)
Prothrombin Time: 28.6 seconds — ABNORMAL HIGH (ref 11.6–15.2)

## 2013-05-02 LAB — BASIC METABOLIC PANEL
BUN: 8 mg/dL (ref 6–23)
CO2: 26 mEq/L (ref 19–32)
Calcium: 9 mg/dL (ref 8.4–10.5)
Chloride: 99 mEq/L (ref 96–112)
Creatinine, Ser: 0.64 mg/dL (ref 0.50–1.10)
GFR calc Af Amer: >90 mL/min (ref >90)
GFR calc non Af Amer: 82 mL/min — ABNORMAL LOW (ref >90)
Glucose, Bld: 100 mg/dL — ABNORMAL HIGH (ref 70–99)
Potassium: 3.8 mEq/L (ref 3.7–5.3)
Sodium: 139 mEq/L (ref 137–147)

## 2013-05-02 MED ORDER — POLYETHYLENE GLYCOL 3350 17 G PO PACK
17.0000 g | PACK | Freq: Every day | ORAL | Status: DC
  Administered 2013-05-03: 17 g via ORAL
  Filled 2013-05-02 (×3): qty 1

## 2013-05-02 MED ORDER — LACTULOSE 10 GM/15ML PO SOLN
20.0000 g | Freq: Once | ORAL | Status: AC
  Administered 2013-05-02: 20 g via ORAL
  Filled 2013-05-02: qty 30

## 2013-05-02 NOTE — Progress Notes (Signed)
[facility] PROGRESS NOTE  [name] NTI:[id number] DOB: 1930/10/29 DOA: 04/28/2013 PCP: Gloria Lopes, MD  Assessment/Plan: 1. Left proximal femoral fracture -Status post mechanical fall at home. -CT scan showing undisplaced fracture surrounding the femoral prosthesis on the left just below the lesser trochanter  -S/P ORIF on 04/30/13, tolerated procedure well no complications.   2. Atrial fibrillation -Remains rate controlled -Continue flecainide 50 mg by mouth twice a day -Had been on Coumadin therapy, reversed for procedure  3. Anticoagulation -Coumadin reversed for procedure -Restarted, INR therapeutic, pharmacy following  4. Hypertension -Hydrochlorothiazide held -Blood pressure stable   Code Status: Full code  Family Communication:  Disposition Plan: Anticipate d/c to SNF on Monday if she remains stable   Consultants:  Orthopedic surgery  Procedures:  Plan for ORIF on 04/29/2013  Antibiotics:    HPI/Subjective: Patient is a pleasant 80 year old female present residing in the community, was admitted to the medicine service on 04/28/2013 presenting to the emergency room after having a fall on her driveway. She states that she was doing yardwork, turned around lost her balance and fell. Imaging studies on presentation showed left proximal femoral fracture.  Patient tolerating PO, reports pain controlled.   Objective: Filed Vitals:   05/02/13 1132  BP:   Pulse:   Temp:   Resp: 18    Intake/Output Summary (Last 24 hours) at 05/02/13 1440 Last data filed at 05/02/13 1344  Gross per 24 hour  Intake    960 ml  Output   3350 ml  Net  -2390 ml   Filed Weights   04/28/13 2024  Weight: 69.1 kg (152 lb 5.4 oz)    Exam:   General:  Patient is in no acute distress awake alert   Cardiovascular: Regular rate and rhythm normal S1-S2   Respiratory: Clear to auscultation bilaterally no wheezing rhonchi or rales   Abdomen: Soft nontender  nondistended   Musculoskeletal: Left lower extremity mildly swollen, surgical incision site appears clean, no bleed.   Data Reviewed: Basic Metabolic Panel:  Recent Labs Lab 04/28/13 1529 04/29/13 0420 05/01/13 0543 05/02/13 0530  NA 143 141 140 139  K 4.0 3.9 4.1 3.8  CL 104 106 102 99  CO2 23 22 24 26   GLUCOSE 129* 89 84 100*  BUN 22 16 11 8   CREATININE 0.73 0.70 0.62 0.64  CALCIUM 9.5 8.5 8.9 9.0   Liver Function Tests:  Recent Labs Lab 04/28/13 1529  AST 23  ALT 16  ALKPHOS 120*  BILITOT 0.6  PROT 6.7  ALBUMIN 3.8   No results found for this basename: LIPASE, AMYLASE,  in the last 168 hours No results found for this basename: AMMONIA,  in the last 168 hours CBC:  Recent Labs Lab 04/28/13 1529 04/29/13 0420 04/30/13 0446 05/01/13 0543 05/02/13 0530  WBC 6.0 7.1 5.7 8.5 10.1  NEUTROABS 4.3  --   --   --   --   HGB 13.8 11.8* 12.2 11.9* 11.6*  HCT 42.2 36.0 37.5 36.7 35.7*  MCV 90.0 88.9 90.1 90.0 89.9  PLT 234 202 190 193 197   Cardiac Enzymes:  Recent Labs Lab 04/28/13 1904 04/29/13 0038 04/29/13 0645  TROPONINI <0.30 <0.30 <0.30   BNP (last 3 results) No results found for this basename: PROBNP,  in the last 8760 hours CBG: No results found for this basename: GLUCAP,  in the last 168 hours  Recent Results (from the past 240 hour(s))  URINE CULTURE     Status: None  Collection Time    04/28/13 10:23 PM      Result Value Ref Range Status   Specimen Description URINE, CATHETERIZED   Final   Special Requests NONE   Final   Culture  Setup Time     Final   Value: 04/28/2013 23:53     Performed at [facility]     Final   Value: NO GROWTH     Performed at [facility]   Culture     Final   Value: NO GROWTH     Performed at [facility]   Report Status 04/30/2013 FINAL   Final  MRSA PCR SCREENING     Status: None   Collection Time    04/29/13  5:38 AM      Result Value Ref Range Status   MRSA by  PCR NEGATIVE  NEGATIVE Final   Comment:            The GeneXpert MRSA Assay (FDA     approved for NASAL specimens     only), is one component of a     comprehensive MRSA colonization     surveillance program. It is not     intended to diagnose MRSA     infection nor to guide or     monitor treatment for     MRSA infections.     Studies: Dg Pelvis Portable  04/30/2013   CLINICAL DATA:  Fixation of periprosthetic fracture.  EXAM: PORTABLE PELVIS 1-2 VIEWS  COMPARISON:  CT scan left hip 04/28/2013.  FINDINGS: Bipolar left hip hemiarthroplasty is in place. Four new cerclage wires are seen about the femur for fixation of a periprosthetic fracture. Hardware is intact. No new abnormality. Position and alignment are anatomic.  IMPRESSION: Fixation of periprosthetic left femur fracture. No acute abnormality.   Electronically Signed   By: Gloria [name] M.D.   On: 04/30/2013 20:13    Scheduled Meds: . calcium-vitamin D  1 tablet Oral Daily  . docusate sodium  100 mg Oral BID  . erythromycin  250 mg Oral BID WC  . flecainide  50 mg Oral BID  . hydrochlorothiazide  25 mg Oral Daily  . lactulose  20 g Oral Once  . multivitamin  1 tablet Oral Daily  . pantoprazole  40 mg Oral BID AC  . polyethylene glycol  17 g Oral Daily  . vitamin B-12  1,000 mcg Oral Daily  . Warfarin - Pharmacist Dosing Inpatient   Does not apply q1800   Continuous Infusions:    Principal Problem:   Closed left hip fracture Active Problems:   Hypertension   Hyperlipidemia   Atrial fibrillation   Warfarin anticoagulation   Hip fracture    Time spent: 25 minutes    [facility] Pager [phone]. If 7PM-7AM, please contact night-coverage at [email or web address], password [facility] 05/02/2013, 2:40 PM  LOS: 4 days

## 2013-05-02 NOTE — Progress Notes (Addendum)
Pt complaining of some increased swelling and pressure in her left knee this morning.  States she feels these symptoms might just be happening because she can't bend her knee.  Otherwise, pt states pain is much improved this am. Pt also tolerating adequate PO's and has no c/o n&V so IVF was d/c per transcribed order.

## 2013-05-02 NOTE — Progress Notes (Signed)
    Subjective: 2 Days Post-Op Procedure(s) (LRB): OPEN REDUCTION INTERNAL FIXATION (ORIF) PERIPROSTHETIC FRACTURE (Left) Patient reports pain as 4 on 0-10 scale.   Denies CP or SOB.  Voiding without difficulty. Positive flatus. Objective: Vital signs in last 24 hours: Temp:  [97.8 F (36.6 C)-99.7 F (37.6 C)] 98.1 F (36.7 C) (04/26 0623) Pulse Rate:  [59-81] 81 (04/26 0424) Resp:  [18-20] 18 (04/26 0728) BP: (118-156)/(47-64) 138/64 mmHg (04/26 0424) SpO2:  [65 %-97 %] 96 % (04/26 0728)  Intake/Output from previous day: 04/25 0701 - 04/26 0700 In: 960 [P.O.:960] Out: 2100 [Urine:2100] Intake/Output this shift: Total I/O In: 240 [P.O.:240] Out: -   Labs:  Recent Labs  04/30/13 0446 05/01/13 0543 05/02/13 0530  HGB 12.2 11.9* 11.6*    Recent Labs  05/01/13 0543 05/02/13 0530  WBC 8.5 10.1  RBC 4.08 3.97  HCT 36.7 35.7*  PLT 193 197    Recent Labs  05/01/13 0543 05/02/13 0530  NA 140 139  K 4.1 3.8  CL 102 99  CO2 24 26  BUN 11 8  CREATININE 0.62 0.64  GLUCOSE 84 100*  CALCIUM 8.9 9.0    Recent Labs  05/01/13 0543 05/02/13 0530  INR 1.55* 2.81*    Physical Exam: Neurologically intact Intact pulses distally Incision: dressing C/D/I Compartment soft  Assessment/Plan: 2 Days Post-Op Procedure(s) (LRB): OPEN REDUCTION INTERNAL FIXATION (ORIF) PERIPROSTHETIC FRACTURE (Left) Advance diet Up with therapy Plan per Dr Hilbert Corrigan for Dr. Melina Schools [facility] [phone] 05/02/2013, 10:01 AM

## 2013-05-02 NOTE — Progress Notes (Signed)
ANTICOAGULATION CONSULT NOTE - Follow Up Consult  Pharmacy Consult for Coumadin Indication: atrial fibrillation and VTE prophylaxis  Allergies  Allergen Reactions  . Amoxicillin Other (See Comments)    unknown  . Cephalexin Other (See Comments)    unknown  . Codeine Other (See Comments)    unknown  . Metoclopramide Hcl Other (See Comments)    unknown    Patient Measurements: Height: 5\' 5"  (165.1 cm) Weight: 152 lb 5.4 oz (69.1 kg) IBW/kg (Calculated) : 57  Vital Signs: Temp: 98.1 F (36.7 C) (04/26 0623) Temp src: Oral (04/26 0623) BP: 138/64 mmHg (04/26 0424) Pulse Rate: 81 (04/26 0424)  Labs:  Recent Labs  04/29/13 1307  04/30/13 0446 04/30/13 1727 05/01/13 0543 05/02/13 0530  HGB  --   < > 12.2  --  11.9* 11.6*  HCT  --   --  37.5  --  36.7 35.7*  PLT  --   --  190  --  193 197  APTT  --   --   --  73*  --   --   LABPROT  --   --   --  17.8* 18.2* 28.6*  INR  --   --   --  1.51* 1.55* 2.81*  HEPARINUNFRC 0.34  --  0.47  --   --   --   CREATININE  --   --   --   --  0.62 0.64  < > = values in this interval not displayed.  Estimated Creatinine Clearance: 54.7 ml/min (by C-G formula based on Cr of 0.64).   Assessment:  80 yo F on Coumadin PTA for history of Afib admitted after sustaining a fall. She is now s/p ORIF L hip fracture. Pharmacy consulted to resume Coumadin post-op on 4/24. INR with rapid rise overnight (INR 1.5 > 2.8).  No bleeding noted.  Will hold Coumadin dose tonight.  Home dose is 6 mg daily except 4 mg on Tues and Sat.  Goal of Therapy:  INR 2-3 Monitor platelets by anticoagulation protocol: Yes   Plan:  - No Coumadin tonight - INR daily - Monitor for s/sx of bleeding  [employer], Pharm.D., BCPS Clinical Pharmacist Pager [phone] 05/02/2013 12:52 PM

## 2013-05-02 NOTE — Op Note (Signed)
NAMEARIYAN, Gloria [name]              ACCOUNT NO.:  [id number]  MEDICAL RECORD NO.:  [id number]  LOCATION:  2W29C                        FACILITY:  [facility]  PHYSICIAN:  Pietro Cassis. Alvan Dame, M.D.  DATE OF BIRTH:  11/14/1930  DATE OF PROCEDURE:  04/30/2013 DATE OF DISCHARGE:                              OPERATIVE REPORT   PREOPERATIVE DIAGNOSIS:  Left periprosthetic femur fracture classified as type B pattern with concern for extension to the tip of the stem.  POSTOPERATIVE DIAGNOSIS:  Left periprosthetic femur fracture classified as type B pattern with concern for extension to the tip of the stem.  PROCEDURE:  Open reduction and internal fixation of left periprosthetic proximal femur fracture utilizing Synthes cables x4.  SURGEON:  Pietro Cassis. Alvan Dame, MD  ASSISTANT:  Surgical team.  ANESTHESIA:  General.  SPECIMENS:  None.  COMPLICATION:  None.  BLOOD LOSS:  Less than 100 mL.  INDICATIONS FOR THE PROCEDURE:  Ms. Spiewak is a pleasant 80 year old female with a history of left hip hemiarthroplasty femoral neck fracture.  She has also had a right total knee arthroplasty.  She was out doing work in her yard when she stumbled and fell onto her left side.  She had immediate onset of pain.  She was brought to the emergency room, where radiographs revealed a fracture to the proximal femur confirmed with CT evaluation for initial consultation to Orthopedics.  She is also admitted to the Medical Service per our fracture pathway and Orthopedics was consult for definitive management.  One of my partners was on-call and performed initial assessment then contacted me for definitive care.  I had a chance to review with her the fracture pattern and the treatment plan based on the radiographic pattern visualized.  Risks and benefits of nonunion, malunion, potential of further fracture extension, need for future surgery were all reviewed and discussed.  Consent was obtained for benefit of pain  relief.  PROCEDURE IN DETAIL:  Patient was brought to the operative theater. Once adequate anesthesia, preoperative antibiotics, Cleocin administered, she was positioned into the right lateral decubitus position, left side up.  The left lower extremity was then prepped and draped in sterile fashion.  A time-out was performed identifying the patient, planned procedure, and extremity.  A lateral based incision was utilized, posterior approach to the hip was slightly more transverse along the posterior aspect of the hip.  I extended my incision more proximal and lateral.  Soft tissue dissection carried down to the iliotibial band.  This was then incised so the vastus lateralis could be elevated off the posterior intramuscular septum and retracted anteriorly.  Once this was elevated and retractor was placed, I began by placing cables based on our preoperative assessment, I wanted to place 3 cables, proximal one at the distal portion near the tip of the stem to prevent progression of any fracture based on the pre-operative identification radiographically.  Once all cables were passed, they were tensioned appropriately, crimped, and cut.  I irrigated the wound, replaced the vastus lateralis over the lateral femur, then reapproximated iliotibial band and gluteal fascia with #1 Vicryl, #0 V-Loc sutures.  The remaining wound was closed with 2-0 Vicryl and staples on the  skin.  The skin was cleaned, dried, and dressed sterilely using a long Mepilex dressing.  Following the procedure, she was brought to the recovery room in stable condition, tolerating the procedure well.  I will plan to have her partial weightbearing for probably 6 weeks to allow for bone healing.  I will want to see her back in the office in 2 weeks for follow up evaluation.     Pietro Cassis Alvan Dame, M.D.     MDO/MEDQ  D:  05/01/2013  T:  05/01/2013  Job:  [id number]

## 2013-05-03 ENCOUNTER — Inpatient Hospital Stay (HOSPITAL_COMMUNITY): Payer: Medicare Other

## 2013-05-03 DIAGNOSIS — M25569 Pain in unspecified knee: Secondary | ICD-10-CM

## 2013-05-03 DIAGNOSIS — K5900 Constipation, unspecified: Secondary | ICD-10-CM

## 2013-05-03 LAB — CBC
HCT: 35.5 % — ABNORMAL LOW (ref 36.0–46.0)
Hemoglobin: 11.7 g/dL — ABNORMAL LOW (ref 12.0–15.0)
MCH: 29.6 pg (ref 26.0–34.0)
MCHC: 33 g/dL (ref 30.0–36.0)
MCV: 89.9 fL (ref 78.0–100.0)
Platelets: 195 10*3/uL (ref 150–400)
RBC: 3.95 MIL/uL (ref 3.87–5.11)
RDW: 14.3 % (ref 11.5–15.5)
WBC: 9.5 10*3/uL (ref 4.0–10.5)

## 2013-05-03 LAB — PROTIME-INR
INR: 2.33 — ABNORMAL HIGH (ref 0.00–1.49)
Prothrombin Time: 24.8 seconds — ABNORMAL HIGH (ref 11.6–15.2)

## 2013-05-03 MED ORDER — WARFARIN SODIUM 6 MG PO TABS
6.0000 mg | ORAL_TABLET | Freq: Once | ORAL | Status: AC
  Administered 2013-05-03: 6 mg via ORAL
  Filled 2013-05-03: qty 1

## 2013-05-03 MED ORDER — HYDROCODONE-ACETAMINOPHEN 5-325 MG PO TABS: 1.0000 | ORAL_TABLET | Freq: Four times a day (QID) | ORAL | Status: DC | PRN

## 2013-05-03 MED ORDER — WARFARIN SODIUM 5 MG PO TABS: 5.0000 mg | ORAL_TABLET | Freq: Every day | ORAL | Status: DC

## 2013-05-03 MED ORDER — MILK AND MOLASSES ENEMA
1.0000 | Freq: Once | RECTAL | Status: AC
  Administered 2013-05-03: 250 mL via RECTAL
  Filled 2013-05-03: qty 250

## 2013-05-03 NOTE — Evaluation (Signed)
Occupational Therapy Evaluation Patient Details Name: Gloria [name] MRN: [id number] DOB: 01/14/1930 Today's Date: 05/03/2013    History of Present Illness Pt admit after fall with left proximal femur fracture with ORIF.  Pt with hx of lt and rt THA and rt TKA.   Clinical Impression   Patient is s/p ORIF LT hip surgery resulting in functional limitations due to the deficits listed below (see OT problem list).  Patient will benefit from skilled OT acutely to increase independence and safety with ADLS to allow discharge SNF . Pt demonstrates decr balance and decr activity tolerance     Follow Up Recommendations  SNF    Equipment Recommendations  Other (comment) (defer snf)    Recommendations for Other Services       Precautions / Restrictions Precautions Precautions: Fall Restrictions Weight Bearing Restrictions: Yes LLE Weight Bearing: Partial weight bearing      Mobility Bed Mobility Overal bed mobility: Needs Assistance Bed Mobility: Sit to Supine     Supine to sit: Min assist Sit to supine: Mod assist   General bed mobility comments: pt requires (A) with LT LE  Transfers Overall transfer level: Needs assistance Equipment used: Rolling walker (2 wheeled) Transfers: Sit to/from Stand Sit to Stand: Mod assist         General transfer comment: (A) to shift weight anteriorly for transfer    Balance   Sitting-balance support: Bilateral upper extremity supported;Feet supported Sitting balance-Leahy Scale: Fair   Postural control: Posterior lean Standing balance support: Bilateral upper extremity supported Standing balance-Leahy Scale: Poor Standing balance comment: walker for support                            ADL Overall ADL's : Needs assistance/impaired     Grooming: Wash/dry face;Oral care;Wash/dry hands;Min guard;Standing Grooming Details (indicate cue type and reason): posterior lean and attempting to maintain balance on RT LE                  Toilet Transfer: Minimal assistance;Regular Toilet;RW;Grab bars Toilet Transfer Details (indicate cue type and reason): reports difficulty voiding bowels Toileting- Clothing Manipulation and Hygiene: Minimal assistance;Sit to/from stand       Functional mobility during ADLs: Minimal assistance;Rolling walker General ADL Comments: Pt pleasant and agreeable to OT session. pt completed toilet transfer, basic transfer from chair, sink level grooming and bed mobility. Pt demonstrates balance deficits and decr activity tolerance.     Vision                     Perception Perception Perception Tested?: No   Praxis Praxis Praxis tested?: Not tested    Pertinent Vitals/Pain No pain reported Reports fatigue and returned to supine     Hand Dominance Right   Extremity/Trunk Assessment Upper Extremity Assessment Upper Extremity Assessment: Overall WFL for tasks assessed   Lower Extremity Assessment Lower Extremity Assessment: Defer to PT evaluation   Cervical / Trunk Assessment Cervical / Trunk Assessment: Normal   Communication Communication Communication: No difficulties   Cognition Arousal/Alertness: Awake/alert Behavior During Therapy: WFL for tasks assessed/performed Overall Cognitive Status: Within Functional Limits for tasks assessed                     General Comments       Exercises       Shoulder Instructions      Home Living Family/patient expects to be discharged to:: Skilled nursing  facility   Available Help at Discharge: [place]                                    Prior Functioning/Environment Level of Independence: Independent        Comments: Pt drives and does gardening.  Was actually gardening when she fell    OT Diagnosis: Generalized weakness;Acute pain   OT Problem List: Decreased strength;Decreased activity tolerance;Impaired balance (sitting and/or standing);Decreased  safety awareness;Decreased knowledge of use of DME or AE;Decreased knowledge of precautions;Pain   OT Treatment/Interventions: Self-care/ADL training;Therapeutic exercise;DME and/or AE instruction;Therapeutic activities;Patient/family education;Balance training    OT Goals(Current goals can be found in the care plan section) Acute Rehab OT Goals Patient Stated Goal: to go to SNF at [facility] OT Goal Formulation: With patient Time For Goal Achievement: 05/17/13 Potential to Achieve Goals: Good  OT Frequency: Min 2X/week   Barriers to D/C:            Co-evaluation              End of Session Equipment Utilized During Treatment: Gait belt;Rolling walker Nurse Communication: Mobility status;Precautions  Activity Tolerance: Patient tolerated treatment well Patient left: in bed;with call bell/phone within reach   Time: 1310-1324 OT Time Calculation (min): 14 min Charges:  OT General Charges $OT Visit: 1 Procedure OT Evaluation $Initial OT Evaluation Tier I: 1 Procedure OT Treatments $Self Care/Home Management : 8-22 mins G-Codes:    Peri Maris 2013/05/19, 2:42 PM Pager: [phone]

## 2013-05-03 NOTE — Progress Notes (Signed)
Pt given milk and molasses enema this afternoon.  Pt subsequently had several small BM's immediately after.

## 2013-05-03 NOTE — Progress Notes (Signed)
Clinical Social Work Department CLINICAL SOCIAL WORK PLACEMENT NOTE 05/03/2013  Patient:  Gloria [name], Gloria [name]  Account Number:  [id number] Admit date:  04/28/2013  Clinical Social Worker:  Megan Salon  Date/time:  05/03/2013 01:10 PM  Clinical Social Work is seeking post-discharge placement for this patient at the following level of care:   SKILLED NURSING   (*CSW will update this form in Epic as items are completed)   05/03/2013  Patient/family provided with [place] Department of Clinical Social Work's list of facilities offering this level of care within the geographic area requested by the patient (or if unable, by the patient's family).  05/03/2013  Patient/family informed of their freedom to choose among providers that offer the needed level of care, that participate in Medicare, Medicaid or managed care program needed by the patient, have an available bed and are willing to accept the patient.  05/03/2013  Patient/family informed of [facility]' ownership interest in [facility], as well as of the fact that they are under no obligation to receive care at this facility.  PASARR submitted to EDS on 05/03/2013 PASARR number received from EDS on 05/03/2013  FL2 transmitted to all facilities in geographic area requested by pt/family on  05/03/2013 FL2 transmitted to all facilities within larger geographic area on   Patient informed that his/her managed care company has contracts with or will negotiate with  certain facilities, including the following:     Patient/family informed of bed offers received:   Patient chooses bed at  Physician recommends and patient chooses bed at    Patient to be transferred to  on   Patient to be transferred to facility by   The following physician request were entered in Epic:   Additional Comments:  Jeanette Caprice, MSW, [place]

## 2013-05-03 NOTE — Progress Notes (Signed)
Pharmacy Note-Anticoagulation  Pharmacy Consult :  80 y.o. female is currently on Coumadin for atrial fibrillation, VTE prophylaxis .   Latest Labs : Hematology :  Recent Labs  04/30/13 1727 05/01/13 0543 05/02/13 0530 05/03/13 0350  HGB  --  11.9* 11.6* 11.7*  HCT  --  36.7 35.7* 35.5*  PLT  --  193 197 195  APTT 73*  --   --   --   LABPROT 17.8* 18.2* 28.6* 24.8*  INR 1.51* 1.55* 2.81* 2.33*  CREATININE  --  0.62 0.64  --    Current Medication[s] Include: Medication PTA: Prescriptions prior to admission  Medication Sig Dispense Refill  . Calcium Carb-Cholecalciferol (CALCIUM 600 + D PO) Take 1 tablet by mouth 2 (two) times daily.      Marland Kitchen erythromycin (E-MYCIN) 250 MG tablet Take 1 tablet by mouth Twice daily.      . flecainide (TAMBOCOR) 50 MG tablet Take 1 tablet (50 mg total) by mouth 2 (two) times daily.  180 tablet  1  . hydrochlorothiazide (HYDRODIURIL) 25 MG tablet Take 1 tablet (25 mg total) by mouth daily.  90 tablet  4  . Multiple Vitamins-Minerals (ICAPS MV PO) Take 2 capsules by mouth daily.       Marland Kitchen omeprazole (PRILOSEC OTC) 20 MG tablet Take 20 mg by mouth 2 (two) times daily.      . vitamin B-12 (CYANOCOBALAMIN) 1000 MCG tablet Take 1,000 mcg by mouth daily.      Marland Kitchen warfarin (COUMADIN) 4 MG tablet Take 4-6 mg by mouth daily. Takes 4 mg on Tuesday and Saturday and 6 mg all other days       Scheduled:  Scheduled:  . calcium-vitamin D  1 tablet Oral Daily  . docusate sodium  100 mg Oral BID  . erythromycin  250 mg Oral BID WC  . flecainide  50 mg Oral BID  . hydrochlorothiazide  25 mg Oral Daily  . milk and molasses  1 enema Rectal Once  . multivitamin  1 tablet Oral Daily  . pantoprazole  40 mg Oral BID AC  . polyethylene glycol  17 g Oral Daily  . vitamin B-12  1,000 mcg Oral Daily  . Warfarin - Pharmacist Dosing Inpatient   Does not apply q1800   Infusion[s]: Infusions:   Antibiotic[s]: Anti-infectives   Start     Dose/Rate Route Frequency Ordered Stop    05/01/13 0730  erythromycin (E-MYCIN) tablet 250 mg     250 mg Oral 2 times daily with meals 04/30/13 2151        Assessment :  Today's INR 2.33 following no Coumadin yesterday.   INR is within the therapeutic range..    No evidence of bleeding complications observed.  Goal :  INR goal is 2-3  Plan : 1. Coumadin 6 mg today. 2. Daily INR's, CBC. Monitor for bleeding complications.   Follow Platelet counts.  Estelle June, Pharm.D. 05/03/2013  11:56 AM

## 2013-05-03 NOTE — Progress Notes (Signed)
Clinical Social Work Department BRIEF PSYCHOSOCIAL ASSESSMENT 05/03/2013  Patient:  Gloria [name], Gloria [name]     Account Number:  [id number]     Admit date:  04/28/2013  Clinical Social Worker:  Megan Salon  Date/Time:  05/03/2013 01:05 PM  Referred by:  Physician  Date Referred:  05/03/2013 Referred for  SNF Placement   Other Referral:   Interview type:  Patient Other interview type:    PSYCHOSOCIAL DATA Living Status:  HUSBAND Admitted from facility:   Level of care:   Primary support name:  Anson [place] Primary support relationship to patient:  SPOUSE Degree of support available:   Good    CURRENT CONCERNS Current Concerns  Post-Acute Placement   Other Concerns:    SOCIAL WORK ASSESSMENT / PLAN Clinical Social Worker received referral for SNF placement at d/c. CSW introduced self and explained reason for visit. CSW explained SNF process to patient. Patient reported she is agreeable for SNF placement and has been to [facility] before and "loved their therapy."  CSW called [facility] and they stated they are able to make a bed offer. Patient was very happy about this and stated she was going to call her family.   Assessment/plan status:  Psychosocial Support/Ongoing Assessment of Needs Other assessment/ plan:   Information/referral to community resources:   CSW information    PATIENT'S/FAMILY'S RESPONSE TO PLAN OF CARE: Patient states she is agreeable to SNF placement.        Jeanette Caprice, MSW, [place]

## 2013-05-03 NOTE — Progress Notes (Signed)
[facility] PROGRESS NOTE  ZANNA HAWN XIP:[id number] DOB: 12-17-1930 DOA: 04/28/2013 PCP: [name], MD  Assessment/Plan: 1. Left proximal femoral fracture -Status post mechanical fall at home. -CT scan showing undisplaced fracture surrounding the femoral prosthesis on the left just below the lesser trochanter  -S/P ORIF on 04/30/13, tolerated procedure well no complications.   2. Atrial fibrillation -Remains rate controlled -Continue flecainide 50 mg by mouth twice a day -Had been on Coumadin therapy, reversed for procedure  3. Anticoagulation -Coumadin reversed for procedure -Restarted, INR therapeutic, pharmacy following  4. Hypertension -Hydrochlorothiazide held -Blood pressure stable  5.  Left knee Pain -2 view x-ray showing arthritis, no fracture of dislocation  6. Constipation -Patient given colace, miralax and lactulose with no results. Will give enema today.  Code Status: Full code  Family Communication:  Disposition Plan: Anticipate d/c to SNF on Monday if she remains stable   Consultants:  Orthopedic surgery  Procedures:  Plan for ORIF on 04/29/2013  Antibiotics:    HPI/Subjective: Patient is a pleasant 80 year old female present residing in the community, was admitted to the medicine service on 04/28/2013 presenting to the emergency room after having a fall on her driveway. She states that she was doing yardwork, turned around lost her balance and fell. Imaging studies on presentation showed left proximal femoral fracture.  Patient tolerating PO, reports pain controlled, remains constipated.   Objective: Filed Vitals:   05/03/13 0800  BP:   Pulse:   Temp:   Resp: 18    Intake/Output Summary (Last 24 hours) at 05/03/13 1622 Last data filed at 05/03/13 1130  Gross per 24 hour  Intake    720 ml  Output    950 ml  Net   -230 ml   Filed Weights   04/28/13 2024  Weight: 69.1 kg (152 lb 5.4 oz)    Exam:   General:   Patient is in no acute distress awake alert   Cardiovascular: Regular rate and rhythm normal S1-S2   Respiratory: Clear to auscultation bilaterally no wheezing rhonchi or rales   Abdomen: Soft nontender nondistended   Musculoskeletal: Left lower extremity mildly swollen, surgical incision site appears clean, no bleed.   Data Reviewed: Basic Metabolic Panel:  Recent Labs Lab 04/28/13 1529 04/29/13 0420 05/01/13 0543 05/02/13 0530  NA 143 141 140 139  K 4.0 3.9 4.1 3.8  CL 104 106 102 99  CO2 23 22 24 26   GLUCOSE 129* 89 84 100*  BUN 22 16 11 8   CREATININE 0.73 0.70 0.62 0.64  CALCIUM 9.5 8.5 8.9 9.0   Liver Function Tests:  Recent Labs Lab 04/28/13 1529  AST 23  ALT 16  ALKPHOS 120*  BILITOT 0.6  PROT 6.7  ALBUMIN 3.8   No results found for this basename: LIPASE, AMYLASE,  in the last 168 hours No results found for this basename: AMMONIA,  in the last 168 hours CBC:  Recent Labs Lab 04/28/13 1529 04/29/13 0420 04/30/13 0446 05/01/13 0543 05/02/13 0530 05/03/13 0350  WBC 6.0 7.1 5.7 8.5 10.1 9.5  NEUTROABS 4.3  --   --   --   --   --   HGB 13.8 11.8* 12.2 11.9* 11.6* 11.7*  HCT 42.2 36.0 37.5 36.7 35.7* 35.5*  MCV 90.0 88.9 90.1 90.0 89.9 89.9  PLT 234 202 190 193 197 195   Cardiac Enzymes:  Recent Labs Lab 04/28/13 1904 04/29/13 0038 04/29/13 0645  TROPONINI <0.30 <0.30 <0.30   BNP (last 3 results)  No results found for this basename: PROBNP,  in the last 8760 hours CBG: No results found for this basename: GLUCAP,  in the last 168 hours  Recent Results (from the past 240 hour(s))  URINE CULTURE     Status: None   Collection Time    04/28/13 10:23 PM      Result Value Ref Range Status   Specimen Description URINE, CATHETERIZED   Final   Special Requests NONE   Final   Culture  Setup Time     Final   Value: 04/28/2013 23:53     Performed at [facility]     Final   Value: NO GROWTH     Performed at [facility]   Culture     Final   Value: NO GROWTH     Performed at [facility]   Report Status 04/30/2013 FINAL   Final  MRSA PCR SCREENING     Status: None   Collection Time    04/29/13  5:38 AM      Result Value Ref Range Status   MRSA by PCR NEGATIVE  NEGATIVE Final   Comment:            The GeneXpert MRSA Assay (FDA     approved for NASAL specimens     only), is one component of a     comprehensive MRSA colonization     surveillance program. It is not     intended to diagnose MRSA     infection nor to guide or     monitor treatment for     MRSA infections.     Studies: Dg Knee 1-2 Views Left  May 05, 2013   CLINICAL DATA:  Recent surgery for periprosthetic fracture.  EXAM: LEFT KNEE - 1-2 VIEW  COMPARISON:  None.  FINDINGS: There is no evidence of fracture, dislocation, or joint effusion. There is tricompartment joint space narrowing, marginal spur formation and subchondral sclerosis. Mild chondrocalcinosis is noted within the medial compartment. Soft tissues are unremarkable.  IMPRESSION: 1. Moderate tricompartment osteoarthritis and mild chondrocalcinosis.   Electronically Signed   By: Kerby Moors M.D.   On: 05/05/13 12:07    Scheduled Meds: . calcium-vitamin D  1 tablet Oral Daily  . docusate sodium  100 mg Oral BID  . erythromycin  250 mg Oral BID WC  . flecainide  50 mg Oral BID  . hydrochlorothiazide  25 mg Oral Daily  . milk and molasses  1 enema Rectal Once  . multivitamin  1 tablet Oral Daily  . pantoprazole  40 mg Oral BID AC  . polyethylene glycol  17 g Oral Daily  . vitamin B-12  1,000 mcg Oral Daily  . warfarin  6 mg Oral ONCE-1800  . Warfarin - Pharmacist Dosing Inpatient   Does not apply q1800   Continuous Infusions:    Principal Problem:   Closed left hip fracture Active Problems:   Hypertension   Hyperlipidemia   Atrial fibrillation   Warfarin anticoagulation   Hip fracture    Time spent: 30 minutes    [place]  [facility] Pager [phone]. If 7PM-7AM, please contact night-coverage at [email or web address], password [facility] 05-May-2013, 4:22 PM  LOS: 5 days

## 2013-05-03 NOTE — Progress Notes (Signed)
Physical Therapy Treatment Patient Details Name: Gloria [name] MRN: [id number] DOB: 1930-10-03 Today's Date: 10-May-2013    History of Present Illness Pt admit after fall with left proximal femur fracture with ORIF.  Pt with hx of lt and rt THA and rt TKA.    PT Comments    Pt making excellent progress. Plans [facility].  Follow Up Recommendations  SNF     Equipment Recommendations  None recommended by PT    Recommendations for Other Services       Precautions / Restrictions Precautions Precautions: Fall Restrictions Weight Bearing Restrictions: Yes LLE Weight Bearing: Partial weight bearing    Mobility  Bed Mobility Overal bed mobility: Needs Assistance Bed Mobility: Supine to Sit     Supine to sit: Min assist     General bed mobility comments: Needed assist for LES and elevation of trunk.  Transfers Overall transfer level: Needs assistance Equipment used: Rolling walker (2 wheeled) Transfers: Sit to/from Stand Sit to Stand: Mod assist         General transfer comment: Assist to bring hips up.  Ambulation/Gait Ambulation/Gait assistance: Min assist Ambulation Distance (Feet): 30 Feet Assistive device: Rolling walker (2 wheeled) Gait Pattern/deviations: Step-to pattern;Decreased stance time - left;Decreased step length - right Gait velocity: decr Gait velocity interpretation: Below normal speed for age/gender General Gait Details: Verbal cues to stand more erect.   Stairs            Wheelchair Mobility    Modified Rankin (Stroke Patients Only)       Balance   Sitting-balance support: No upper extremity supported;Feet supported Sitting balance-Leahy Scale: Fair     Standing balance support: Bilateral upper extremity supported Standing balance-Leahy Scale: Poor Standing balance comment: walker for support                    Cognition Arousal/Alertness: Awake/alert Behavior During Therapy: WFL for tasks  assessed/performed Overall Cognitive Status: Within Functional Limits for tasks assessed                      Exercises      General Comments        Pertinent Vitals/Pain Premedicated. Surgical pain in lt hip. Repositioned.    Home Living                      Prior Function            PT Goals (current goals can now be found in the care plan section) Progress towards PT goals: Progressing toward goals    Frequency  Min 5X/week    PT Plan Discharge plan needs to be updated    Co-evaluation             End of Session Equipment Utilized During Treatment: Gait belt Activity Tolerance: Patient tolerated treatment well Patient left: in chair;with call bell/phone within reach     Time: 1214-1227 PT Time Calculation (min): 13 min  Charges:  $Gait Training: 8-22 mins                    G Codes:      [place] May 10, 2013, 1:41 PM  [employer] PT [phone]

## 2013-05-03 NOTE — Discharge Instructions (Signed)
Partial weight bearing left LOWER extremity  Keep wound dry until staples out

## 2013-05-03 NOTE — Progress Notes (Signed)
Patient ID: Gloria [name], female   DOB: Jun 06, 1930, 80 y.o.   MRN: [id number] Subjective: 3 Days Post-Op Procedure(s) (LRB): OPEN REDUCTION INTERNAL FIXATION (ORIF) PERIPROSTHETIC FRACTURE (Left)    Patient reports pain as moderate per nursing some improvement in pain tolerance as compared to yesterday.  Still limited activity however. Objective:   VITALS:   Filed Vitals:   05/03/13 0506  BP: 120/63  Pulse: 66  Temp: 98.4 F (36.9 C)  Resp: 18    Incision: dressing C/D/I  LABS  Recent Labs  05/01/13 0543 05/02/13 0530 05/03/13 0350  HGB 11.9* 11.6* 11.7*  HCT 36.7 35.7* 35.5*  WBC 8.5 10.1 9.5  PLT 193 197 195     Recent Labs  05/01/13 0543 05/02/13 0530  NA 140 139  K 4.1 3.8  BUN 11 8  CREATININE 0.62 0.64  GLUCOSE 84 100*     Recent Labs  05/02/13 0530 05/03/13 0350  INR 2.81* 2.33*     Assessment/Plan: 3 Days Post-Op Procedure(s) (LRB): OPEN REDUCTION INTERNAL FIXATION (ORIF) PERIPROSTHETIC FRACTURE (Left)   Up with therapy Discharge to SNF when medically stable  PWB LLE for 6 weeks F/u in office in 2 weeks Call with questions, [phone]  On coumadin for a-fib/dvt prophylaxis per pharmacy Keep left thigh wound clean and dry until staples out in 2 weeks

## 2013-05-03 NOTE — Progress Notes (Signed)
Rehab Admissions Coordinator Note:  Patient was screened by Gloria [name] for appropriateness for an Inpatient Acute Rehab Consult.  At this time, we are recommending Inpatient Rehab consult.  Gloria [name] 05/03/2013, 8:21 AM  I can be reached at [phone].

## 2013-05-04 ENCOUNTER — Encounter (HOSPITAL_COMMUNITY): Payer: Self-pay | Admitting: Orthopedic Surgery

## 2013-05-04 DIAGNOSIS — J42 Unspecified chronic bronchitis: Secondary | ICD-10-CM | POA: Diagnosis not present

## 2013-05-04 DIAGNOSIS — Z8669 Personal history of other diseases of the nervous system and sense organs: Secondary | ICD-10-CM | POA: Diagnosis not present

## 2013-05-04 DIAGNOSIS — R6889 Other general symptoms and signs: Secondary | ICD-10-CM | POA: Diagnosis not present

## 2013-05-04 DIAGNOSIS — K5900 Constipation, unspecified: Secondary | ICD-10-CM | POA: Diagnosis not present

## 2013-05-04 DIAGNOSIS — R0902 Hypoxemia: Secondary | ICD-10-CM | POA: Diagnosis not present

## 2013-05-04 DIAGNOSIS — Z79899 Other long term (current) drug therapy: Secondary | ICD-10-CM | POA: Diagnosis not present

## 2013-05-04 DIAGNOSIS — Z8781 Personal history of (healed) traumatic fracture: Secondary | ICD-10-CM | POA: Diagnosis not present

## 2013-05-04 DIAGNOSIS — R112 Nausea with vomiting, unspecified: Secondary | ICD-10-CM | POA: Diagnosis not present

## 2013-05-04 DIAGNOSIS — K219 Gastro-esophageal reflux disease without esophagitis: Secondary | ICD-10-CM | POA: Diagnosis not present

## 2013-05-04 DIAGNOSIS — Z880 Allergy status to penicillin: Secondary | ICD-10-CM | POA: Diagnosis not present

## 2013-05-04 DIAGNOSIS — Z87891 Personal history of nicotine dependence: Secondary | ICD-10-CM | POA: Diagnosis not present

## 2013-05-04 DIAGNOSIS — S72009D Fracture of unspecified part of neck of unspecified femur, subsequent encounter for closed fracture with routine healing: Secondary | ICD-10-CM | POA: Diagnosis not present

## 2013-05-04 DIAGNOSIS — M79609 Pain in unspecified limb: Secondary | ICD-10-CM | POA: Diagnosis not present

## 2013-05-04 DIAGNOSIS — E782 Mixed hyperlipidemia: Secondary | ICD-10-CM | POA: Diagnosis not present

## 2013-05-04 DIAGNOSIS — I4891 Unspecified atrial fibrillation: Secondary | ICD-10-CM | POA: Diagnosis not present

## 2013-05-04 DIAGNOSIS — M129 Arthropathy, unspecified: Secondary | ICD-10-CM | POA: Diagnosis not present

## 2013-05-04 DIAGNOSIS — Z7901 Long term (current) use of anticoagulants: Secondary | ICD-10-CM | POA: Diagnosis not present

## 2013-05-04 DIAGNOSIS — I10 Essential (primary) hypertension: Secondary | ICD-10-CM | POA: Diagnosis not present

## 2013-05-04 DIAGNOSIS — J9819 Other pulmonary collapse: Secondary | ICD-10-CM | POA: Diagnosis not present

## 2013-05-04 DIAGNOSIS — T84049A Periprosthetic fracture around unspecified internal prosthetic joint, initial encounter: Secondary | ICD-10-CM | POA: Diagnosis not present

## 2013-05-04 DIAGNOSIS — S72009A Fracture of unspecified part of neck of unspecified femur, initial encounter for closed fracture: Secondary | ICD-10-CM | POA: Diagnosis not present

## 2013-05-04 DIAGNOSIS — Z87440 Personal history of urinary (tract) infections: Secondary | ICD-10-CM | POA: Diagnosis not present

## 2013-05-04 DIAGNOSIS — J3489 Other specified disorders of nose and nasal sinuses: Secondary | ICD-10-CM | POA: Diagnosis not present

## 2013-05-04 DIAGNOSIS — R509 Fever, unspecified: Secondary | ICD-10-CM | POA: Diagnosis not present

## 2013-05-04 DIAGNOSIS — Z792 Long term (current) use of antibiotics: Secondary | ICD-10-CM | POA: Diagnosis not present

## 2013-05-04 DIAGNOSIS — E785 Hyperlipidemia, unspecified: Secondary | ICD-10-CM | POA: Diagnosis not present

## 2013-05-04 DIAGNOSIS — Z8619 Personal history of other infectious and parasitic diseases: Secondary | ICD-10-CM | POA: Diagnosis not present

## 2013-05-04 DIAGNOSIS — Z5189 Encounter for other specified aftercare: Secondary | ICD-10-CM | POA: Diagnosis not present

## 2013-05-04 DIAGNOSIS — M25559 Pain in unspecified hip: Secondary | ICD-10-CM | POA: Diagnosis not present

## 2013-05-04 LAB — URINALYSIS, ROUTINE W REFLEX MICROSCOPIC
Bilirubin Urine: NEGATIVE
Glucose, UA: NEGATIVE mg/dL
Hgb urine dipstick: NEGATIVE
Ketones, ur: NEGATIVE mg/dL
Nitrite: POSITIVE — AB
Protein, ur: NEGATIVE mg/dL
Specific Gravity, Urine: 1.007 (ref 1.005–1.030)
Urobilinogen, UA: 1 mg/dL (ref 0.0–1.0)
pH: 7 (ref 5.0–8.0)

## 2013-05-04 LAB — URINE MICROSCOPIC-ADD ON

## 2013-05-04 LAB — CBC
HCT: 35.8 % — ABNORMAL LOW (ref 36.0–46.0)
Hemoglobin: 11.7 g/dL — ABNORMAL LOW (ref 12.0–15.0)
MCH: 29.5 pg (ref 26.0–34.0)
MCHC: 32.7 g/dL (ref 30.0–36.0)
MCV: 90.4 fL (ref 78.0–100.0)
Platelets: 221 10*3/uL (ref 150–400)
RBC: 3.96 MIL/uL (ref 3.87–5.11)
RDW: 14.3 % (ref 11.5–15.5)
WBC: 8.2 10*3/uL (ref 4.0–10.5)

## 2013-05-04 LAB — PROTIME-INR
INR: 2.02 — ABNORMAL HIGH (ref 0.00–1.49)
Prothrombin Time: 22.2 seconds — ABNORMAL HIGH (ref 11.6–15.2)

## 2013-05-04 MED ORDER — CIPROFLOXACIN HCL 500 MG PO TABS
500.0000 mg | ORAL_TABLET | Freq: Two times a day (BID) | ORAL | Status: DC
  Administered 2013-05-04: 500 mg via ORAL
  Filled 2013-05-04 (×3): qty 1

## 2013-05-04 MED ORDER — DSS 100 MG PO CAPS: 100.0000 mg | ORAL_CAPSULE | Freq: Two times a day (BID) | ORAL | Status: DC

## 2013-05-04 MED ORDER — ALUM & MAG HYDROXIDE-SIMETH 200-200-20 MG/5ML PO SUSP: 30.0000 mL | ORAL | Status: DC | PRN

## 2013-05-04 MED ORDER — CIPROFLOXACIN HCL 500 MG PO TABS
500.0000 mg | ORAL_TABLET | Freq: Two times a day (BID) | ORAL | Status: AC
Start: 2013-05-04 — End: 2013-05-09

## 2013-05-04 MED ORDER — POLYETHYLENE GLYCOL 3350 17 G PO PACK: 17.0000 g | PACK | Freq: Every day | ORAL | Status: DC | PRN

## 2013-05-04 NOTE — Progress Notes (Signed)
Clinical Social Work Department CLINICAL SOCIAL WORK PLACEMENT NOTE 05/04/2013  Patient:  Gloria [name], Gloria [name]  Account Number:  [id number] Admit date:  04/28/2013  Clinical Social Worker:  Megan Salon  Date/time:  05/03/2013 01:10 PM  Clinical Social Work is seeking post-discharge placement for this patient at the following level of care:   SKILLED NURSING   (*CSW will update this form in Epic as items are completed)   05/03/2013  Patient/family provided with [employer] Department of Clinical Social Work's list of facilities offering this level of care within the geographic area requested by the patient (or if unable, by the patient's family).  05/03/2013  Patient/family informed of their freedom to choose among providers that offer the needed level of care, that participate in Medicare, Medicaid or managed care program needed by the patient, have an available bed and are willing to accept the patient.  05/03/2013  Patient/family informed of [facility]' ownership interest in [facility], as well as of the fact that they are under no obligation to receive care at this facility.  PASARR submitted to EDS on 05/03/2013 PASARR number received from EDS on 05/03/2013  FL2 transmitted to all facilities in geographic area requested by pt/family on  05/03/2013 FL2 transmitted to all facilities within larger geographic area on   Patient informed that his/her managed care company has contracts with or will negotiate with  certain facilities, including the following:     Patient/family informed of bed offers received:  05/04/2013 Patient chooses bed at [facility] [place] Physician recommends and patient chooses bed at    Patient to be transferred to [place] on  05/04/2013 Patient to be transferred to facility by EMS  The following physician request were entered in Epic:   Additional Comments:  Jeanette Caprice, MSW,  [place]

## 2013-05-04 NOTE — Progress Notes (Signed)
Clinical Social Worker facilitated patient discharge by contacting the patient and facility, [facility]. Patient agreeable to this plan and arranging transport via EMS . CSW will sign off, as social work intervention is no longer needed.  Jeanette Caprice, MSW, [place]

## 2013-05-04 NOTE — Discharge Summary (Signed)
Physician Discharge Summary  Gloria [name] RSW:[id number] DOB: 07-Jan-1931 DOA: 04/28/2013  PCP: [name], MD  Admit date: 04/28/2013 Discharge date: 05/04/2013  Time spent: 35 minutes  Recommendations for Outpatient Follow-up:  1. Please obtain twice weekly PT/INR checks on Tues and Thurs,  She had an INR on 2.02 on 05/04/13. She is being discharged on Cipro for UTI and therefore PT/INR will need to be monitored.  2. Would recommend checking a CBC and BMP in 4-5 days  Discharge Diagnoses:  Principal Problem:   Closed left hip fracture Active Problems:   Hypertension   Hyperlipidemia   Atrial fibrillation   Warfarin anticoagulation   Hip fracture   Discharge Condition: Stable  Diet recommendation: Heart Healthy  Filed Weights   04/28/13 2024  Weight: 69.1 kg (152 lb 5.4 oz)    History of present illness:  Patient is 80 year old female with history of hypertension, hyperlipidemia, GERD, atrial fibrillation on Coumadin and flecainide presented to the ER with mechanical fall. History was obtained from the patient who reported that she was working in her yard and then turned around to go into the house when she had a mechanical fall on her driveway. Patient states that she felt slightly dizzy on turning suddenly but did not have any syncopal episode, chest pain, palpitations or shortness of breath.  CT of the left hip showed undisplaced fracture or surrounding the femoral prosthesis on the left just below the lesser trochanter and extending and somewhat spiral fracture of the posterior aspect of the proximal femur  Hospital Course:  Patient is a pleasant 80 year old female with a past medical is in atrial fibrillation, on anticoagulation, hypertension, dyslipidemia, was admitted to the medicine service on 04/28/2013. Patient had reported working in her yard, having a fall on her driveway. This occurred as she turned around. She denies symptoms prior to fall. She was found to  have nondisplaced fractures surrounding femoral prosthesis on the left just below the lesser trochanter. Her anticoagulation has been reversed and she will covered with IV heparin. Patient was taken to the OR on 04/30/2013. She underwent open reduction and internal fixation of the left periprosthetic proximal femoral fracture utilizing Synthes cables. Patient tolerated the procedure well there were no immediate complications. Patient was restarted on Coumadin. Postoperative course was notable for constipation as she required an enema on 05/03/2013. This resulted in several bowel movements. Patient otherwise recovered well, with physical therapy. She does not have urinary tract infection, to be discharged on ciprofloxacin 500 mg by mouth twice a day. Please followup on biweekly PT/INR checks. She was discharged in stable condition to her skilled nursing facility on 05/04/2013  Procedures:  Open reduction internal fixation of left periprosthetic proximal femur fracture  Consultations:  Orthopedic surgery  Social work  Physical therapy   Discharge Exam: Filed Vitals:   05/04/13 0442  BP: 113/54  Pulse: 53  Temp: 97.6 F (36.4 C)  Resp: 17   General: Patient is in no acute distress awake alert  Cardiovascular: Regular rate and rhythm normal S1-S2  Respiratory: Clear to auscultation bilaterally no wheezing rhonchi or rales  Abdomen: Soft nontender nondistended  Musculoskeletal: Left lower extremity mildly swollen, surgical incision site appears clean, no bleed.    Discharge Instructions You were cared for by a hospitalist during your hospital stay. If you have any questions about your discharge medications or the care you received while you were in the hospital after you are discharged, you can call the unit and asked to  speak with the hospitalist on call if the hospitalist that took care of you is not available. Once you are discharged, your primary care physician will handle any further  medical issues. Please note that NO REFILLS for any discharge medications will be authorized once you are discharged, as it is imperative that you return to your primary care physician (or establish a relationship with a primary care physician if you do not have one) for your aftercare needs so that they can reassess your need for medications and monitor your lab values.      Discharge Orders   Future Appointments Provider Department Dept Phone   06/23/2013 9:00 AM Peter M Martinique, MD [place] Office [phone]   Future Orders Complete By Expires   Call MD for:  difficulty breathing, headache or visual disturbances  As directed    Call MD for:  extreme fatigue  As directed    Call MD for:  persistant dizziness or light-headedness  As directed    Call MD for:  persistant nausea and vomiting  As directed    Call MD for:  temperature >100.4  As directed    Diet - low sodium heart healthy  As directed    Increase activity slowly  As directed    Partial weight bearing  As directed    Scheduling Instructions:   50%   Questions:     % Body Weight:     Laterality:     Extremity:         Medication List    STOP taking these medications       hydrochlorothiazide 25 MG tablet  Commonly known as:  HYDRODIURIL      TAKE these medications       alum & mag hydroxide-simeth 200-200-20 MG/5ML suspension  Commonly known as:  MAALOX/MYLANTA  Take 30 mLs by mouth every 4 (four) hours as needed for indigestion.     CALCIUM 600 + D PO  Take 1 tablet by mouth 2 (two) times daily.     ciprofloxacin 500 MG tablet  Commonly known as:  CIPRO  Take 1 tablet (500 mg total) by mouth 2 (two) times daily.     DSS 100 MG Caps  Take 100 mg by mouth 2 (two) times daily.     erythromycin 250 MG tablet  Commonly known as:  E-MYCIN  Take 1 tablet by mouth Twice daily.     flecainide 50 MG tablet  Commonly known as:  TAMBOCOR  Take 1 tablet (50 mg total) by mouth 2 (two) times daily.      HYDROcodone-acetaminophen 5-325 MG per tablet  Commonly known as:  NORCO  Take 1-2 tablets by mouth every 6 (six) hours as needed.     ICAPS MV PO  Take 2 capsules by mouth daily.     polyethylene glycol packet  Commonly known as:  MIRALAX / GLYCOLAX  Take 17 g by mouth daily as needed for mild constipation.     PRILOSEC OTC 20 MG tablet  Generic drug:  omeprazole  Take 20 mg by mouth 2 (two) times daily.     vitamin B-12 1000 MCG tablet  Commonly known as:  CYANOCOBALAMIN  Take 1,000 mcg by mouth daily.     warfarin 5 MG tablet  Commonly known as:  COUMADIN  Take 1 tablet (5 mg total) by mouth daily.       Allergies  Allergen Reactions  . Amoxicillin Other (See Comments)  unknown  . Cephalexin Other (See Comments)    unknown  . Codeine Other (See Comments)    unknown  . Metoclopramide Hcl Other (See Comments)    unknown   Follow-up Information   Follow up with [name], MD. Schedule an appointment as soon as possible for a visit in 2 weeks.   Specialty:  Orthopedic Surgery   Contact information:   [address] [place] [address] [phone]        The results of significant diagnostics from this hospitalization (including imaging, microbiology, ancillary and laboratory) are listed below for reference.    Significant Diagnostic Studies: Dg Chest 1 View  04/28/2013   CLINICAL DATA:  Recent traumatic injury with pain  EXAM: CHEST - 1 VIEW  COMPARISON:  03/06/2010  FINDINGS: The cardiac shadow is within normal limits. The lungs are well aerated bilaterally. Mild interstitial changes are identified. No acute bony abnormality is seen.  IMPRESSION: No acute abnormality noted.   Electronically Signed   By: [name] M.D.   On: 04/28/2013 16:47   Dg Elbow Complete Left  04/28/2013   CLINICAL DATA:  Traumatic injury with pain  EXAM: LEFT ELBOW - COMPLETE 3+ VIEW  COMPARISON:  None.  FINDINGS: There is no evidence of fracture,  dislocation, or joint effusion. There is no evidence of arthropathy or other focal bone abnormality. Soft tissues are unremarkable.  IMPRESSION: No acute abnormality noted.   Electronically Signed   By: [name] M.D.   On: 04/28/2013 16:45   Dg Hip Complete Left  04/28/2013   CLINICAL DATA:  Traumatic injury with pain  EXAM: LEFT HIP - COMPLETE 2+ VIEW  COMPARISON:  None.  FINDINGS: Bilateral hip prostheses are seen. Degenerative changes of lumbar spine are noted. The pelvic ring is intact. There is irregularity in the medial aspect of the proximal femur suspicious for a intertrochanteric fracture. CT may be helpful for further evaluation.  IMPRESSION: Possible left proximal femoral fracture. A CT of the hip is recommended for further evaluation.   Electronically Signed   By: [name] M.D.   On: 04/28/2013 16:47   Dg Knee 1-2 Views Left  05/03/2013   CLINICAL DATA:  Recent surgery for periprosthetic fracture.  EXAM: LEFT KNEE - 1-2 VIEW  COMPARISON:  None.  FINDINGS: There is no evidence of fracture, dislocation, or joint effusion. There is tricompartment joint space narrowing, marginal spur formation and subchondral sclerosis. Mild chondrocalcinosis is noted within the medial compartment. Soft tissues are unremarkable.  IMPRESSION: 1. Moderate tricompartment osteoarthritis and mild chondrocalcinosis.   Electronically Signed   By: Kerby Moors M.D.   On: 05/03/2013 12:07   Ct Head Wo Contrast  04/28/2013   CLINICAL DATA:  Recent traumatic injury and pain  EXAM: CT HEAD WITHOUT CONTRAST  TECHNIQUE: Contiguous axial images were obtained from the base of the skull through the vertex without intravenous contrast.  COMPARISON:  11/21/2009  FINDINGS: The bony calvarium is intact. No gross soft tissue abnormality is noted. There is a calcified meningioma noted in the right occipital region stable from the prior exam. Mild atrophic changes are seen. No findings to suggest acute hemorrhage, acute  infarction or space-occupying mass lesion are noted.  IMPRESSION: No acute abnormality seen.   Electronically Signed   By: [name] M.D.   On: 04/28/2013 17:36   Dg Pelvis Portable  04/30/2013   CLINICAL DATA:  Fixation of periprosthetic fracture.  EXAM: PORTABLE PELVIS 1-2 VIEWS  COMPARISON:  CT scan left hip 04/28/2013.  FINDINGS: Bipolar left hip hemiarthroplasty is in place. Four new cerclage wires are seen about the femur for fixation of a periprosthetic fracture. Hardware is intact. No new abnormality. Position and alignment are anatomic.  IMPRESSION: Fixation of periprosthetic left femur fracture. No acute abnormality.   Electronically Signed   By: Inge Rise M.D.   On: 04/30/2013 20:13   Ct Hip Left Wo Contrast  04/28/2013   CLINICAL DATA:  Possible undisplaced fracture on recent plain film examination, recent trauma  EXAM: CT OF THE LEFT HIP WITHOUT CONTRAST  TECHNIQUE: Multidetector CT imaging was performed according to the standard protocol. Multiplanar CT image reconstructions were also generated.  COMPARISON:  Plain film from earlier in the same day.  FINDINGS: The left hip prosthesis is again identified and causes significant scatter artifact and some degradation of the images. There is however nondisplaced fracture surrounding the prosthesis just below the lesser trochanter moving in a spiral formation around the posterior aspect of the femur. This extends towards the midshaft of the femur but is again only mildly displaced. No other fractures are seen. Some changes are noted in the surrounding muscle bellies likely related to localized edema from the fracture. The prosthesis is well seated within the acetabulum. No other focal abnormality is noted.  IMPRESSION: Undisplaced fracture or surrounding the femoral prosthesis on the left just below the lesser trochanter and extending in is somewhat spiral fracture on the posterior aspect of the proximal femur.   Electronically Signed   By:  [name] M.D.   On: 04/28/2013 17:40    Microbiology: Recent Results (from the past 240 hour(s))  URINE CULTURE     Status: None   Collection Time    04/28/13 10:23 PM      Result Value Ref Range Status   Specimen Description URINE, CATHETERIZED   Final   Special Requests NONE   Final   Culture  Setup Time     Final   Value: 04/28/2013 23:53     Performed at [facility]     Final   Value: NO GROWTH     Performed at [facility]   Culture     Final   Value: NO GROWTH     Performed at [facility]   Report Status 04/30/2013 FINAL   Final  MRSA PCR SCREENING     Status: None   Collection Time    04/29/13  5:38 AM      Result Value Ref Range Status   MRSA by PCR NEGATIVE  NEGATIVE Final   Comment:            The GeneXpert MRSA Assay (FDA     approved for NASAL specimens     only), is one component of a     comprehensive MRSA colonization     surveillance program. It is not     intended to diagnose MRSA     infection nor to guide or     monitor treatment for     MRSA infections.     Labs: Basic Metabolic Panel:  Recent Labs Lab 04/28/13 1529 04/29/13 0420 05/01/13 0543 05/02/13 0530  NA 143 141 140 139  K 4.0 3.9 4.1 3.8  CL 104 106 102 99  CO2 23 22 24 26   GLUCOSE 129* 89 84 100*  BUN 22 16 11 8   CREATININE 0.73 0.70 0.62 0.64  CALCIUM 9.5  8.5 8.9 9.0   Liver Function Tests:  Recent Labs Lab 04/28/13 1529  AST 23  ALT 16  ALKPHOS 120*  BILITOT 0.6  PROT 6.7  ALBUMIN 3.8   No results found for this basename: LIPASE, AMYLASE,  in the last 168 hours No results found for this basename: AMMONIA,  in the last 168 hours CBC:  Recent Labs Lab 04/28/13 1529  04/30/13 0446 05/01/13 0543 05/02/13 0530 05/03/13 0350 05/04/13 0436  WBC 6.0  < > 5.7 8.5 10.1 9.5 8.2  NEUTROABS 4.3  --   --   --   --   --   --   HGB 13.8  < > 12.2 11.9* 11.6* 11.7* 11.7*  HCT 42.2  < > 37.5 36.7 35.7* 35.5* 35.8*  MCV 90.0  <  > 90.1 90.0 89.9 89.9 90.4  PLT 234  < > 190 193 197 195 221  < > = values in this interval not displayed. Cardiac Enzymes:  Recent Labs Lab 04/28/13 1904 04/29/13 0038 04/29/13 0645  TROPONINI <0.30 <0.30 <0.30   BNP: BNP (last 3 results) No results found for this basename: PROBNP,  in the last 8760 hours CBG: No results found for this basename: GLUCAP,  in the last 168 hours     Signed:  [facility] 05/04/2013, 12:05 PM

## 2013-05-06 DIAGNOSIS — I10 Essential (primary) hypertension: Secondary | ICD-10-CM | POA: Diagnosis not present

## 2013-05-06 DIAGNOSIS — S72009A Fracture of unspecified part of neck of unspecified femur, initial encounter for closed fracture: Secondary | ICD-10-CM | POA: Diagnosis not present

## 2013-05-06 DIAGNOSIS — I4891 Unspecified atrial fibrillation: Secondary | ICD-10-CM | POA: Diagnosis not present

## 2013-05-06 DIAGNOSIS — E782 Mixed hyperlipidemia: Secondary | ICD-10-CM | POA: Diagnosis not present

## 2013-05-07 DIAGNOSIS — E785 Hyperlipidemia, unspecified: Secondary | ICD-10-CM | POA: Diagnosis not present

## 2013-05-07 DIAGNOSIS — T84049A Periprosthetic fracture around unspecified internal prosthetic joint, initial encounter: Secondary | ICD-10-CM | POA: Diagnosis not present

## 2013-05-07 DIAGNOSIS — I4891 Unspecified atrial fibrillation: Secondary | ICD-10-CM | POA: Diagnosis not present

## 2013-05-07 DIAGNOSIS — R112 Nausea with vomiting, unspecified: Secondary | ICD-10-CM | POA: Diagnosis not present

## 2013-05-07 DIAGNOSIS — J9819 Other pulmonary collapse: Secondary | ICD-10-CM | POA: Diagnosis not present

## 2013-05-07 DIAGNOSIS — I10 Essential (primary) hypertension: Secondary | ICD-10-CM | POA: Diagnosis not present

## 2013-05-07 DIAGNOSIS — M79609 Pain in unspecified limb: Secondary | ICD-10-CM | POA: Diagnosis not present

## 2013-05-07 DIAGNOSIS — Z7901 Long term (current) use of anticoagulants: Secondary | ICD-10-CM | POA: Diagnosis not present

## 2013-05-07 DIAGNOSIS — R6889 Other general symptoms and signs: Secondary | ICD-10-CM | POA: Diagnosis not present

## 2013-05-07 DIAGNOSIS — K219 Gastro-esophageal reflux disease without esophagitis: Secondary | ICD-10-CM | POA: Diagnosis not present

## 2013-05-07 DIAGNOSIS — Z5189 Encounter for other specified aftercare: Secondary | ICD-10-CM | POA: Diagnosis not present

## 2013-05-07 DIAGNOSIS — R509 Fever, unspecified: Secondary | ICD-10-CM | POA: Diagnosis not present

## 2013-05-07 DIAGNOSIS — S72009A Fracture of unspecified part of neck of unspecified femur, initial encounter for closed fracture: Secondary | ICD-10-CM | POA: Diagnosis not present

## 2013-05-07 DIAGNOSIS — R0902 Hypoxemia: Secondary | ICD-10-CM | POA: Diagnosis not present

## 2013-05-07 DIAGNOSIS — S72009D Fracture of unspecified part of neck of unspecified femur, subsequent encounter for closed fracture with routine healing: Secondary | ICD-10-CM | POA: Diagnosis not present

## 2013-05-07 DIAGNOSIS — J42 Unspecified chronic bronchitis: Secondary | ICD-10-CM | POA: Diagnosis not present

## 2013-05-11 DIAGNOSIS — I10 Essential (primary) hypertension: Secondary | ICD-10-CM | POA: Diagnosis not present

## 2013-05-11 DIAGNOSIS — I4891 Unspecified atrial fibrillation: Secondary | ICD-10-CM | POA: Diagnosis not present

## 2013-05-11 DIAGNOSIS — K219 Gastro-esophageal reflux disease without esophagitis: Secondary | ICD-10-CM | POA: Diagnosis not present

## 2013-05-11 DIAGNOSIS — Z7901 Long term (current) use of anticoagulants: Secondary | ICD-10-CM | POA: Diagnosis not present

## 2013-05-11 DIAGNOSIS — S72009A Fracture of unspecified part of neck of unspecified femur, initial encounter for closed fracture: Secondary | ICD-10-CM | POA: Diagnosis not present

## 2013-05-14 DIAGNOSIS — T84049A Periprosthetic fracture around unspecified internal prosthetic joint, initial encounter: Secondary | ICD-10-CM | POA: Diagnosis not present

## 2013-05-16 DIAGNOSIS — I4891 Unspecified atrial fibrillation: Secondary | ICD-10-CM | POA: Diagnosis not present

## 2013-05-16 DIAGNOSIS — S72009A Fracture of unspecified part of neck of unspecified femur, initial encounter for closed fracture: Secondary | ICD-10-CM | POA: Diagnosis not present

## 2013-05-16 DIAGNOSIS — Z7901 Long term (current) use of anticoagulants: Secondary | ICD-10-CM | POA: Diagnosis not present

## 2013-05-19 ENCOUNTER — Encounter (HOSPITAL_COMMUNITY): Payer: Self-pay | Admitting: Emergency Medicine

## 2013-05-19 ENCOUNTER — Emergency Department (HOSPITAL_COMMUNITY): Payer: Medicare Other

## 2013-05-19 ENCOUNTER — Emergency Department (HOSPITAL_COMMUNITY)
Admission: EM | Admit: 2013-05-19 | Discharge: 2013-05-19 | Disposition: A | Discharge status: Home / Self Care | Payer: Medicare Other | Attending: Emergency Medicine | Admitting: Emergency Medicine

## 2013-05-19 DIAGNOSIS — I10 Essential (primary) hypertension: Secondary | ICD-10-CM | POA: Insufficient documentation

## 2013-05-19 DIAGNOSIS — E785 Hyperlipidemia, unspecified: Secondary | ICD-10-CM | POA: Insufficient documentation

## 2013-05-19 DIAGNOSIS — Z792 Long term (current) use of antibiotics: Secondary | ICD-10-CM | POA: Insufficient documentation

## 2013-05-19 DIAGNOSIS — J3489 Other specified disorders of nose and nasal sinuses: Secondary | ICD-10-CM | POA: Insufficient documentation

## 2013-05-19 DIAGNOSIS — R509 Fever, unspecified: Secondary | ICD-10-CM

## 2013-05-19 DIAGNOSIS — Z8781 Personal history of (healed) traumatic fracture: Secondary | ICD-10-CM | POA: Insufficient documentation

## 2013-05-19 DIAGNOSIS — Z880 Allergy status to penicillin: Secondary | ICD-10-CM | POA: Insufficient documentation

## 2013-05-19 DIAGNOSIS — Z8619 Personal history of other infectious and parasitic diseases: Secondary | ICD-10-CM | POA: Insufficient documentation

## 2013-05-19 DIAGNOSIS — R0902 Hypoxemia: Secondary | ICD-10-CM | POA: Diagnosis not present

## 2013-05-19 DIAGNOSIS — Z7901 Long term (current) use of anticoagulants: Secondary | ICD-10-CM | POA: Insufficient documentation

## 2013-05-19 DIAGNOSIS — K219 Gastro-esophageal reflux disease without esophagitis: Secondary | ICD-10-CM | POA: Insufficient documentation

## 2013-05-19 DIAGNOSIS — Z87440 Personal history of urinary (tract) infections: Secondary | ICD-10-CM | POA: Insufficient documentation

## 2013-05-19 DIAGNOSIS — Z8669 Personal history of other diseases of the nervous system and sense organs: Secondary | ICD-10-CM | POA: Insufficient documentation

## 2013-05-19 DIAGNOSIS — J9819 Other pulmonary collapse: Secondary | ICD-10-CM | POA: Diagnosis not present

## 2013-05-19 DIAGNOSIS — J42 Unspecified chronic bronchitis: Secondary | ICD-10-CM | POA: Diagnosis not present

## 2013-05-19 DIAGNOSIS — Z79899 Other long term (current) drug therapy: Secondary | ICD-10-CM | POA: Insufficient documentation

## 2013-05-19 DIAGNOSIS — M129 Arthropathy, unspecified: Secondary | ICD-10-CM | POA: Insufficient documentation

## 2013-05-19 DIAGNOSIS — Z87891 Personal history of nicotine dependence: Secondary | ICD-10-CM | POA: Insufficient documentation

## 2013-05-19 LAB — CBC WITH DIFFERENTIAL/PLATELET
Basophils Absolute: 0 10*3/uL (ref 0.0–0.1)
Basophils Relative: 0 % (ref 0–1)
Eosinophils Absolute: 0.5 10*3/uL (ref 0.0–0.7)
Eosinophils Relative: 6 % — ABNORMAL HIGH (ref 0–5)
HCT: 39 % (ref 36.0–46.0)
Hemoglobin: 12.9 g/dL (ref 12.0–15.0)
Lymphocytes Relative: 4 % — ABNORMAL LOW (ref 12–46)
Lymphs Abs: 0.3 10*3/uL — ABNORMAL LOW (ref 0.7–4.0)
MCH: 29.5 pg (ref 26.0–34.0)
MCHC: 33.1 g/dL (ref 30.0–36.0)
MCV: 89 fL (ref 78.0–100.0)
Monocytes Absolute: 0.4 10*3/uL (ref 0.1–1.0)
Monocytes Relative: 5 % (ref 3–12)
Neutro Abs: 6.7 10*3/uL (ref 1.7–7.7)
Neutrophils Relative %: 86 % — ABNORMAL HIGH (ref 43–77)
Platelets: 292 10*3/uL (ref 150–400)
RBC: 4.38 MIL/uL (ref 3.87–5.11)
RDW: 15.2 % (ref 11.5–15.5)
WBC: 7.9 10*3/uL (ref 4.0–10.5)

## 2013-05-19 LAB — URINALYSIS, ROUTINE W REFLEX MICROSCOPIC
Bilirubin Urine: NEGATIVE
Glucose, UA: NEGATIVE mg/dL
Hgb urine dipstick: NEGATIVE
Ketones, ur: NEGATIVE mg/dL
Leukocytes, UA: NEGATIVE
Nitrite: NEGATIVE
Protein, ur: NEGATIVE mg/dL
Specific Gravity, Urine: 1.012 (ref 1.005–1.030)
Urobilinogen, UA: 0.2 mg/dL (ref 0.0–1.0)
pH: 7.5 (ref 5.0–8.0)

## 2013-05-19 LAB — BASIC METABOLIC PANEL
BUN: 14 mg/dL (ref 6–23)
CO2: 27 mEq/L (ref 19–32)
Calcium: 9.7 mg/dL (ref 8.4–10.5)
Chloride: 98 mEq/L (ref 96–112)
Creatinine, Ser: 0.67 mg/dL (ref 0.50–1.10)
GFR calc Af Amer: >90 mL/min (ref >90)
GFR calc non Af Amer: 81 mL/min — ABNORMAL LOW (ref >90)
Glucose, Bld: 100 mg/dL — ABNORMAL HIGH (ref 70–99)
Potassium: 4.6 mEq/L (ref 3.7–5.3)
Sodium: 138 mEq/L (ref 137–147)

## 2013-05-19 LAB — PROTIME-INR
INR: 2.05 — ABNORMAL HIGH (ref 0.00–1.49)
Prothrombin Time: 22.5 seconds — ABNORMAL HIGH (ref 11.6–15.2)

## 2013-05-19 MED ORDER — MORPHINE SULFATE 4 MG/ML IJ SOLN
4.0000 mg | Freq: Once | INTRAMUSCULAR | Status: AC
  Administered 2013-05-19: 4 mg via INTRAVENOUS
  Filled 2013-05-19: qty 1

## 2013-05-19 MED ORDER — ALBUTEROL SULFATE (2.5 MG/3ML) 0.083% IN NEBU
2.5000 mg | INHALATION_SOLUTION | Freq: Once | RESPIRATORY_TRACT | Status: AC
  Administered 2013-05-19: 2.5 mg via RESPIRATORY_TRACT
  Filled 2013-05-19: qty 3

## 2013-05-19 MED ORDER — ONDANSETRON HCL 4 MG/2ML IJ SOLN
4.0000 mg | Freq: Once | INTRAMUSCULAR | Status: AC
  Administered 2013-05-19: 4 mg via INTRAVENOUS
  Filled 2013-05-19: qty 2

## 2013-05-19 MED ORDER — DIAZEPAM 2 MG PO TABS
2.0000 mg | ORAL_TABLET | Freq: Once | ORAL | Status: AC
  Administered 2013-05-19: 2 mg via ORAL
  Filled 2013-05-19: qty 1

## 2013-05-19 MED ORDER — WHITE PETROLATUM GEL: Status: DC | PRN

## 2013-05-19 MED ORDER — LEVOFLOXACIN 500 MG PO TABS
750.0000 mg | ORAL_TABLET | Freq: Once | ORAL | Status: AC
  Administered 2013-05-19: 750 mg via ORAL
  Filled 2013-05-19: qty 2

## 2013-05-19 MED ORDER — SODIUM CHLORIDE 0.9 % IV BOLUS (SEPSIS)
500.0000 mL | Freq: Once | INTRAVENOUS | Status: AC
  Administered 2013-05-19: 500 mL via INTRAVENOUS

## 2013-05-19 MED ORDER — DOXYCYCLINE HYCLATE 100 MG PO CAPS: 100.0000 mg | ORAL_CAPSULE | Freq: Two times a day (BID) | ORAL | Status: DC

## 2013-05-19 MED ORDER — IOHEXOL 350 MG/ML SOLN
100.0000 mL | Freq: Once | INTRAVENOUS | Status: AC | PRN
  Administered 2013-05-19: 100 mL via INTRAVENOUS

## 2013-05-19 NOTE — ED Notes (Signed)
Pt/family aware awaiting admission at this time.  Pt med per [facility].  Denies further needs/complaints.  Denies pain.  NAD.

## 2013-05-19 NOTE — ED Notes (Signed)
Initial Contact - pt resting on stretcher with eyes closed, family at bedside.  Pt reports pain improved at this time, 4/10 currently, after previous medication.  Pt denies needs/complaints at this time.  Skin PWD.  Awake, alert.  Speaking full/clear sentences.  RR even/un-lab.  Pt denies SOB.  Maintained on 2L [place] at this time with sats in the upper 90s.  NAD.

## 2013-05-19 NOTE — Discharge Instructions (Signed)
You need to have your INR (coumadin levels) followed while on antibiotics.  You need to use the incentive spirometer several times per day.   Hypoxemia Hypoxemia occurs when your blood does not contain enough oxygen. The body cannot work well when it does not have enough oxygen, because every part of your body needs oxygen. Oxygen travels to all parts of the body through your blood. Hypoxemia can develop suddenly or can come on slowly. CAUSES Some common causes of hypoxemia include:  Long-term (chronic) lung diseases, such as chronic obstructive pulmonary disease (COPD) or interstitial lung disease.  Disorders that affect breathing at night, such as sleep apnea.  Fluid buildup in your lungs (pulmonary edema).  Lung infection (pneumonia).  Lung or throat cancer.  Abnormal blood flow that bypasses the lungs (shunt).  Certain diseasesthat affect nerves or muscles.  A collapsed lung (pneumothorax).  A blood clot in the lungs (pulmonary embolus).  Certain types of heart disease.  Slow or shallow breathing (hypoventilation).  Certain medicines.  High altitudes.  Toxic chemicals and gases. SIGNS AND SYMPTOMS Not everyone who has hypoxemia will develop symptoms. If the hypoxemia developed quickly, you will likely have symptoms such as shortness of breath. If the hypoxemia came on slowly over months or years, you may not notice any symptoms. Symptoms can include:  Shortness of breath (dyspnea).  Bluish color of the skin, lips, or nail beds.  Breathing that is fast, noisy, or shallow.  A fast heartbeat.  Feeling tired or sleepy.  Being confused or feeling anxious. DIAGNOSIS To determine if you have hypoxemia, your health care provider may perform:  A physical exam.  Blood tests.  A pulse oximetry. A sensor will be put on your finger, toe, or earlobe to measure the percent of oxygen in your blood. TREATMENT You will likely be treated with oxygen therapy. Depending  on the cause of your hypoxemia, you may need oxygen for a short time (weeks or months), or you may need it indefinitely. Your health care provider may also recommend other therapies to treat the underlying cause of your hypoxemia. HOME CARE INSTRUCTIONS  Only take over-the-counter or prescription medicines as directed by your health care provider.  Follow oxygen safety measures if you are on oxygen therapy. These may include:  Always having a backup supply of oxygen.  Not allowing anyone to smoke around oxygen.  Handling the oxygen tanks carefully and as instructed.  If you smoke, quit. Stay away from people who smoke.  Follow up with your health care provider as directed. SEEK MEDICAL CARE IF:  You have any concerns about your oxygen therapy.  You still have trouble breathing.  You become short of breath when you exercise.  You are tired when you wake up.  You have a headache when you wake up. SEEK IMMEDIATE MEDICAL CARE IF:   Your breathing gets worse.  You have new shortness of breath with normal activity.  You have a bluish color of the skin, lips, or nail beds.  You have confusion or cloudy thinking.  You cough up dark mucus.  You have chest pain.  You have a fever. MAKE SURE YOU:  Understand these instructions.  Will watch your condition.  Will get help right away if you are not doing well or get worse. Document Released: 07/09/2010 Document Revised: 08/26/2012 Document Reviewed: 07/23/2012 [facility] Patient Information 2014 [place], [place].  Fever, Adult A fever is a higher than normal body temperature. In an adult, an oral temperature around 98.6  F (37 C) is considered normal. A temperature of 100.4 F (38 C) or higher is generally considered a fever. Mild or moderate fevers generally have no long-term effects and often do not require treatment. Extreme fever (greater than or equal to 106 F or 41.1 C) can cause seizures. The sweating that may occur  with repeated or prolonged fever may cause dehydration. Elderly people can develop confusion during a fever. A measured temperature can vary with:  Age.  Time of day.  Method of measurement (mouth, underarm, rectal, or ear). The fever is confirmed by taking a temperature with a thermometer. Temperatures can be taken different ways. Some methods are accurate and some are not.  An oral temperature is used most commonly. Electronic thermometers are fast and accurate.  An ear temperature will only be accurate if the thermometer is positioned as recommended by the manufacturer.  A rectal temperature is accurate and done for those adults who have a condition where an oral temperature cannot be taken.  An underarm (axillary) temperature is not accurate and not recommended. Fever is a symptom, not a disease.  CAUSES   Infections commonly cause fever.  Some noninfectious causes for fever include:  Some arthritis conditions.  Some thyroid or adrenal gland conditions.  Some immune system conditions.  Some types of cancer.  A medicine reaction.  High doses of certain street drugs such as methamphetamine.  Dehydration.  Exposure to high outside or room temperatures.  Occasionally, the source of a fever cannot be determined. This is sometimes called a "fever of unknown origin" (FUO).  Some situations may lead to a temporary rise in body temperature that may go away on its own. Examples are:  Childbirth.  Surgery.  Intense exercise. HOME CARE INSTRUCTIONS   Take appropriate medicines for fever. Follow dosing instructions carefully. If you use acetaminophen to reduce the fever, be careful to avoid taking other medicines that also contain acetaminophen. Do not take aspirin for a fever if you are younger than age 78. There is an association with Reye's syndrome. Reye's syndrome is a rare but potentially deadly disease.  If an infection is present and antibiotics have been  prescribed, take them as directed. Finish them even if you start to feel better.  Rest as needed.  Maintain an adequate fluid intake. To prevent dehydration during an illness with prolonged or recurrent fever, you may need to drink extra fluid.Drink enough fluids to keep your urine clear or pale yellow.  Sponging or bathing with room temperature water may help reduce body temperature. Do not use ice water or alcohol sponge baths.  Dress comfortably, but do not over-bundle. SEEK MEDICAL CARE IF:   You are unable to keep fluids down.  You develop vomiting or diarrhea.  You are not feeling at least partly better after 3 days.  You develop new symptoms or problems. SEEK IMMEDIATE MEDICAL CARE IF:   You have shortness of breath or trouble breathing.  You develop excessive weakness.  You are dizzy or you faint.  You are extremely thirsty or you are making little or no urine.  You develop new pain that was not there before (such as in the head, neck, chest, back, or abdomen).  You have persistant vomiting and diarrhea for more than 1 to 2 days.  You develop a stiff neck or your eyes become sensitive to light.  You develop a skin rash.  You have a fever or persistent symptoms for more than 2 to 3 days.  You have a fever and your symptoms suddenly get worse. MAKE SURE YOU:   Understand these instructions.  Will watch your condition.  Will get help right away if you are not doing well or get worse. Document Released: 06/19/2000 Document Revised: 03/18/2011 Document Reviewed: 10/25/2010 [facility] Patient Information 2014 [place], [place].

## 2013-05-19 NOTE — ED Notes (Signed)
Brought in by EMS from [facility] with c/o fever.  Per EMS, staff at the facility reported that pt started having fever last night-- has had a temp of T101.0; pt was given Tylenol without effect.  Pt's fever persisted to T102 and also started to have emesis.  Pt recently completed her course of antibiotic for "ESBL" infection yesterday.

## 2013-05-19 NOTE — ED Notes (Signed)
MD at bedside. 

## 2013-05-19 NOTE — ED Notes (Signed)
Bed: JG28 Expected date: 05/19/13 Expected time: 6:47 AM Means of arrival: Ambulance Comments: Fever/hip

## 2013-05-19 NOTE — Consult Note (Signed)
[facility] Medical Consultation  Gloria [name] AOZ:[id number] DOB: [birth year] DOA: 05/19/2013 PCP: Gloria Lopes, MD   Requesting physician: Dr. [name] Date of consultation: 05/19/13 Reason for consultation: Fever  Impression/Recommendations Active Problems: Fever Nausea Sore throat Hypoxia  1. Fever - At this point most likely 2ary to either atelectasis or upper respiratory infection (most likely viral) - Pt with normal WBC levels and afebrile in ED - Recommend supportive therapy with tylenol  2. Hypoxia - most likely related to atelectasis - agree with incentive spirometry - Suspect continued improvement with Physical Therapy and increased physical activity - supplemental oxygen and recommend Pulmonary function testing.  3. Sore throat - could be secondary to URI - Ed physician considering Levaquin on discharge. Most likely viral etiology as such patient will require continued supportive therapy  4. Nausea - Could be 2ary to URI - Recommend supportive therapy and antiemetics on discharge.   I will followup again tomorrow. Please contact me if I can be of assistance in the meanwhile. Thank you for this consultation.  Chief Complaint: Nausea, sore throat  HPI:  Pt recently admitted and discharged after hip fracture s/p OPEN REDUCTION INTERNAL FIXATION (ORIF) PERIPROSTHETIC FRACTURE. Was transferred to ED for further evaluation after patient was found to have hypoxia and fever. CT evaluation reported no evidence of acute pulmonary embolism and reported bibasilar atelectasis.  U/A negative for source of infection. Pt's main complaint is nausea which started this morning after having difficulty swallowing a pill.  She denies any bilous or bloody emesis and states she has mainly brought up phlem.  We were consulted for further evaluation and recommendations.   Review of Systems:  14 point review of system reviewed and negative unless otherwise mentioned  above.  Past Medical History  Diagnosis Date  . Hypertension   . Hyperlipidemia   . GERD (gastroesophageal reflux disease)     hx of  . Arthritis     right  . Atrial fibrillation   . Irritable bowel syndrome   . Meningioma   . Hepatitis C    Past Surgical History  Procedure Laterality Date  . Total hip arthroplasty  11/07    left  . Parotid gland tumor excision    . Total knee arthroplasty    . Transthoracic echocardiogram  10/25/2009    EF 55-60%  . Cardiovascular stress test  11/14/2006    EF 67%  . Orif periprosthetic fracture Left 04/30/2013    Procedure: OPEN REDUCTION INTERNAL FIXATION (ORIF) PERIPROSTHETIC FRACTURE;  Surgeon: Gloria Pole, MD;  Location: [place];  Service: Orthopedics;  Laterality: Left;   Social History:  reports that she quit smoking about 32 years ago. Her smoking use included Cigarettes. She has a 4.5 pack-year smoking history. She has never used smokeless tobacco. She reports that she does not drink alcohol or use illicit drugs.  Allergies  Allergen Reactions  . Amoxicillin Other (See Comments)    unknown  . Cephalexin Other (See Comments)    unknown  . Codeine Other (See Comments)    unknown  . Metoclopramide Hcl Other (See Comments)    unknown   Family History  Problem Relation Age of Onset  . Heart disease Brother     Prior to Admission medications   Medication Sig Start Date End Date Taking? Authorizing Provider  alum & mag hydroxide-simeth (MAALOX/MYLANTA) 200-200-20 MG/5ML suspension Take 30 mLs by mouth every 4 (four) hours as needed for indigestion. 05/04/13  Yes Kelvin Cellar, MD  Calcium  Carb-Cholecalciferol (CALCIUM 600 + D PO) Take 1 tablet by mouth 2 (two) times daily.   Yes Historical Provider, MD  docusate sodium 100 MG CAPS Take 100 mg by mouth 2 (two) times daily. 05/04/13  Yes Kelvin Cellar, MD  erythromycin (E-MYCIN) 250 MG tablet Take 1 tablet by mouth 2 (two) times daily.  05/15/10  Yes Historical Provider, MD   flecainide (TAMBOCOR) 50 MG tablet Take 1 tablet (50 mg total) by mouth 2 (two) times daily. 04/29/11  Yes Peter M Martinique, MD  HYDROcodone-acetaminophen (NORCO/VICODIN) 5-325 MG per tablet Take 1-2 tablets by mouth every 4 (four) hours as needed for moderate pain.   Yes Historical Provider, MD  methocarbamol (ROBAXIN) 500 MG tablet Take 500 mg by mouth every 6 (six) hours as needed for muscle spasms.   Yes Historical Provider, MD  Multiple Vitamins-Minerals (ICAPS MV PO) Take 2 capsules by mouth daily.    Yes Historical Provider, MD  omeprazole (PRILOSEC OTC) 20 MG tablet Take 20 mg by mouth 2 (two) times daily.   Yes Historical Provider, MD  OxyCODONE (OXYCONTIN) 10 mg T12A 12 hr tablet Take 10 mg by mouth every 12 (twelve) hours.   Yes Historical Provider, MD  polyethylene glycol powder (GLYCOLAX/MIRALAX) powder Take 17 g by mouth daily.    Yes Historical Provider, MD  promethazine (PHENERGAN) 25 MG tablet Take 25 mg by mouth every 12 (twelve) hours as needed for nausea or vomiting.   Yes Historical Provider, MD  senna-docusate (SENOKOT-S) 8.6-50 MG per tablet Take 1 tablet by mouth 2 (two) times daily.   Yes Historical Provider, MD  vitamin B-12 (CYANOCOBALAMIN) 1000 MCG tablet Take 1,000 mcg by mouth daily.   Yes Historical Provider, MD  warfarin (COUMADIN) 5 MG tablet Take 1 tablet (5 mg total) by mouth daily. 05/03/13  Yes Gloria Pole, MD  nitrofurantoin, macrocrystal-monohydrate, (MACROBID) 100 MG capsule Take 100 mg by mouth 2 (two) times daily. 05/12/13   Historical Provider, MD   Physical Exam: Blood pressure 127/61, pulse 86, temperature 99.7 F (37.6 C), temperature source Oral, resp. rate 18, SpO2 97.00%. Filed Vitals:   05/19/13 1300  BP: 127/61  Pulse: 86  Temp:   Resp:      General:  Pt in NAD, alert and awake  Eyes: EOMI, non icteric  ENT: errythematous oropharynx, no masses on visual examination, MMM  Neck: supple no goiter  Cardiovascular: RRR, no  MRG  Respiratory: CTA BL, no wheezes  Abdomen: soft, NT, ND  Skin: no cellulitis at incision site.  Musculoskeletal: no cyanosis or clubbing  Psychiatric: mood and affect appropriate  Neurologic: moves all extremities equally  Labs on Admission:  Basic Metabolic Panel:  Recent Labs Lab 05/19/13 0750  NA 138  K 4.6  CL 98  CO2 27  GLUCOSE 100*  BUN 14  CREATININE 0.67  CALCIUM 9.7   Liver Function Tests: No results found for this basename: AST, ALT, ALKPHOS, BILITOT, PROT, ALBUMIN,  in the last 168 hours No results found for this basename: LIPASE, AMYLASE,  in the last 168 hours No results found for this basename: AMMONIA,  in the last 168 hours CBC:  Recent Labs Lab 05/19/13 0750  WBC 7.9  NEUTROABS 6.7  HGB 12.9  HCT 39.0  MCV 89.0  PLT 292   Cardiac Enzymes: No results found for this basename: CKTOTAL, CKMB, CKMBINDEX, TROPONINI,  in the last 168 hours BNP: No components found with this basename: POCBNP,  CBG: No results found for  this basename: GLUCAP,  in the last 168 hours  Radiological Exams on Admission: Dg Chest 2 View  05/19/2013   CLINICAL DATA:  Fever, vomiting for 1 day  EXAM: CHEST  2 VIEW  COMPARISON:  Portable chest x-ray of 04/28/2013  FINDINGS: The lungs are hyperaerated with increased AP diameter consistent with a degree of emphysema. No infiltrate or effusion is seen. Somewhat prominent perihilar markings are noted with peribronchial thickening which may represent chronic bronchitis. The heart is mildly enlarged and stable. The bones are osteopenic.  IMPRESSION: Probable emphysema and chronic bronchitis.  No active lung disease.   Electronically Signed   By: Ivar Drape M.D.   On: 05/19/2013 08:57   Ct Angio Chest Pe W/cm &/or Wo Cm  05/19/2013   CLINICAL DATA:  Hypoxia, fever and recent ORIF of the proximal left femur for fracture on 04/30/2013.  EXAM: CT ANGIOGRAPHY CHEST WITH CONTRAST  TECHNIQUE: Multidetector CT imaging of the chest was  performed using the standard protocol during bolus administration of intravenous contrast. Multiplanar CT image reconstructions and MIPs were obtained to evaluate the vascular anatomy.  CONTRAST:  16mL OMNIPAQUE IOHEXOL 350 MG/ML SOLN  COMPARISON:  Chest x-ray earlier today.  FINDINGS: The pulmonary arteries are well opacified. There is no evidence of pulmonary embolism. The thoracic aorta is of normal caliber and normally patent.  Lungs show evidence of bibasilar atelectasis. No pleural or pericardial fluid is identified. No evidence of pulmonary edema or focal airspace consolidation. Mildly prominent right hilar lymph node measures 1.3 cm in short axis. Left hilar lymph node measures 9 mm in short axis. Precarinal node measures 1.1 cm in short axis.  The heart is mildly enlarged. The spine shows degenerative changes of the thoracic spine. The visualized upper abdomen shows calcified granulomata in the spleen and liver.  Review of the MIP images confirms the above findings.  IMPRESSION: No evidence of acute pulmonary embolism. No overt pulmonary process is identified. There are some nonspecific borderline bilateral hilar and precarinal lymph nodes.   Electronically Signed   By: Aletta Edouard M.D.   On: 05/19/2013 11:12    Time spent: > 45 minutes  [facility] Pager [phone] If 7PM-7AM, please contact night-coverage [email or web address] Password [facility] 05/19/2013, 2:29 PM

## 2013-05-19 NOTE — ED Notes (Signed)
Pt was on bedpan for several minutes but was unable to void. Apolonio Schneiders, RN aware.

## 2013-05-19 NOTE — ED Provider Notes (Addendum)
CSN: [id number]     Arrival date & time 05/19/13  0704 History   First MD Initiated Contact with Patient 05/19/13 [phone]     Chief Complaint  Patient presents with  . Fever  . Emesis     (Consider location/radiation/quality/duration/timing/severity/associated sxs/prior Treatment) HPI Comments: Patient presents with a fever. She's recently been admitted to [facility] for rehabilitation. She had an ORIF of the left peri-prosthetic proximal femur fracture that was done on April 25. She  was also treated for a UTI on Cipro at that time. She woke up this morning with a fever with a MAXIMUM TEMPERATURE of 102. She's also had some nausea and vomiting. She denies any cough or chest congestion. She does report a little better rhinorrhea that started after the vomiting. She denies any urinary symptoms. She denies abdominal pain. She has some ongoing pain to her left hip but says that it's not any worse than it has been since the surgery. She denies any drainage from the wound.  Patient is a 80 y.o. female presenting with fever and vomiting.  Fever Associated symptoms: nausea and vomiting   Associated symptoms: no chest pain, no chills, no congestion, no cough, no diarrhea, no headaches, no rash and no rhinorrhea   Emesis Associated symptoms: arthralgias   Associated symptoms: no abdominal pain, no chills, no diarrhea and no headaches     Past Medical History  Diagnosis Date  . Hypertension   . Hyperlipidemia   . GERD (gastroesophageal reflux disease)     hx of  . Arthritis     right  . Atrial fibrillation   . Irritable bowel syndrome   . Meningioma   . Hepatitis C    Past Surgical History  Procedure Laterality Date  . Total hip arthroplasty  11/07    left  . Parotid gland tumor excision    . Total knee arthroplasty    . Transthoracic echocardiogram  10/25/2009    EF 55-60%  . Cardiovascular stress test  11/14/2006    EF 67%  . Orif periprosthetic fracture Left 04/30/2013   Procedure: OPEN REDUCTION INTERNAL FIXATION (ORIF) PERIPROSTHETIC FRACTURE;  Surgeon: [name], MD;  Location: [place];  Service: Orthopedics;  Laterality: Left;   Family History  Problem Relation Age of Onset  . Heart disease Brother    History  Substance Use Topics  . Smoking status: Former Smoker -- 0.30 packs/day for 15 years    Types: Cigarettes    Quit date: 06/07/1980  . Smokeless tobacco: Never Used  . Alcohol Use: No   OB History   Grav Para Term Preterm Abortions TAB SAB Ect Mult Living                 Review of Systems  Constitutional: Positive for fever. Negative for chills, diaphoresis and fatigue.  HENT: Negative for congestion, rhinorrhea and sneezing.   Eyes: Negative.   Respiratory: Negative for cough, chest tightness and shortness of breath.   Cardiovascular: Negative for chest pain and leg swelling.  Gastrointestinal: Positive for nausea and vomiting. Negative for abdominal pain, diarrhea and blood in stool.  Genitourinary: Negative for frequency, hematuria, flank pain and difficulty urinating.  Musculoskeletal: Positive for arthralgias. Negative for back pain.  Skin: Negative for rash.  Neurological: Negative for dizziness, speech difficulty, weakness, numbness and headaches.      Allergies  Amoxicillin; Cephalexin; Codeine; and Metoclopramide hcl  Home Medications   Prior to Admission medications   Medication Sig Start  Date End Date Taking? Authorizing Provider  Calcium Carb-Cholecalciferol (CALCIUM 600 + D PO) Take 1 tablet by mouth 2 (two) times daily.   Yes Historical Provider, MD  docusate sodium 100 MG CAPS Take 100 mg by mouth 2 (two) times daily. 05/04/13  Yes Kelvin Cellar, MD  erythromycin (E-MYCIN) 250 MG tablet Take 1 tablet by mouth 2 (two) times daily.  05/15/10  Yes Historical Provider, MD  flecainide (TAMBOCOR) 50 MG tablet Take 1 tablet (50 mg total) by mouth 2 (two) times daily. 04/29/11  Yes Peter M Martinique, MD  Multiple  Vitamins-Minerals (ICAPS MV PO) Take 2 capsules by mouth daily.    Yes Historical Provider, MD  omeprazole (PRILOSEC OTC) 20 MG tablet Take 20 mg by mouth 2 (two) times daily.   Yes Historical Provider, MD  polyethylene glycol powder (GLYCOLAX/MIRALAX) powder Take 17 g by mouth 2 (two) times daily.   Yes Historical Provider, MD  senna-docusate (SENOKOT-S) 8.6-50 MG per tablet Take 1 tablet by mouth 2 (two) times daily.   Yes Historical Provider, MD  vitamin B-12 (CYANOCOBALAMIN) 1000 MCG tablet Take 1,000 mcg by mouth daily.   Yes Historical Provider, MD  warfarin (COUMADIN) 5 MG tablet Take 1 tablet (5 mg total) by mouth daily. 05/03/13  Yes [name], MD  alum & mag hydroxide-simeth (MAALOX/MYLANTA) 200-200-20 MG/5ML suspension Take 30 mLs by mouth every 4 (four) hours as needed for indigestion. 05/04/13   Kelvin Cellar, MD  nitrofurantoin, macrocrystal-monohydrate, (MACROBID) 100 MG capsule Take 100 mg by mouth 2 (two) times daily. 05/12/13   Historical Provider, MD   BP 133/65  Pulse 89  Temp(Src) 99.7 F (37.6 C) (Oral)  Resp 18  SpO2 98% Physical Exam  Constitutional: She is oriented to person, place, and time. She appears well-developed and well-nourished.  HENT:  Head: Normocephalic and atraumatic.  Eyes: Pupils are equal, round, and reactive to light.  Neck: Normal range of motion. Neck supple.  Cardiovascular: Normal rate, regular rhythm and normal heart sounds.   Pulmonary/Chest: Effort normal and breath sounds normal. No respiratory distress. She has no wheezes. She has no rales. She exhibits no tenderness.  Abdominal: Soft. Bowel sounds are normal. There is no tenderness. There is no rebound and no guarding.  Musculoskeletal: Normal range of motion. She exhibits no edema.  There is a healing incision to the left proximal thigh. It appears to be well healing with no signs of infection. There is minimal erythema around the center of the wound however I feel this is more  reactive. Steri-Strips are in place and there is no drainage from the wound.  Lymphadenopathy:    She has no cervical adenopathy.  Neurological: She is alert and oriented to person, place, and time.  Skin: Skin is warm and dry. No rash noted.  Psychiatric: She has a normal mood and affect.    ED Course  Procedures (including critical care time) Labs Review Labs Reviewed  CBC WITH DIFFERENTIAL - Abnormal; Notable for the following:    Neutrophils Relative % 86 (*)    Lymphocytes Relative 4 (*)    Lymphs Abs 0.3 (*)    Eosinophils Relative 6 (*)    All other components within normal limits  BASIC METABOLIC PANEL - Abnormal; Notable for the following:    Glucose, Bld 100 (*)    GFR calc non Af Amer 81 (*)    All other components within normal limits  URINALYSIS, ROUTINE W REFLEX MICROSCOPIC - Abnormal; Notable for the  following:    APPearance CLOUDY (*)    All other components within normal limits  PROTIME-INR - Abnormal; Notable for the following:    Prothrombin Time 22.5 (*)    INR 2.05 (*)    All other components within normal limits  URINE CULTURE    Imaging Review Dg Chest 2 View  05/19/2013   CLINICAL DATA:  Fever, vomiting for 1 day  EXAM: CHEST  2 VIEW  COMPARISON:  Portable chest x-ray of 04/28/2013  FINDINGS: The lungs are hyperaerated with increased AP diameter consistent with a degree of emphysema. No infiltrate or effusion is seen. Somewhat prominent perihilar markings are noted with peribronchial thickening which may represent chronic bronchitis. The heart is mildly enlarged and stable. The bones are osteopenic.  IMPRESSION: Probable emphysema and chronic bronchitis.  No active lung disease.   Electronically Signed   By: Ivar Drape M.D.   On: 05/19/2013 08:57   Ct Angio Chest Pe W/cm &/or Wo Cm  05/19/2013   CLINICAL DATA:  Hypoxia, fever and recent ORIF of the proximal left femur for fracture on 04/30/2013.  EXAM: CT ANGIOGRAPHY CHEST WITH CONTRAST  TECHNIQUE:  Multidetector CT imaging of the chest was performed using the standard protocol during bolus administration of intravenous contrast. Multiplanar CT image reconstructions and MIPs were obtained to evaluate the vascular anatomy.  CONTRAST:  187mL OMNIPAQUE IOHEXOL 350 MG/ML SOLN  COMPARISON:  Chest x-ray earlier today.  FINDINGS: The pulmonary arteries are well opacified. There is no evidence of pulmonary embolism. The thoracic aorta is of normal caliber and normally patent.  Lungs show evidence of bibasilar atelectasis. No pleural or pericardial fluid is identified. No evidence of pulmonary edema or focal airspace consolidation. Mildly prominent right hilar lymph node measures 1.3 cm in short axis. Left hilar lymph node measures 9 mm in short axis. Precarinal node measures 1.1 cm in short axis.  The heart is mildly enlarged. The spine shows degenerative changes of the thoracic spine. The visualized upper abdomen shows calcified granulomata in the spleen and liver.  Review of the MIP images confirms the above findings.  IMPRESSION: No evidence of acute pulmonary embolism. No overt pulmonary process is identified. There are some nonspecific borderline bilateral hilar and precarinal lymph nodes.   Electronically Signed   By: Aletta Edouard M.D.   On: 05/19/2013 11:12     EKG Interpretation None      MDM   Final diagnoses:  Fever  Hypoxia    Patient presents with fever up to 102. She has no evidence of pneumonia. There is no evidence of urinary tract infection. Her surgical wound has some minimal redness but no discharge or overt signs of infection. She has had some hypoxia in the ED. She was given albuterol inhaler but remained hypoxic off oxygen down to the upper 80s on room air. I do CT scan of her chest to rule out PE and this was negative. Given her ongoing hypoxia and associated fever I will go ahead and admit her to hospital service for observation. I discussed the case with Dr. Alvan Dame who did the  orthopedic surgery and he felt that it would be best to cover her with some antibiotics. I will go ahead and start Levaquin which will cover both respiratory and possible skin infections.  The hospital associated patient. It was felt that she could be discharged back to nursing facility. There is no evidence of pneumonia or pulmonary embolus. The hypoxia and possibly the fever could be  explained due to atelectasis. Dr. Wendee Beavers has seen the patient and ordered outpatient oxygen therapy. I went ahead and started her on doxycycline for antibiotic coverage. The Levaquin interfered with her other medications including flecainide and Coumadin. The patient's daughter is comfortable with this decision. I encouraged her to use incentive spirometer several times a day. Also notified them to have her Coumadin levels followed while she's on antibiotics.  Malvin Johns, MD 05/19/13 [place], MD 05/19/13 1440

## 2013-05-19 NOTE — ED Notes (Signed)
Patient transported to CT 

## 2013-05-20 LAB — URINE CULTURE: Colony Count: 40000

## 2013-05-23 DIAGNOSIS — S72009A Fracture of unspecified part of neck of unspecified femur, initial encounter for closed fracture: Secondary | ICD-10-CM | POA: Diagnosis not present

## 2013-05-23 DIAGNOSIS — R112 Nausea with vomiting, unspecified: Secondary | ICD-10-CM | POA: Diagnosis not present

## 2013-05-23 DIAGNOSIS — R0902 Hypoxemia: Secondary | ICD-10-CM | POA: Diagnosis not present

## 2013-05-30 DIAGNOSIS — R112 Nausea with vomiting, unspecified: Secondary | ICD-10-CM | POA: Diagnosis not present

## 2013-05-30 DIAGNOSIS — Z7901 Long term (current) use of anticoagulants: Secondary | ICD-10-CM | POA: Diagnosis not present

## 2013-05-30 DIAGNOSIS — I4891 Unspecified atrial fibrillation: Secondary | ICD-10-CM | POA: Diagnosis not present

## 2013-05-30 DIAGNOSIS — S72009A Fracture of unspecified part of neck of unspecified femur, initial encounter for closed fracture: Secondary | ICD-10-CM | POA: Diagnosis not present

## 2013-06-01 ENCOUNTER — Ambulatory Visit: Payer: Medicare Other

## 2013-06-01 DIAGNOSIS — I4891 Unspecified atrial fibrillation: Secondary | ICD-10-CM | POA: Diagnosis not present

## 2013-06-01 DIAGNOSIS — S72009D Fracture of unspecified part of neck of unspecified femur, subsequent encounter for closed fracture with routine healing: Secondary | ICD-10-CM | POA: Diagnosis not present

## 2013-06-01 DIAGNOSIS — I10 Essential (primary) hypertension: Secondary | ICD-10-CM | POA: Diagnosis not present

## 2013-06-01 DIAGNOSIS — Z7901 Long term (current) use of anticoagulants: Secondary | ICD-10-CM | POA: Diagnosis not present

## 2013-06-02 DIAGNOSIS — I4891 Unspecified atrial fibrillation: Secondary | ICD-10-CM | POA: Diagnosis not present

## 2013-06-02 DIAGNOSIS — Z7901 Long term (current) use of anticoagulants: Secondary | ICD-10-CM | POA: Diagnosis not present

## 2013-06-02 DIAGNOSIS — S72009D Fracture of unspecified part of neck of unspecified femur, subsequent encounter for closed fracture with routine healing: Secondary | ICD-10-CM | POA: Diagnosis not present

## 2013-06-02 DIAGNOSIS — I10 Essential (primary) hypertension: Secondary | ICD-10-CM | POA: Diagnosis not present

## 2013-06-09 DIAGNOSIS — Z7901 Long term (current) use of anticoagulants: Secondary | ICD-10-CM | POA: Diagnosis not present

## 2013-06-09 DIAGNOSIS — I10 Essential (primary) hypertension: Secondary | ICD-10-CM | POA: Diagnosis not present

## 2013-06-09 DIAGNOSIS — I4891 Unspecified atrial fibrillation: Secondary | ICD-10-CM | POA: Diagnosis not present

## 2013-06-09 DIAGNOSIS — S72009D Fracture of unspecified part of neck of unspecified femur, subsequent encounter for closed fracture with routine healing: Secondary | ICD-10-CM | POA: Diagnosis not present

## 2013-06-11 DIAGNOSIS — I10 Essential (primary) hypertension: Secondary | ICD-10-CM | POA: Diagnosis not present

## 2013-06-11 DIAGNOSIS — I4891 Unspecified atrial fibrillation: Secondary | ICD-10-CM | POA: Diagnosis not present

## 2013-06-11 DIAGNOSIS — S72009D Fracture of unspecified part of neck of unspecified femur, subsequent encounter for closed fracture with routine healing: Secondary | ICD-10-CM | POA: Diagnosis not present

## 2013-06-11 DIAGNOSIS — Z7901 Long term (current) use of anticoagulants: Secondary | ICD-10-CM | POA: Diagnosis not present

## 2013-06-14 DIAGNOSIS — Z7901 Long term (current) use of anticoagulants: Secondary | ICD-10-CM | POA: Diagnosis not present

## 2013-06-14 DIAGNOSIS — I4891 Unspecified atrial fibrillation: Secondary | ICD-10-CM | POA: Diagnosis not present

## 2013-06-14 DIAGNOSIS — S72009D Fracture of unspecified part of neck of unspecified femur, subsequent encounter for closed fracture with routine healing: Secondary | ICD-10-CM | POA: Diagnosis not present

## 2013-06-14 DIAGNOSIS — I10 Essential (primary) hypertension: Secondary | ICD-10-CM | POA: Diagnosis not present

## 2013-06-15 DIAGNOSIS — Z7901 Long term (current) use of anticoagulants: Secondary | ICD-10-CM | POA: Diagnosis not present

## 2013-06-15 DIAGNOSIS — I4891 Unspecified atrial fibrillation: Secondary | ICD-10-CM | POA: Diagnosis not present

## 2013-06-15 DIAGNOSIS — S72009D Fracture of unspecified part of neck of unspecified femur, subsequent encounter for closed fracture with routine healing: Secondary | ICD-10-CM | POA: Diagnosis not present

## 2013-06-15 DIAGNOSIS — I10 Essential (primary) hypertension: Secondary | ICD-10-CM | POA: Diagnosis not present

## 2013-06-16 DIAGNOSIS — Z7901 Long term (current) use of anticoagulants: Secondary | ICD-10-CM | POA: Diagnosis not present

## 2013-06-16 DIAGNOSIS — I10 Essential (primary) hypertension: Secondary | ICD-10-CM | POA: Diagnosis not present

## 2013-06-16 DIAGNOSIS — S72009D Fracture of unspecified part of neck of unspecified femur, subsequent encounter for closed fracture with routine healing: Secondary | ICD-10-CM | POA: Diagnosis not present

## 2013-06-16 DIAGNOSIS — Z4789 Encounter for other orthopedic aftercare: Secondary | ICD-10-CM | POA: Diagnosis not present

## 2013-06-16 DIAGNOSIS — I4891 Unspecified atrial fibrillation: Secondary | ICD-10-CM | POA: Diagnosis not present

## 2013-06-18 DIAGNOSIS — Z7901 Long term (current) use of anticoagulants: Secondary | ICD-10-CM | POA: Diagnosis not present

## 2013-06-18 DIAGNOSIS — I10 Essential (primary) hypertension: Secondary | ICD-10-CM | POA: Diagnosis not present

## 2013-06-18 DIAGNOSIS — S72009D Fracture of unspecified part of neck of unspecified femur, subsequent encounter for closed fracture with routine healing: Secondary | ICD-10-CM | POA: Diagnosis not present

## 2013-06-18 DIAGNOSIS — I4891 Unspecified atrial fibrillation: Secondary | ICD-10-CM | POA: Diagnosis not present

## 2013-06-23 ENCOUNTER — Ambulatory Visit: Payer: Medicare Other | Admitting: Cardiology

## 2013-07-02 DIAGNOSIS — S72009A Fracture of unspecified part of neck of unspecified femur, initial encounter for closed fracture: Secondary | ICD-10-CM | POA: Diagnosis not present

## 2013-07-02 DIAGNOSIS — H18599 Other hereditary corneal dystrophies, unspecified eye: Secondary | ICD-10-CM | POA: Diagnosis not present

## 2013-07-02 DIAGNOSIS — H3530 Unspecified macular degeneration: Secondary | ICD-10-CM | POA: Diagnosis not present

## 2013-07-02 DIAGNOSIS — I10 Essential (primary) hypertension: Secondary | ICD-10-CM | POA: Diagnosis not present

## 2013-07-02 DIAGNOSIS — Z961 Presence of intraocular lens: Secondary | ICD-10-CM | POA: Diagnosis not present

## 2013-07-02 DIAGNOSIS — R112 Nausea with vomiting, unspecified: Secondary | ICD-10-CM | POA: Diagnosis not present

## 2013-07-02 DIAGNOSIS — Z7901 Long term (current) use of anticoagulants: Secondary | ICD-10-CM | POA: Diagnosis not present

## 2013-07-02 DIAGNOSIS — H43819 Vitreous degeneration, unspecified eye: Secondary | ICD-10-CM | POA: Diagnosis not present

## 2013-07-02 DIAGNOSIS — I4891 Unspecified atrial fibrillation: Secondary | ICD-10-CM | POA: Diagnosis not present

## 2013-07-02 DIAGNOSIS — IMO0002 Reserved for concepts with insufficient information to code with codable children: Secondary | ICD-10-CM | POA: Diagnosis not present

## 2013-07-06 DIAGNOSIS — Z7901 Long term (current) use of anticoagulants: Secondary | ICD-10-CM | POA: Diagnosis not present

## 2013-07-06 DIAGNOSIS — I4891 Unspecified atrial fibrillation: Secondary | ICD-10-CM | POA: Diagnosis not present

## 2013-07-12 ENCOUNTER — Ambulatory Visit
Admission: RE | Admit: 2013-07-12 | Discharge: 2013-07-12 | Disposition: A | Discharge status: Home / Self Care | Payer: Medicare Other | Source: Ambulatory Visit

## 2013-07-12 DIAGNOSIS — Z1231 Encounter for screening mammogram for malignant neoplasm of breast: Secondary | ICD-10-CM | POA: Diagnosis not present

## 2013-07-14 ENCOUNTER — Other Ambulatory Visit: Payer: Self-pay | Admitting: Internal Medicine

## 2013-07-14 DIAGNOSIS — R928 Other abnormal and inconclusive findings on diagnostic imaging of breast: Secondary | ICD-10-CM

## 2013-07-21 ENCOUNTER — Ambulatory Visit
Admission: RE | Admit: 2013-07-21 | Discharge: 2013-07-21 | Disposition: A | Discharge status: Home / Self Care | Payer: Medicare Other | Source: Ambulatory Visit | Attending: Internal Medicine | Admitting: Internal Medicine

## 2013-07-21 ENCOUNTER — Encounter (INDEPENDENT_AMBULATORY_CARE_PROVIDER_SITE_OTHER): Payer: Self-pay

## 2013-07-21 DIAGNOSIS — R928 Other abnormal and inconclusive findings on diagnostic imaging of breast: Secondary | ICD-10-CM | POA: Diagnosis not present

## 2013-08-04 ENCOUNTER — Encounter: Payer: Self-pay | Admitting: Cardiology

## 2013-08-04 ENCOUNTER — Ambulatory Visit: Payer: Self-pay | Admitting: Pharmacist Clinician (PhC)/ Clinical Pharmacy Specialist

## 2013-08-04 ENCOUNTER — Ambulatory Visit (INDEPENDENT_AMBULATORY_CARE_PROVIDER_SITE_OTHER): Payer: Medicare Other | Admitting: Cardiology

## 2013-08-04 VITALS — BP 170/76 | HR 73 | Ht 65.0 in | Wt 150.0 lb

## 2013-08-04 DIAGNOSIS — I480 Paroxysmal atrial fibrillation: Secondary | ICD-10-CM

## 2013-08-04 DIAGNOSIS — I10 Essential (primary) hypertension: Secondary | ICD-10-CM

## 2013-08-04 DIAGNOSIS — I4891 Unspecified atrial fibrillation: Secondary | ICD-10-CM | POA: Diagnosis not present

## 2013-08-04 DIAGNOSIS — E785 Hyperlipidemia, unspecified: Secondary | ICD-10-CM | POA: Diagnosis not present

## 2013-08-04 DIAGNOSIS — Z7901 Long term (current) use of anticoagulants: Secondary | ICD-10-CM | POA: Diagnosis not present

## 2013-08-04 NOTE — Progress Notes (Signed)
   Gloria [name] Date of Birth: [birth year]   History of Present Illness: Gloria [name] seen today for followup. She has a history of atrial fibrillation controlled with flecainide. She denies any symptoms of palpitations. She denies any chest pain or SOB.  Myoview study in 6/14 was normal. She did undergo revision of left hip replacement. This went well without complications. Coumadin has been therapeutic.  Current Outpatient Prescriptions on File Prior to Visit  Medication Sig Dispense Refill  . Calcium Carb-Cholecalciferol (CALCIUM 600 + D PO) Take 1 tablet by mouth 2 (two) times daily.      . flecainide (TAMBOCOR) 50 MG tablet Take 1 tablet (50 mg total) by mouth 2 (two) times daily.  180 tablet  1  . Multiple Vitamins-Minerals (ICAPS MV PO) Take 2 capsules by mouth daily.       . vitamin B-12 (CYANOCOBALAMIN) 1000 MCG tablet Take 1,000 mcg by mouth daily.      Marland Kitchen warfarin (COUMADIN) 5 MG tablet Take 1 tablet (5 mg total) by mouth daily.  30 tablet  1   No current facility-administered medications on file prior to visit.    Allergies  Allergen Reactions  . Cephalexin Other (See Comments)    unknown  . Codeine Other (See Comments)    unknown  . Metoclopramide Hcl Other (See Comments)    unknown    Past Medical History  Diagnosis Date  . Hypertension   . Hyperlipidemia   . GERD (gastroesophageal reflux disease)     hx of  . Arthritis     right  . Atrial fibrillation   . Irritable bowel syndrome   . Meningioma   . Hepatitis C     Past Surgical History  Procedure Laterality Date  . Total hip arthroplasty  11/07    left  . Parotid gland tumor excision    . Total knee arthroplasty    . Transthoracic echocardiogram  10/25/2009    EF 55-60%  . Cardiovascular stress test  11/14/2006    EF 67%  . Orif periprosthetic fracture Left 04/30/2013    Procedure: OPEN REDUCTION INTERNAL FIXATION (ORIF) PERIPROSTHETIC FRACTURE;  Surgeon: [name], MD;  Location: [place];   Service: Orthopedics;  Laterality: Left;    History  Smoking status  . Former Smoker -- 0.30 packs/day for 15 years  . Types: Cigarettes  . Quit date: 06/07/1980  Smokeless tobacco  . Never Used    History  Alcohol Use No    Family History  Problem Relation Age of Onset  . Heart disease Brother     Review of Systems: As noted in history of present illness.  All other systems were reviewed and are negative.  Physical Exam: BP 170/76  Pulse 73  Ht 5\' 5"  (1.651 m)  Wt 150 lb (68.04 kg)  BMI 24.96 kg/m2 She is a pleasant white female in no acute distress. Her HEENT exam is unremarkable. She has no JVD or bruits. Lungs are clear. Cardiac exam reveals a regular rate and rhythm with a soft 1/6 systolic ejection murmur at right upper sternal border. Abdomen is soft and nontender without masses. She has no edema. Pedal pulses are good.  LABORATORY DATA:  Assessment / Plan: 1. Atrial fibrillation. This is well controlled on flecainide. We will continue her current therapy.  2. Chronic anticoagulation, therapeutic. Continue Coumadin.  3. Hypertension, well controlled.

## 2013-08-04 NOTE — Patient Instructions (Signed)
Continue your current therapy  I will see you in 6 months.   

## 2013-08-05 DIAGNOSIS — I4891 Unspecified atrial fibrillation: Secondary | ICD-10-CM | POA: Diagnosis not present

## 2013-08-05 DIAGNOSIS — Z7901 Long term (current) use of anticoagulants: Secondary | ICD-10-CM | POA: Diagnosis not present

## 2013-09-07 DIAGNOSIS — I4891 Unspecified atrial fibrillation: Secondary | ICD-10-CM | POA: Diagnosis not present

## 2013-09-07 DIAGNOSIS — Z7901 Long term (current) use of anticoagulants: Secondary | ICD-10-CM | POA: Diagnosis not present

## 2013-09-22 DIAGNOSIS — Z6825 Body mass index (BMI) 25.0-25.9, adult: Secondary | ICD-10-CM | POA: Diagnosis not present

## 2013-09-22 DIAGNOSIS — S72009A Fracture of unspecified part of neck of unspecified femur, initial encounter for closed fracture: Secondary | ICD-10-CM | POA: Diagnosis not present

## 2013-09-22 DIAGNOSIS — I4891 Unspecified atrial fibrillation: Secondary | ICD-10-CM | POA: Diagnosis not present

## 2013-09-22 DIAGNOSIS — I10 Essential (primary) hypertension: Secondary | ICD-10-CM | POA: Diagnosis not present

## 2013-10-11 DIAGNOSIS — Z23 Encounter for immunization: Secondary | ICD-10-CM | POA: Diagnosis not present

## 2013-10-11 DIAGNOSIS — I480 Paroxysmal atrial fibrillation: Secondary | ICD-10-CM | POA: Diagnosis not present

## 2013-10-11 DIAGNOSIS — Z7901 Long term (current) use of anticoagulants: Secondary | ICD-10-CM | POA: Diagnosis not present

## 2013-11-08 DIAGNOSIS — Z7901 Long term (current) use of anticoagulants: Secondary | ICD-10-CM | POA: Diagnosis not present

## 2013-11-08 DIAGNOSIS — I480 Paroxysmal atrial fibrillation: Secondary | ICD-10-CM | POA: Diagnosis not present

## 2013-12-17 DIAGNOSIS — Z7901 Long term (current) use of anticoagulants: Secondary | ICD-10-CM | POA: Diagnosis not present

## 2013-12-17 DIAGNOSIS — I480 Paroxysmal atrial fibrillation: Secondary | ICD-10-CM | POA: Diagnosis not present

## 2014-01-14 DIAGNOSIS — I480 Paroxysmal atrial fibrillation: Secondary | ICD-10-CM | POA: Diagnosis not present

## 2014-01-14 DIAGNOSIS — Z7901 Long term (current) use of anticoagulants: Secondary | ICD-10-CM | POA: Diagnosis not present

## 2014-01-20 ENCOUNTER — Encounter (HOSPITAL_COMMUNITY): Payer: Self-pay | Admitting: Orthopedic Surgery

## 2014-01-25 ENCOUNTER — Encounter: Payer: Self-pay | Admitting: Cardiology

## 2014-01-25 ENCOUNTER — Ambulatory Visit (INDEPENDENT_AMBULATORY_CARE_PROVIDER_SITE_OTHER): Payer: Medicare Other | Admitting: Cardiology

## 2014-01-25 VITALS — BP 140/62 | HR 59 | Ht 65.0 in | Wt 146.6 lb

## 2014-01-25 DIAGNOSIS — E785 Hyperlipidemia, unspecified: Secondary | ICD-10-CM | POA: Diagnosis not present

## 2014-01-25 DIAGNOSIS — I10 Essential (primary) hypertension: Secondary | ICD-10-CM

## 2014-01-25 DIAGNOSIS — I480 Paroxysmal atrial fibrillation: Secondary | ICD-10-CM

## 2014-01-25 NOTE — Patient Instructions (Signed)
Continue your current therapy  I will see you in 6 months with an Ecg   

## 2014-01-25 NOTE — Progress Notes (Signed)
Gloria [name] Date of Birth: 08-Nov-1930   History of Present Illness: Gloria [name] seen today for followup paroxysmal afib. She has a history of atrial fibrillation controlled with flecainide. She states she is doing great. She denies any symptoms of palpitations. She denies any chest pain or SOB.  Myoview study in 6/14 was normal. She reports Coumadin has been therapeutic.  Current Outpatient Prescriptions on File Prior to Visit  Medication Sig Dispense Refill  . Calcium Carb-Cholecalciferol (CALCIUM 600 + D PO) Take 1 tablet by mouth 2 (two) times daily.    . flecainide (TAMBOCOR) 50 MG tablet Take 1 tablet (50 mg total) by mouth 2 (two) times daily. 180 tablet 1  . hydrochlorothiazide (HYDRODIURIL) 25 MG tablet Take 25 mg by mouth daily.    . Multiple Vitamins-Minerals (ICAPS MV PO) Take 2 capsules by mouth daily.     Marland Kitchen omeprazole (PRILOSEC) 20 MG capsule Take 20 mg by mouth daily.    . vitamin B-12 (CYANOCOBALAMIN) 1000 MCG tablet Take 1,000 mcg by mouth daily.    Marland Kitchen warfarin (COUMADIN) 5 MG tablet Take 1 tablet (5 mg total) by mouth daily. 30 tablet 1   No current facility-administered medications on file prior to visit.    Allergies  Allergen Reactions  . Cephalexin Other (See Comments)    unknown  . Codeine Other (See Comments)    unknown  . Metoclopramide Hcl Other (See Comments)    unknown    Past Medical History  Diagnosis Date  . Hypertension   . Hyperlipidemia   . GERD (gastroesophageal reflux disease)     hx of  . Arthritis     right  . Atrial fibrillation   . Irritable bowel syndrome   . Meningioma   . Hepatitis C     Past Surgical History  Procedure Laterality Date  . Total hip arthroplasty  11/07    left  . Parotid gland tumor excision    . Total knee arthroplasty    . Transthoracic echocardiogram  10/25/2009    EF 55-60%  . Cardiovascular stress test  11/14/2006    EF 67%  . Orif periprosthetic fracture Left 04/30/2013    Procedure: OPEN  REDUCTION INTERNAL FIXATION (ORIF) PERIPROSTHETIC FRACTURE;  Surgeon: [name], MD;  Location: [facility];  Service: Orthopedics;  Laterality: Left;    History  Smoking status  . Former Smoker -- 0.30 packs/day for 15 years  . Types: Cigarettes  . Quit date: 06/07/1980  Smokeless tobacco  . Never Used    History  Alcohol Use No    Family History  Problem Relation Age of Onset  . Heart disease Brother     Review of Systems: As noted in history of present illness.  All other systems were reviewed and are negative.  Physical Exam: BP 140/62 mmHg  Pulse 59  Ht 5\' 5"  (1.651 m)  Wt 146 lb 9.6 oz (66.497 kg)  BMI 24.40 kg/m2 She is a pleasant white female in no acute distress. Her HEENT exam is unremarkable. She has no JVD or bruits. Lungs are clear. Cardiac exam reveals a regular rate and rhythm with a soft 1/6 systolic ejection murmur at right upper sternal border. Abdomen is soft and nontender without masses. She has no edema. Pedal pulses are good.  LABORATORY DATA:  Assessment / Plan: 1. Atrial fibrillation. This is well controlled on flecainide. We will continue her current therapy. Follow up in 6 months with [facility].  2. Chronic anticoagulation,  therapeutic. Continue Coumadin.  3. Hypertension, well controlled.

## 2014-02-11 DIAGNOSIS — Z7901 Long term (current) use of anticoagulants: Secondary | ICD-10-CM | POA: Diagnosis not present

## 2014-02-11 DIAGNOSIS — I480 Paroxysmal atrial fibrillation: Secondary | ICD-10-CM | POA: Diagnosis not present

## 2014-03-11 DIAGNOSIS — I480 Paroxysmal atrial fibrillation: Secondary | ICD-10-CM | POA: Diagnosis not present

## 2014-03-11 DIAGNOSIS — Z7901 Long term (current) use of anticoagulants: Secondary | ICD-10-CM | POA: Diagnosis not present

## 2014-04-12 ENCOUNTER — Emergency Department (HOSPITAL_COMMUNITY)
Admission: EM | Admit: 2014-04-12 | Discharge: 2014-04-12 | Disposition: A | Discharge status: Home / Self Care | Payer: Medicare Other | Attending: Emergency Medicine | Admitting: Emergency Medicine

## 2014-04-12 ENCOUNTER — Emergency Department (HOSPITAL_COMMUNITY): Payer: Medicare Other

## 2014-04-12 ENCOUNTER — Encounter (HOSPITAL_COMMUNITY): Payer: Self-pay | Admitting: Radiology

## 2014-04-12 DIAGNOSIS — Z86018 Personal history of other benign neoplasm: Secondary | ICD-10-CM | POA: Diagnosis not present

## 2014-04-12 DIAGNOSIS — Z8619 Personal history of other infectious and parasitic diseases: Secondary | ICD-10-CM | POA: Insufficient documentation

## 2014-04-12 DIAGNOSIS — S42291A Other displaced fracture of upper end of right humerus, initial encounter for closed fracture: Secondary | ICD-10-CM | POA: Diagnosis not present

## 2014-04-12 DIAGNOSIS — Z79899 Other long term (current) drug therapy: Secondary | ICD-10-CM | POA: Insufficient documentation

## 2014-04-12 DIAGNOSIS — Z87891 Personal history of nicotine dependence: Secondary | ICD-10-CM | POA: Diagnosis not present

## 2014-04-12 DIAGNOSIS — S4991XA Unspecified injury of right shoulder and upper arm, initial encounter: Secondary | ICD-10-CM | POA: Diagnosis present

## 2014-04-12 DIAGNOSIS — Y998 Other external cause status: Secondary | ICD-10-CM | POA: Insufficient documentation

## 2014-04-12 DIAGNOSIS — W01198A Fall on same level from slipping, tripping and stumbling with subsequent striking against other object, initial encounter: Secondary | ICD-10-CM | POA: Diagnosis not present

## 2014-04-12 DIAGNOSIS — S42211A Unspecified displaced fracture of surgical neck of right humerus, initial encounter for closed fracture: Secondary | ICD-10-CM | POA: Diagnosis not present

## 2014-04-12 DIAGNOSIS — Z8739 Personal history of other diseases of the musculoskeletal system and connective tissue: Secondary | ICD-10-CM | POA: Diagnosis not present

## 2014-04-12 DIAGNOSIS — S3991XA Unspecified injury of abdomen, initial encounter: Secondary | ICD-10-CM | POA: Diagnosis not present

## 2014-04-12 DIAGNOSIS — Z8719 Personal history of other diseases of the digestive system: Secondary | ICD-10-CM | POA: Diagnosis not present

## 2014-04-12 DIAGNOSIS — I10 Essential (primary) hypertension: Secondary | ICD-10-CM | POA: Diagnosis not present

## 2014-04-12 DIAGNOSIS — S20211A Contusion of right front wall of thorax, initial encounter: Secondary | ICD-10-CM | POA: Diagnosis not present

## 2014-04-12 DIAGNOSIS — S4990XA Unspecified injury of shoulder and upper arm, unspecified arm, initial encounter: Secondary | ICD-10-CM | POA: Diagnosis not present

## 2014-04-12 DIAGNOSIS — R079 Chest pain, unspecified: Secondary | ICD-10-CM | POA: Diagnosis not present

## 2014-04-12 DIAGNOSIS — R030 Elevated blood-pressure reading, without diagnosis of hypertension: Secondary | ICD-10-CM | POA: Diagnosis not present

## 2014-04-12 DIAGNOSIS — Z8639 Personal history of other endocrine, nutritional and metabolic disease: Secondary | ICD-10-CM | POA: Diagnosis not present

## 2014-04-12 DIAGNOSIS — Z7901 Long term (current) use of anticoagulants: Secondary | ICD-10-CM | POA: Diagnosis not present

## 2014-04-12 DIAGNOSIS — R0789 Other chest pain: Secondary | ICD-10-CM | POA: Insufficient documentation

## 2014-04-12 DIAGNOSIS — Y9289 Other specified places as the place of occurrence of the external cause: Secondary | ICD-10-CM | POA: Insufficient documentation

## 2014-04-12 DIAGNOSIS — Y9389 Activity, other specified: Secondary | ICD-10-CM | POA: Insufficient documentation

## 2014-04-12 DIAGNOSIS — S42201A Unspecified fracture of upper end of right humerus, initial encounter for closed fracture: Secondary | ICD-10-CM | POA: Diagnosis not present

## 2014-04-12 DIAGNOSIS — K219 Gastro-esophageal reflux disease without esophagitis: Secondary | ICD-10-CM | POA: Insufficient documentation

## 2014-04-12 DIAGNOSIS — R1011 Right upper quadrant pain: Secondary | ICD-10-CM | POA: Diagnosis not present

## 2014-04-12 DIAGNOSIS — S42301A Unspecified fracture of shaft of humerus, right arm, initial encounter for closed fracture: Secondary | ICD-10-CM

## 2014-04-12 DIAGNOSIS — S299XXA Unspecified injury of thorax, initial encounter: Secondary | ICD-10-CM | POA: Diagnosis not present

## 2014-04-12 LAB — CBC WITH DIFFERENTIAL/PLATELET
Basophils Absolute: 0 10*3/uL (ref 0.0–0.1)
Basophils Relative: 0 % (ref 0–1)
Eosinophils Absolute: 0.1 10*3/uL (ref 0.0–0.7)
Eosinophils Relative: 1 % (ref 0–5)
HCT: 40.8 % (ref 36.0–46.0)
Hemoglobin: 13.6 g/dL (ref 12.0–15.0)
Lymphocytes Relative: 10 % — ABNORMAL LOW (ref 12–46)
Lymphs Abs: 1 10*3/uL (ref 0.7–4.0)
MCH: 29.2 pg (ref 26.0–34.0)
MCHC: 33.3 g/dL (ref 30.0–36.0)
MCV: 87.6 fL (ref 78.0–100.0)
Monocytes Absolute: 0.5 10*3/uL (ref 0.1–1.0)
Monocytes Relative: 5 % (ref 3–12)
Neutro Abs: 8 10*3/uL — ABNORMAL HIGH (ref 1.7–7.7)
Neutrophils Relative %: 84 % — ABNORMAL HIGH (ref 43–77)
Platelets: 209 10*3/uL (ref 150–400)
RBC: 4.66 MIL/uL (ref 3.87–5.11)
RDW: 14.4 % (ref 11.5–15.5)
WBC: 9.5 10*3/uL (ref 4.0–10.5)

## 2014-04-12 LAB — PROTIME-INR
INR: 2.04 — ABNORMAL HIGH (ref 0.00–1.49)
Prothrombin Time: 23.2 seconds — ABNORMAL HIGH (ref 11.6–15.2)

## 2014-04-12 LAB — BASIC METABOLIC PANEL
Anion gap: 8 (ref 5–15)
BUN: 15 mg/dL (ref 6–23)
CO2: 27 mmol/L (ref 19–32)
Calcium: 9.3 mg/dL (ref 8.4–10.5)
Chloride: 103 mmol/L (ref 96–112)
Creatinine, Ser: 0.86 mg/dL (ref 0.50–1.10)
GFR calc Af Amer: 71 mL/min — ABNORMAL LOW (ref >90)
GFR calc non Af Amer: 62 mL/min — ABNORMAL LOW (ref >90)
Glucose, Bld: 141 mg/dL — ABNORMAL HIGH (ref 70–99)
Potassium: 3.5 mmol/L (ref 3.5–5.1)
Sodium: 138 mmol/L (ref 135–145)

## 2014-04-12 MED ORDER — FENTANYL CITRATE 0.05 MG/ML IJ SOLN
50.0000 ug | Freq: Once | INTRAMUSCULAR | Status: AC
  Administered 2014-04-12: 50 ug via INTRAVENOUS
  Filled 2014-04-12: qty 2

## 2014-04-12 MED ORDER — OXYCODONE-ACETAMINOPHEN 5-325 MG PO TABS: 1.0000 | ORAL_TABLET | ORAL | Status: DC | PRN

## 2014-04-12 MED ORDER — MORPHINE SULFATE 4 MG/ML IJ SOLN
4.0000 mg | Freq: Once | INTRAMUSCULAR | Status: AC
  Administered 2014-04-12: 4 mg via INTRAVENOUS
  Filled 2014-04-12: qty 1

## 2014-04-12 MED ORDER — METHYLPREDNISOLONE SODIUM SUCC 125 MG IJ SOLR
80.0000 mg | Freq: Once | INTRAMUSCULAR | Status: AC
  Administered 2014-04-12: 80 mg via INTRAVENOUS
  Filled 2014-04-12: qty 2

## 2014-04-12 MED ORDER — HYDROMORPHONE HCL 1 MG/ML IJ SOLN
0.5000 mg | Freq: Once | INTRAMUSCULAR | Status: AC
  Administered 2014-04-12: 0.5 mg via INTRAVENOUS
  Filled 2014-04-12: qty 1

## 2014-04-12 MED ORDER — IOHEXOL 300 MG/ML  SOLN
100.0000 mL | Freq: Once | INTRAMUSCULAR | Status: AC | PRN
  Administered 2014-04-12: 100 mL via INTRAVENOUS

## 2014-04-12 MED ORDER — DIPHENHYDRAMINE HCL 50 MG/ML IJ SOLN
25.0000 mg | Freq: Once | INTRAMUSCULAR | Status: AC
  Administered 2014-04-12: 25 mg via INTRAVENOUS
  Filled 2014-04-12: qty 1

## 2014-04-12 MED ORDER — OXYCODONE-ACETAMINOPHEN 5-325 MG PO TABS
2.0000 | ORAL_TABLET | Freq: Once | ORAL | Status: AC
  Administered 2014-04-12: 2 via ORAL
  Filled 2014-04-12: qty 2

## 2014-04-12 MED ORDER — ACETAMINOPHEN 650 MG RE SUPP
975.0000 mg | Freq: Once | RECTAL | Status: AC
  Administered 2014-04-12: 975 mg via RECTAL
  Filled 2014-04-12: qty 2

## 2014-04-12 NOTE — ED Notes (Signed)
Pt requesting more pain medication prior to sling application. Dr. Johnney Killian aware.

## 2014-04-12 NOTE — ED Notes (Signed)
Will monitor patient to ensure no allergic reaction.

## 2014-04-12 NOTE — ED Provider Notes (Signed)
CSN: [id number]     Arrival date & time 04/12/14  0347 History   First MD Initiated Contact with Patient 04/12/14 0932     No chief complaint on file.    (Consider location/radiation/quality/duration/timing/severity/associated sxs/prior Treatment) HPI The patient reports that she had lost her balance and fallen this morning landing on a brick fireplace. She fell onto her right shoulder and has severe pain in the right shoulder. She states she also hit her right side and it hurts on the right side of her chest especially when she moves or takes a deep breath. She denies that she hit her head. She denies any headache or neck pain. She denies any lower extremity pain or pelvic pain. She reports she can move her lower extremities without difficulty. Any movement however to her right shoulder causes her excruciating pain. The patient poor she does take Coumadin. Past Medical History  Diagnosis Date  . Hypertension   . Hyperlipidemia   . GERD (gastroesophageal reflux disease)     hx of  . Arthritis     right  . Atrial fibrillation   . Irritable bowel syndrome   . Meningioma   . Hepatitis C    Past Surgical History  Procedure Laterality Date  . Total hip arthroplasty  11/07    left  . Parotid gland tumor excision    . Total knee arthroplasty    . Transthoracic echocardiogram  10/25/2009    EF 55-60%  . Cardiovascular stress test  11/14/2006    EF 67%  . Orif periprosthetic fracture Left 04/30/2013    Procedure: OPEN REDUCTION INTERNAL FIXATION (ORIF) PERIPROSTHETIC FRACTURE;  Surgeon: [name], MD;  Location: [place];  Service: Orthopedics;  Laterality: Left;   Family History  Problem Relation Age of Onset  . Heart disease Brother    History  Substance Use Topics  . Smoking status: Former Smoker -- 0.30 packs/day for 15 years    Types: Cigarettes    Quit date: 06/07/1980  . Smokeless tobacco: Never Used  . Alcohol Use: No   OB History    No data available     Review of  Systems  10 Systems reviewed and are negative for acute change except as noted in the HPI.   Allergies  Cephalexin; Codeine; and Metoclopramide hcl  Home Medications   Prior to Admission medications   Medication Sig Start Date End Date Taking? Authorizing Provider  Calcium Carb-Cholecalciferol (CALCIUM 600 + D PO) Take 1 tablet by mouth 2 (two) times daily.   Yes Historical Provider, MD  flecainide (TAMBOCOR) 50 MG tablet Take 1 tablet (50 mg total) by mouth 2 (two) times daily. 04/29/11  Yes Peter M Martinique, MD  hydrochlorothiazide (HYDRODIURIL) 25 MG tablet Take 25 mg by mouth daily.   Yes Historical Provider, MD  Multiple Vitamins-Minerals (ICAPS MV PO) Take 2 capsules by mouth daily.    Yes Historical Provider, MD  omeprazole (PRILOSEC) 20 MG capsule Take 20 mg by mouth 2 (two) times daily before a meal.    Yes Historical Provider, MD  Polyethyl Glycol-Propyl Glycol (SYSTANE ULTRA OP) Apply 1 drop to eye as needed (dry eyes).   Yes Historical Provider, MD  vitamin B-12 (CYANOCOBALAMIN) 1000 MCG tablet Take 1,000 mcg by mouth daily.   Yes Historical Provider, MD  warfarin (COUMADIN) 5 MG tablet Take 1 tablet (5 mg total) by mouth daily. Patient taking differently: Take 5 mg by mouth daily. Take 1 tablet all days except on Tues,  Thurs, and Sat take 1 1/2 (7.5mg ) 05/03/13  Yes Paralee Cancel, MD  oxyCODONE-acetaminophen (PERCOCET) 5-325 MG per tablet Take 1-2 tablets by mouth every 4 (four) hours as needed. 04/12/14   Charlesetta Shanks, MD   BP 139/82 mmHg  Pulse 64  Temp(Src) 98.1 F (36.7 C) (Oral)  Resp 20  Ht 5\' 5"  (1.651 m)  Wt 147 lb (66.679 kg)  BMI 24.46 kg/m2  SpO2 97% Physical Exam  Constitutional: She is oriented to person, place, and time. She appears well-developed and well-nourished.  The patient is expressing severe pain in her right shoulder. She is alert and oriented 3. No respiratory distress.  HENT:  Head: Normocephalic and atraumatic.  Right Ear: External ear normal.   Left Ear: External ear normal.  Nose: Nose normal.  Mouth/Throat: Oropharynx is clear and moist.  Eyes: EOM are normal. Pupils are equal, round, and reactive to light.  Neck: Neck supple.  No cervical spine tenderness to palpation  Cardiovascular: Normal rate, regular rhythm, normal heart sounds and intact distal pulses.   Pulmonary/Chest: Effort normal and breath sounds normal. No respiratory distress. She exhibits tenderness.  Abdominal: Soft. Bowel sounds are normal. She exhibits no distension. There is no tenderness.  Musculoskeletal: Normal range of motion. She exhibits no edema.  No tenderness to compression of the pelvic wings. Patient has good spontaneous motion of both lower extremity she will pick each one up at command and flex and extend fully at the hip and knee and ankle. She denies is causes any pain. The left upper extremity also has normal range of motion. The right shoulder and upper arm are exquisitely tender to palpation. Any attempt at range of motion causes the patient's severe pain. There is no deformity to the elbow forearm wrist or hand. Radial pulses 2+ and strong. The hand is warm and dry. She is able to perform grip strength with the right hand.  Neurological: She is alert and oriented to person, place, and time. She has normal strength. No cranial nerve deficit. She exhibits normal muscle tone. Coordination normal. GCS eye subscore is 4. GCS verbal subscore is 5. GCS motor subscore is 6.  Skin: Skin is warm, dry and intact.  Psychiatric: She has a normal mood and affect.    ED Course  Procedures (including critical care time) Labs Review Labs Reviewed  BASIC METABOLIC PANEL - Abnormal; Notable for the following:    Glucose, Bld 141 (*)    GFR calc non Af Amer 62 (*)    GFR calc Af Amer 71 (*)    All other components within normal limits  CBC WITH DIFFERENTIAL/PLATELET - Abnormal; Notable for the following:    Neutrophils Relative % 84 (*)    Neutro Abs 8.0  (*)    Lymphocytes Relative 10 (*)    All other components within normal limits  PROTIME-INR - Abnormal; Notable for the following:    Prothrombin Time 23.2 (*)    INR 2.04 (*)    All other components within normal limits    Imaging Review Dg Shoulder Right  04/12/2014   CLINICAL DATA:  Fall onto a fire place. Pain in right shoulder. Difficulty moving the right arm.  EXAM: RIGHT SHOULDER - 2+ VIEW  COMPARISON:  None.  FINDINGS: Surgical neck fracture of the right humerus observed. I suspect greater than 1 cm displacement. Also, there appears to be a greater tuberosity fragment which is displaced. This suggests a 3 part fracture.  Bony demineralization.  IMPRESSION: 1.  Surgical neck fracture of the right humerus, thought to be at least 3 part.   Electronically Signed   By: Van Clines M.D.   On: 04/12/2014 11:18   Ct Chest W Contrast  04/12/2014   CLINICAL DATA:  Right shoulder fracture. Right upper quadrant abdominal pain radiating to the back. Fall. Anti coagulation. Right chest pain.  EXAM: CT CHEST, ABDOMEN, AND PELVIS WITH CONTRAST  TECHNIQUE: Multidetector CT imaging of the chest, abdomen and pelvis was performed following the standard protocol during bolus administration of intravenous contrast.  CONTRAST:  186mL OMNIPAQUE IOHEXOL 300 MG/ML  SOLN  COMPARISON:  Multiple exams, including 05/19/2013  FINDINGS: CT CHEST FINDINGS  Mediastinum/Nodes: Atherosclerotic calcification of the aortic arch, branch vessels, and coronary arteries noted. Old granulomatous disease observed with calcified AP window and left hilar lymph nodes.  Right lower paratracheal lymph node 1.1 cm on image 24 series 2, previously the same.  Lungs/Pleura: Calcified left upper lobe granuloma, image 21 series 3, benign.  Linear subsegmental atelectasis or scarring in the posterior basal segment left lower lobe.  Musculoskeletal: Displaced surgical neck fracture of the right proximal humerus observed. Various fragments are  present. Mild grade 1 degenerative anterior subluxation of T2 on T3.  CT ABDOMEN PELVIS FINDINGS  Hepatobiliary: Scattered punctate calcifications in the liver and spleen compatible with old granulomatous disease, likely histoplasmosis.  Hypodense lesion in segment 5 of the liver, image 58 series 2, 3 mm in diameter. Similar lesion (although smaller) in segment 6. The former lesion is minimally larger than on 04/08/2011 but still probably benign; the lateral lesion is not well seen previously, not surprising given that it is in the 2 mm range.  Pancreas: Unremarkable  Spleen: Punctate calcifications from remote granulomatous disease.  Adrenals/Urinary Tract: Fluid density 1.9 cm cyst of the left mid to lower kidney, Bosniak category 1. Nonspecific hypodense lesion in the left mid kidney measuring 7 mm in diameter, too small to characterize, although probably a benign cyst. Distal ureters and parts the urinary bladder obscured by streak artifact from the bilateral hip implants.  Stomach/Bowel: Unremarkable  Vascular/Lymphatic: Aortoiliac atherosclerotic vascular disease.  Reproductive: Partially obscured by streak artifact ; uterus unremarkable where visualized.  Other: No supplemental non-categorized findings.  Musculoskeletal: A multilevel lumbar spondylosis and degenerative disc disease including a large disc bulge at L1-2. There is mild left foraminal impingement at L3-4 due to intervertebral and facet spurring, and mild right foraminal stenosis at L4-5 and L5-S1 due to intervertebral and facet spurring as well.  IMPRESSION: 1. Displaced surgical neck fracture the right proximal humerus. 2. No other acute traumatic findings. Ancillary findings include atherosclerosis ; a chronic mildly enlarged right lower paratracheal lymph node; old granulomatous disease; several tiny hypodense liver lesions which are highly likely to be benign; several benign-appearing renal cysts; and multilevel lumbar spondylosis and  degenerative disc disease causing mild foraminal impingement at several levels.   Electronically Signed   By: Van Clines M.D.   On: 04/12/2014 12:14   Ct Abdomen Pelvis W Contrast  04/12/2014   CLINICAL DATA:  Right shoulder fracture. Right upper quadrant abdominal pain radiating to the back. Fall. Anti coagulation. Right chest pain.  EXAM: CT CHEST, ABDOMEN, AND PELVIS WITH CONTRAST  TECHNIQUE: Multidetector CT imaging of the chest, abdomen and pelvis was performed following the standard protocol during bolus administration of intravenous contrast.  CONTRAST:  130mL OMNIPAQUE IOHEXOL 300 MG/ML  SOLN  COMPARISON:  Multiple exams, including 05/19/2013  FINDINGS: CT CHEST FINDINGS  Mediastinum/Nodes: Atherosclerotic calcification of the aortic arch, branch vessels, and coronary arteries noted. Old granulomatous disease observed with calcified AP window and left hilar lymph nodes.  Right lower paratracheal lymph node 1.1 cm on image 24 series 2, previously the same.  Lungs/Pleura: Calcified left upper lobe granuloma, image 21 series 3, benign.  Linear subsegmental atelectasis or scarring in the posterior basal segment left lower lobe.  Musculoskeletal: Displaced surgical neck fracture of the right proximal humerus observed. Various fragments are present. Mild grade 1 degenerative anterior subluxation of T2 on T3.  CT ABDOMEN PELVIS FINDINGS  Hepatobiliary: Scattered punctate calcifications in the liver and spleen compatible with old granulomatous disease, likely histoplasmosis.  Hypodense lesion in segment 5 of the liver, image 58 series 2, 3 mm in diameter. Similar lesion (although smaller) in segment 6. The former lesion is minimally larger than on 04/08/2011 but still probably benign; the lateral lesion is not well seen previously, not surprising given that it is in the 2 mm range.  Pancreas: Unremarkable  Spleen: Punctate calcifications from remote granulomatous disease.  Adrenals/Urinary Tract: Fluid  density 1.9 cm cyst of the left mid to lower kidney, Bosniak category 1. Nonspecific hypodense lesion in the left mid kidney measuring 7 mm in diameter, too small to characterize, although probably a benign cyst. Distal ureters and parts the urinary bladder obscured by streak artifact from the bilateral hip implants.  Stomach/Bowel: Unremarkable  Vascular/Lymphatic: Aortoiliac atherosclerotic vascular disease.  Reproductive: Partially obscured by streak artifact ; uterus unremarkable where visualized.  Other: No supplemental non-categorized findings.  Musculoskeletal: A multilevel lumbar spondylosis and degenerative disc disease including a large disc bulge at L1-2. There is mild left foraminal impingement at L3-4 due to intervertebral and facet spurring, and mild right foraminal stenosis at L4-5 and L5-S1 due to intervertebral and facet spurring as well.  IMPRESSION: 1. Displaced surgical neck fracture the right proximal humerus. 2. No other acute traumatic findings. Ancillary findings include atherosclerosis ; a chronic mildly enlarged right lower paratracheal lymph node; old granulomatous disease; several tiny hypodense liver lesions which are highly likely to be benign; several benign-appearing renal cysts; and multilevel lumbar spondylosis and degenerative disc disease causing mild foraminal impingement at several levels.   Electronically Signed   By: Van Clines M.D.   On: 04/12/2014 12:14   Dg Humerus Right  04/12/2014   CLINICAL DATA:  Lost her balance and fell while getting out of a chair this morning at home, fell into fireplace, RIGHT shoulder pain traveling down arm, initial encounter  EXAM: RIGHT HUMERUS - 2+ VIEW  COMPARISON:  11/25/2008  FINDINGS: Comminuted displaced proximal RIGHT humeral fracture.  No gross evidence of dislocation.  Elbow joint alignment suboptimally visualized.  Remainder of humerus appears intact.  AC joint alignment normal.  Diffuse osseous demineralization.   IMPRESSION: Comminuted displaced proximal RIGHT humeral fracture.  Osseous demineralization.   Electronically Signed   By: Lavonia Dana M.D.   On: 04/12/2014 11:18     EKG Interpretation None      Patient developed some mild hives on the forearm after demonstration of 0.5 of Dilaudid. She did not have any associated shortness of breath or other rash. She was given Benadryl and Solu-Medrol. The patient reports she has taken several types of narcotic medications both IV and orally without any reaction. She reports the codeine listed as a reaction is from a very long time ago and was a nausea and vomiting problem with confusion.  Consult: Patient's case was reviewed  with Dr. Percell Miller. At this time he suggests this will likely be nonoperative management and the patient can be discharged with sling and swath and outpatient follow-up. MDM   Final diagnoses:  Humerus fracture, right, closed, initial encounter  Chest wall contusion, right, initial encounter  Anticoagulated on Coumadin   Patient presents as outlined above with a mechanical fall this morning. Identified injury is a proximal humerus fracture she also had right sided chest wall pain. With the patient being anticoagulated on Coumadin and age I felt that a CT chest was indicated to rule out solid organ injury or pulmonary contusion. None is present at this time. The patient will be with family members for both assistance in ADLs as well as monitoring. She is discharged with plan as per discussion with orthopedics for outpatient follow-up regarding the humerus fracture and advised to see her family doctor for a recheck this week.  Charlesetta Shanks, MD 04/12/14 1537

## 2014-04-12 NOTE — Discharge Instructions (Signed)
Humerus Fracture, Treated with Immobilization The humerus is the large bone in your upper arm. You have a broken (fractured) humerus. These fractures are easily diagnosed with X-rays. TREATMENT  Simple fractures which will heal without disability are treated with simple immobilization. Immobilization means you will wear a cast, splint, or sling. You have a fracture which will do well with immobilization. The fracture will heal well simply by being held in a good position until it is stable enough to begin range of motion exercises. Do not take part in activities which would further injure your arm.  HOME CARE INSTRUCTIONS   Put ice on the injured area.  Put ice in a plastic bag.  Place a towel between your skin and the bag.  Leave the ice on for 15-20 minutes, 03-04 times a day.  If you have a cast:  Do not scratch the skin under the cast using sharp or pointed objects.  Check the skin around the cast every day. You may put lotion on any red or sore areas.  Keep your cast dry and clean.  If you have a splint:  Wear the splint as directed.  Keep your splint dry and clean.  You may loosen the elastic around the splint if your fingers become numb, tingle, or turn cold or blue.  If you have a sling:  Wear the sling as directed.  Do not put pressure on any part of your cast or splint until it is fully hardened.  Your cast or splint can be protected during bathing with a plastic bag. Do not lower the cast or splint into water.  Only take over-the-counter or prescription medicines for pain, discomfort, or fever as directed by your caregiver.  Do range of motion exercises as instructed by your caregiver.  Follow up as directed by your caregiver. This is very important in order to avoid permanent injury or disability and chronic pain. SEEK IMMEDIATE MEDICAL CARE IF:   Your skin or nails in the injured arm turn blue or gray.  Your arm feels cold or numb.  You develop severe  pain in the injured arm.  You are having problems with the medicines you were given. MAKE SURE YOU:   Understand these instructions.  Will watch your condition.  Will get help right away if you are not doing well or get worse. Document Released: 04/01/2000 Document Revised: 03/18/2011 Document Reviewed: 02/07/2010 [facility] Patient Information 2015 [place], [place]. This information is not intended to replace advice given to you by your health care provider. Make sure you discuss any questions you have with your health care provider. Chest Wall Pain Chest wall pain is pain in or around the bones and muscles of your chest. It may take up to 6 weeks to get better. It may take longer if you must stay physically active in your work and activities.  CAUSES  Chest wall pain may happen on its own. However, it may be caused by:  A viral illness like the flu.  Injury.  Coughing.  Exercise.  Arthritis.  Fibromyalgia.  Shingles. HOME CARE INSTRUCTIONS   Avoid overtiring physical activity. Try not to strain or perform activities that cause pain. This includes any activities using your chest or your abdominal and side muscles, especially if heavy weights are used.  Put ice on the sore area.  Put ice in a plastic bag.  Place a towel between your skin and the bag.  Leave the ice on for 15-20 minutes per hour while awake  for the first 2 days.  Only take over-the-counter or prescription medicines for pain, discomfort, or fever as directed by your caregiver. SEEK IMMEDIATE MEDICAL CARE IF:   Your pain increases, or you are very uncomfortable.  You have a fever.  Your chest pain becomes worse.  You have new, unexplained symptoms.  You have nausea or vomiting.  You feel sweaty or lightheaded.  You have a cough with phlegm (sputum), or you cough up blood. MAKE SURE YOU:   Understand these instructions.  Will watch your condition.  Will get help right away if you are not doing  well or get worse. Document Released: 12/24/2004 Document Revised: 03/18/2011 Document Reviewed: 08/20/2010 [facility] Patient Information 2015 [place], [place]. This information is not intended to replace advice given to you by your health care provider. Make sure you discuss any questions you have with your health care provider.

## 2014-04-12 NOTE — ED Notes (Signed)
No signs of allergic reaction on discharge

## 2014-04-12 NOTE — ED Notes (Signed)
Pt presents with right shoulder place. Pt fell into a brick fireplace from standing. No LOC no other injuries. Pt has swelling and guaurding. Pulses intact

## 2014-04-14 ENCOUNTER — Telehealth: Payer: Self-pay

## 2014-04-14 DIAGNOSIS — S42231A 3-part fracture of surgical neck of right humerus, initial encounter for closed fracture: Secondary | ICD-10-CM | POA: Diagnosis not present

## 2014-04-14 NOTE — Telephone Encounter (Signed)
Received a call from Merla Riches at [facility].Patient fractured shoulder will need surgery Monday 04/18/14.Leroy Sea needs to know if ok to hold Coumadin and does she need Lovenox bridging.Brad's cell # [phone].Message sent to [place] for advice.

## 2014-04-14 NOTE — Telephone Encounter (Signed)
Returned call to [place] advised ok to hold coumadin 5 days before surgery.She does not need bridging.

## 2014-04-14 NOTE — Telephone Encounter (Signed)
She may hold coumadin for 5 days for surgery and does not need bridging.  Laysha Childers Martinique MD, [facility]

## 2014-04-15 ENCOUNTER — Encounter (HOSPITAL_COMMUNITY): Payer: Self-pay | Admitting: *Deleted

## 2014-04-15 NOTE — Progress Notes (Signed)
Pt denies SOB and chest pain. Pt is under the care of Dr. Martinique, cardiology. Pt stated that her last dose of Coumadin was 04/14/14. Pt made aware to stop otc vitamins and herbal medications. Pt verbalized understanding of all pre-op instructions.

## 2014-04-18 ENCOUNTER — Encounter (HOSPITAL_COMMUNITY)
Admission: RE | Disposition: A | Discharge status: Skilled Nursing Facility | Payer: Self-pay | Source: Ambulatory Visit | Attending: Orthopedic Surgery

## 2014-04-18 ENCOUNTER — Encounter (HOSPITAL_COMMUNITY): Payer: Self-pay

## 2014-04-18 ENCOUNTER — Ambulatory Visit (HOSPITAL_COMMUNITY): Payer: Medicare Other | Admitting: Certified Registered"

## 2014-04-18 ENCOUNTER — Inpatient Hospital Stay (HOSPITAL_COMMUNITY)
Admission: RE | Admit: 2014-04-18 | Discharge: 2014-04-21 | DRG: 494 | Disposition: A | Discharge status: Skilled Nursing Facility | Payer: Medicare Other | Source: Ambulatory Visit | Attending: Orthopedic Surgery | Admitting: Orthopedic Surgery

## 2014-04-18 DIAGNOSIS — I10 Essential (primary) hypertension: Secondary | ICD-10-CM | POA: Diagnosis present

## 2014-04-18 DIAGNOSIS — Z96642 Presence of left artificial hip joint: Secondary | ICD-10-CM | POA: Diagnosis present

## 2014-04-18 DIAGNOSIS — W1830XA Fall on same level, unspecified, initial encounter: Secondary | ICD-10-CM | POA: Diagnosis present

## 2014-04-18 DIAGNOSIS — S42201D Unspecified fracture of upper end of right humerus, subsequent encounter for fracture with routine healing: Secondary | ICD-10-CM | POA: Diagnosis not present

## 2014-04-18 DIAGNOSIS — S42301A Unspecified fracture of shaft of humerus, right arm, initial encounter for closed fracture: Secondary | ICD-10-CM | POA: Diagnosis not present

## 2014-04-18 DIAGNOSIS — M25511 Pain in right shoulder: Secondary | ICD-10-CM | POA: Diagnosis not present

## 2014-04-18 DIAGNOSIS — S42211A Unspecified displaced fracture of surgical neck of right humerus, initial encounter for closed fracture: Principal | ICD-10-CM | POA: Diagnosis present

## 2014-04-18 DIAGNOSIS — Z87891 Personal history of nicotine dependence: Secondary | ICD-10-CM

## 2014-04-18 DIAGNOSIS — W19XXXA Unspecified fall, initial encounter: Secondary | ICD-10-CM | POA: Diagnosis not present

## 2014-04-18 DIAGNOSIS — M1990 Unspecified osteoarthritis, unspecified site: Secondary | ICD-10-CM | POA: Diagnosis present

## 2014-04-18 DIAGNOSIS — Z79899 Other long term (current) drug therapy: Secondary | ICD-10-CM

## 2014-04-18 DIAGNOSIS — Z7901 Long term (current) use of anticoagulants: Secondary | ICD-10-CM | POA: Diagnosis not present

## 2014-04-18 DIAGNOSIS — I4891 Unspecified atrial fibrillation: Secondary | ICD-10-CM | POA: Diagnosis present

## 2014-04-18 DIAGNOSIS — S4290XA Fracture of unspecified shoulder girdle, part unspecified, initial encounter for closed fracture: Secondary | ICD-10-CM | POA: Diagnosis present

## 2014-04-18 DIAGNOSIS — E785 Hyperlipidemia, unspecified: Secondary | ICD-10-CM | POA: Diagnosis present

## 2014-04-18 DIAGNOSIS — K219 Gastro-esophageal reflux disease without esophagitis: Secondary | ICD-10-CM | POA: Diagnosis present

## 2014-04-18 DIAGNOSIS — K589 Irritable bowel syndrome without diarrhea: Secondary | ICD-10-CM | POA: Diagnosis present

## 2014-04-18 DIAGNOSIS — S42201B Unspecified fracture of upper end of right humerus, initial encounter for open fracture: Secondary | ICD-10-CM | POA: Diagnosis not present

## 2014-04-18 DIAGNOSIS — S42231A 3-part fracture of surgical neck of right humerus, initial encounter for closed fracture: Secondary | ICD-10-CM | POA: Diagnosis not present

## 2014-04-18 DIAGNOSIS — S4291XD Fracture of right shoulder girdle, part unspecified, subsequent encounter for fracture with routine healing: Secondary | ICD-10-CM | POA: Diagnosis not present

## 2014-04-18 DIAGNOSIS — B1920 Unspecified viral hepatitis C without hepatic coma: Secondary | ICD-10-CM | POA: Diagnosis present

## 2014-04-18 DIAGNOSIS — D329 Benign neoplasm of meninges, unspecified: Secondary | ICD-10-CM | POA: Diagnosis not present

## 2014-04-18 HISTORY — DX: Other specified postprocedural states: R11.2

## 2014-04-18 HISTORY — DX: Unspecified fracture of shaft of humerus, unspecified arm, initial encounter for closed fracture: S42.309A

## 2014-04-18 HISTORY — PX: ORIF HUMERUS FRACTURE: SHX2126

## 2014-04-18 HISTORY — DX: Other specified postprocedural states: Z98.890

## 2014-04-18 LAB — PROTIME-INR
INR: 1.21 (ref 0.00–1.49)
Prothrombin Time: 15.4 seconds — ABNORMAL HIGH (ref 11.6–15.2)

## 2014-04-18 SURGERY — OPEN REDUCTION INTERNAL FIXATION (ORIF) PROXIMAL HUMERUS FRACTURE
Anesthesia: General | Site: Shoulder | Laterality: Right

## 2014-04-18 MED ORDER — SODIUM CHLORIDE 0.9 % IV SOLN: INTRAVENOUS | Status: DC

## 2014-04-18 MED ORDER — DEXAMETHASONE SODIUM PHOSPHATE 10 MG/ML IJ SOLN
INTRAMUSCULAR | Status: DC | PRN
  Administered 2014-04-18: 8 mg via INTRAVENOUS

## 2014-04-18 MED ORDER — CALCIUM CARB-CHOLECALCIFEROL 600-200 MG-UNIT PO TABS: 1.0000 | ORAL_TABLET | Freq: Two times a day (BID) | ORAL | Status: DC

## 2014-04-18 MED ORDER — FENTANYL CITRATE 0.05 MG/ML IJ SOLN
INTRAMUSCULAR | Status: AC
  Administered 2014-04-18: 25 ug via INTRAVENOUS
  Filled 2014-04-18: qty 2

## 2014-04-18 MED ORDER — CLINDAMYCIN PHOSPHATE 600 MG/50ML IV SOLN
600.0000 mg | Freq: Four times a day (QID) | INTRAVENOUS | Status: AC
  Administered 2014-04-18 – 2014-04-19 (×3): 600 mg via INTRAVENOUS
  Filled 2014-04-18 (×3): qty 50

## 2014-04-18 MED ORDER — LACTATED RINGERS IV SOLN
INTRAVENOUS | Status: DC
  Administered 2014-04-18: 11:00:00 via INTRAVENOUS

## 2014-04-18 MED ORDER — CLINDAMYCIN PHOSPHATE 900 MG/50ML IV SOLN
900.0000 mg | INTRAVENOUS | Status: AC
  Administered 2014-04-18: 900 mg via INTRAVENOUS
  Filled 2014-04-18: qty 50

## 2014-04-18 MED ORDER — PANTOPRAZOLE SODIUM 40 MG PO TBEC
80.0000 mg | DELAYED_RELEASE_TABLET | Freq: Every day | ORAL | Status: DC
  Administered 2014-04-19 – 2014-04-21 (×3): 80 mg via ORAL
  Filled 2014-04-18 (×3): qty 2

## 2014-04-18 MED ORDER — GLYCOPYRROLATE 0.2 MG/ML IJ SOLN
INTRAMUSCULAR | Status: DC | PRN
  Administered 2014-04-18: 0.6 mg via INTRAVENOUS
  Administered 2014-04-18: 0.2 mg via INTRAVENOUS

## 2014-04-18 MED ORDER — OCUVITE-LUTEIN PO CAPS
1.0000 | ORAL_CAPSULE | Freq: Two times a day (BID) | ORAL | Status: DC
  Administered 2014-04-18: 1 via ORAL
  Filled 2014-04-18 (×4): qty 1

## 2014-04-18 MED ORDER — OXYCODONE-ACETAMINOPHEN 5-325 MG PO TABS
1.0000 | ORAL_TABLET | ORAL | Status: DC | PRN
  Administered 2014-04-18 – 2014-04-19 (×4): 1 via ORAL
  Administered 2014-04-19: 2 via ORAL
  Administered 2014-04-19: 1 via ORAL
  Administered 2014-04-19 – 2014-04-20 (×2): 2 via ORAL
  Administered 2014-04-20: 1 via ORAL
  Administered 2014-04-20: 2 via ORAL
  Administered 2014-04-20: 1 via ORAL
  Administered 2014-04-20 – 2014-04-21 (×3): 2 via ORAL
  Filled 2014-04-18 (×2): qty 1
  Filled 2014-04-18: qty 2
  Filled 2014-04-18 (×2): qty 1
  Filled 2014-04-18 (×4): qty 2
  Filled 2014-04-18 (×2): qty 1
  Filled 2014-04-18: qty 2
  Filled 2014-04-18: qty 1
  Filled 2014-04-18: qty 2

## 2014-04-18 MED ORDER — BISACODYL 10 MG RE SUPP: 10.0000 mg | Freq: Every day | RECTAL | Status: DC | PRN

## 2014-04-18 MED ORDER — ONDANSETRON HCL 4 MG/2ML IJ SOLN
INTRAMUSCULAR | Status: DC | PRN
  Administered 2014-04-18: 4 mg via INTRAVENOUS

## 2014-04-18 MED ORDER — 0.9 % SODIUM CHLORIDE (POUR BTL) OPTIME
TOPICAL | Status: DC | PRN
  Administered 2014-04-18: 1000 mL

## 2014-04-18 MED ORDER — ACETAMINOPHEN 650 MG RE SUPP: 650.0000 mg | Freq: Four times a day (QID) | RECTAL | Status: DC | PRN

## 2014-04-18 MED ORDER — CHLORHEXIDINE GLUCONATE 4 % EX LIQD: 60.0000 mL | Freq: Once | CUTANEOUS | Status: DC

## 2014-04-18 MED ORDER — SUCCINYLCHOLINE CHLORIDE 20 MG/ML IJ SOLN
INTRAMUSCULAR | Status: DC | PRN
  Administered 2014-04-18: 100 mg via INTRAVENOUS

## 2014-04-18 MED ORDER — WARFARIN SODIUM 5 MG PO TABS: 5.0000 mg | ORAL_TABLET | Freq: Every day | ORAL | Status: DC

## 2014-04-18 MED ORDER — SUCCINYLCHOLINE CHLORIDE 20 MG/ML IJ SOLN
INTRAMUSCULAR | Status: AC
  Filled 2014-04-18: qty 1

## 2014-04-18 MED ORDER — ACETAMINOPHEN 325 MG PO TABS: 650.0000 mg | ORAL_TABLET | Freq: Four times a day (QID) | ORAL | Status: DC | PRN

## 2014-04-18 MED ORDER — HYDROCHLOROTHIAZIDE 25 MG PO TABS
25.0000 mg | ORAL_TABLET | Freq: Every day | ORAL | Status: DC
  Administered 2014-04-18 – 2014-04-21 (×4): 25 mg via ORAL
  Filled 2014-04-18 (×4): qty 1

## 2014-04-18 MED ORDER — PHENYLEPHRINE HCL 10 MG/ML IJ SOLN
10.0000 mg | INTRAVENOUS | Status: DC | PRN
  Administered 2014-04-18: 10 ug/min via INTRAVENOUS

## 2014-04-18 MED ORDER — ROCURONIUM BROMIDE 50 MG/5ML IV SOLN
INTRAVENOUS | Status: AC
  Filled 2014-04-18: qty 1

## 2014-04-18 MED ORDER — ROCURONIUM BROMIDE 100 MG/10ML IV SOLN
INTRAVENOUS | Status: DC | PRN
  Administered 2014-04-18: 20 mg via INTRAVENOUS

## 2014-04-18 MED ORDER — CALCIUM CARBONATE-VITAMIN D 500-200 MG-UNIT PO TABS
1.0000 | ORAL_TABLET | Freq: Two times a day (BID) | ORAL | Status: DC
  Administered 2014-04-18 – 2014-04-21 (×6): 1 via ORAL
  Filled 2014-04-18 (×6): qty 1

## 2014-04-18 MED ORDER — ICAPS MV PO TABS: 1.0000 | ORAL_TABLET | Freq: Two times a day (BID) | ORAL | Status: DC

## 2014-04-18 MED ORDER — FLECAINIDE ACETATE 50 MG PO TABS
50.0000 mg | ORAL_TABLET | Freq: Two times a day (BID) | ORAL | Status: DC
  Administered 2014-04-18 – 2014-04-21 (×6): 50 mg via ORAL
  Filled 2014-04-18 (×9): qty 1

## 2014-04-18 MED ORDER — BUPIVACAINE-EPINEPHRINE (PF) 0.25% -1:200000 IJ SOLN
INTRAMUSCULAR | Status: DC | PRN
  Administered 2014-04-18: 3 mL

## 2014-04-18 MED ORDER — WARFARIN SODIUM 7.5 MG PO TABS
7.5000 mg | ORAL_TABLET | ORAL | Status: AC
  Administered 2014-04-18: 7.5 mg via ORAL
  Filled 2014-04-18: qty 1

## 2014-04-18 MED ORDER — LIDOCAINE HCL (CARDIAC) 20 MG/ML IV SOLN
INTRAVENOUS | Status: DC | PRN
  Administered 2014-04-18: 50 mg via INTRAVENOUS

## 2014-04-18 MED ORDER — MORPHINE SULFATE 2 MG/ML IJ SOLN
1.0000 mg | INTRAMUSCULAR | Status: DC | PRN
  Administered 2014-04-18 (×2): 2 mg via INTRAVENOUS
  Administered 2014-04-20: 1 mg via INTRAVENOUS
  Filled 2014-04-18 (×3): qty 1

## 2014-04-18 MED ORDER — LACTATED RINGERS IV SOLN
INTRAVENOUS | Status: DC | PRN
  Administered 2014-04-18 (×2): via INTRAVENOUS

## 2014-04-18 MED ORDER — FENTANYL CITRATE 0.05 MG/ML IJ SOLN
INTRAMUSCULAR | Status: AC
  Filled 2014-04-18: qty 2

## 2014-04-18 MED ORDER — POLYETHYL GLYCOL-PROPYL GLYCOL 0.4-0.3 % OP SOLN: 1.0000 [drp] | OPHTHALMIC | Status: DC | PRN

## 2014-04-18 MED ORDER — OXYCODONE-ACETAMINOPHEN 5-325 MG PO TABS: 1.0000 | ORAL_TABLET | ORAL | Status: DC | PRN

## 2014-04-18 MED ORDER — MENTHOL 3 MG MT LOZG: 1.0000 | LOZENGE | OROMUCOSAL | Status: DC | PRN

## 2014-04-18 MED ORDER — PROPOFOL 10 MG/ML IV BOLUS
INTRAVENOUS | Status: AC
  Filled 2014-04-18: qty 20

## 2014-04-18 MED ORDER — METOCLOPRAMIDE HCL 5 MG PO TABS: 5.0000 mg | ORAL_TABLET | Freq: Three times a day (TID) | ORAL | Status: DC | PRN

## 2014-04-18 MED ORDER — VITAMIN B-12 1000 MCG PO TABS
1000.0000 ug | ORAL_TABLET | Freq: Every day | ORAL | Status: DC
  Administered 2014-04-19 – 2014-04-21 (×3): 1000 ug via ORAL
  Filled 2014-04-18 (×3): qty 1

## 2014-04-18 MED ORDER — METOCLOPRAMIDE HCL 5 MG/ML IJ SOLN: 5.0000 mg | Freq: Three times a day (TID) | INTRAMUSCULAR | Status: DC | PRN

## 2014-04-18 MED ORDER — OXYCODONE-ACETAMINOPHEN 5-325 MG PO TABS
ORAL_TABLET | ORAL | Status: AC
  Administered 2014-04-18: 1 via ORAL
  Filled 2014-04-18: qty 1

## 2014-04-18 MED ORDER — PHENYLEPHRINE 40 MCG/ML (10ML) SYRINGE FOR IV PUSH (FOR BLOOD PRESSURE SUPPORT)
PREFILLED_SYRINGE | INTRAVENOUS | Status: AC
  Filled 2014-04-18: qty 10

## 2014-04-18 MED ORDER — ONDANSETRON HCL 4 MG/2ML IJ SOLN: 4.0000 mg | Freq: Four times a day (QID) | INTRAMUSCULAR | Status: DC | PRN

## 2014-04-18 MED ORDER — MIDAZOLAM HCL 2 MG/2ML IJ SOLN
INTRAMUSCULAR | Status: AC
  Filled 2014-04-18: qty 2

## 2014-04-18 MED ORDER — FENTANYL CITRATE 0.05 MG/ML IJ SOLN
25.0000 ug | INTRAMUSCULAR | Status: AC | PRN
  Administered 2014-04-18 (×6): 25 ug via INTRAVENOUS

## 2014-04-18 MED ORDER — NEOSTIGMINE METHYLSULFATE 10 MG/10ML IV SOLN
INTRAVENOUS | Status: DC | PRN
  Administered 2014-04-18: 4 mg via INTRAVENOUS

## 2014-04-18 MED ORDER — LIDOCAINE HCL (CARDIAC) 20 MG/ML IV SOLN
INTRAVENOUS | Status: AC
  Filled 2014-04-18: qty 5

## 2014-04-18 MED ORDER — BUPIVACAINE-EPINEPHRINE (PF) 0.25% -1:200000 IJ SOLN
INTRAMUSCULAR | Status: AC
  Filled 2014-04-18: qty 30

## 2014-04-18 MED ORDER — POLYETHYLENE GLYCOL 3350 17 G PO PACK: 17.0000 g | PACK | Freq: Every day | ORAL | Status: DC | PRN

## 2014-04-18 MED ORDER — FENTANYL CITRATE 0.05 MG/ML IJ SOLN
INTRAMUSCULAR | Status: DC | PRN
  Administered 2014-04-18 (×4): 50 ug via INTRAVENOUS

## 2014-04-18 MED ORDER — EPHEDRINE SULFATE 50 MG/ML IJ SOLN
INTRAMUSCULAR | Status: DC | PRN
  Administered 2014-04-18 (×3): 10 mg via INTRAVENOUS

## 2014-04-18 MED ORDER — WARFARIN - PHARMACIST DOSING INPATIENT: Freq: Every day | Status: DC

## 2014-04-18 MED ORDER — FENTANYL CITRATE 0.05 MG/ML IJ SOLN
INTRAMUSCULAR | Status: AC
  Filled 2014-04-18: qty 5

## 2014-04-18 MED ORDER — POLYVINYL ALCOHOL 1.4 % OP SOLN
1.0000 [drp] | OPHTHALMIC | Status: DC | PRN
  Filled 2014-04-18: qty 15

## 2014-04-18 MED ORDER — ONDANSETRON HCL 4 MG/2ML IJ SOLN
INTRAMUSCULAR | Status: AC
  Filled 2014-04-18: qty 2

## 2014-04-18 MED ORDER — MORPHINE SULFATE 2 MG/ML IJ SOLN
INTRAMUSCULAR | Status: AC
  Administered 2014-04-18: 2 mg via INTRAVENOUS
  Filled 2014-04-18: qty 1

## 2014-04-18 MED ORDER — PHENOL 1.4 % MT LIQD: 1.0000 | OROMUCOSAL | Status: DC | PRN

## 2014-04-18 MED ORDER — PROPOFOL 10 MG/ML IV BOLUS
INTRAVENOUS | Status: DC | PRN
  Administered 2014-04-18: 150 mg via INTRAVENOUS

## 2014-04-18 MED ORDER — DEXAMETHASONE SODIUM PHOSPHATE 4 MG/ML IJ SOLN
INTRAMUSCULAR | Status: AC
  Filled 2014-04-18: qty 2

## 2014-04-18 MED ORDER — ONDANSETRON HCL 4 MG PO TABS: 4.0000 mg | ORAL_TABLET | Freq: Four times a day (QID) | ORAL | Status: DC | PRN

## 2014-04-18 SURGICAL SUPPLY — 67 items
BIT DRILL 4 LONG FAST STEP (BIT) ×1 IMPLANT
BIT DRILL 4 SHORT FAST STEP (BIT) ×1 IMPLANT
BIT DRILL SNP 4.0MM (BIT) IMPLANT
BNDG COHESIVE 3X5 TAN STRL LF (GAUZE/BANDAGES/DRESSINGS) ×1 IMPLANT
COVER SURGICAL LIGHT HANDLE (MISCELLANEOUS) ×2 IMPLANT
DRAPE IMP U-DRAPE 54X76 (DRAPES) ×2 IMPLANT
DRAPE INCISE IOBAN 66X45 STRL (DRAPES) ×3 IMPLANT
DRAPE OEC MINIVIEW 54X84 (DRAPES) ×1 IMPLANT
DRAPE U-SHAPE 47X51 STRL (DRAPES) ×2 IMPLANT
DRILL BIT SNP 4.0MM (BIT) ×1
DRSG EMULSION OIL 3X3 NADH (GAUZE/BANDAGES/DRESSINGS) ×2 IMPLANT
DURAPREP 26ML APPLICATOR (WOUND CARE) ×2 IMPLANT
ELECT NDL TIP 2.8 STRL (NEEDLE) IMPLANT
ELECT NEEDLE TIP 2.8 STRL (NEEDLE) ×2 IMPLANT
ELECT REM PT RETURN 9FT ADLT (ELECTROSURGICAL) ×2
ELECTRODE REM PT RTRN 9FT ADLT (ELECTROSURGICAL) ×1 IMPLANT
GAUZE SPONGE 4X4 12PLY STRL (GAUZE/BANDAGES/DRESSINGS) ×2 IMPLANT
GLOVE BIOGEL PI IND STRL 7.0 (GLOVE) IMPLANT
GLOVE BIOGEL PI INDICATOR 7.0 (GLOVE) ×2
GLOVE BIOGEL PI ORTHO PRO 7.5 (GLOVE) ×1
GLOVE BIOGEL PI ORTHO PRO SZ8 (GLOVE) ×2
GLOVE ORTHO TXT STRL SZ7.5 (GLOVE) ×2 IMPLANT
GLOVE PI ORTHO PRO STRL 7.5 (GLOVE) ×1 IMPLANT
GLOVE PI ORTHO PRO STRL SZ8 (GLOVE) ×1 IMPLANT
GLOVE SURG ORTHO 8.5 STRL (GLOVE) ×3 IMPLANT
GOWN STRL REUS W/ TWL LRG LVL3 (GOWN DISPOSABLE) IMPLANT
GOWN STRL REUS W/ TWL XL LVL3 (GOWN DISPOSABLE) ×2 IMPLANT
GOWN STRL REUS W/TWL LRG LVL3 (GOWN DISPOSABLE) ×2
GOWN STRL REUS W/TWL XL LVL3 (GOWN DISPOSABLE) ×6
KIT BASIN OR (CUSTOM PROCEDURE TRAY) ×2 IMPLANT
KIT ROOM TURNOVER OR (KITS) ×2 IMPLANT
MANIFOLD NEPTUNE II (INSTRUMENTS) ×1 IMPLANT
NDL HYPO 25GX1X1/2 BEV (NEEDLE) IMPLANT
NEEDLE HYPO 25GX1X1/2 BEV (NEEDLE) ×2 IMPLANT
NS IRRIG 1000ML POUR BTL (IV SOLUTION) ×2 IMPLANT
PACK SHOULDER (CUSTOM PROCEDURE TRAY) ×2 IMPLANT
PACK UNIVERSAL I (CUSTOM PROCEDURE TRAY) ×2 IMPLANT
PAD ABD 8X10 STRL (GAUZE/BANDAGES/DRESSINGS) ×2 IMPLANT
PAD ARMBOARD 7.5X6 YLW CONV (MISCELLANEOUS) ×4 IMPLANT
PEG STND 4.0X30MM (Orthopedic Implant) ×2 IMPLANT
PEG STND 4.0X35MM (Orthopedic Implant) ×4 IMPLANT
PEG STND 4.0X40MM (Orthopedic Implant) ×4 IMPLANT
PEG STND 4.0X47.5MM (Orthopedic Implant) ×2 IMPLANT
PEGSTD 4.0X30MM (Orthopedic Implant) IMPLANT
PEGSTD 4.0X35MM (Orthopedic Implant) IMPLANT
PEGSTD 4.0X40MM (Orthopedic Implant) IMPLANT
PEGSTD 4.0X47.5MM (Orthopedic Implant) IMPLANT
PIN GUIDE SHOULDER 2.0MM (PIN) ×1 IMPLANT
PLATE SZ3 SHOULDER NAIL SNP (Plate) ×1 IMPLANT
RICHARD-ALLAN MAYO 1/2 TAPER ×1 IMPLANT
SCREW SNP UNICORTICAL (Screw) ×3 IMPLANT
SLING ARM IMMOBILIZER LRG (SOFTGOODS) ×1 IMPLANT
SPONGE LAP 4X18 X RAY DECT (DISPOSABLE) ×3 IMPLANT
STRIP CLOSURE SKIN 1/2X4 (GAUZE/BANDAGES/DRESSINGS) ×2 IMPLANT
SUCTION FRAZIER TIP 10 FR DISP (SUCTIONS) ×2 IMPLANT
SUT FIBERWIRE #2 38 T-5 BLUE (SUTURE) ×2
SUT MNCRL AB 4-0 PS2 18 (SUTURE) ×2 IMPLANT
SUT VIC AB 0 CT1 27 (SUTURE) ×2
SUT VIC AB 0 CT1 27XBRD ANBCTR (SUTURE) ×1 IMPLANT
SUT VIC AB 2-0 CT1 27 (SUTURE) ×2
SUT VIC AB 2-0 CT1 TAPERPNT 27 (SUTURE) ×2 IMPLANT
SUTURE FIBERWR #2 38 T-5 BLUE (SUTURE) IMPLANT
SYR CONTROL 10ML LL (SYRINGE) ×2 IMPLANT
TAPE CLOTH SURG 6X10 WHT LF (GAUZE/BANDAGES/DRESSINGS) ×1 IMPLANT
TOWEL OR 17X24 6PK STRL BLUE (TOWEL DISPOSABLE) ×2 IMPLANT
TOWEL OR 17X26 10 PK STRL BLUE (TOWEL DISPOSABLE) ×2 IMPLANT
WATER STERILE IRR 1000ML POUR (IV SOLUTION) ×1 IMPLANT

## 2014-04-18 NOTE — H&P (Signed)
  Gloria [name] is an 82 y.o. female.    Chief Complaint: right shoulder pain  HPI: Pt is a 81 y.o. female complaining of right shoulder pain since a fall last week. Pain had continually increased since the beginning. X-rays in the clinic show proximal humerus fracture with displacement. Various options are discussed with the patient. Risks, benefits and expectations were discussed with the patient. Patient understand the risks, benefits and expectations and wishes to proceed with surgery.   PCP:  [name], MD  D/C Plans:  Home   PMH: Past Medical History  Diagnosis Date  . Hypertension   . Hyperlipidemia   . GERD (gastroesophageal reflux disease)     hx of  . Arthritis     right  . Atrial fibrillation   . Irritable bowel syndrome   . Meningioma   . Hepatitis C   . PONV (postoperative nausea and vomiting)   . Humerus fracture     PSH: Past Surgical History  Procedure Laterality Date  . Total hip arthroplasty  11/07    left  . Parotid gland tumor excision    . Total knee arthroplasty    . Transthoracic echocardiogram  10/25/2009    EF 55-60%  . Cardiovascular stress test  11/14/2006    EF 67%  . Orif periprosthetic fracture Left 04/30/2013    Procedure: OPEN REDUCTION INTERNAL FIXATION (ORIF) PERIPROSTHETIC FRACTURE;  Surgeon: [name], MD;  Location: [place];  Service: Orthopedics;  Laterality: Left;  . Colonoscopy w/ biopsies and polypectomy    . Breast surgery      lumpectomy    Social History:  reports that she quit smoking about 33 years ago. Her smoking use included Cigarettes. She has a 4.5 pack-year smoking history. She has never used smokeless tobacco. She reports that she does not drink alcohol or use illicit drugs.  Allergies:  Allergies  Allergen Reactions  . Cephalexin Other (See Comments)    unknown  . Codeine Other (See Comments)    unknown  . Metoclopramide Hcl Other (See Comments)    unknown    Medications: Current  Facility-Administered Medications  Medication Dose Route Frequency Provider Last Rate Last Dose  . chlorhexidine (HIBICLENS) 4 % liquid 4 application  60 mL Topical Once Brad Denys Salinger, PA-C      . clindamycin (CLEOCIN) IVPB 900 mg  900 mg Intravenous On Call to [place], PA-C      . lactated ringers infusion   Intravenous Continuous Finis Bud, MD 50 mL/hr at 04/18/14 1049      No results found for this or any previous visit (from the past 48 hour(s)). No results found.  ROS: Pain with rom of the right upper extremity  Physical Exam:  Alert and oriented 81 y.o. female in no acute distress Cranial nerves 2-12 intact Cervical spine: full rom with no tenderness, nv intact distally Chest: active breath sounds bilaterally, no wheeze rhonchi or rales Heart: regular rate and rhythm, no murmur Abd: non tender non distended with active bowel sounds Hip is stable with rom  Limited rom and strength due to fracture nv intact distally No rashes or edema  Assessment/Plan Assessment: right proximal humerus fracture  Plan: Patient will undergo a right proximal humerus ORIF by Dr. Veverly Fells at [facility]. Risks benefits and expectations were discussed with the patient. Patient understand risks, benefits and expectations and wishes to proceed.

## 2014-04-18 NOTE — Interval H&P Note (Signed)
History and Physical Interval Note:  04/18/2014 12:18 PM  Gloria [name]  has presented today for surgery, with the diagnosis of right proximal humerous fracture  The various methods of treatment have been discussed with the patient and family. After consideration of risks, benefits and other options for treatment, the patient has consented to  Procedure(s): OPEN REDUCTION INTERNAL FIXATION (ORIF) RIGHT PROXIMAL HUMERUS FRACTURE (Right) as a surgical intervention .  The patient's history has been reviewed, patient examined, no change in status, stable for surgery.  I have reviewed the patient's chart and labs.  Questions were answered to the patient's satisfaction.     Genessa Beman,STEVEN R

## 2014-04-18 NOTE — Anesthesia Postprocedure Evaluation (Signed)
  Anesthesia Post-op Note  Patient: Gloria [name]  Procedure(s) Performed: Procedure(s): OPEN REDUCTION INTERNAL FIXATION (ORIF) RIGHT PROXIMAL HUMERUS FRACTURE (Right)  Patient Location: PACU  Anesthesia Type:General  Level of Consciousness: awake  Airway and Oxygen Therapy: Patient Spontanous Breathing  Post-op Pain: mild  Post-op Assessment: Post-op Vital signs reviewed  Post-op Vital Signs: Reviewed  Last Vitals:  Filed Vitals:   04/18/14 1005  BP: 159/48  Pulse: 54  Temp: 37.1 C  Resp: 18    Complications: No apparent anesthesia complications

## 2014-04-18 NOTE — Anesthesia Preprocedure Evaluation (Addendum)
Anesthesia Evaluation  Patient identified by MRN, date of birth, ID band Patient awake    Reviewed: Allergy & Precautions, NPO status   History of Anesthesia Complications (+) PONV  Airway Mallampati: II  TM Distance: >3 FB Neck ROM: Full    Dental   Pulmonary shortness of breath and with exertion, former smoker,  breath sounds clear to auscultation        Cardiovascular hypertension, Pt. on medications Rhythm:Regular Rate:Normal     Neuro/Psych    GI/Hepatic GERD-  Medicated,(+) Hepatitis -, C  Endo/Other    Renal/GU      Musculoskeletal  (+) Arthritis -,   Abdominal   Peds  Hematology   Anesthesia Other Findings   Reproductive/Obstetrics                           Anesthesia Physical Anesthesia Plan  ASA: III  Anesthesia Plan: General   Post-op Pain Management:    Induction: Intravenous  Airway Management Planned: Oral ETT  Additional Equipment:   Intra-op Plan:   Post-operative Plan: Possible Post-op intubation/ventilation  Informed Consent:   Dental advisory given  Plan Discussed with: CRNA and Anesthesiologist  Anesthesia Plan Comments:         Anesthesia Quick Evaluation

## 2014-04-18 NOTE — Progress Notes (Signed)
Call to Dr. Veverly Fells to request that orders be put into [facility] for surgery, consent, etc.

## 2014-04-18 NOTE — Brief Op Note (Signed)
04/18/2014  3:05 PM  PATIENT:  Gloria [name]  81 y.o. female  PRE-OPERATIVE DIAGNOSIS:  right proximal humerus fracture, displaced, comminuted  POST-OPERATIVE DIAGNOSIS:  right proximal humerus fracture, displaced, comminuted  PROCEDURE:  Procedure(s): OPEN REDUCTION INTERNAL FIXATION (ORIF) RIGHT PROXIMAL HUMERUS FRACTURE (Right) Biomet S3 Nail Plate  SURGEON:  Surgeon(s) and Role:    * Netta Cedars, MD - Primary  PHYSICIAN ASSISTANT:   ASSISTANTS: Ventura Bruns, PA-C   ANESTHESIA:   general  EBL:  Total I/O In: 1000 [I.V.:1000] Out: 200 [Blood:200]  BLOOD ADMINISTERED:none  DRAINS: none   LOCAL MEDICATIONS USED:  MARCAINE     SPECIMEN:  No Specimen  DISPOSITION OF SPECIMEN:  N/A  COUNTS:  YES  TOURNIQUET:  * No tourniquets in log *  DICTATION: .Other Dictation: Dictation Number [id number]  PLAN OF CARE: Admit to inpatient   PATIENT DISPOSITION:  PACU - hemodynamically stable.   Delay start of Pharmacological VTE agent (>24hrs) due to surgical blood loss or risk of bleeding: no

## 2014-04-18 NOTE — Progress Notes (Signed)
ANTICOAGULATION CONSULT NOTE - Initial Consult  Pharmacy Consult for Coumadin Indication: atrial fibrillation  Allergies  Allergen Reactions  . Cephalexin Other (See Comments)    unknown  . Codeine Other (See Comments)    unknown  . Metoclopramide Hcl Other (See Comments)    unknown    Patient Measurements: Weight: 147 lb (66.679 kg)  Vital Signs: Temp: 98.2 F (36.8 C) (04/11 1740) Temp Source: Oral (04/11 1005) BP: 162/70 mmHg (04/11 1740) Pulse Rate: 58 (04/11 1740)  Labs:  Recent Labs  04/18/14 1150  LABPROT 15.4*  INR 1.21    Estimated Creatinine Clearance: 46.2 mL/min (by C-G formula based on Cr of 0.86).   Medical History: Past Medical History  Diagnosis Date  . Hypertension   . Hyperlipidemia   . GERD (gastroesophageal reflux disease)     hx of  . Arthritis     right  . Atrial fibrillation   . Irritable bowel syndrome   . Meningioma   . Hepatitis C   . PONV (postoperative nausea and vomiting)   . Humerus fracture     Medications:  Prescriptions prior to admission  Medication Sig Dispense Refill Last Dose  . Calcium Carb-Cholecalciferol (CALCIUM 600 + D PO) Take 1 tablet by mouth 2 (two) times daily.   04/17/2014 at Unknown time  . flecainide (TAMBOCOR) 50 MG tablet Take 1 tablet (50 mg total) by mouth 2 (two) times daily. 180 tablet 1 04/18/2014 at Unknown time  . hydrochlorothiazide (HYDRODIURIL) 25 MG tablet Take 25 mg by mouth daily.   04/17/2014 at Unknown time  . Multiple Vitamins-Minerals (ICAPS MV PO) Take 1 capsule by mouth 2 (two) times daily.    04/17/2014 at Unknown time  . omeprazole (PRILOSEC) 20 MG capsule Take 20 mg by mouth every morning.    04/18/2014 at Unknown time  . oxyCODONE-acetaminophen (PERCOCET) 5-325 MG per tablet Take 1-2 tablets by mouth every 4 (four) hours as needed. 30 tablet 0 04/18/2014 at Unknown time  . Polyethyl Glycol-Propyl Glycol (SYSTANE ULTRA OP) Apply 1 drop to eye as needed (dry eyes).   04/12/2014 at Unknown  time  . vitamin B-12 (CYANOCOBALAMIN) 1000 MCG tablet Take 1,000 mcg by mouth daily.   04/17/2014 at Unknown time  . warfarin (COUMADIN) 5 MG tablet Take 1 tablet (5 mg total) by mouth daily. (Patient taking differently: Take 5 mg by mouth daily. Take 1 tablet all days except on Tues, Thurs, and Sat take 1 1/2 (7.5mg )) 30 tablet 1 04/14/2014    Assessment: 81 y.o. female presents with R shoulder pain -s/p ORIF 4/11. Pt on coumadin PTA for afib - held since 4/7 for surgery. To restart coumadin post-op. INR down to 1.21. CBC stable on 4/5.  Goal of Therapy:  INR 2-3 Monitor platelets by anticoagulation protocol: Yes   Plan:  Coumadin 7.5 mg tonight Daily INR  Sherlon Handing, PharmD, BCPS Clinical pharmacist, pager [phone] 04/18/2014,6:21 PM

## 2014-04-18 NOTE — Progress Notes (Signed)
Lab called and informed us that blood sample for PT will need to be redrawn due to sample hemolyzed. New order placed, lab called to re draw. Dr Oletta Lamas informed.

## 2014-04-18 NOTE — Plan of Care (Signed)
Problem: Consults Goal: Diagnosis - Shoulder Surgery ORIF R humerus fracture

## 2014-04-18 NOTE — Anesthesia Postprocedure Evaluation (Signed)
  Anesthesia Post-op Note  Patient: Gloria [name]  Procedure(s) Performed: Procedure(s): OPEN REDUCTION INTERNAL FIXATION (ORIF) RIGHT PROXIMAL HUMERUS FRACTURE (Right)  Patient Location: PACU  Anesthesia Type:General  Level of Consciousness: awake  Airway and Oxygen Therapy: Patient Spontanous Breathing  Post-op Pain: mild  Post-op Assessment: Post-op Vital signs reviewed  Post-op Vital Signs: Reviewed  Last Vitals:  Filed Vitals:   04/18/14 1600  BP:   Pulse:   Temp:   Resp: 22    Complications: No apparent anesthesia complications

## 2014-04-18 NOTE — Transfer of Care (Signed)
Immediate Anesthesia Transfer of Care Note  Patient: Gloria [name]  Procedure(s) Performed: Procedure(s): OPEN REDUCTION INTERNAL FIXATION (ORIF) RIGHT PROXIMAL HUMERUS FRACTURE (Right)  Patient Location: PACU  Anesthesia Type:General  Level of Consciousness: awake, alert  and sedated  Airway & Oxygen Therapy: Patient connected to face mask oxygen  Post-op Assessment: Post -op Vital signs reviewed and stable  Post vital signs: stable  Last Vitals:  Filed Vitals:   04/18/14 1005  BP: 159/48  Pulse: 54  Temp: 37.1 C  Resp: 18    Complications: No apparent anesthesia complications

## 2014-04-18 NOTE — Anesthesia Procedure Notes (Signed)
Procedure Name: Intubation Date/Time: 04/18/2014 12:50 PM Performed by: Maeola Harman Pre-anesthesia Checklist: Emergency Drugs available, Suction available, Patient being monitored, Timeout performed and Patient identified Patient Re-evaluated:Patient Re-evaluated prior to inductionOxygen Delivery Method: Circle system utilized Preoxygenation: Pre-oxygenation with 100% oxygen Intubation Type: IV induction Ventilation: Mask ventilation without difficulty Laryngoscope Size: Mac and 3 Grade View: Grade II Tube type: Oral Tube size: 7.0 mm Number of attempts: 1 Airway Equipment and Method: Stylet Placement Confirmation: ETT inserted through vocal cords under direct vision,  positive ETCO2 and breath sounds checked- equal and bilateral Secured at: 21 cm Tube secured with: Tape Dental Injury: Teeth and Oropharynx as per pre-operative assessment

## 2014-04-19 LAB — HEMOGLOBIN AND HEMATOCRIT, BLOOD
HCT: 28.2 % — ABNORMAL LOW (ref 36.0–46.0)
Hemoglobin: 9.3 g/dL — ABNORMAL LOW (ref 12.0–15.0)

## 2014-04-19 LAB — BASIC METABOLIC PANEL
Anion gap: 10 (ref 5–15)
BUN: 11 mg/dL (ref 6–23)
CO2: 28 mmol/L (ref 19–32)
Calcium: 8.7 mg/dL (ref 8.4–10.5)
Chloride: 99 mmol/L (ref 96–112)
Creatinine, Ser: 0.66 mg/dL (ref 0.50–1.10)
GFR calc Af Amer: >90 mL/min (ref >90)
GFR calc non Af Amer: 81 mL/min — ABNORMAL LOW (ref >90)
Glucose, Bld: 105 mg/dL — ABNORMAL HIGH (ref 70–99)
Potassium: 3.5 mmol/L (ref 3.5–5.1)
Sodium: 137 mmol/L (ref 135–145)

## 2014-04-19 LAB — PROTIME-INR
INR: 1.3 (ref 0.00–1.49)
Prothrombin Time: 16.3 seconds — ABNORMAL HIGH (ref 11.6–15.2)

## 2014-04-19 MED ORDER — WARFARIN SODIUM 7.5 MG PO TABS
7.5000 mg | ORAL_TABLET | Freq: Once | ORAL | Status: AC
Start: 2014-04-19 — End: 2014-04-19
  Administered 2014-04-19: 7.5 mg via ORAL
  Filled 2014-04-19: qty 1

## 2014-04-19 MED ORDER — PROSIGHT PO TABS
1.0000 | ORAL_TABLET | Freq: Two times a day (BID) | ORAL | Status: DC
  Administered 2014-04-19 – 2014-04-21 (×5): 1 via ORAL
  Filled 2014-04-19 (×7): qty 1

## 2014-04-19 NOTE — Progress Notes (Signed)
[place] for Coumadin Indication: atrial fibrillation  Allergies  Allergen Reactions  . Cephalexin Other (See Comments)    unknown  . Codeine Other (See Comments)    unknown  . Metoclopramide Hcl Other (See Comments)    unknown    Patient Measurements: Weight: 147 lb (66.679 kg)  Vital Signs: Temp: 98.2 F (36.8 C) (04/12 1300) BP: 131/51 mmHg (04/12 1300) Pulse Rate: 55 (04/12 1300)  Labs:  Recent Labs  04/18/14 1150 04/19/14 0637 04/19/14 1220  HGB  --  9.3*  --   HCT  --  28.2*  --   LABPROT 15.4*  --  16.3*  INR 1.21  --  1.30  CREATININE  --  0.66  --     Estimated Creatinine Clearance: 49.6 mL/min (by C-G formula based on Cr of 0.66).  Assessment: 82 y.o. female presents with R shoulder pain -s/p ORIF 4/11. Pt on coumadin PTA for afib - held since 4/7 for surgery.  Restart warfarin post-op on 4/11. INR 1.3 this morning, some ABLA post-op, but no overt bleeding noted.  Goal of Therapy:  INR 2-3 Monitor platelets by anticoagulation protocol: Yes   Plan:  -repeat warfarin 7.5 mg po x1 tonight -Daily INR -CBC in AM  Klare Criss D. Jordie Skalsky, PharmD, BCPS Clinical Pharmacist Pager: [phone] 04/19/2014 1:48 PM

## 2014-04-19 NOTE — Progress Notes (Signed)
   Subjective: 1 Day Post-Op Procedure(s) (LRB): OPEN REDUCTION INTERNAL FIXATION (ORIF) RIGHT PROXIMAL HUMERUS FRACTURE (Right)  Pt c/o moderate pain and soreness in the right shoulder this morning Pt would like to explore option of SNF due to poor condition of husband at home an little help Denies any other symptoms currently Patient reports pain as moderate.  Objective:   VITALS:   Filed Vitals:   04/19/14 0519  BP: 138/51  Pulse: 63  Temp: 97.7 F (36.5 C)  Resp: 16    Right shoulder dressing intact nv intact distally No drainage or erythema  LABS  Recent Labs  04/19/14 0637  HGB 9.3*  HCT 28.2*    No results for input(s): NA, K, BUN, CREATININE, GLUCOSE in the last 72 hours.   Assessment/Plan: 1 Day Post-Op Procedure(s) (LRB): OPEN REDUCTION INTERNAL FIXATION (ORIF) RIGHT PROXIMAL HUMERUS FRACTURE (Right) Pain management Pulmonary toilet Social work eval for possible SNF placement PT/OT     Merla Riches, MPAS, PA-C  04/19/2014, 7:18 AM

## 2014-04-19 NOTE — Clinical Social Work Placement (Signed)
Clinical Social Work Department CLINICAL SOCIAL WORK PLACEMENT NOTE 04/19/2014  Patient:  Gloria [name], Gloria [name]  Account Number:  [id number] [place] date:  04/18/2014  Clinical Social Worker:  Durward Fortes, CLINICAL SOCIAL WORKER  Date/time:  04/19/2014 02:49 PM  Clinical Social Work is seeking post-discharge placement for this patient at the following level of care:   SKILLED NURSING   (*CSW will update this form in Epic as items are completed)   04/19/2014  Patient/family provided with [place] Department of Clinical Social Work's list of facilities offering this level of care within the geographic area requested by the patient (or if unable, by the patient's family).  04/19/2014  Patient/family informed of their freedom to choose among providers that offer the needed level of care, that participate in Medicare, Medicaid or managed care program needed by the patient, have an available bed and are willing to accept the patient.  04/19/2014  Patient/family informed of [facility]' ownership interest in [facility], as well as of the fact that they are under no obligation to receive care at this facility.  PASARR submitted to EDS on 04/19/2014 PASARR number received on 04/19/2014  FL2 transmitted to all facilities in geographic area requested by pt/family on  04/19/2014 FL2 transmitted to all facilities within larger geographic area on   Patient informed that his/her managed care company has contracts with or will negotiate with  certain facilities, including the following:     Patient/family informed of bed offers received:   Patient chooses bed at  Physician recommends and patient chooses bed at    Patient to be transferred to  on   Patient to be transferred to facility by Family Patient and family notified of transfer on  Name of family member notified:    The following physician request were entered in Epic:   Additional Comments:   Sherrika Weakland S. Ninetta Adelstein, BSW-Intern

## 2014-04-19 NOTE — Evaluation (Signed)
Physical Therapy Evaluation Patient Details Name: Gloria [name] MRN: [id number] DOB: 1930/02/17 Today's Date: 04/19/2014   History of Present Illness  s/p fall with resulting R humeral fx. Underwetn ORIF R humerus 04/18/14.  Clinical Impression  Pt admitted with above diagnosis. Pt currently with functional limitations due to the deficits listed below (see PT Problem List). Pt will benefit from skilled PT to increase their independence and safety with mobility to allow discharge to the venue listed below.  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  Pt would like to go to [facility] as she has been there before and it is close to her home.     Follow Up Recommendations SNF    Equipment Recommendations  None recommended by PT    Recommendations for Other Services       Precautions / Restrictions Precautions Precautions: Shoulder Type of Shoulder Precautions: conservative - AROM elbow/wrist hand only. no shoulder ROM; sling at all times Shoulder Interventions: Shoulder sling/immobilizer;At all times;Off for dressing/bathing/exercises Precaution Booklet Issued: Yes (comment) Required Braces or Orthoses: Sling Restrictions Weight Bearing Restrictions: Yes RUE Weight Bearing: Non weight bearing      Mobility  Bed Mobility Overal bed mobility: Needs Assistance Bed Mobility: Supine to Sit     Supine to sit: Supervision     General bed mobility comments: up in recliner upon arrival  Transfers Overall transfer level: Needs assistance Equipment used: 1 person hand held assist Transfers: Sit to/from Stand Sit to Stand: Min assist Stand pivot transfers: Min assist       General transfer comment: Stood from Psychologist, occupational and commode  Ambulation/Gait Ambulation/Gait assistance: Min assist Ambulation Distance (Feet): 80 Feet (x 2) Assistive device:  (pushing IV pole) Gait  Pattern/deviations: Step-through pattern;Decreased stance time - left;Wide base of support Gait velocity: decreased   General Gait Details: Pt with wide BOS with slight limp due to L hip surgery last year.  No LOB, but general unsteadiness requiring MIN to MIN/guard  Stairs            Wheelchair Mobility    Modified Rankin (Stroke Patients Only)       Balance Overall balance assessment: Needs assistance;History of Falls   Sitting balance-Leahy Scale: Good     Standing balance support: Single extremity supported Standing balance-Leahy Scale: Poor                               Pertinent Vitals/Pain Pain Assessment: 0-10 Pain Score: 6  Pain Location: R shoulder Pain Descriptors / Indicators: Aching Pain Intervention(s): Ice applied;RN gave pain meds during session;Monitored during session;Limited activity within patient's tolerance    Home Living Family/patient expects to be discharged to:: Skilled nursing facility                 Additional Comments: 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 elbow/wrist hand A/AAROM WFL. only limited bypain         Lower Extremity Assessment: Overall WFL for tasks assessed;Generalized weakness (L hip surgery  last year)      Cervical / Trunk Assessment: Normal  Communication   Communication: No difficulties  Cognition Arousal/Alertness: Awake/alert Behavior During Therapy: WFL for tasks assessed/performed Overall Cognitive Status: Within Functional Limits for tasks assessed                      General Comments      Exercises        Assessment/Plan    PT  Assessment Patient needs continued PT services  PT Diagnosis Difficulty walking   PT Problem List Decreased strength;Decreased activity tolerance;Decreased balance;Decreased mobility  PT Treatment Interventions Gait training;Therapeutic activities;Therapeutic exercise;Functional mobility training;Balance training;DME instruction   PT Goals (Current goals can be found in the Care Plan section) Acute Rehab PT Goals Patient Stated Goal: to get better PT Goal Formulation: With patient Time For Goal Achievement: 05/03/14 Potential to Achieve Goals: Good    Frequency Min 5X/week   Barriers to discharge Decreased caregiver support      Co-evaluation               End of Session Equipment Utilized During Treatment: Gait belt Activity Tolerance: Patient tolerated treatment well Patient left: in chair;with call bell/phone within reach Nurse Communication: Mobility status;Patient requests pain meds         Time: 7209-4709 PT Time Calculation (min) (ACUTE ONLY): 21 min   Charges:   PT Evaluation $Initial PT Evaluation Tier I: 1 Procedure     PT G Codes:        Ajai Harville LUBECK 04/19/2014, 11:55 AM

## 2014-04-19 NOTE — Progress Notes (Signed)
Utilization review completed.  

## 2014-04-19 NOTE — Clinical Social Work Psychosocial (Signed)
Clinical Social Work Department BRIEF PSYCHOSOCIAL ASSESSMENT 04/19/2014  Patient:  Gloria [name], Gloria [name]     Account Number:  [id number]     [place] date:  04/18/2014  Clinical Social Worker:  Durward Fortes, CLINICAL SOCIAL WORKER  Date/Time:  04/19/2014 02:39 PM  Referred by:  Physician  Date Referred:  04/19/2014 Referred for  SNF Placement   Other Referral:   none.   Interview type:  Patient Other interview type:   none.    PSYCHOSOCIAL DATA Living Status:  HUSBAND Admitted from facility:   Level of care:   Primary support name:  Shellee Milo Primary support relationship to patient:  CHILD, ADULT Degree of support available:   Adequate support.    CURRENT CONCERNS Current Concerns  Post-Acute Placement   Other Concerns:   none.    SOCIAL WORK ASSESSMENT / PLAN CSW and BSW-Intern consulted regarding possible SNF placement for pt once medically stable for discharge.    BSW-Intern met with pt at bedside concerning pt's choice for SNF placement. Pt informed BSW-Intern that pt lives with pt's husband Lynann Bologna), however pt's husband is unable to care for pt due to early onset of dementia. Pt expressed the idea of furthering therapy at either [facility] or [facility].  Pt mentioned to BSW-Intern that pt's daughter Jenny Reichmann) is the person who comes in and checks on both pt and pt's husband. Pt describes pt's self as a very active person and still cares for pt's self and pt's husband.    Pt plans to return back home after the completion of therapy. Pt is very involved and concerned with  the care that pt is and will be receiving while in the SNF.    CSW and BSW-Intern to continue to assist with discharge planning needs.   Assessment/plan status:  Psychosocial Support/Ongoing Assessment of Needs Other assessment/ plan:   none.   Information/referral to community resources:   Pt to be discharged to [facility] once medically stable for discharge.     PATIENT'S/FAMILY'S RESPONSE TO PLAN OF CARE: Pt and pt's family agreeable and understandin gof CSW plan of care. Pt expressed no further concerns or questions at this time.       Virgie Dad Jourdon Zimmerle, BSW-Intern

## 2014-04-19 NOTE — Progress Notes (Signed)
Occupational Therapy Evaluation Patient Details Name: Gloria [name] MRN: [id number] DOB: 02/13/1930 Today's Date: 04/19/2014    History of Present Illness s/p fall with resulting R humeral fx. Underwent ORIF R humerus 04/18/14.   Clinical Impression   PTA, pt independent with ADL and mod I with mobility. History of falls. PTA, pt primary caregiver for husband who has dementia.Pt will need to D/C to SNF for short term rehab to maximize safety and independence with ADL and functional mobility for ADL to facilitate return to PLOF. Will follow acutely to address established goals. Pt in agreement to SND and would like [facility].     Follow Up Recommendations  SNF;Supervision/Assistance - 24 hour    Equipment Recommendations  None recommended by OT    Recommendations for Other Services       Precautions / Restrictions Precautions Precautions: Shoulder Type of Shoulder Precautions: conservative - AROM elbow/wrist hand only. no shoulder ROM; sling at all times Shoulder Interventions: Shoulder sling/immobilizer;At all times;Off for dressing/bathing/exercises Precaution Booklet Issued: Yes (comment) Required Braces or Orthoses: Sling Restrictions Weight Bearing Restrictions: Yes RUE Weight Bearing: Non weight bearing      Mobility Bed Mobility Overal bed mobility: Needs Assistance Bed Mobility: Supine to Sit     Supine to sit: Supervision        Transfers Overall transfer level: Needs assistance Equipment used: 1 person hand held assist Transfers: Sit to/from [facility] Sit to Stand: Min assist Stand pivot transfers: Min assist       General transfer comment: unsteady/fear of falling    Balance Overall balance assessment: Needs assistance;History of Falls   Sitting balance-Leahy Scale: Good     Standing balance support: Single extremity supported;During functional activity Standing balance-Leahy Scale: Poor                               ADL Overall ADL's : Needs assistance/impaired Eating/Feeding: Set up   Grooming: Moderate assistance;Sitting   Upper Body Bathing: Moderate assistance;Sitting   Lower Body Bathing: Minimal assistance;Sit to/from stand   Upper Body Dressing : Moderate assistance;Sitting   Lower Body Dressing: Moderate assistance;Sit to/from stand   Toilet Transfer: Minimal assistance;Ambulation   Toileting- Clothing Manipulation and Hygiene: Minimal assistance;Sit to/from stand       Functional mobility during ADLs: Minimal assistance General ADL Comments: Pt with history of falls. Unsteady during mobility but only required HHA. Began education regarding consevative protocol per Dr. Gilberto Better orders and ADL retraining. Pt given handout and reviewed.                     Pertinent Vitals/Pain Pain Assessment: 0-10 Pain Score: 5  Pain Location: R shoulder Pain Descriptors / Indicators: Aching;Cramping Pain Intervention(s): Limited activity within patient's tolerance;Monitored during session;Repositioned;Ice applied     Hand Dominance Right   Extremity/Trunk Assessment Upper Extremity Assessment Upper Extremity Assessment: RUE deficits/detail RUE Deficits / Details: no shoulder movement allowed. elbow/wrist hand A/AAROM WFL. only limited bypain RUE Coordination: decreased fine motor;decreased gross motor   Lower Extremity Assessment Lower Extremity Assessment: Generalized weakness (hx of hip and knee surgery)   Cervical / Trunk Assessment Cervical / Trunk Assessment: Normal   Communication Communication Communication: No difficulties   Cognition Arousal/Alertness: Awake/alert Behavior During Therapy: WFL for tasks assessed/performed Overall Cognitive Status: Within Functional Limits for tasks assessed  General Comments       Exercises       Shoulder Instructions      Home Living Family/patient expects to be discharged to:: Skilled nursing  facility                                        Prior Functioning/Environment Level of Independence: Independent with assistive device(s);Independent        Comments: used cane outside of house    OT Diagnosis: Generalized weakness;Acute pain   OT Problem List: Decreased strength;Decreased range of motion;Decreased activity tolerance;Impaired balance (sitting and/or standing);Decreased coordination;Decreased knowledge of precautions;Impaired UE functional use;Pain   OT Treatment/Interventions: Self-care/ADL training;Therapeutic exercise;DME and/or AE instruction;Therapeutic activities;Patient/family education;Balance training    OT Goals(Current goals can be found in the care plan section) Acute Rehab OT Goals Patient Stated Goal: to get better OT Goal Formulation: With patient Time For Goal Achievement: 05/03/14 Potential to Achieve Goals: Good ADL Goals Pt Will Perform Grooming: with set-up;with supervision;standing Pt Will Perform Upper Body Bathing: with set-up;sitting Pt Will Perform Upper Body Dressing: with set-up;sitting Pt Will Transfer to Toilet: with supervision;ambulating;bedside commode Pt Will Perform Toileting - Clothing Manipulation and hygiene: with supervision;sit to/from stand Additional ADL Goal #1: S with RUE elbow/wrist hand AROM and precautions  OT Frequency: Min 2X/week   Barriers to D/C: Decreased caregiver support          Co-evaluation              End of Session Equipment Utilized During Treatment: Gait belt Nurse Communication: Mobility status;Precautions;Weight bearing status  Activity Tolerance: Patient tolerated treatment well Patient left: in chair;with call bell/phone within reach   Time: 2761-4709 OT Time Calculation (min): 21 min Charges:  OT General Charges $OT Visit: 1 Procedure OT Evaluation $Initial OT Evaluation Tier I: 1 Procedure G-Codes:    Verdia Bolt,HILLARY May 12, 2014, 10:26 AM   Maurie Boettcher, OTR/L   [phone] 12-May-2014

## 2014-04-19 NOTE — Op Note (Signed)
NAMENAJMAH, CARRADINE              ACCOUNT NO.:  [id number]  MEDICAL RECORD NO.:  [id number]  LOCATION:  5N19C                        FACILITY:  [facility]  PHYSICIAN:  Doran Heater. Veverly Fells, M.D. DATE OF BIRTH:  1930/02/16  DATE OF PROCEDURE:  04/18/2014 DATE OF DISCHARGE:                              OPERATIVE REPORT   PREOPERATIVE DIAGNOSIS:  Right displaced comminuted proximal humerus fracture.  POSTOPERATIVE DIAGNOSIS:  Right displaced comminuted proximal humerus fracture.  PROCEDURE PERFORMED:  Open reduction and internal fixation of right proximal humerus fracture with greater tuberosity repair utilizing Biomet S3 nail plate.  ATTENDING SURGEON:  Doran Heater. Veverly Fells, M.D.  ASSISTANT:  Abbott Pao. Dixon, PA-C, who scrubbed the entire procedure and necessary for satisfactory completion of surgery.  ANESTHESIA:  General anesthesia was used plus local.  ESTIMATED BLOOD LOSS:  100 mL.  FLUID REPLACEMENT:  1200 mL of crystalloid.  INSTRUMENT COUNTS:  Correct.  COMPLICATIONS:  There were no complications.  ANTIBIOTICS:  Perioperative antibiotics were given.  INDICATIONS:  The patient is an 81 year old female who suffered a fall injuring her right shoulder.  The patient presented with diminished range of motion, swelling, pain, ecchymosis and x-rays demonstrating a comminuted displaced proximal humerus fracture with extreme displacement in greater tuberosity and the humeral head.  We discussed options of management with the patient given the significant displacement and the discontinuity of the patient's rotator cuff, we recommended surgery to try to repair the proximal humerus versus replace it.  Risks and benefits were discussed.  Informed consent obtained.  DESCRIPTION OF PROCEDURE:  After an adequate level of anesthesia was achieved, the patient was positioned in the modified beach-chair position.  Right shoulder correctly identified, sterilely prepped and draped in the usual  manner.  Time-out was called.  After a sterile prep and drape and time-out, we performed a deltopectoral incision starting at the coracoid process, extending down to the anterior humerus, dissection down to subcutaneous tissues.  We identified the cephalic vein, took it laterally with the deltoid, the pectoralis was taken medially.  The conjoined tendon was identified and tracked medially. Pectoral fascia released.  Hematoma was evacuated.  The patient's fracture was extremely displaced with the humeral shaft, actually posterior and the head rotated nearly 180 degrees and pointing laterally.  Fortunately, it was still attached with medial soft tissue around the lesser tuberosity area, so we went ahead and derotated the humeral head and put that back into appropriate alignment.  We were able to get the shaft aligned, placed the Biomet S3 plate just posterior to the bicipital groove.  We then went ahead and placed a central guide pin and made sure we had our implant placed correctly and the reduction satisfactory.  We then were able to place a total of 5 locked smooth pegs into the humeral head and then 2 unicortical screws that engaged the plate, the nail plate in the humerus off the jig, we were pleased with the stability of the construct and the alignment of the fracture. It was pretty close to right on the medial calcar area.  We then went ahead and we were able to achieve control over the greater tuberosity and the rotator cuff.  The  patient's bone was extremely soft.  We were able to place #2 FiberWire suture, at least 6 of those lateral to the greater tuberosity.  We repaired the rotator interval area and then the tuberosity back anatomically.  The available bone graft that did come out during the initial I and D was put that back underneath the head around those smooth pegs.  We then closed down the greater tuberosity. Repaired the great tuberosity to the lesser tuberosity with  mattress sutures that went over the top of the nail plate.  We were pleased with the reduction.  The rotator cuff everything moved together as a unit and was quite stable through a nice arc of motion.  We then obtained final x- rays, irrigated thoroughly, closed deltopectoral interval with 0 Vicryl suture followed by 2-0 Vicryl subcutaneous closure and 4-0 Monocryl for skin.  Steri-Strips applied followed by sterile dressing.  Patient tolerated the surgery well.     Doran Heater. Veverly Fells, M.D.     SRN/MEDQ  D:  04/18/2014  T:  04/19/2014  Job:  [id number]

## 2014-04-20 LAB — CBC
HCT: 31.4 % — ABNORMAL LOW (ref 36.0–46.0)
Hemoglobin: 10.1 g/dL — ABNORMAL LOW (ref 12.0–15.0)
MCH: 28.9 pg (ref 26.0–34.0)
MCHC: 32.2 g/dL (ref 30.0–36.0)
MCV: 90 fL (ref 78.0–100.0)
Platelets: 255 10*3/uL (ref 150–400)
RBC: 3.49 MIL/uL — ABNORMAL LOW (ref 3.87–5.11)
RDW: 15.1 % (ref 11.5–15.5)
WBC: 8.6 10*3/uL (ref 4.0–10.5)

## 2014-04-20 LAB — PROTIME-INR
INR: 1.48 (ref 0.00–1.49)
Prothrombin Time: 18 seconds — ABNORMAL HIGH (ref 11.6–15.2)

## 2014-04-20 MED ORDER — WARFARIN SODIUM 7.5 MG PO TABS
7.5000 mg | ORAL_TABLET | Freq: Once | ORAL | Status: AC
  Administered 2014-04-20: 7.5 mg via ORAL
  Filled 2014-04-20: qty 1

## 2014-04-20 NOTE — Progress Notes (Signed)
Physical Therapy Treatment Patient Details Name: Gloria [name] MRN: [id number] DOB: [birth year] Today's Date: 04/20/2014    History of Present Illness s/p fall with resulting R humeral fx. Underwetn ORIF R humerus 04/18/14.    PT Comments    Progressing towards physical therapy goals. Requires min assist for balance. States piror to her fall she has had episodes of dizziness upon standing. Also reports that she has to care for her husband at home for many daily tasks and is concerned about her safety if she returns without appropriate support. Patient will continue to benefit from skilled physical therapy services to further improve independence with functional mobility.   Follow Up Recommendations  SNF     Equipment Recommendations  None recommended by PT    Recommendations for Other Services       Precautions / Restrictions Precautions Precautions: Shoulder Type of Shoulder Precautions: conservative - AROM elbow/wrist hand only. no shoulder ROM; sling at all times Shoulder Interventions: Shoulder sling/immobilizer;At all times;Off for dressing/bathing/exercises Precaution Booklet Issued: Yes (comment) Required Braces or Orthoses: Sling Restrictions Weight Bearing Restrictions: Yes RUE Weight Bearing: Non weight bearing    Mobility  Bed Mobility               General bed mobility comments: up in recliner  Transfers Overall transfer level: Needs assistance Equipment used: 1 person hand held assist Transfers: Sit to/from Stand Sit to Stand: Min guard Stand pivot transfers: Min assist       General transfer comment: Min guard for safety. Mild sway noted but did not require physical assist. denies feeling dizzy.  Ambulation/Gait Ambulation/Gait assistance: Min assist Ambulation Distance (Feet): 250 Feet Assistive device: 1 person hand held assist Gait Pattern/deviations: Step-through pattern;Decreased step length - right;Decreased stance time - left;Decreased  stride length;Antalgic;Wide base of support;Trendelenburg Gait velocity: decreased   General Gait Details: Requires min assist for balance. Wide BOS, notable Lt trendelenburg. Practiced dynamic high marching for balance activity with increased assistance for stability.    Stairs            Wheelchair Mobility    Modified Rankin (Stroke Patients Only)       Balance Overall balance assessment: Needs assistance;History of Falls         Standing balance support: During functional activity;Single extremity supported Standing balance-Leahy Scale: Poor                      Cognition Arousal/Alertness: Awake/alert Behavior During Therapy: WFL for tasks assessed/performed Overall Cognitive Status: Within Functional Limits for tasks assessed                      Exercises General Exercises - Lower Extremity Ankle Circles/Pumps: AROM;Both;10 reps;Supine Long Arc Quad: Strengthening;Both;10 reps;Seated Hip Flexion/Marching: Strengthening;Both;10 reps;Seated Other Exercises Other Exercises: R elbow A/AAROM 15 reps Other Exercises: R elbow supination/pronation 10 reps Other Exercises: encouraged composite flecion/extension    General Comments        Pertinent Vitals/Pain Pain Assessment: 0-10 Pain Score:  ("Feel okay right now" no value given) Pain Location: Rt shoulder Pain Descriptors / Indicators: Aching Pain Intervention(s): Monitored during session;Repositioned;Ice applied    Home Living                      Prior Function            PT Goals (current goals can now be found in the care plan section) Acute Rehab PT Goals Patient  Stated Goal: to get better PT Goal Formulation: With patient Time For Goal Achievement: 05/03/14 Potential to Achieve Goals: Good Progress towards PT goals: Progressing toward goals    Frequency  Min 5X/week    PT Plan Current plan remains appropriate    Co-evaluation             End of  Session Equipment Utilized During Treatment: Gait belt Activity Tolerance: Patient tolerated treatment well Patient left: in chair;with call bell/phone within reach     Time: 1112-1131 PT Time Calculation (min) (ACUTE ONLY): 19 min  Charges:  $Gait Training: 8-22 mins                    G Codes:      Ellouise Newer 04-28-14, 1:00 PM Elayne Snare, [place]

## 2014-04-20 NOTE — Progress Notes (Signed)
Occupational Therapy Treatment Patient Details Name: Gloria [name] MRN: [id number] DOB: [birth year] Today's Date: 04/20/2014    History of present illness s/p fall with resulting R humeral fx. Underwetn ORIF R humerus 04/18/14.   OT comments  Pt states she is less painful today. Progressing however continue to recommend SNF for rehab. Pt with limited caregiver support and is high fall risk. Will follow acutely to facilitate rehab and D/C to next venue of care. Pt very appreciative of help.  Follow Up Recommendations  SNF;Supervision/Assistance - 24 hour    Equipment Recommendations  None recommended by OT    Recommendations for Other Services      Precautions / Restrictions Precautions Precautions: Shoulder Type of Shoulder Precautions: conservative - AROM elbow/wrist hand only. no shoulder ROM; sling at all times Shoulder Interventions: Shoulder sling/immobilizer;At all times;Off for dressing/bathing/exercises Precaution Booklet Issued: Yes (comment) Required Braces or Orthoses: Sling Restrictions Weight Bearing Restrictions: Yes RUE Weight Bearing: Non weight bearing       Mobility Bed Mobility               General bed mobility comments: up in recliner  Transfers Overall transfer level: Needs assistance Equipment used: 1 person hand held assist Transfers: Sit to/from [facility] Sit to Stand: Min guard Stand pivot transfers: Min assist            Balance Overall balance assessment: Needs assistance;History of Falls         Standing balance support: During functional activity;Single extremity supported Standing balance-Leahy Scale: Poor                     ADL                                       Functional mobility during ADLs: Minimal assistance (HHA) General ADL Comments: Educated pt on technique for donning/doffing sling. Requires mod A. Simulated dressing, requiring mod A. Completed functional mobility  with HHA. Pt had 2 balance checks and required assist to regain balance.                                      Cognition   Behavior During Therapy: WFL for tasks assessed/performed Overall Cognitive Status: Within Functional Limits for tasks assessed                       Extremity/Trunk Assessment   increased strength with R elbow movement today. Less c/o pain. vc to not use shoulder for ADL            Exercises Other Exercises Other Exercises: R elbow A/AAROM 15 reps Other Exercises: R elbow supination/pronation 10 reps Other Exercises: encouraged composite flecion/extension   Shoulder Instructions       General Comments      Pertinent Vitals/ Pain       Pain Assessment: 0-10 Pain Score: 2  Pain Location: R shoulder Pain Descriptors / Indicators: Aching Pain Intervention(s): Limited activity within patient's tolerance;Monitored during session;Ice applied;Repositioned  Home Living                                          Prior Functioning/Environment  Frequency Min 2X/week     Progress Toward Goals  OT Goals(current goals can now be found in the care plan section)  Progress towards OT goals: Progressing toward goals  Acute Rehab OT Goals Patient Stated Goal: to get better OT Goal Formulation: With patient Time For Goal Achievement: 05/03/14 Potential to Achieve Goals: Good ADL Goals Pt Will Perform Grooming: with set-up;with supervision;standing Pt Will Perform Upper Body Bathing: with set-up;sitting Pt Will Perform Upper Body Dressing: with set-up;sitting Pt Will Transfer to Toilet: with supervision;ambulating;bedside commode Pt Will Perform Toileting - Clothing Manipulation and hygiene: with supervision;sit to/from stand Additional ADL Goal #1: S with RUE elbow/wrist hand AROM and precautions  Plan Discharge plan remains appropriate    Co-evaluation                 End of Session  Equipment Utilized During Treatment: Gait belt   Activity Tolerance Patient tolerated treatment well   Patient Left in chair;with call bell/phone within reach   Nurse Communication Mobility status;Precautions;Weight bearing status        Time: 9735-3299 OT Time Calculation (min): 18 min  Charges: OT General Charges $OT Visit: 1 Procedure OT Treatments $Therapeutic Activity: 8-22 mins  Romey Cohea,HILLARY 04/20/2014, 11:27 AM   Maurie Boettcher, OTR/L  [phone] 04/20/2014.

## 2014-04-20 NOTE — Discharge Instructions (Signed)
Ice to the shoulder as much as possible  Keep a pillow propped behind the right arm to keep the forearm across the abdomen.  The patient should be able to see her elbow at all times.  No use of the right arm.  No push, pull, lift or pushing up out of a chair  Keep the incision clean and dry for one week, then ok to get wet in the shower.  Do exercises as instructed by occupational therapy each hour to prevent stiffness  NO WEIGHT BEARING WITH THE RIGHT ARM  Follow up in two weeks in the office  [phone]  --------------------------------------------------------------------------------------------------------------------------------------------- Information on my medicine - Coumadin   (Warfarin)  This medication education was reviewed with me or my healthcare representative as part of my discharge preparation.  The pharmacist that spoke with me during my hospital stay was:  Daje Stark, [place]  Why was Coumadin prescribed for you? Coumadin was prescribed for you because you have a blood clot or a medical condition that can cause an increased risk of forming blood clots. Blood clots can cause serious health problems by blocking the flow of blood to the heart, lung, or brain. Coumadin can prevent harmful blood clots from forming. As a reminder your indication for Coumadin is:   Stroke Prevention Because Of Atrial Fibrillation  What test will check on my response to Coumadin? While on Coumadin (warfarin) you will need to have an INR test regularly to ensure that your dose is keeping you in the desired range. The INR (international normalized ratio) number is calculated from the result of the laboratory test called prothrombin time (PT).  If an INR APPOINTMENT HAS NOT ALREADY BEEN MADE FOR YOU please schedule an appointment to have this lab work done by your health care provider within 7 days. Your INR goal is usually a number between:  2 to 3 or your provider may give you a more narrow range  like 2-2.5.  Ask your health care provider during an office visit what your goal INR is.  What  do you need to  know  About  COUMADIN? Take Coumadin (warfarin) exactly as prescribed by your healthcare provider about the same time each day.  DO NOT stop taking without talking to the doctor who prescribed the medication.  Stopping without other blood clot prevention medication to take the place of Coumadin may increase your risk of developing a new clot or stroke.  Get refills before you run out.  What do you do if you miss a dose? If you miss a dose, take it as soon as you remember on the same day then continue your regularly scheduled regimen the next day.  Do not take two doses of Coumadin at the same time.  Important Safety Information A possible side effect of Coumadin (Warfarin) is an increased risk of bleeding. You should call your healthcare provider right away if you experience any of the following: ? Bleeding from an injury or your nose that does not stop. ? Unusual colored urine (red or dark brown) or unusual colored stools (red or black). ? Unusual bruising for unknown reasons. ? A serious fall or if you hit your head (even if there is no bleeding).  Some foods or medicines interact with Coumadin (warfarin) and might alter your response to warfarin. To help avoid this: ? Eat a balanced diet, maintaining a consistent amount of Vitamin K. ? Notify your provider about major diet changes you plan to make. ? Avoid alcohol  or limit your intake to 1 drink for women and 2 drinks for men per day. (1 drink is 5 oz. wine, 12 oz. beer, or 1.5 oz. liquor.)  Make sure that ANY health care provider who prescribes medication for you knows that you are taking Coumadin (warfarin).  Also make sure the healthcare provider who is monitoring your Coumadin knows when you have started a new medication including herbals and non-prescription products.  Coumadin (Warfarin)  Major Drug Interactions   Increased Warfarin Effect Decreased Warfarin Effect  Alcohol (large quantities) Antibiotics (esp. Septra/Bactrim, Flagyl, Cipro) Amiodarone (Cordarone) Aspirin (ASA) Cimetidine (Tagamet) Megestrol (Megace) NSAIDs (ibuprofen, naproxen, etc.) Piroxicam (Feldene) Propafenone (Rythmol SR) Propranolol (Inderal) Isoniazid (INH) Posaconazole (Noxafil) Barbiturates (Phenobarbital) Carbamazepine (Tegretol) Chlordiazepoxide (Librium) Cholestyramine (Questran) Griseofulvin Oral Contraceptives Rifampin Sucralfate (Carafate) Vitamin K   Coumadin (Warfarin) Major Herbal Interactions  Increased Warfarin Effect Decreased Warfarin Effect  Garlic Ginseng Ginkgo biloba Coenzyme Q10 Green tea St. Johns wort    Coumadin (Warfarin) FOOD Interactions  Eat a consistent number of servings per week of foods HIGH in Vitamin K (1 serving =  cup)  Collards (cooked, or boiled & drained) Kale (cooked, or boiled & drained) Mustard greens (cooked, or boiled & drained) Parsley *serving size only =  cup Spinach (cooked, or boiled & drained) Swiss chard (cooked, or boiled & drained) Turnip greens (cooked, or boiled & drained)  Eat a consistent number of servings per week of foods MEDIUM-HIGH in Vitamin K (1 serving = 1 cup)  Asparagus (cooked, or boiled & drained) Broccoli (cooked, boiled & drained, or raw & chopped) Brussel sprouts (cooked, or boiled & drained) *serving size only =  cup Lettuce, raw (green leaf, endive, romaine) Spinach, raw Turnip greens, raw & chopped   These websites have more information on Coumadin (warfarin):  FailFactory.se; VeganReport.com.au;

## 2014-04-20 NOTE — Progress Notes (Signed)
[place] for Coumadin Indication: atrial fibrillation  Allergies  Allergen Reactions  . Cephalexin Other (See Comments)    unknown  . Codeine Other (See Comments)    unknown  . Metoclopramide Hcl Other (See Comments)    unknown    Patient Measurements: Weight: 147 lb (66.679 kg)  Vital Signs: Temp: 98.1 F (36.7 C) (04/13 0543) BP: 166/61 mmHg (04/13 0543) Pulse Rate: 63 (04/13 0543)  Labs:  Recent Labs  04/18/14 1150 04/19/14 0637 04/19/14 1220 04/20/14 0550  HGB  --  9.3*  --  10.1*  HCT  --  28.2*  --  31.4*  PLT  --   --   --  255  LABPROT 15.4*  --  16.3* 18.0*  INR 1.21  --  1.30 1.48  CREATININE  --  0.66  --   --     Estimated Creatinine Clearance: 49.6 mL/min (by C-G formula based on Cr of 0.66).  Assessment: 81 y.o. female presents with R shoulder pain -s/p ORIF 4/11. Pt on coumadin PTA for afib - held since 4/7 for surgery.  Restart warfarin post-op on 4/11. Some ABLA post-op, but no overt bleeding noted, Hgb increased to 10.1 this morning. INR has been increasing nicely- up to 1.48 today.  Goal of Therapy:  INR 2-3 Monitor platelets by anticoagulation protocol: Yes   Plan:  -repeat warfarin 7.5 mg po x1 tonight -Daily INR -CBC in AM  Keonta Monceaux D. Parneet Glantz, PharmD, BCPS Clinical Pharmacist Pager: [phone] 04/20/2014 11:07 AM

## 2014-04-21 ENCOUNTER — Encounter (HOSPITAL_COMMUNITY): Payer: Self-pay | Admitting: Orthopedic Surgery

## 2014-04-21 DIAGNOSIS — B1920 Unspecified viral hepatitis C without hepatic coma: Secondary | ICD-10-CM | POA: Diagnosis not present

## 2014-04-21 DIAGNOSIS — E785 Hyperlipidemia, unspecified: Secondary | ICD-10-CM | POA: Diagnosis not present

## 2014-04-21 DIAGNOSIS — S42309D Unspecified fracture of shaft of humerus, unspecified arm, subsequent encounter for fracture with routine healing: Secondary | ICD-10-CM | POA: Diagnosis not present

## 2014-04-21 DIAGNOSIS — E46 Unspecified protein-calorie malnutrition: Secondary | ICD-10-CM | POA: Diagnosis not present

## 2014-04-21 DIAGNOSIS — R112 Nausea with vomiting, unspecified: Secondary | ICD-10-CM | POA: Diagnosis not present

## 2014-04-21 DIAGNOSIS — S42201D Unspecified fracture of upper end of right humerus, subsequent encounter for fracture with routine healing: Secondary | ICD-10-CM | POA: Diagnosis not present

## 2014-04-21 DIAGNOSIS — I10 Essential (primary) hypertension: Secondary | ICD-10-CM | POA: Diagnosis not present

## 2014-04-21 DIAGNOSIS — K589 Irritable bowel syndrome without diarrhea: Secondary | ICD-10-CM | POA: Diagnosis not present

## 2014-04-21 DIAGNOSIS — S42201A Unspecified fracture of upper end of right humerus, initial encounter for closed fracture: Secondary | ICD-10-CM | POA: Diagnosis not present

## 2014-04-21 DIAGNOSIS — Z4789 Encounter for other orthopedic aftercare: Secondary | ICD-10-CM | POA: Diagnosis not present

## 2014-04-21 DIAGNOSIS — M1990 Unspecified osteoarthritis, unspecified site: Secondary | ICD-10-CM | POA: Diagnosis not present

## 2014-04-21 DIAGNOSIS — I480 Paroxysmal atrial fibrillation: Secondary | ICD-10-CM | POA: Diagnosis not present

## 2014-04-21 DIAGNOSIS — S4291XD Fracture of right shoulder girdle, part unspecified, subsequent encounter for fracture with routine healing: Secondary | ICD-10-CM | POA: Diagnosis not present

## 2014-04-21 DIAGNOSIS — I4891 Unspecified atrial fibrillation: Secondary | ICD-10-CM | POA: Diagnosis not present

## 2014-04-21 DIAGNOSIS — M25559 Pain in unspecified hip: Secondary | ICD-10-CM | POA: Diagnosis not present

## 2014-04-21 DIAGNOSIS — M25552 Pain in left hip: Secondary | ICD-10-CM | POA: Diagnosis not present

## 2014-04-21 DIAGNOSIS — S42301A Unspecified fracture of shaft of humerus, right arm, initial encounter for closed fracture: Secondary | ICD-10-CM | POA: Diagnosis not present

## 2014-04-21 DIAGNOSIS — S42301D Unspecified fracture of shaft of humerus, right arm, subsequent encounter for fracture with routine healing: Secondary | ICD-10-CM | POA: Diagnosis not present

## 2014-04-21 DIAGNOSIS — K219 Gastro-esophageal reflux disease without esophagitis: Secondary | ICD-10-CM | POA: Diagnosis not present

## 2014-04-21 DIAGNOSIS — M545 Low back pain: Secondary | ICD-10-CM | POA: Diagnosis not present

## 2014-04-21 DIAGNOSIS — K3184 Gastroparesis: Secondary | ICD-10-CM | POA: Diagnosis not present

## 2014-04-21 DIAGNOSIS — S42231D 3-part fracture of surgical neck of right humerus, subsequent encounter for fracture with routine healing: Secondary | ICD-10-CM | POA: Diagnosis not present

## 2014-04-21 DIAGNOSIS — D329 Benign neoplasm of meninges, unspecified: Secondary | ICD-10-CM | POA: Diagnosis not present

## 2014-04-21 LAB — PROTIME-INR
INR: 1.61 — ABNORMAL HIGH (ref 0.00–1.49)
Prothrombin Time: 19.3 seconds — ABNORMAL HIGH (ref 11.6–15.2)

## 2014-04-21 MED ORDER — WARFARIN SODIUM 5 MG PO TABS: 5.0000 mg | ORAL_TABLET | Freq: Every day | ORAL | Status: DC

## 2014-04-21 NOTE — Progress Notes (Signed)
   Subjective: 3 Days Post-Op Procedure(s) (LRB): OPEN REDUCTION INTERNAL FIXATION (ORIF) RIGHT PROXIMAL HUMERUS FRACTURE (Right)  Pt c/o moderate pain in the shoulder but pain medicine working well Plan for snf placement today Denies any new symptoms or issues Patient reports pain as moderate.  Objective:   VITALS:   Filed Vitals:   04/21/14 0530  BP: 146/58  Pulse: 61  Temp: 98.4 F (36.9 C)  Resp: 18    Right shoulder incision healing well Dressing changed nv intact distally No rashes erythema or edema  LABS  Recent Labs  04/19/14 0637 04/20/14 0550  HGB 9.3* 10.1*  HCT 28.2* 31.4*  WBC  --  8.6  PLT  --  255     Recent Labs  04/19/14 0637  NA 137  K 3.5  BUN 11  CREATININE 0.66  GLUCOSE 105*     Assessment/Plan: 3 Days Post-Op Procedure(s) (LRB): OPEN REDUCTION INTERNAL FIXATION (ORIF) RIGHT PROXIMAL HUMERUS FRACTURE (Right) D/c to snf today PT/OT as able Pain management Pulmonary toilet     Merla Riches, MPAS, PA-C  04/21/2014, 8:36 AM

## 2014-04-21 NOTE — Clinical Social Work Placement (Signed)
Clinical Social Work Department CLINICAL SOCIAL WORK PLACEMENT NOTE 04/19/2014  Patient:  Gloria [name], Gloria [name]  Account Number:  [id number] [place] date:  04/18/2014  Clinical Social Worker:  Durward Fortes, CLINICAL SOCIAL WORKER  Date/time:  04/19/2014 02:49 PM  Clinical Social Work is seeking post-discharge placement for this patient at the following level of care:   SKILLED NURSING   (*CSW will update this form in Epic as items are completed)   04/19/2014  Patient/family provided with [facility] Department of Clinical Social Work's list of facilities offering this level of care within the geographic area requested by the patient (or if unable, by the patient's family).  04/19/2014  Patient/family informed of their freedom to choose among providers that offer the needed level of care, that participate in Medicare, Medicaid or managed care program needed by the patient, have an available bed and are willing to accept the patient.  04/19/2014  Patient/family informed of [facility]' ownership interest in [facility], as well as of the fact that they are under no obligation to receive care at this facility.  PASARR submitted to EDS on 04/19/2014 PASARR number received on 04/19/2014  FL2 transmitted to all facilities in geographic area requested by pt/family on  04/19/2014 FL2 transmitted to all facilities within larger geographic area on   Patient informed that his/her managed care company has contracts with or will negotiate with  certain facilities, including the following:     Patient/family informed of bed offers received:  04/20/2014 Patient chooses bed at [facility] Physician recommends and patient chooses bed at    Patient to be transferred to  [facility] on  04/21/2014 Patient to be transferred to facility by [facility] Patient and family notified of transfer on 04/21/2014 Name of family member notified:  Patient and family notified at bedside  The  following physician request were entered in Epic:   Additional Comments:   Kierra S. Wiley, BSW-Intern   [place] Cell: [phone]       Fax: [phone] Clinical Social Work: Orthopedics [phone]) and Surgical [phone])

## 2014-04-21 NOTE — Plan of Care (Signed)
Problem: Consults Goal: Diagnosis - Shoulder Surgery Outcome: Completed/Met Date Met:  04/21/14  ORIF R humerus

## 2014-04-21 NOTE — Discharge Planning (Signed)
Patient to be discharged to [facility]. Patient and patient's family updated at bedside.  Facility: [facility] Report number: [phone] Transport: EMS ([place])  Lubertha Sayres, [place] Cell: [phone]       Fax: [phone] Clinical Social Work: Orthopedics [phone]) and Surgical [phone])

## 2014-04-21 NOTE — Progress Notes (Signed)
[facility] for Coumadin Indication: atrial fibrillation  Allergies  Allergen Reactions  . Cephalexin Other (See Comments)    unknown  . Codeine Other (See Comments)    unknown  . Metoclopramide Hcl Other (See Comments)    unknown    Patient Measurements: Weight: 147 lb (66.679 kg)  Vital Signs: Temp: 98.4 F (36.9 C) (04/14 0530) Temp Source: Oral (04/14 0530) BP: 146/58 mmHg (04/14 0530) Pulse Rate: 61 (04/14 0530)  Labs:  Recent Labs  04/19/14 0637 04/19/14 1220 04/20/14 0550 04/21/14 0535  HGB 9.3*  --  10.1*  --   HCT 28.2*  --  31.4*  --   PLT  --   --  255  --   LABPROT  --  16.3* 18.0* 19.3*  INR  --  1.30 1.48 1.61*  CREATININE 0.66  --   --   --     Estimated Creatinine Clearance: 49.6 mL/min (by C-G formula based on Cr of 0.66).  Assessment: 81 y.o. female presents with R shoulder pain -s/p ORIF 4/11. Pt on coumadin PTA for afib - held since 4/7 for surgery.  Restart warfarin post-op on 4/11. Some ABLA post-op, but no overt bleeding noted. INR has been increasing nicely- up to 1.61 today.  Noted patient is to discharge to SNF today.  Goal of Therapy:  INR 2-3 Monitor platelets by anticoagulation protocol: Yes   Plan:  -resume home dose of warfarin on discharge- 5mg  daily except 7.5mg  on TTSat. This has been updated on discharge med list. -Daily INR -CBC in AM  Zamoria Boss D. Cederick Broadnax, PharmD, BCPS Clinical Pharmacist Pager: [phone] 04/21/2014 9:06 AM

## 2014-04-21 NOTE — Progress Notes (Signed)
Report given to [facility] facility, all questions were answered.

## 2014-04-21 NOTE — Discharge Summary (Signed)
Physician Discharge Summary   Patient ID: Gloria [name] MRN: [id number] DOB/AGE: 1930/06/27 81 y.o.  Admit date: 04/18/2014 Discharge date: 04/21/2014  Admission Diagnoses:  Active Problems:   Shoulder fracture   Discharge Diagnoses:  Same   Surgeries: Procedure(s): OPEN REDUCTION INTERNAL FIXATION (ORIF) RIGHT PROXIMAL HUMERUS FRACTURE on 04/18/2014   Consultants: PT/OT  Discharged Condition: Stable  Hospital Course: Gloria [name] is an 81 y.o. female who was admitted 04/18/2014 with a chief complaint of No chief complaint on file. , and found to have a diagnosis of <principal problem not specified>.  They were brought to the operating room on 04/18/2014 and underwent the above named procedures.    The patient had an uncomplicated hospital course and was stable for discharge.  Recent vital signs:  Filed Vitals:   04/21/14 0530  BP: 146/58  Pulse: 61  Temp: 98.4 F (36.9 C)  Resp: 18    Recent laboratory studies:  Results for orders placed or performed during the hospital encounter of 04/18/14  Protime-INR  Result Value Ref Range   Prothrombin Time 15.4 (H) 11.6 - 15.2 seconds   INR 1.21 0.00 - 1.49  Hemoglobin and hematocrit, blood  Result Value Ref Range   Hemoglobin 9.3 (L) 12.0 - 15.0 g/dL   HCT 28.2 (L) 36.0 - 01.0 %  Basic metabolic panel  Result Value Ref Range   Sodium 137 135 - 145 mmol/L   Potassium 3.5 3.5 - 5.1 mmol/L   Chloride 99 96 - 112 mmol/L   CO2 28 19 - 32 mmol/L   Glucose, Bld 105 (H) 70 - 99 mg/dL   BUN 11 6 - 23 mg/dL   Creatinine, Ser 0.66 0.50 - 1.10 mg/dL   Calcium 8.7 8.4 - 10.5 mg/dL   GFR calc non Af Amer 81 (L) >90 mL/min   GFR calc Af Amer >90 >90 mL/min   Anion gap 10 5 - 15  Protime-INR  Result Value Ref Range   Prothrombin Time 16.3 (H) 11.6 - 15.2 seconds   INR 1.30 0.00 - 1.49  Protime-INR  Result Value Ref Range   Prothrombin Time 18.0 (H) 11.6 - 15.2 seconds   INR 1.48 0.00 - 1.49  CBC  Result Value Ref  Range   WBC 8.6 4.0 - 10.5 K/uL   RBC 3.49 (L) 3.87 - 5.11 MIL/uL   Hemoglobin 10.1 (L) 12.0 - 15.0 g/dL   HCT 31.4 (L) 36.0 - 46.0 %   MCV 90.0 78.0 - 100.0 fL   MCH 28.9 26.0 - 34.0 pg   MCHC 32.2 30.0 - 36.0 g/dL   RDW 15.1 11.5 - 15.5 %   Platelets 255 150 - 400 K/uL  Protime-INR  Result Value Ref Range   Prothrombin Time 19.3 (H) 11.6 - 15.2 seconds   INR 1.61 (H) 0.00 - 1.49    Discharge Medications:     Medication List    TAKE these medications        CALCIUM 600 + D PO  Take 1 tablet by mouth 2 (two) times daily.     flecainide 50 MG tablet  Commonly known as:  TAMBOCOR  Take 1 tablet (50 mg total) by mouth 2 (two) times daily.     hydrochlorothiazide 25 MG tablet  Commonly known as:  HYDRODIURIL  Take 25 mg by mouth daily.     ICAPS MV PO  Take 1 capsule by mouth 2 (two) times daily.     omeprazole 20 MG capsule  Commonly known as:  PRILOSEC  Take 20 mg by mouth every morning.     oxyCODONE-acetaminophen 5-325 MG per tablet  Commonly known as:  PERCOCET  Take 1-2 tablets by mouth every 4 (four) hours as needed.     oxyCODONE-acetaminophen 5-325 MG per tablet  Commonly known as:  ROXICET  Take 1-2 tablets by mouth every 4 (four) hours as needed for severe pain.     SYSTANE ULTRA OP  Apply 1 drop to eye as needed (dry eyes).     vitamin B-12 1000 MCG tablet  Commonly known as:  CYANOCOBALAMIN  Take 1,000 mcg by mouth daily.     warfarin 5 MG tablet  Commonly known as:  COUMADIN  Take 1 tablet (5 mg total) by mouth daily.        Diagnostic Studies: Dg Shoulder Right  04/12/2014   CLINICAL DATA:  Fall onto a fire place. Pain in right shoulder. Difficulty moving the right arm.  EXAM: RIGHT SHOULDER - 2+ VIEW  COMPARISON:  None.  FINDINGS: Surgical neck fracture of the right humerus observed. I suspect greater than 1 cm displacement. Also, there appears to be a greater tuberosity fragment which is displaced. This suggests a 3 part fracture.  Bony  demineralization.  IMPRESSION: 1. Surgical neck fracture of the right humerus, thought to be at least 3 part.   Electronically Signed   By: Van Clines M.D.   On: 04/12/2014 11:18   Ct Chest W Contrast  04/12/2014   CLINICAL DATA:  Right shoulder fracture. Right upper quadrant abdominal pain radiating to the back. Fall. Anti coagulation. Right chest pain.  EXAM: CT CHEST, ABDOMEN, AND PELVIS WITH CONTRAST  TECHNIQUE: Multidetector CT imaging of the chest, abdomen and pelvis was performed following the standard protocol during bolus administration of intravenous contrast.  CONTRAST:  162mL OMNIPAQUE IOHEXOL 300 MG/ML  SOLN  COMPARISON:  Multiple exams, including 05/19/2013  FINDINGS: CT CHEST FINDINGS  Mediastinum/Nodes: Atherosclerotic calcification of the aortic arch, branch vessels, and coronary arteries noted. Old granulomatous disease observed with calcified AP window and left hilar lymph nodes.  Right lower paratracheal lymph node 1.1 cm on image 24 series 2, previously the same.  Lungs/Pleura: Calcified left upper lobe granuloma, image 21 series 3, benign.  Linear subsegmental atelectasis or scarring in the posterior basal segment left lower lobe.  Musculoskeletal: Displaced surgical neck fracture of the right proximal humerus observed. Various fragments are present. Mild grade 1 degenerative anterior subluxation of T2 on T3.  CT ABDOMEN PELVIS FINDINGS  Hepatobiliary: Scattered punctate calcifications in the liver and spleen compatible with old granulomatous disease, likely histoplasmosis.  Hypodense lesion in segment 5 of the liver, image 58 series 2, 3 mm in diameter. Similar lesion (although smaller) in segment 6. The former lesion is minimally larger than on 04/08/2011 but still probably benign; the lateral lesion is not well seen previously, not surprising given that it is in the 2 mm range.  Pancreas: Unremarkable  Spleen: Punctate calcifications from remote granulomatous disease.   Adrenals/Urinary Tract: Fluid density 1.9 cm cyst of the left mid to lower kidney, Bosniak category 1. Nonspecific hypodense lesion in the left mid kidney measuring 7 mm in diameter, too small to characterize, although probably a benign cyst. Distal ureters and parts the urinary bladder obscured by streak artifact from the bilateral hip implants.  Stomach/Bowel: Unremarkable  Vascular/Lymphatic: Aortoiliac atherosclerotic vascular disease.  Reproductive: Partially obscured by streak artifact ; uterus unremarkable where visualized.  Other: No supplemental  non-categorized findings.  Musculoskeletal: A multilevel lumbar spondylosis and degenerative disc disease including a large disc bulge at L1-2. There is mild left foraminal impingement at L3-4 due to intervertebral and facet spurring, and mild right foraminal stenosis at L4-5 and L5-S1 due to intervertebral and facet spurring as well.  IMPRESSION: 1. Displaced surgical neck fracture the right proximal humerus. 2. No other acute traumatic findings. Ancillary findings include atherosclerosis ; a chronic mildly enlarged right lower paratracheal lymph node; old granulomatous disease; several tiny hypodense liver lesions which are highly likely to be benign; several benign-appearing renal cysts; and multilevel lumbar spondylosis and degenerative disc disease causing mild foraminal impingement at several levels.   Electronically Signed   By: Van Clines M.D.   On: 04/12/2014 12:14   Ct Abdomen Pelvis W Contrast  04/12/2014   CLINICAL DATA:  Right shoulder fracture. Right upper quadrant abdominal pain radiating to the back. Fall. Anti coagulation. Right chest pain.  EXAM: CT CHEST, ABDOMEN, AND PELVIS WITH CONTRAST  TECHNIQUE: Multidetector CT imaging of the chest, abdomen and pelvis was performed following the standard protocol during bolus administration of intravenous contrast.  CONTRAST:  123mL OMNIPAQUE IOHEXOL 300 MG/ML  SOLN  COMPARISON:  Multiple exams,  including 05/19/2013  FINDINGS: CT CHEST FINDINGS  Mediastinum/Nodes: Atherosclerotic calcification of the aortic arch, branch vessels, and coronary arteries noted. Old granulomatous disease observed with calcified AP window and left hilar lymph nodes.  Right lower paratracheal lymph node 1.1 cm on image 24 series 2, previously the same.  Lungs/Pleura: Calcified left upper lobe granuloma, image 21 series 3, benign.  Linear subsegmental atelectasis or scarring in the posterior basal segment left lower lobe.  Musculoskeletal: Displaced surgical neck fracture of the right proximal humerus observed. Various fragments are present. Mild grade 1 degenerative anterior subluxation of T2 on T3.  CT ABDOMEN PELVIS FINDINGS  Hepatobiliary: Scattered punctate calcifications in the liver and spleen compatible with old granulomatous disease, likely histoplasmosis.  Hypodense lesion in segment 5 of the liver, image 58 series 2, 3 mm in diameter. Similar lesion (although smaller) in segment 6. The former lesion is minimally larger than on 04/08/2011 but still probably benign; the lateral lesion is not well seen previously, not surprising given that it is in the 2 mm range.  Pancreas: Unremarkable  Spleen: Punctate calcifications from remote granulomatous disease.  Adrenals/Urinary Tract: Fluid density 1.9 cm cyst of the left mid to lower kidney, Bosniak category 1. Nonspecific hypodense lesion in the left mid kidney measuring 7 mm in diameter, too small to characterize, although probably a benign cyst. Distal ureters and parts the urinary bladder obscured by streak artifact from the bilateral hip implants.  Stomach/Bowel: Unremarkable  Vascular/Lymphatic: Aortoiliac atherosclerotic vascular disease.  Reproductive: Partially obscured by streak artifact ; uterus unremarkable where visualized.  Other: No supplemental non-categorized findings.  Musculoskeletal: A multilevel lumbar spondylosis and degenerative disc disease including a  large disc bulge at L1-2. There is mild left foraminal impingement at L3-4 due to intervertebral and facet spurring, and mild right foraminal stenosis at L4-5 and L5-S1 due to intervertebral and facet spurring as well.  IMPRESSION: 1. Displaced surgical neck fracture the right proximal humerus. 2. No other acute traumatic findings. Ancillary findings include atherosclerosis ; a chronic mildly enlarged right lower paratracheal lymph node; old granulomatous disease; several tiny hypodense liver lesions which are highly likely to be benign; several benign-appearing renal cysts; and multilevel lumbar spondylosis and degenerative disc disease causing mild foraminal impingement at several levels.  Electronically Signed   By: Van Clines M.D.   On: 04/12/2014 12:14   Dg Humerus Right  04/12/2014   CLINICAL DATA:  Lost her balance and fell while getting out of a chair this morning at home, fell into fireplace, RIGHT shoulder pain traveling down arm, initial encounter  EXAM: RIGHT HUMERUS - 2+ VIEW  COMPARISON:  11/25/2008  FINDINGS: Comminuted displaced proximal RIGHT humeral fracture.  No gross evidence of dislocation.  Elbow joint alignment suboptimally visualized.  Remainder of humerus appears intact.  AC joint alignment normal.  Diffuse osseous demineralization.  IMPRESSION: Comminuted displaced proximal RIGHT humeral fracture.  Osseous demineralization.   Electronically Signed   By: Lavonia [place] M.D.   On: 04/12/2014 11:18    Disposition: 01-Home or Self Care        Follow-up Information    Follow up with NORRIS,STEVEN R, MD. Call in 2 weeks.   Specialty:  Orthopedic Surgery   Why:  [phone]   Contact information:   [address] [place] [address] [phone]        Signed: Ventura Bruns 04/21/2014, 8:38 AM

## 2014-04-21 NOTE — Progress Notes (Signed)
Occupational Therapy Treatment Patient Details Name: Gloria [name] MRN: [id number] DOB: 01-25-1930 Today's Date: 04/21/2014    History of present illness s/p fall with resulting R humeral fx. Underwetn ORIF R humerus 04/18/14.   OT comments  Making excellent progress. Completed education with pt's daughter and answered questions regarding use of RUE and compensatory techniques for ADL. Anticipate D/C to SNF.  Follow Up Recommendations  SNF;Supervision/Assistance - 24 hour    Equipment Recommendations  None recommended by OT          Precautions / Restrictions Precautions Precautions: Shoulder Type of Shoulder Precautions: conservative - AROM elbow/wrist hand only. no shoulder ROM; sling at all times Shoulder Interventions: Shoulder sling/immobilizer;At all times;Off for dressing/bathing/exercises Required Braces or Orthoses: Sling Restrictions Weight Bearing Restrictions: Yes RUE Weight Bearing: Non weight bearing       Mobility Bed Mobility Overal bed mobility: Modified Independent                Transfers Overall transfer level: Needs assistance   Transfers: Sit to/from Stand;Stand Pivot Transfers Sit to Stand: Supervision Stand pivot transfers: Min guard            Balance Overall balance assessment: Needs assistance           Standing balance-Leahy Scale: Fair                     ADL                                         General ADL Comments: Completed education with family regarding compensatory techniques for ADL  during ADL to prepare pt to D/C to SNF. All questions answered. Family asking about clothing type needed for SNF given pt only able to use 1 arm.                                      Cognition   Behavior During Therapy: WFL for tasks assessed/performed Overall Cognitive Status: Within Functional Limits for tasks assessed                                      Exercises  Other Exercises Other Exercises: R elbow A/AAROM 15 reps Other Exercises: R elbow supination/pronation 10 reps Other Exercises: encouraged composite flexion/extension                Pertinent Vitals/ Pain       Pain Assessment: 0-10 Pain Score: 2  Pain Location: R shoulder Pain Descriptors / Indicators: Sore Pain Intervention(s): Monitored during session;Repositioned;Ice applied  Home Living                                          Prior Functioning/Environment              Frequency Min 2X/week     Progress Toward Goals  OT Goals(current goals can now be found in the care plan section)  Progress towards OT goals: Progressing toward goals  Acute Rehab OT Goals Patient Stated Goal: to get better OT Goal Formulation: With patient Time For Goal Achievement: 05/03/14 Potential to Achieve  Goals: Good ADL Goals Pt Will Perform Grooming: with set-up;with supervision;standing Pt Will Perform Upper Body Bathing: with set-up;sitting Pt Will Perform Upper Body Dressing: with set-up;sitting Pt Will Transfer to Toilet: with supervision;ambulating;bedside commode Pt Will Perform Toileting - Clothing Manipulation and hygiene: with supervision;sit to/from stand Additional ADL Goal #1: S with RUE elbow/wrist hand AROM and precautions  Plan Discharge plan remains appropriate    Co-evaluation                 End of Session     Activity Tolerance  tolerated well   Patient Left  in bed   Nurse Communication  ready for D/C        Time: 7673-4193 OT Time Calculation (min): 24 min  Charges: OT General Charges $OT Visit: 1 Procedure OT Treatments $Self Care/Home Management : 23-37 mins  Ameliya Nicotra,HILLARY 04/21/2014, 11:49 AM   Maurie Boettcher, OTR/L  [phone] 04/21/2014

## 2014-04-24 DIAGNOSIS — E46 Unspecified protein-calorie malnutrition: Secondary | ICD-10-CM | POA: Diagnosis not present

## 2014-04-24 DIAGNOSIS — M25552 Pain in left hip: Secondary | ICD-10-CM | POA: Diagnosis not present

## 2014-04-24 DIAGNOSIS — S42201A Unspecified fracture of upper end of right humerus, initial encounter for closed fracture: Secondary | ICD-10-CM | POA: Diagnosis not present

## 2014-04-24 DIAGNOSIS — I4891 Unspecified atrial fibrillation: Secondary | ICD-10-CM | POA: Diagnosis not present

## 2014-05-01 DIAGNOSIS — I4891 Unspecified atrial fibrillation: Secondary | ICD-10-CM | POA: Diagnosis not present

## 2014-05-01 DIAGNOSIS — M25552 Pain in left hip: Secondary | ICD-10-CM | POA: Diagnosis not present

## 2014-05-01 DIAGNOSIS — S42301D Unspecified fracture of shaft of humerus, right arm, subsequent encounter for fracture with routine healing: Secondary | ICD-10-CM | POA: Diagnosis not present

## 2014-05-03 DIAGNOSIS — S42231D 3-part fracture of surgical neck of right humerus, subsequent encounter for fracture with routine healing: Secondary | ICD-10-CM | POA: Diagnosis not present

## 2014-05-03 DIAGNOSIS — Z4789 Encounter for other orthopedic aftercare: Secondary | ICD-10-CM | POA: Diagnosis not present

## 2014-05-11 DIAGNOSIS — S42309D Unspecified fracture of shaft of humerus, unspecified arm, subsequent encounter for fracture with routine healing: Secondary | ICD-10-CM | POA: Diagnosis not present

## 2014-05-11 DIAGNOSIS — R112 Nausea with vomiting, unspecified: Secondary | ICD-10-CM | POA: Diagnosis not present

## 2014-05-13 DIAGNOSIS — S42231D 3-part fracture of surgical neck of right humerus, subsequent encounter for fracture with routine healing: Secondary | ICD-10-CM | POA: Diagnosis not present

## 2014-05-16 DIAGNOSIS — R112 Nausea with vomiting, unspecified: Secondary | ICD-10-CM | POA: Diagnosis not present

## 2014-05-16 DIAGNOSIS — S42301A Unspecified fracture of shaft of humerus, right arm, initial encounter for closed fracture: Secondary | ICD-10-CM | POA: Diagnosis not present

## 2014-05-16 DIAGNOSIS — K3184 Gastroparesis: Secondary | ICD-10-CM | POA: Diagnosis not present

## 2014-05-16 DIAGNOSIS — I480 Paroxysmal atrial fibrillation: Secondary | ICD-10-CM | POA: Diagnosis not present

## 2014-05-31 DIAGNOSIS — S42231D 3-part fracture of surgical neck of right humerus, subsequent encounter for fracture with routine healing: Secondary | ICD-10-CM | POA: Diagnosis not present

## 2014-06-11 DIAGNOSIS — S4291XD Fracture of right shoulder girdle, part unspecified, subsequent encounter for fracture with routine healing: Secondary | ICD-10-CM | POA: Diagnosis not present

## 2014-06-11 DIAGNOSIS — K3184 Gastroparesis: Secondary | ICD-10-CM | POA: Diagnosis not present

## 2014-06-11 DIAGNOSIS — M5136 Other intervertebral disc degeneration, lumbar region: Secondary | ICD-10-CM | POA: Diagnosis not present

## 2014-06-11 DIAGNOSIS — B1920 Unspecified viral hepatitis C without hepatic coma: Secondary | ICD-10-CM | POA: Diagnosis not present

## 2014-06-11 DIAGNOSIS — I10 Essential (primary) hypertension: Secondary | ICD-10-CM | POA: Diagnosis not present

## 2014-06-11 DIAGNOSIS — Z8781 Personal history of (healed) traumatic fracture: Secondary | ICD-10-CM | POA: Diagnosis not present

## 2014-06-11 DIAGNOSIS — Z5181 Encounter for therapeutic drug level monitoring: Secondary | ICD-10-CM | POA: Diagnosis not present

## 2014-06-11 DIAGNOSIS — Z96643 Presence of artificial hip joint, bilateral: Secondary | ICD-10-CM | POA: Diagnosis not present

## 2014-06-11 DIAGNOSIS — Z96651 Presence of right artificial knee joint: Secondary | ICD-10-CM | POA: Diagnosis not present

## 2014-06-11 DIAGNOSIS — F329 Major depressive disorder, single episode, unspecified: Secondary | ICD-10-CM | POA: Diagnosis not present

## 2014-06-11 DIAGNOSIS — F419 Anxiety disorder, unspecified: Secondary | ICD-10-CM | POA: Diagnosis not present

## 2014-06-11 DIAGNOSIS — Z7901 Long term (current) use of anticoagulants: Secondary | ICD-10-CM | POA: Diagnosis not present

## 2014-06-11 DIAGNOSIS — M810 Age-related osteoporosis without current pathological fracture: Secondary | ICD-10-CM | POA: Diagnosis not present

## 2014-06-11 DIAGNOSIS — S42201D Unspecified fracture of upper end of right humerus, subsequent encounter for fracture with routine healing: Secondary | ICD-10-CM | POA: Diagnosis not present

## 2014-06-11 DIAGNOSIS — D329 Benign neoplasm of meninges, unspecified: Secondary | ICD-10-CM | POA: Diagnosis not present

## 2014-06-11 DIAGNOSIS — K589 Irritable bowel syndrome without diarrhea: Secondary | ICD-10-CM | POA: Diagnosis not present

## 2014-06-11 DIAGNOSIS — M1990 Unspecified osteoarthritis, unspecified site: Secondary | ICD-10-CM | POA: Diagnosis not present

## 2014-06-11 DIAGNOSIS — I480 Paroxysmal atrial fibrillation: Secondary | ICD-10-CM | POA: Diagnosis not present

## 2014-06-11 DIAGNOSIS — K219 Gastro-esophageal reflux disease without esophagitis: Secondary | ICD-10-CM | POA: Diagnosis not present

## 2014-06-11 DIAGNOSIS — R29810 Facial weakness: Secondary | ICD-10-CM | POA: Diagnosis not present

## 2014-06-11 DIAGNOSIS — D649 Anemia, unspecified: Secondary | ICD-10-CM | POA: Diagnosis not present

## 2014-06-11 DIAGNOSIS — E441 Mild protein-calorie malnutrition: Secondary | ICD-10-CM | POA: Diagnosis not present

## 2014-06-11 DIAGNOSIS — Z9181 History of falling: Secondary | ICD-10-CM | POA: Diagnosis not present

## 2014-06-13 ENCOUNTER — Other Ambulatory Visit: Payer: Self-pay

## 2014-06-13 DIAGNOSIS — Z1231 Encounter for screening mammogram for malignant neoplasm of breast: Secondary | ICD-10-CM

## 2014-06-15 DIAGNOSIS — E441 Mild protein-calorie malnutrition: Secondary | ICD-10-CM | POA: Diagnosis not present

## 2014-06-15 DIAGNOSIS — I10 Essential (primary) hypertension: Secondary | ICD-10-CM | POA: Diagnosis not present

## 2014-06-15 DIAGNOSIS — S42201D Unspecified fracture of upper end of right humerus, subsequent encounter for fracture with routine healing: Secondary | ICD-10-CM | POA: Diagnosis not present

## 2014-06-15 DIAGNOSIS — K3184 Gastroparesis: Secondary | ICD-10-CM | POA: Diagnosis not present

## 2014-06-15 DIAGNOSIS — I480 Paroxysmal atrial fibrillation: Secondary | ICD-10-CM | POA: Diagnosis not present

## 2014-06-15 DIAGNOSIS — S4291XD Fracture of right shoulder girdle, part unspecified, subsequent encounter for fracture with routine healing: Secondary | ICD-10-CM | POA: Diagnosis not present

## 2014-06-16 DIAGNOSIS — I10 Essential (primary) hypertension: Secondary | ICD-10-CM | POA: Diagnosis not present

## 2014-06-16 DIAGNOSIS — E441 Mild protein-calorie malnutrition: Secondary | ICD-10-CM | POA: Diagnosis not present

## 2014-06-16 DIAGNOSIS — S42201D Unspecified fracture of upper end of right humerus, subsequent encounter for fracture with routine healing: Secondary | ICD-10-CM | POA: Diagnosis not present

## 2014-06-16 DIAGNOSIS — S4291XD Fracture of right shoulder girdle, part unspecified, subsequent encounter for fracture with routine healing: Secondary | ICD-10-CM | POA: Diagnosis not present

## 2014-06-16 DIAGNOSIS — I480 Paroxysmal atrial fibrillation: Secondary | ICD-10-CM | POA: Diagnosis not present

## 2014-06-16 DIAGNOSIS — K3184 Gastroparesis: Secondary | ICD-10-CM | POA: Diagnosis not present

## 2014-06-21 DIAGNOSIS — S42201D Unspecified fracture of upper end of right humerus, subsequent encounter for fracture with routine healing: Secondary | ICD-10-CM | POA: Diagnosis not present

## 2014-06-21 DIAGNOSIS — I10 Essential (primary) hypertension: Secondary | ICD-10-CM | POA: Diagnosis not present

## 2014-06-21 DIAGNOSIS — K3184 Gastroparesis: Secondary | ICD-10-CM | POA: Diagnosis not present

## 2014-06-21 DIAGNOSIS — S4291XD Fracture of right shoulder girdle, part unspecified, subsequent encounter for fracture with routine healing: Secondary | ICD-10-CM | POA: Diagnosis not present

## 2014-06-21 DIAGNOSIS — I480 Paroxysmal atrial fibrillation: Secondary | ICD-10-CM | POA: Diagnosis not present

## 2014-06-21 DIAGNOSIS — E441 Mild protein-calorie malnutrition: Secondary | ICD-10-CM | POA: Diagnosis not present

## 2014-06-22 DIAGNOSIS — I10 Essential (primary) hypertension: Secondary | ICD-10-CM | POA: Diagnosis not present

## 2014-06-22 DIAGNOSIS — I480 Paroxysmal atrial fibrillation: Secondary | ICD-10-CM | POA: Diagnosis not present

## 2014-06-22 DIAGNOSIS — S42201D Unspecified fracture of upper end of right humerus, subsequent encounter for fracture with routine healing: Secondary | ICD-10-CM | POA: Diagnosis not present

## 2014-06-22 DIAGNOSIS — K3184 Gastroparesis: Secondary | ICD-10-CM | POA: Diagnosis not present

## 2014-06-22 DIAGNOSIS — E441 Mild protein-calorie malnutrition: Secondary | ICD-10-CM | POA: Diagnosis not present

## 2014-06-22 DIAGNOSIS — S4291XD Fracture of right shoulder girdle, part unspecified, subsequent encounter for fracture with routine healing: Secondary | ICD-10-CM | POA: Diagnosis not present

## 2014-06-23 ENCOUNTER — Other Ambulatory Visit: Payer: Self-pay | Admitting: Cardiology

## 2014-06-23 DIAGNOSIS — S42201D Unspecified fracture of upper end of right humerus, subsequent encounter for fracture with routine healing: Secondary | ICD-10-CM | POA: Diagnosis not present

## 2014-06-23 DIAGNOSIS — E441 Mild protein-calorie malnutrition: Secondary | ICD-10-CM | POA: Diagnosis not present

## 2014-06-23 DIAGNOSIS — I480 Paroxysmal atrial fibrillation: Secondary | ICD-10-CM | POA: Diagnosis not present

## 2014-06-23 DIAGNOSIS — I10 Essential (primary) hypertension: Secondary | ICD-10-CM | POA: Diagnosis not present

## 2014-06-23 DIAGNOSIS — S4291XD Fracture of right shoulder girdle, part unspecified, subsequent encounter for fracture with routine healing: Secondary | ICD-10-CM | POA: Diagnosis not present

## 2014-06-23 DIAGNOSIS — K3184 Gastroparesis: Secondary | ICD-10-CM | POA: Diagnosis not present

## 2014-06-23 MED ORDER — HYDROCHLOROTHIAZIDE 25 MG PO TABS: 25.0000 mg | ORAL_TABLET | Freq: Every day | ORAL | Status: DC

## 2014-06-23 NOTE — Telephone Encounter (Signed)
Rx(s) sent to pharmacy electronically. Patient notified. 

## 2014-06-23 NOTE — Telephone Encounter (Signed)
°  1. Which medications need to be refilled? Hydrochlorothiazide 25mg    2. Which pharmacy is medication to be sent to?[facility] on [address]    3. Do they need a 30 day or 90 day supply? 90  4. Would they like a call back once the medication has been sent to the pharmacy? Yes

## 2014-06-27 DIAGNOSIS — E441 Mild protein-calorie malnutrition: Secondary | ICD-10-CM | POA: Diagnosis not present

## 2014-06-27 DIAGNOSIS — S4291XD Fracture of right shoulder girdle, part unspecified, subsequent encounter for fracture with routine healing: Secondary | ICD-10-CM | POA: Diagnosis not present

## 2014-06-27 DIAGNOSIS — K3184 Gastroparesis: Secondary | ICD-10-CM | POA: Diagnosis not present

## 2014-06-27 DIAGNOSIS — I10 Essential (primary) hypertension: Secondary | ICD-10-CM | POA: Diagnosis not present

## 2014-06-27 DIAGNOSIS — I480 Paroxysmal atrial fibrillation: Secondary | ICD-10-CM | POA: Diagnosis not present

## 2014-06-27 DIAGNOSIS — S42201D Unspecified fracture of upper end of right humerus, subsequent encounter for fracture with routine healing: Secondary | ICD-10-CM | POA: Diagnosis not present

## 2014-06-29 DIAGNOSIS — E441 Mild protein-calorie malnutrition: Secondary | ICD-10-CM | POA: Diagnosis not present

## 2014-06-29 DIAGNOSIS — S42201D Unspecified fracture of upper end of right humerus, subsequent encounter for fracture with routine healing: Secondary | ICD-10-CM | POA: Diagnosis not present

## 2014-06-29 DIAGNOSIS — K3184 Gastroparesis: Secondary | ICD-10-CM | POA: Diagnosis not present

## 2014-06-29 DIAGNOSIS — I10 Essential (primary) hypertension: Secondary | ICD-10-CM | POA: Diagnosis not present

## 2014-06-29 DIAGNOSIS — I480 Paroxysmal atrial fibrillation: Secondary | ICD-10-CM | POA: Diagnosis not present

## 2014-06-29 DIAGNOSIS — S4291XD Fracture of right shoulder girdle, part unspecified, subsequent encounter for fracture with routine healing: Secondary | ICD-10-CM | POA: Diagnosis not present

## 2014-06-30 DIAGNOSIS — S42231D 3-part fracture of surgical neck of right humerus, subsequent encounter for fracture with routine healing: Secondary | ICD-10-CM | POA: Diagnosis not present

## 2014-06-30 DIAGNOSIS — Z4789 Encounter for other orthopedic aftercare: Secondary | ICD-10-CM | POA: Diagnosis not present

## 2014-07-01 DIAGNOSIS — I10 Essential (primary) hypertension: Secondary | ICD-10-CM | POA: Diagnosis not present

## 2014-07-01 DIAGNOSIS — I480 Paroxysmal atrial fibrillation: Secondary | ICD-10-CM | POA: Diagnosis not present

## 2014-07-01 DIAGNOSIS — K3184 Gastroparesis: Secondary | ICD-10-CM | POA: Diagnosis not present

## 2014-07-01 DIAGNOSIS — S42201D Unspecified fracture of upper end of right humerus, subsequent encounter for fracture with routine healing: Secondary | ICD-10-CM | POA: Diagnosis not present

## 2014-07-01 DIAGNOSIS — S4291XD Fracture of right shoulder girdle, part unspecified, subsequent encounter for fracture with routine healing: Secondary | ICD-10-CM | POA: Diagnosis not present

## 2014-07-01 DIAGNOSIS — E441 Mild protein-calorie malnutrition: Secondary | ICD-10-CM | POA: Diagnosis not present

## 2014-07-06 DIAGNOSIS — K3184 Gastroparesis: Secondary | ICD-10-CM | POA: Diagnosis not present

## 2014-07-06 DIAGNOSIS — I480 Paroxysmal atrial fibrillation: Secondary | ICD-10-CM | POA: Diagnosis not present

## 2014-07-06 DIAGNOSIS — S4291XD Fracture of right shoulder girdle, part unspecified, subsequent encounter for fracture with routine healing: Secondary | ICD-10-CM | POA: Diagnosis not present

## 2014-07-06 DIAGNOSIS — E441 Mild protein-calorie malnutrition: Secondary | ICD-10-CM | POA: Diagnosis not present

## 2014-07-06 DIAGNOSIS — I10 Essential (primary) hypertension: Secondary | ICD-10-CM | POA: Diagnosis not present

## 2014-07-06 DIAGNOSIS — S42201D Unspecified fracture of upper end of right humerus, subsequent encounter for fracture with routine healing: Secondary | ICD-10-CM | POA: Diagnosis not present

## 2014-07-08 DIAGNOSIS — K3184 Gastroparesis: Secondary | ICD-10-CM | POA: Diagnosis not present

## 2014-07-08 DIAGNOSIS — S4291XD Fracture of right shoulder girdle, part unspecified, subsequent encounter for fracture with routine healing: Secondary | ICD-10-CM | POA: Diagnosis not present

## 2014-07-08 DIAGNOSIS — E441 Mild protein-calorie malnutrition: Secondary | ICD-10-CM | POA: Diagnosis not present

## 2014-07-08 DIAGNOSIS — S42201D Unspecified fracture of upper end of right humerus, subsequent encounter for fracture with routine healing: Secondary | ICD-10-CM | POA: Diagnosis not present

## 2014-07-08 DIAGNOSIS — I10 Essential (primary) hypertension: Secondary | ICD-10-CM | POA: Diagnosis not present

## 2014-07-08 DIAGNOSIS — I480 Paroxysmal atrial fibrillation: Secondary | ICD-10-CM | POA: Diagnosis not present

## 2014-07-14 DIAGNOSIS — I480 Paroxysmal atrial fibrillation: Secondary | ICD-10-CM | POA: Diagnosis not present

## 2014-07-14 DIAGNOSIS — E441 Mild protein-calorie malnutrition: Secondary | ICD-10-CM | POA: Diagnosis not present

## 2014-07-14 DIAGNOSIS — S42231D 3-part fracture of surgical neck of right humerus, subsequent encounter for fracture with routine healing: Secondary | ICD-10-CM | POA: Diagnosis not present

## 2014-07-14 DIAGNOSIS — K3184 Gastroparesis: Secondary | ICD-10-CM | POA: Diagnosis not present

## 2014-07-14 DIAGNOSIS — S4291XD Fracture of right shoulder girdle, part unspecified, subsequent encounter for fracture with routine healing: Secondary | ICD-10-CM | POA: Diagnosis not present

## 2014-07-14 DIAGNOSIS — I10 Essential (primary) hypertension: Secondary | ICD-10-CM | POA: Diagnosis not present

## 2014-07-14 DIAGNOSIS — S42201D Unspecified fracture of upper end of right humerus, subsequent encounter for fracture with routine healing: Secondary | ICD-10-CM | POA: Diagnosis not present

## 2014-07-18 DIAGNOSIS — H3531 Nonexudative age-related macular degeneration: Secondary | ICD-10-CM | POA: Diagnosis not present

## 2014-07-18 DIAGNOSIS — S42231D 3-part fracture of surgical neck of right humerus, subsequent encounter for fracture with routine healing: Secondary | ICD-10-CM | POA: Diagnosis not present

## 2014-07-18 DIAGNOSIS — Z961 Presence of intraocular lens: Secondary | ICD-10-CM | POA: Diagnosis not present

## 2014-07-18 DIAGNOSIS — H43813 Vitreous degeneration, bilateral: Secondary | ICD-10-CM | POA: Diagnosis not present

## 2014-07-18 DIAGNOSIS — H26493 Other secondary cataract, bilateral: Secondary | ICD-10-CM | POA: Diagnosis not present

## 2014-07-21 DIAGNOSIS — S42231D 3-part fracture of surgical neck of right humerus, subsequent encounter for fracture with routine healing: Secondary | ICD-10-CM | POA: Diagnosis not present

## 2014-07-22 DIAGNOSIS — I480 Paroxysmal atrial fibrillation: Secondary | ICD-10-CM | POA: Diagnosis not present

## 2014-07-22 DIAGNOSIS — Z7901 Long term (current) use of anticoagulants: Secondary | ICD-10-CM | POA: Diagnosis not present

## 2014-07-25 DIAGNOSIS — S42231D 3-part fracture of surgical neck of right humerus, subsequent encounter for fracture with routine healing: Secondary | ICD-10-CM | POA: Diagnosis not present

## 2014-07-28 DIAGNOSIS — S42231D 3-part fracture of surgical neck of right humerus, subsequent encounter for fracture with routine healing: Secondary | ICD-10-CM | POA: Diagnosis not present

## 2014-08-01 DIAGNOSIS — S42231D 3-part fracture of surgical neck of right humerus, subsequent encounter for fracture with routine healing: Secondary | ICD-10-CM | POA: Diagnosis not present

## 2014-08-03 DIAGNOSIS — R21 Rash and other nonspecific skin eruption: Secondary | ICD-10-CM | POA: Diagnosis not present

## 2014-08-03 DIAGNOSIS — Z6824 Body mass index (BMI) 24.0-24.9, adult: Secondary | ICD-10-CM | POA: Diagnosis not present

## 2014-08-03 DIAGNOSIS — I10 Essential (primary) hypertension: Secondary | ICD-10-CM | POA: Diagnosis not present

## 2014-08-03 DIAGNOSIS — M25511 Pain in right shoulder: Secondary | ICD-10-CM | POA: Diagnosis not present

## 2014-08-03 DIAGNOSIS — I480 Paroxysmal atrial fibrillation: Secondary | ICD-10-CM | POA: Diagnosis not present

## 2014-08-03 DIAGNOSIS — Z1389 Encounter for screening for other disorder: Secondary | ICD-10-CM | POA: Diagnosis not present

## 2014-08-04 ENCOUNTER — Ambulatory Visit
Admission: RE | Admit: 2014-08-04 | Discharge: 2014-08-04 | Disposition: A | Discharge status: Home / Self Care | Payer: Medicare Other | Source: Ambulatory Visit

## 2014-08-04 DIAGNOSIS — Z1231 Encounter for screening mammogram for malignant neoplasm of breast: Secondary | ICD-10-CM

## 2014-08-04 DIAGNOSIS — S42231D 3-part fracture of surgical neck of right humerus, subsequent encounter for fracture with routine healing: Secondary | ICD-10-CM | POA: Diagnosis not present

## 2014-08-08 DIAGNOSIS — S42231D 3-part fracture of surgical neck of right humerus, subsequent encounter for fracture with routine healing: Secondary | ICD-10-CM | POA: Diagnosis not present

## 2014-08-11 DIAGNOSIS — Z4789 Encounter for other orthopedic aftercare: Secondary | ICD-10-CM | POA: Diagnosis not present

## 2014-08-11 DIAGNOSIS — S42231D 3-part fracture of surgical neck of right humerus, subsequent encounter for fracture with routine healing: Secondary | ICD-10-CM | POA: Diagnosis not present

## 2014-08-16 DIAGNOSIS — H938X2 Other specified disorders of left ear: Secondary | ICD-10-CM | POA: Diagnosis not present

## 2014-08-16 DIAGNOSIS — H6120 Impacted cerumen, unspecified ear: Secondary | ICD-10-CM | POA: Diagnosis not present

## 2014-08-19 DIAGNOSIS — I480 Paroxysmal atrial fibrillation: Secondary | ICD-10-CM | POA: Diagnosis not present

## 2014-08-19 DIAGNOSIS — B372 Candidiasis of skin and nail: Secondary | ICD-10-CM | POA: Diagnosis not present

## 2014-08-19 DIAGNOSIS — Z7901 Long term (current) use of anticoagulants: Secondary | ICD-10-CM | POA: Diagnosis not present

## 2014-09-09 DIAGNOSIS — Z7901 Long term (current) use of anticoagulants: Secondary | ICD-10-CM | POA: Diagnosis not present

## 2014-09-09 DIAGNOSIS — B372 Candidiasis of skin and nail: Secondary | ICD-10-CM | POA: Diagnosis not present

## 2014-09-09 DIAGNOSIS — I480 Paroxysmal atrial fibrillation: Secondary | ICD-10-CM | POA: Diagnosis not present

## 2014-09-13 ENCOUNTER — Encounter: Payer: Self-pay | Admitting: Cardiology

## 2014-09-13 ENCOUNTER — Ambulatory Visit (INDEPENDENT_AMBULATORY_CARE_PROVIDER_SITE_OTHER): Payer: Medicare Other | Admitting: Cardiology

## 2014-09-13 VITALS — BP 140/78 | HR 50 | Ht 66.0 in | Wt 146.6 lb

## 2014-09-13 DIAGNOSIS — I480 Paroxysmal atrial fibrillation: Secondary | ICD-10-CM | POA: Diagnosis not present

## 2014-09-13 DIAGNOSIS — I10 Essential (primary) hypertension: Secondary | ICD-10-CM | POA: Diagnosis not present

## 2014-09-13 DIAGNOSIS — Z7901 Long term (current) use of anticoagulants: Secondary | ICD-10-CM

## 2014-09-13 NOTE — Progress Notes (Signed)
[name] Date of Birth: [birth year]   History of Present Illness: Gloria [name] seen today for followup paroxysmal afib. She has a history of atrial fibrillation controlled with flecainide. She is on chronic anticoagulation with coumadin. She tried Xarelto in the past but this upset her stomach.  She fell in April and fractured her right humerus and had ORIF. She still complaints of shoulder pain. She denies any symptoms of palpitations. She denies any chest pain or SOB.  Myoview study in 6/14 was normal. She reports INRs have been therapeutic.   Current Outpatient Prescriptions on File Prior to Visit  Medication Sig Dispense Refill  . Calcium Carb-Cholecalciferol (CALCIUM 600 + D PO) Take 1 tablet by mouth 2 (two) times daily.    . flecainide (TAMBOCOR) 50 MG tablet Take 1 tablet (50 mg total) by mouth 2 (two) times daily. 180 tablet 1  . hydrochlorothiazide (HYDRODIURIL) 25 MG tablet Take 1 tablet (25 mg total) by mouth daily. 90 tablet 2  . Multiple Vitamins-Minerals (ICAPS MV PO) Take 1 capsule by mouth 2 (two) times daily.     Marland Kitchen omeprazole (PRILOSEC) 20 MG capsule Take 20 mg by mouth every morning.     Vladimir Faster Glycol-Propyl Glycol (SYSTANE ULTRA OP) Apply 1 drop to eye as needed (dry eyes).    . vitamin B-12 (CYANOCOBALAMIN) 1000 MCG tablet Take 1,000 mcg by mouth daily.     No current facility-administered medications on file prior to visit.    Allergies  Allergen Reactions  . Cephalexin Other (See Comments)    unknown  . Codeine Other (See Comments)    unknown  . Metoclopramide Hcl Other (See Comments)    unknown    Past Medical History  Diagnosis Date  . Hypertension   . Hyperlipidemia   . GERD (gastroesophageal reflux disease)     hx of  . Arthritis     right  . Atrial fibrillation   . Irritable bowel syndrome   . Meningioma   . Hepatitis C   . PONV (postoperative nausea and vomiting)   . Humerus fracture     Past Surgical History  Procedure  Laterality Date  . Total hip arthroplasty  11/07    left  . Parotid gland tumor excision    . Total knee arthroplasty    . Transthoracic echocardiogram  10/25/2009    EF 55-60%  . Cardiovascular stress test  11/14/2006    EF 67%  . Orif periprosthetic fracture Left 04/30/2013    Procedure: OPEN REDUCTION INTERNAL FIXATION (ORIF) PERIPROSTHETIC FRACTURE;  Surgeon: [name], MD;  Location: [facility];  Service: Orthopedics;  Laterality: Left;  . Colonoscopy w/ biopsies and polypectomy    . Breast surgery      lumpectomy  . Orif humerus fracture Right 04/18/2014    Procedure: OPEN REDUCTION INTERNAL FIXATION (ORIF) RIGHT PROXIMAL HUMERUS FRACTURE;  Surgeon: Netta Cedars, MD;  Location: [facility];  Service: Orthopedics;  Laterality: Right;    History  Smoking status  . Former Smoker -- 0.30 packs/day for 15 years  . Types: Cigarettes  . Quit date: 06/07/1980  Smokeless tobacco  . Never Used    History  Alcohol Use No    Family History  Problem Relation Age of Onset  . Heart disease Brother     Review of Systems: As noted in history of present illness.  All other systems were reviewed and are negative.  Physical Exam: BP 140/78 mmHg  Pulse 50  Ht  5\' 6"  (1.676 m)  Wt 66.497 kg (146 lb 9.6 oz)  BMI 23.67 kg/m2 She is a pleasant white female in no acute distress. Her HEENT exam is unremarkable. She has no JVD or bruits. Lungs are clear. Cardiac exam reveals a regular rate and rhythm with a soft 1/6 systolic ejection murmur at right upper sternal border. Abdomen is soft and nontender without masses. She has no edema. Pedal pulses are good.  LABORATORY DATA:  Ecg today shows NSR with LVH, LAD. I have personally reviewed and interpreted this study.   Assessment / Plan: 1. Atrial fibrillation- paroxysmal. This is well controlled on flecainide. We will continue her current therapy.   2. Chronic anticoagulation, therapeutic. Continue Coumadin.  3. Hypertension, well  controlled.

## 2014-09-13 NOTE — Patient Instructions (Signed)
Continue your current therapy  I will see you in 6 months.   

## 2014-10-11 DIAGNOSIS — Z7901 Long term (current) use of anticoagulants: Secondary | ICD-10-CM | POA: Diagnosis not present

## 2014-10-11 DIAGNOSIS — I480 Paroxysmal atrial fibrillation: Secondary | ICD-10-CM | POA: Diagnosis not present

## 2014-10-11 DIAGNOSIS — Z23 Encounter for immunization: Secondary | ICD-10-CM | POA: Diagnosis not present

## 2014-11-11 DIAGNOSIS — I480 Paroxysmal atrial fibrillation: Secondary | ICD-10-CM | POA: Diagnosis not present

## 2014-11-11 DIAGNOSIS — Z7901 Long term (current) use of anticoagulants: Secondary | ICD-10-CM | POA: Diagnosis not present

## 2014-12-06 DIAGNOSIS — M79675 Pain in left toe(s): Secondary | ICD-10-CM | POA: Diagnosis not present

## 2014-12-06 DIAGNOSIS — M7672 Peroneal tendinitis, left leg: Secondary | ICD-10-CM | POA: Diagnosis not present

## 2014-12-06 DIAGNOSIS — M25572 Pain in left ankle and joints of left foot: Secondary | ICD-10-CM | POA: Diagnosis not present

## 2014-12-06 DIAGNOSIS — M19072 Primary osteoarthritis, left ankle and foot: Secondary | ICD-10-CM | POA: Diagnosis not present

## 2014-12-16 DIAGNOSIS — I480 Paroxysmal atrial fibrillation: Secondary | ICD-10-CM | POA: Diagnosis not present

## 2014-12-16 DIAGNOSIS — Z7901 Long term (current) use of anticoagulants: Secondary | ICD-10-CM | POA: Diagnosis not present

## 2015-01-03 DIAGNOSIS — M19072 Primary osteoarthritis, left ankle and foot: Secondary | ICD-10-CM | POA: Diagnosis not present

## 2015-01-03 DIAGNOSIS — M85879 Other specified disorders of bone density and structure, unspecified ankle and foot: Secondary | ICD-10-CM | POA: Diagnosis not present

## 2015-01-03 DIAGNOSIS — M7672 Peroneal tendinitis, left leg: Secondary | ICD-10-CM | POA: Diagnosis not present

## 2015-01-03 DIAGNOSIS — M25572 Pain in left ankle and joints of left foot: Secondary | ICD-10-CM | POA: Diagnosis not present

## 2015-01-13 DIAGNOSIS — Z7901 Long term (current) use of anticoagulants: Secondary | ICD-10-CM | POA: Diagnosis not present

## 2015-01-13 DIAGNOSIS — I480 Paroxysmal atrial fibrillation: Secondary | ICD-10-CM | POA: Diagnosis not present

## 2015-02-17 DIAGNOSIS — I480 Paroxysmal atrial fibrillation: Secondary | ICD-10-CM | POA: Diagnosis not present

## 2015-02-17 DIAGNOSIS — Z7901 Long term (current) use of anticoagulants: Secondary | ICD-10-CM | POA: Diagnosis not present

## 2015-02-21 DIAGNOSIS — S42231D 3-part fracture of surgical neck of right humerus, subsequent encounter for fracture with routine healing: Secondary | ICD-10-CM | POA: Diagnosis not present

## 2015-03-07 DIAGNOSIS — I10 Essential (primary) hypertension: Secondary | ICD-10-CM | POA: Diagnosis not present

## 2015-03-07 DIAGNOSIS — E784 Other hyperlipidemia: Secondary | ICD-10-CM | POA: Diagnosis not present

## 2015-03-07 DIAGNOSIS — N390 Urinary tract infection, site not specified: Secondary | ICD-10-CM | POA: Diagnosis not present

## 2015-03-07 DIAGNOSIS — M859 Disorder of bone density and structure, unspecified: Secondary | ICD-10-CM | POA: Diagnosis not present

## 2015-03-07 DIAGNOSIS — R8299 Other abnormal findings in urine: Secondary | ICD-10-CM | POA: Diagnosis not present

## 2015-03-14 DIAGNOSIS — I480 Paroxysmal atrial fibrillation: Secondary | ICD-10-CM | POA: Diagnosis not present

## 2015-03-14 DIAGNOSIS — I10 Essential (primary) hypertension: Secondary | ICD-10-CM | POA: Diagnosis not present

## 2015-03-14 DIAGNOSIS — K3184 Gastroparesis: Secondary | ICD-10-CM | POA: Diagnosis not present

## 2015-03-14 DIAGNOSIS — M859 Disorder of bone density and structure, unspecified: Secondary | ICD-10-CM | POA: Diagnosis not present

## 2015-03-14 DIAGNOSIS — Z7901 Long term (current) use of anticoagulants: Secondary | ICD-10-CM | POA: Diagnosis not present

## 2015-03-14 DIAGNOSIS — Z1389 Encounter for screening for other disorder: Secondary | ICD-10-CM | POA: Diagnosis not present

## 2015-03-14 DIAGNOSIS — Z0000 Encounter for general adult medical examination without abnormal findings: Secondary | ICD-10-CM | POA: Diagnosis not present

## 2015-03-14 DIAGNOSIS — Z6825 Body mass index (BMI) 25.0-25.9, adult: Secondary | ICD-10-CM | POA: Diagnosis not present

## 2015-03-14 DIAGNOSIS — E784 Other hyperlipidemia: Secondary | ICD-10-CM | POA: Diagnosis not present

## 2015-03-16 ENCOUNTER — Ambulatory Visit: Payer: Medicare Other | Admitting: Cardiology

## 2015-03-17 DIAGNOSIS — Z7901 Long term (current) use of anticoagulants: Secondary | ICD-10-CM | POA: Diagnosis not present

## 2015-03-17 DIAGNOSIS — I480 Paroxysmal atrial fibrillation: Secondary | ICD-10-CM | POA: Diagnosis not present

## 2015-03-23 ENCOUNTER — Other Ambulatory Visit: Payer: Self-pay | Admitting: Cardiology

## 2015-03-23 NOTE — Telephone Encounter (Signed)
Rx request sent to pharmacy.  

## 2015-04-14 DIAGNOSIS — I480 Paroxysmal atrial fibrillation: Secondary | ICD-10-CM | POA: Diagnosis not present

## 2015-04-14 DIAGNOSIS — Z7901 Long term (current) use of anticoagulants: Secondary | ICD-10-CM | POA: Diagnosis not present

## 2015-04-28 DIAGNOSIS — I480 Paroxysmal atrial fibrillation: Secondary | ICD-10-CM | POA: Diagnosis not present

## 2015-04-28 DIAGNOSIS — Z7901 Long term (current) use of anticoagulants: Secondary | ICD-10-CM | POA: Diagnosis not present

## 2015-05-26 DIAGNOSIS — Z7901 Long term (current) use of anticoagulants: Secondary | ICD-10-CM | POA: Diagnosis not present

## 2015-05-26 DIAGNOSIS — I480 Paroxysmal atrial fibrillation: Secondary | ICD-10-CM | POA: Diagnosis not present

## 2015-06-14 DIAGNOSIS — K5900 Constipation, unspecified: Secondary | ICD-10-CM | POA: Diagnosis not present

## 2015-06-16 DIAGNOSIS — I480 Paroxysmal atrial fibrillation: Secondary | ICD-10-CM | POA: Diagnosis not present

## 2015-06-16 DIAGNOSIS — Z7901 Long term (current) use of anticoagulants: Secondary | ICD-10-CM | POA: Diagnosis not present

## 2015-06-21 ENCOUNTER — Ambulatory Visit (INDEPENDENT_AMBULATORY_CARE_PROVIDER_SITE_OTHER): Payer: Medicare Other | Admitting: Cardiology

## 2015-06-21 ENCOUNTER — Encounter: Payer: Self-pay | Admitting: Cardiology

## 2015-06-21 VITALS — BP 132/64 | HR 76 | Ht 65.0 in | Wt 148.0 lb

## 2015-06-21 DIAGNOSIS — E785 Hyperlipidemia, unspecified: Secondary | ICD-10-CM

## 2015-06-21 DIAGNOSIS — Z7901 Long term (current) use of anticoagulants: Secondary | ICD-10-CM | POA: Diagnosis not present

## 2015-06-21 DIAGNOSIS — I10 Essential (primary) hypertension: Secondary | ICD-10-CM

## 2015-06-21 DIAGNOSIS — I480 Paroxysmal atrial fibrillation: Secondary | ICD-10-CM

## 2015-06-21 NOTE — Progress Notes (Signed)
Gloria [name] Date of Birth: August 15, 1930   History of Present Illness: Gloria [name] seen today for followup paroxysmal afib. She has a history of atrial fibrillation controlled with flecainide. She is on chronic anticoagulation with coumadin. She tried Xarelto in the past but this upset her stomach.   She denies any symptoms of palpitations. She denies any chest pain or SOB.  Myoview study in 6/14 was normal. She reports INRs have been therapeutic.   Current Outpatient Prescriptions on File Prior to Visit  Medication Sig Dispense Refill  . Calcium Carb-Cholecalciferol (CALCIUM 600 + D PO) Take 1 tablet by mouth 2 (two) times daily.    . clotrimazole (LOTRIMIN) 1 % cream Apply 1 application topically as needed.    . flecainide (TAMBOCOR) 50 MG tablet Take 1 tablet (50 mg total) by mouth 2 (two) times daily. 180 tablet 1  . hydrochlorothiazide (HYDRODIURIL) 25 MG tablet Take 1 tablet (25 mg total) by mouth daily. Please schedule appointment for refills. 90 tablet 0  . Multiple Vitamins-Minerals (ICAPS MV PO) Take 1 capsule by mouth 2 (two) times daily.     Marland Kitchen omeprazole (PRILOSEC) 20 MG capsule Take 20 mg by mouth every morning.     Vladimir Faster Glycol-Propyl Glycol (SYSTANE ULTRA OP) Apply 1 drop to eye as needed (dry eyes).    . vitamin B-12 (CYANOCOBALAMIN) 1000 MCG tablet Take 1,000 mcg by mouth daily.    Marland Kitchen warfarin (COUMADIN) 6 MG tablet Take 1 tablet by mouth daily.     No current facility-administered medications on file prior to visit.    Allergies  Allergen Reactions  . Cephalexin Other (See Comments)    unknown  . Codeine Other (See Comments)    unknown  . Metoclopramide Hcl Other (See Comments)    unknown    Past Medical History  Diagnosis Date  . Hypertension   . Hyperlipidemia   . GERD (gastroesophageal reflux disease)     hx of  . Arthritis     right  . Atrial fibrillation ([place])   . Irritable bowel syndrome   . Meningioma ([place])   . Hepatitis C   . PONV  (postoperative nausea and vomiting)   . Humerus fracture     Past Surgical History  Procedure Laterality Date  . Total hip arthroplasty  11/07    left  . Parotid gland tumor excision    . Total knee arthroplasty    . Transthoracic echocardiogram  10/25/2009    EF 55-60%  . Cardiovascular stress test  11/14/2006    EF 67%  . Orif periprosthetic fracture Left 04/30/2013    Procedure: OPEN REDUCTION INTERNAL FIXATION (ORIF) PERIPROSTHETIC FRACTURE;  Surgeon: [name], MD;  Location: [facility];  Service: Orthopedics;  Laterality: Left;  . Colonoscopy w/ biopsies and polypectomy    . Breast surgery      lumpectomy  . Orif humerus fracture Right 04/18/2014    Procedure: OPEN REDUCTION INTERNAL FIXATION (ORIF) RIGHT PROXIMAL HUMERUS FRACTURE;  Surgeon: Netta Cedars, MD;  Location: [facility];  Service: Orthopedics;  Laterality: Right;    History  Smoking status  . Former Smoker -- 0.30 packs/day for 15 years  . Types: Cigarettes  . Quit date: 06/07/1980  Smokeless tobacco  . Never Used    History  Alcohol Use No    Family History  Problem Relation Age of Onset  . Heart disease Brother     Review of Systems: As noted in history of present illness.  All other systems were reviewed and are negative.  Physical Exam: BP 132/64 mmHg  Pulse 76  Ht 5\' 5"  (1.651 m)  Wt 148 lb (67.132 kg)  BMI 24.63 kg/m2 She is a pleasant white female in no acute distress. Her HEENT exam is unremarkable. She has no JVD or bruits. Lungs are clear. Cardiac exam reveals a regular rate and rhythm with a soft 1/6 systolic ejection murmur at right upper sternal border. Abdomen is soft and nontender without masses. She has no edema. Pedal pulses are good.  LABORATORY DATA:     Assessment / Plan: 1. Atrial fibrillation- paroxysmal. This is well controlled on low dose flecainide. We will continue her current therapy.   2. Chronic anticoagulation, therapeutic. Continue Coumadin.  3. Hypertension, well  controlled.  I will follow up in 6 months with Ecg.

## 2015-06-21 NOTE — Patient Instructions (Addendum)
Continue your current therapy  I will seen you in 6 months

## 2015-06-26 ENCOUNTER — Other Ambulatory Visit: Payer: Self-pay | Admitting: Cardiology

## 2015-06-27 ENCOUNTER — Other Ambulatory Visit: Payer: Self-pay | Admitting: *Deleted

## 2015-06-27 MED ORDER — HYDROCHLOROTHIAZIDE 25 MG PO TABS: 25.0000 mg | ORAL_TABLET | Freq: Every day | ORAL | Status: DC

## 2015-07-07 DIAGNOSIS — Z7901 Long term (current) use of anticoagulants: Secondary | ICD-10-CM | POA: Diagnosis not present

## 2015-07-07 DIAGNOSIS — I480 Paroxysmal atrial fibrillation: Secondary | ICD-10-CM | POA: Diagnosis not present

## 2015-07-18 ENCOUNTER — Other Ambulatory Visit: Payer: Self-pay | Admitting: Internal Medicine

## 2015-07-18 DIAGNOSIS — Z1231 Encounter for screening mammogram for malignant neoplasm of breast: Secondary | ICD-10-CM

## 2015-07-20 DIAGNOSIS — Z0100 Encounter for examination of eyes and vision without abnormal findings: Secondary | ICD-10-CM | POA: Diagnosis not present

## 2015-07-20 DIAGNOSIS — H353131 Nonexudative age-related macular degeneration, bilateral, early dry stage: Secondary | ICD-10-CM | POA: Diagnosis not present

## 2015-07-20 DIAGNOSIS — H26492 Other secondary cataract, left eye: Secondary | ICD-10-CM | POA: Diagnosis not present

## 2015-07-20 DIAGNOSIS — H04123 Dry eye syndrome of bilateral lacrimal glands: Secondary | ICD-10-CM | POA: Diagnosis not present

## 2015-08-07 DIAGNOSIS — Z7901 Long term (current) use of anticoagulants: Secondary | ICD-10-CM | POA: Diagnosis not present

## 2015-08-07 DIAGNOSIS — I480 Paroxysmal atrial fibrillation: Secondary | ICD-10-CM | POA: Diagnosis not present

## 2015-08-09 ENCOUNTER — Ambulatory Visit
Admission: RE | Admit: 2015-08-09 | Discharge: 2015-08-09 | Disposition: A | Discharge status: Home / Self Care | Payer: Medicare Other | Source: Ambulatory Visit | Attending: Internal Medicine | Admitting: Internal Medicine

## 2015-08-09 DIAGNOSIS — Z1231 Encounter for screening mammogram for malignant neoplasm of breast: Secondary | ICD-10-CM | POA: Diagnosis not present

## 2015-08-17 DIAGNOSIS — H538 Other visual disturbances: Secondary | ICD-10-CM | POA: Diagnosis not present

## 2015-08-17 DIAGNOSIS — H26492 Other secondary cataract, left eye: Secondary | ICD-10-CM | POA: Diagnosis not present

## 2015-09-08 DIAGNOSIS — Z7901 Long term (current) use of anticoagulants: Secondary | ICD-10-CM | POA: Diagnosis not present

## 2015-09-08 DIAGNOSIS — I480 Paroxysmal atrial fibrillation: Secondary | ICD-10-CM | POA: Diagnosis not present

## 2015-09-25 DIAGNOSIS — Z23 Encounter for immunization: Secondary | ICD-10-CM | POA: Diagnosis not present

## 2015-10-09 DIAGNOSIS — Z7901 Long term (current) use of anticoagulants: Secondary | ICD-10-CM | POA: Diagnosis not present

## 2015-10-09 DIAGNOSIS — I480 Paroxysmal atrial fibrillation: Secondary | ICD-10-CM | POA: Diagnosis not present

## 2015-11-14 DIAGNOSIS — H6123 Impacted cerumen, bilateral: Secondary | ICD-10-CM | POA: Diagnosis not present

## 2015-11-17 DIAGNOSIS — Z7901 Long term (current) use of anticoagulants: Secondary | ICD-10-CM | POA: Diagnosis not present

## 2015-11-17 DIAGNOSIS — I480 Paroxysmal atrial fibrillation: Secondary | ICD-10-CM | POA: Diagnosis not present

## 2015-12-15 DIAGNOSIS — Z7901 Long term (current) use of anticoagulants: Secondary | ICD-10-CM | POA: Diagnosis not present

## 2015-12-15 DIAGNOSIS — I480 Paroxysmal atrial fibrillation: Secondary | ICD-10-CM | POA: Diagnosis not present

## 2015-12-25 NOTE — Progress Notes (Signed)
Gloria [name] Date of Birth: [birth year]   History of Present Illness: Gloria [name] seen today for followup paroxysmal afib. Gloria [name] has a history of atrial fibrillation controlled with flecainide. Gloria [name] is on chronic anticoagulation with coumadin. Gloria [name] tried Xarelto in the past but this upset her stomach.   Gloria [name] denies any symptoms of palpitations. Gloria [name] denies any chest pain or SOB.  Myoview study in 6/14 was normal. Gloria [name] reports INRs have been therapeutic. Gloria [name] did lose her husband in October and is grieving.   Current Outpatient Prescriptions on File Prior to Visit  Medication Sig Dispense Refill  . Calcium Carb-Cholecalciferol (CALCIUM 600 + D PO) Take 1 tablet by mouth 2 (two) times daily.    . flecainide (TAMBOCOR) 50 MG tablet Take 1 tablet (50 mg total) by mouth 2 (two) times daily. 180 tablet 1  . hydrochlorothiazide (HYDRODIURIL) 25 MG tablet Take 1 tablet (25 mg total) by mouth daily. Please schedule appointment for refills. 90 tablet 3  . Multiple Vitamins-Minerals (ICAPS MV PO) Take 1 capsule by mouth 2 (two) times daily.     Marland Kitchen omeprazole (PRILOSEC) 20 MG capsule Take 20 mg by mouth every morning.     Vladimir Faster Glycol-Propyl Glycol (SYSTANE ULTRA OP) Apply 1 drop to eye as needed (dry eyes).    . vitamin B-12 (CYANOCOBALAMIN) 1000 MCG tablet Take 1,000 mcg by mouth daily.    Marland Kitchen warfarin (COUMADIN) 6 MG tablet Take 1 tablet by mouth daily.     No current facility-administered medications on file prior to visit.     Allergies  Allergen Reactions  . Cephalexin Other (See Comments)    unknown  . Codeine Other (See Comments)    unknown  . Metoclopramide Hcl Other (See Comments)    unknown    Past Medical History:  Diagnosis Date  . Arthritis    right  . Atrial fibrillation ([place])   . GERD (gastroesophageal reflux disease)    hx of  . Hepatitis C   . Humerus fracture   . Hyperlipidemia   . Hypertension   . Irritable bowel syndrome   . Meningioma ([place])   . PONV  (postoperative nausea and vomiting)     Past Surgical History:  Procedure Laterality Date  . BREAST SURGERY     lumpectomy  . CARDIOVASCULAR STRESS TEST  11/14/2006   EF 67%  . COLONOSCOPY W/ BIOPSIES AND POLYPECTOMY    . ORIF HUMERUS FRACTURE Right 04/18/2014   Procedure: OPEN REDUCTION INTERNAL FIXATION (ORIF) RIGHT PROXIMAL HUMERUS FRACTURE;  Surgeon: Netta Cedars, MD;  Location: [facility];  Service: Orthopedics;  Laterality: Right;  . ORIF PERIPROSTHETIC FRACTURE Left 04/30/2013   Procedure: OPEN REDUCTION INTERNAL FIXATION (ORIF) PERIPROSTHETIC FRACTURE;  Surgeon: [name], MD;  Location: [facility];  Service: Orthopedics;  Laterality: Left;  . PAROTID GLAND TUMOR EXCISION    . TOTAL HIP ARTHROPLASTY  11/07   left  . TOTAL KNEE ARTHROPLASTY    . TRANSTHORACIC ECHOCARDIOGRAM  10/25/2009   EF 55-60%    History  Smoking Status  . Former Smoker  . Packs/day: 0.30  . Years: 15.00  . Types: Cigarettes  . Quit date: 06/07/1980  Smokeless Tobacco  . Never Used    History  Alcohol Use No    Family History  Problem Relation Age of Onset  . Heart disease Brother     Review of Systems: As noted in history of present illness.  All other systems were reviewed and are negative.  Physical Exam: BP (!) 170/84 (BP Location: Right Arm, Patient Position: Sitting, Cuff Size: Normal)   Pulse 60   Ht 5\' 5"  (1.651 m)   Wt 144 lb 9.6 oz (65.6 kg)   BMI 24.06 kg/m  Gloria [name] is a pleasant white female in no acute distress. Her HEENT exam is unremarkable. Gloria [name] has no JVD or bruits. Lungs are clear. Cardiac exam reveals a regular rate and rhythm with a soft 1/6 systolic ejection murmur at right upper sternal border. Abdomen is soft and nontender without masses. Gloria [name] has no edema. Pedal pulses are good.  LABORATORY DATA: Lab Results  Component Value Date   WBC 8.6 04/20/2014   HGB 10.1 (L) 04/20/2014   HCT 31.4 (L) 04/20/2014   PLT 255 04/20/2014   GLUCOSE 105 (H) 04/19/2014   ALT 16 04/28/2013    AST 23 04/28/2013   NA 137 04/19/2014   K 3.5 04/19/2014   CL 99 04/19/2014   CREATININE 0.66 04/19/2014   BUN 11 04/19/2014   CO2 28 04/19/2014   INR 1.61 (H) 04/21/2014   Labs dated 03/07/15: cholesterol 214, triglycerides 93, LDL 131, HDL 64. CMET is normal.  Ecg today shows NSR with rate 60. LAD, LVH. Possible old septal infarct. I have personally reviewed and interpreted this study.  Assessment / Plan: 1. Atrial fibrillation- paroxysmal. This is well controlled on low dose flecainide. We will continue her current therapy.   2. Chronic anticoagulation, therapeutic. Continue Coumadin.  3. Hypertension, well controlled.  I will follow up in 6 months

## 2015-12-26 ENCOUNTER — Encounter: Payer: Self-pay | Admitting: Cardiology

## 2015-12-26 ENCOUNTER — Ambulatory Visit (INDEPENDENT_AMBULATORY_CARE_PROVIDER_SITE_OTHER): Payer: Medicare Other | Admitting: Cardiology

## 2015-12-26 VITALS — BP 170/84 | HR 60 | Ht 65.0 in | Wt 144.6 lb

## 2015-12-26 DIAGNOSIS — I480 Paroxysmal atrial fibrillation: Secondary | ICD-10-CM

## 2015-12-26 DIAGNOSIS — I10 Essential (primary) hypertension: Secondary | ICD-10-CM

## 2015-12-26 DIAGNOSIS — E7800 Pure hypercholesterolemia, unspecified: Secondary | ICD-10-CM | POA: Diagnosis not present

## 2015-12-26 NOTE — Patient Instructions (Addendum)
Continue your current therapy  I will see you in 6 months.   

## 2016-01-19 DIAGNOSIS — H938X1 Other specified disorders of right ear: Secondary | ICD-10-CM | POA: Diagnosis not present

## 2016-01-19 DIAGNOSIS — I480 Paroxysmal atrial fibrillation: Secondary | ICD-10-CM | POA: Diagnosis not present

## 2016-01-19 DIAGNOSIS — Z7901 Long term (current) use of anticoagulants: Secondary | ICD-10-CM | POA: Diagnosis not present

## 2016-01-19 DIAGNOSIS — L299 Pruritus, unspecified: Secondary | ICD-10-CM | POA: Diagnosis not present

## 2016-03-01 DIAGNOSIS — I480 Paroxysmal atrial fibrillation: Secondary | ICD-10-CM | POA: Diagnosis not present

## 2016-03-01 DIAGNOSIS — Z7901 Long term (current) use of anticoagulants: Secondary | ICD-10-CM | POA: Diagnosis not present

## 2016-03-20 DIAGNOSIS — E784 Other hyperlipidemia: Secondary | ICD-10-CM | POA: Diagnosis not present

## 2016-03-20 DIAGNOSIS — M859 Disorder of bone density and structure, unspecified: Secondary | ICD-10-CM | POA: Diagnosis not present

## 2016-03-20 DIAGNOSIS — I10 Essential (primary) hypertension: Secondary | ICD-10-CM | POA: Diagnosis not present

## 2016-03-25 DIAGNOSIS — Z7901 Long term (current) use of anticoagulants: Secondary | ICD-10-CM | POA: Diagnosis not present

## 2016-03-25 DIAGNOSIS — M25511 Pain in right shoulder: Secondary | ICD-10-CM | POA: Diagnosis not present

## 2016-03-25 DIAGNOSIS — Z6824 Body mass index (BMI) 24.0-24.9, adult: Secondary | ICD-10-CM | POA: Diagnosis not present

## 2016-03-25 DIAGNOSIS — Z1389 Encounter for screening for other disorder: Secondary | ICD-10-CM | POA: Diagnosis not present

## 2016-03-25 DIAGNOSIS — Z0000 Encounter for general adult medical examination without abnormal findings: Secondary | ICD-10-CM | POA: Diagnosis not present

## 2016-03-25 DIAGNOSIS — I480 Paroxysmal atrial fibrillation: Secondary | ICD-10-CM | POA: Diagnosis not present

## 2016-03-25 DIAGNOSIS — E784 Other hyperlipidemia: Secondary | ICD-10-CM | POA: Diagnosis not present

## 2016-03-25 DIAGNOSIS — M859 Disorder of bone density and structure, unspecified: Secondary | ICD-10-CM | POA: Diagnosis not present

## 2016-03-25 DIAGNOSIS — I10 Essential (primary) hypertension: Secondary | ICD-10-CM | POA: Diagnosis not present

## 2016-04-01 DIAGNOSIS — I480 Paroxysmal atrial fibrillation: Secondary | ICD-10-CM | POA: Diagnosis not present

## 2016-04-01 DIAGNOSIS — Z7901 Long term (current) use of anticoagulants: Secondary | ICD-10-CM | POA: Diagnosis not present

## 2016-05-01 DIAGNOSIS — Z7901 Long term (current) use of anticoagulants: Secondary | ICD-10-CM | POA: Diagnosis not present

## 2016-05-01 DIAGNOSIS — I480 Paroxysmal atrial fibrillation: Secondary | ICD-10-CM | POA: Diagnosis not present

## 2016-06-04 ENCOUNTER — Emergency Department (HOSPITAL_COMMUNITY): Payer: Medicare Other

## 2016-06-04 ENCOUNTER — Observation Stay (HOSPITAL_COMMUNITY)
Admission: EM | Admit: 2016-06-04 | Discharge: 2016-06-05 | Disposition: A | Discharge status: Home / Self Care | Payer: Medicare Other | Attending: Internal Medicine | Admitting: Internal Medicine

## 2016-06-04 ENCOUNTER — Encounter (HOSPITAL_COMMUNITY): Payer: Self-pay | Admitting: Nurse Practitioner

## 2016-06-04 DIAGNOSIS — Z79899 Other long term (current) drug therapy: Secondary | ICD-10-CM | POA: Diagnosis not present

## 2016-06-04 DIAGNOSIS — I208 Other forms of angina pectoris: Secondary | ICD-10-CM

## 2016-06-04 DIAGNOSIS — I10 Essential (primary) hypertension: Secondary | ICD-10-CM | POA: Diagnosis not present

## 2016-06-04 DIAGNOSIS — R0602 Shortness of breath: Secondary | ICD-10-CM

## 2016-06-04 DIAGNOSIS — R072 Precordial pain: Secondary | ICD-10-CM | POA: Diagnosis not present

## 2016-06-04 DIAGNOSIS — R079 Chest pain, unspecified: Secondary | ICD-10-CM | POA: Diagnosis present

## 2016-06-04 DIAGNOSIS — I480 Paroxysmal atrial fibrillation: Secondary | ICD-10-CM | POA: Diagnosis present

## 2016-06-04 DIAGNOSIS — E785 Hyperlipidemia, unspecified: Secondary | ICD-10-CM | POA: Insufficient documentation

## 2016-06-04 DIAGNOSIS — R42 Dizziness and giddiness: Secondary | ICD-10-CM | POA: Diagnosis not present

## 2016-06-04 DIAGNOSIS — R531 Weakness: Secondary | ICD-10-CM | POA: Diagnosis not present

## 2016-06-04 DIAGNOSIS — I4891 Unspecified atrial fibrillation: Secondary | ICD-10-CM | POA: Diagnosis present

## 2016-06-04 DIAGNOSIS — Z87891 Personal history of nicotine dependence: Secondary | ICD-10-CM | POA: Insufficient documentation

## 2016-06-04 DIAGNOSIS — Z7901 Long term (current) use of anticoagulants: Secondary | ICD-10-CM | POA: Diagnosis not present

## 2016-06-04 DIAGNOSIS — R0789 Other chest pain: Secondary | ICD-10-CM | POA: Diagnosis present

## 2016-06-04 LAB — CBC
HCT: 42.6 % (ref 36.0–46.0)
Hemoglobin: 14 g/dL (ref 12.0–15.0)
MCH: 29.9 pg (ref 26.0–34.0)
MCHC: 32.9 g/dL (ref 30.0–36.0)
MCV: 90.8 fL (ref 78.0–100.0)
Platelets: 229 10*3/uL (ref 150–400)
RBC: 4.69 MIL/uL (ref 3.87–5.11)
RDW: 14.9 % (ref 11.5–15.5)
WBC: 7.5 10*3/uL (ref 4.0–10.5)

## 2016-06-04 LAB — BASIC METABOLIC PANEL
Anion gap: 10 (ref 5–15)
BUN: 12 mg/dL (ref 6–20)
CO2: 26 mmol/L (ref 22–32)
Calcium: 9.3 mg/dL (ref 8.9–10.3)
Chloride: 102 mmol/L (ref 101–111)
Creatinine, Ser: 0.71 mg/dL (ref 0.44–1.00)
GFR calc Af Amer: >60 mL/min (ref >60)
GFR calc non Af Amer: >60 mL/min (ref >60)
Glucose, Bld: 89 mg/dL (ref 65–99)
Potassium: 3.7 mmol/L (ref 3.5–5.1)
Sodium: 138 mmol/L (ref 135–145)

## 2016-06-04 LAB — PROTIME-INR
INR: 2.38
Prothrombin Time: 26.4 seconds — ABNORMAL HIGH (ref 11.4–15.2)

## 2016-06-04 LAB — I-STAT TROPONIN, ED: Troponin i, poc: 0 ng/mL (ref 0.00–0.08)

## 2016-06-04 LAB — TROPONIN I
Troponin I: <0.03 ng/mL (ref <0.03)
Troponin I: <0.03 ng/mL (ref <0.03)

## 2016-06-04 MED ORDER — FLECAINIDE ACETATE 50 MG PO TABS
50.0000 mg | ORAL_TABLET | Freq: Two times a day (BID) | ORAL | Status: DC
  Administered 2016-06-04 – 2016-06-05 (×2): 50 mg via ORAL
  Filled 2016-06-04 (×3): qty 1

## 2016-06-04 MED ORDER — NITROGLYCERIN 0.4 MG SL SUBL
0.4000 mg | SUBLINGUAL_TABLET | SUBLINGUAL | Status: DC | PRN
  Administered 2016-06-04 – 2016-06-05 (×3): 0.4 mg via SUBLINGUAL
  Filled 2016-06-04 (×2): qty 1

## 2016-06-04 MED ORDER — ONDANSETRON HCL 4 MG/2ML IJ SOLN: 4.0000 mg | Freq: Four times a day (QID) | INTRAMUSCULAR | Status: DC | PRN

## 2016-06-04 MED ORDER — MORPHINE SULFATE (PF) 4 MG/ML IV SOLN
2.0000 mg | INTRAVENOUS | Status: DC | PRN
  Administered 2016-06-04: 2 mg via INTRAVENOUS
  Filled 2016-06-04: qty 1

## 2016-06-04 MED ORDER — PANTOPRAZOLE SODIUM 40 MG PO TBEC
40.0000 mg | DELAYED_RELEASE_TABLET | Freq: Every day | ORAL | Status: DC
  Administered 2016-06-05: 40 mg via ORAL
  Filled 2016-06-04: qty 1

## 2016-06-04 MED ORDER — VITAMIN B-12 1000 MCG PO TABS
1000.0000 ug | ORAL_TABLET | Freq: Every day | ORAL | Status: DC
  Administered 2016-06-05: 1000 ug via ORAL
  Filled 2016-06-04: qty 1

## 2016-06-04 MED ORDER — ASPIRIN EC 325 MG PO TBEC
325.0000 mg | DELAYED_RELEASE_TABLET | Freq: Every day | ORAL | Status: DC
  Administered 2016-06-04 – 2016-06-05 (×2): 325 mg via ORAL
  Filled 2016-06-04: qty 1

## 2016-06-04 MED ORDER — HYDRALAZINE HCL 20 MG/ML IJ SOLN
5.0000 mg | Freq: Four times a day (QID) | INTRAMUSCULAR | Status: DC | PRN
  Administered 2016-06-04: 5 mg via INTRAVENOUS
  Filled 2016-06-04: qty 1

## 2016-06-04 MED ORDER — HYDROCHLOROTHIAZIDE 25 MG PO TABS
25.0000 mg | ORAL_TABLET | Freq: Every day | ORAL | Status: DC
  Administered 2016-06-05: 25 mg via ORAL
  Filled 2016-06-04: qty 1

## 2016-06-04 MED ORDER — GI COCKTAIL ~~LOC~~
30.0000 mL | Freq: Four times a day (QID) | ORAL | Status: DC | PRN
  Filled 2016-06-04: qty 30

## 2016-06-04 MED ORDER — ACETAMINOPHEN 325 MG PO TABS
650.0000 mg | ORAL_TABLET | ORAL | Status: DC | PRN
  Administered 2016-06-04: 650 mg via ORAL
  Filled 2016-06-04: qty 2

## 2016-06-04 MED ORDER — ENOXAPARIN SODIUM 80 MG/0.8ML ~~LOC~~ SOLN
1.0000 mg/kg | Freq: Once | SUBCUTANEOUS | Status: AC
  Administered 2016-06-04: 70 mg via SUBCUTANEOUS
  Filled 2016-06-04: qty 0.8

## 2016-06-04 NOTE — ED Notes (Signed)
Bed: WA02 Expected date:  Expected time:  Means of arrival:  Comments: EMS-weak

## 2016-06-04 NOTE — H&P (Signed)
History and Physical    Gloria [name] QBH:[id number] DOB: [birth year] DOA: 06/04/2016  PCP: Leanna Battles, MD  Outpatient cardiologist: Martinique, Peter MD  Patient coming from: Home  I have personally briefly reviewed patient's old medical records in [place]  Chief Complaint: Chest pressure and shortness of breath  HPI: Gloria [name] is a 83 y.o. female with medical history significant of paroxysmal atrial flutter ablation on Coumadin, hypertension, dyslipidemia and irritable bowel syndrome presented with chief complaint of sudden onset of difficulty breathing and chest pressure that started around 10:00 this morning. Patient states that her chest pressure is like chest tightness which is associated with some exertional shortness of breath. No radiation of her chest pressure. No aggravating or relieving factors. Shortness of breath has improved. No history of recent fever, chills, nausea, vomiting, abdominal pain. She had 2 episodes of loose bowel movements this morning and slightly increased urination without burning or hematuria. No history of recent chest trauma, syncope or loss of consciousness or leg swelling.  ED Course: initial troponin was negative; EKG had shown sinus bradycardia. She was given nitroglycerin. Hospitalist service was called to admit the patient.  Review of Systems: As per HPI otherwise 10 point review of systems negative.    Past Medical History:  Diagnosis Date  . Arthritis    right  . Atrial fibrillation ([place])   . GERD (gastroesophageal reflux disease)    hx of  . Hepatitis C   . Humerus fracture   . Hyperlipidemia   . Hypertension   . Irritable bowel syndrome   . Meningioma ([place])   . PONV (postoperative nausea and vomiting)     Past Surgical History:  Procedure Laterality Date  . BREAST SURGERY     lumpectomy  . CARDIOVASCULAR STRESS TEST  11/14/2006   EF 67%  . COLONOSCOPY W/ BIOPSIES AND POLYPECTOMY    . ORIF HUMERUS FRACTURE  Right 04/18/2014   Procedure: OPEN REDUCTION INTERNAL FIXATION (ORIF) RIGHT PROXIMAL HUMERUS FRACTURE;  Surgeon: Netta Cedars, MD;  Location: [facility];  Service: Orthopedics;  Laterality: Right;  . ORIF PERIPROSTHETIC FRACTURE Left 04/30/2013   Procedure: OPEN REDUCTION INTERNAL FIXATION (ORIF) PERIPROSTHETIC FRACTURE;  Surgeon: [name], MD;  Location: [facility];  Service: Orthopedics;  Laterality: Left;  . PAROTID GLAND TUMOR EXCISION    . TOTAL HIP ARTHROPLASTY  11/07   left  . TOTAL KNEE ARTHROPLASTY    . TRANSTHORACIC ECHOCARDIOGRAM  10/25/2009   EF 55-60%   Social history  reports that she quit smoking about 36 years ago. Her smoking use included Cigarettes. She has a 4.50 pack-year smoking history. She has never used smokeless tobacco. She reports that she does not drink alcohol or use drugs.  Allergies  Allergen Reactions  . Cephalexin Other (See Comments)    unknown  . Codeine Other (See Comments)    unknown  . Metoclopramide Hcl Other (See Comments)    unknown    Family History  Problem Relation Age of Onset  . Heart disease Brother     Prior to Admission medications   Medication Sig Start Date End Date Taking? Authorizing Provider  Calcium Carb-Cholecalciferol (CALCIUM 600 + D PO) Take 1 tablet by mouth 2 (two) times daily.   Yes [provider]  flecainide (TAMBOCOR) 50 MG tablet Take 1 tablet (50 mg total) by mouth 2 (two) times daily. 04/29/11  Yes Martinique, Peter M, MD  hydrochlorothiazide (HYDRODIURIL) 25 MG tablet Take 1 tablet (25 mg total)  by mouth daily. Please schedule appointment for refills. 06/27/15  Yes Martinique, Peter M, MD  Multiple Vitamins-Minerals (ICAPS MV PO) Take 1 capsule by mouth 2 (two) times daily.    Yes [provider]  omeprazole (PRILOSEC) 20 MG capsule Take 20 mg by mouth every morning.    Yes [provider]  Polyethyl Glycol-Propyl Glycol (SYSTANE ULTRA OP) Apply 1 drop to eye as needed (dry eyes).   Yes [provider]  vitamin B-12 (CYANOCOBALAMIN) 1000 MCG tablet Take 1,000 mcg by mouth daily.   Yes [provider]  warfarin (COUMADIN) 6 MG tablet Take 1 tablet by mouth daily. 08/16/14  Yes [provider]    Physical Exam: Vitals:   06/04/16 1300 06/04/16 1330 06/04/16 1400 06/04/16 1430  BP: (!) 190/67 (!) 168/65 131/67 (!) 155/63  Pulse: (!) 44 (!) 44 (!) 50 (!) 47  Resp: 13 16 20  (!) 29  Temp:      TempSrc:      SpO2: 97% 100% 97% 99%  Weight:      Height:        Constitutional: NAD, calm, comfortable Vitals:   06/04/16 1300 06/04/16 1330 06/04/16 1400 06/04/16 1430  BP: (!) 190/67 (!) 168/65 131/67 (!) 155/63  Pulse: (!) 44 (!) 44 (!) 50 (!) 47  Resp: 13 16 20  (!) 29  Temp:      TempSrc:      SpO2: 97% 100% 97% 99%  Weight:      Height:       Eyes: PERRL, lids and conjunctivae normal ENMT: Mucous membranes are moist. Posterior pharynx clear of any exudate or lesions.Normal dentition.  Neck: normal, supple, no masses, no thyromegaly Respiratory: Bilateral decreased breath sounds at bases. No wheezing or crackles Cardiovascular: S1 and S2 positive, bradycardic. No murmurs Abdomen: no tenderness, no masses palpated. No hepatosplenomegaly. Bowel sounds positive.  Neurologic: CN 2-12 grossly intact. Sensation intact, DTR normal. Strength 5/5 in all 4.  Extremities: No cyanosis, clubbing, edema  Labs on Admission: I have personally reviewed following labs and imaging studies  CBC:  Recent Labs Lab 06/04/16 1229  WBC 7.5  HGB 14.0  HCT 42.6  MCV 90.8  PLT 381   Basic Metabolic Panel:  Recent Labs Lab 06/04/16 1229  NA 138  K 3.7  CL 102  CO2 26  GLUCOSE 89  BUN 12  CREATININE 0.71  CALCIUM 9.3   GFR: Estimated Creatinine Clearance: 49 mL/min (by C-G formula based on SCr of 0.71 mg/dL). Liver Function Tests: No results for input(s): AST, ALT, ALKPHOS, BILITOT, PROT, ALBUMIN in the last 168 hours. No results for input(s): LIPASE,  AMYLASE in the last 168 hours. No results for input(s): AMMONIA in the last 168 hours. Coagulation Profile: No results for input(s): INR, PROTIME in the last 168 hours. Cardiac Enzymes: No results for input(s): CKTOTAL, CKMB, CKMBINDEX, TROPONINI in the last 168 hours. BNP (last 3 results) No results for input(s): PROBNP in the last 8760 hours. HbA1C: No results for input(s): HGBA1C in the last 72 hours. CBG: No results for input(s): GLUCAP in the last 168 hours. Lipid Profile: No results for input(s): CHOL, HDL, LDLCALC, TRIG, CHOLHDL, LDLDIRECT in the last 72 hours. Thyroid Function Tests: No results for input(s): TSH, T4TOTAL, FREET4, T3FREE, THYROIDAB in the last 72 hours. Anemia Panel: No results for input(s): VITAMINB12, FOLATE, FERRITIN, TIBC, IRON, RETICCTPCT in the last 72 hours. Urine analysis:    Component Value Date/Time   COLORURINE YELLOW 05/19/2013  [place] CLOUDY (A) 05/19/2013 0917   LABSPEC 1.012 05/19/2013 0917   PHURINE 7.5 05/19/2013 0917   GLUCOSEU NEGATIVE 05/19/2013 0917   HGBUR NEGATIVE 05/19/2013 0917   BILIRUBINUR NEGATIVE 05/19/2013 [place] 05/19/2013 0917   PROTEINUR NEGATIVE 05/19/2013 0917   UROBILINOGEN 0.2 05/19/2013 0917   NITRITE NEGATIVE 05/19/2013 0917   LEUKOCYTESUR NEGATIVE 05/19/2013 0917    Radiological Exams on Admission: Dg Chest 2 View  Result Date: 06/04/2016 CLINICAL DATA:  Shortness of Breath EXAM: CHEST  2 VIEW COMPARISON:  Chest radiograph May 19, 2013 and chest CT April 12, 2014 FINDINGS: There is no edema or consolidation. Heart size and pulmonary vascularity are normal. No evident adenopathy. There is aortic atherosclerosis. Postoperative changes noted in the proximal right humerus. There is mild degenerative change in the thoracic spine. IMPRESSION: Aortic atherosclerosis.  No evident edema or consolidation. Electronically Signed   By: Lowella Grip III M.D.   On: 06/04/2016 13:25     Assessment/Plan Principal Problem:   Chest pain Active Problems:   Hypertension   Atrial fibrillation (HCC)   Warfarin anticoagulation  1. Chest pain for evaluation:  - Initial troponin negative. Initial EKG showing sinus bradycardia. -Patient observation unit. Repeat cardiac enzymes. 2-D echo. Cardiology consultation has been placed. Nothing by mouth for tomorrow morning in case patient undergoes stress test.  - Aspirin, Lovenox therapeutic dosing.  2. History of paroxysmal atrial fibrillation on anticoagulation with Coumadin: - Currently slightly bradycardic. Hold Coumadin for now; therapeutic Lovenox will be used. Continue flecainide  3. Hypertension - Monitor blood pressure. Continue home hydrochlorothiazide    DVT prophylaxis: Lovenox  Code Status: DO NOT RESUSCITATE Family Communication: Spoke to daughter present at bedside Disposition Plan: Home in the next 1 or 2 days once cleared by cardiology Consults called: Cardiology  Admission status: Observation   Aline August MD [facility] Pager [phone]  If 7PM-7AM, please contact night-coverage [email or web address] Password [email or web address]  06/04/2016, 4:15 PM

## 2016-06-04 NOTE — ED Provider Notes (Signed)
Emergency Department Provider Note   I have reviewed the triage vital signs and the nursing notes.   HISTORY  Chief Complaint Shortness of Breath and Weakness   HPI Gloria [name] is a 83 y.o. female with a-fib on Coumadin, HTN, HLD, and IBS resents to the emergency department for evaluation of sudden onset of difficulty breathing and chest tightness. She describes chest tightness mainly with breathing but continues to have some residual tightness in the center upper chest that is nonradiating. Shortness of breath has largely resolved. No history of similar symptoms. No history of heart attack. She does follow with cardiology, Dr. Marlou Porch, for a-fib and has been compliant with coumadin. No exertional or pleuritic symptoms. No fevers, chills, productive cough. No modifying factors.   Past Medical History:  Diagnosis Date  . Arthritis    right  . Atrial fibrillation ([place])   . GERD (gastroesophageal reflux disease)    hx of  . Hepatitis C   . Humerus fracture   . Hyperlipidemia   . Hypertension   . Irritable bowel syndrome   . Meningioma ([place])   . PONV (postoperative nausea and vomiting)     Patient Active Problem List   Diagnosis Date Noted  . Chest pain 06/04/2016  . Shoulder fracture 04/18/2014  . Closed left hip fracture ([place]) 04/28/2013  . Hip fracture ([place]) 04/28/2013  . Dyspnea on exertion 06/24/2012  . Nausea & vomiting 04/09/2011    Class: Acute  . Diarrhea 04/09/2011    Class: Acute  . Hypokalemia 04/09/2011    Class: Acute  . Gastroparesis 04/09/2011    Class: Chronic  . Warfarin anticoagulation 04/09/2011    Class: Chronic  . Hypertension   . Hyperlipidemia   . Irritable bowel syndrome   . Atrial fibrillation [facility])     Past Surgical History:  Procedure Laterality Date  . BREAST SURGERY     lumpectomy  . CARDIOVASCULAR STRESS TEST  11/14/2006   EF 67%  . COLONOSCOPY W/ BIOPSIES AND POLYPECTOMY    . ORIF HUMERUS FRACTURE Right 04/18/2014   Procedure: OPEN REDUCTION INTERNAL FIXATION (ORIF) RIGHT PROXIMAL HUMERUS FRACTURE;  Surgeon: Netta Cedars, MD;  Location: [facility];  Service: Orthopedics;  Laterality: Right;  . ORIF PERIPROSTHETIC FRACTURE Left 04/30/2013   Procedure: OPEN REDUCTION INTERNAL FIXATION (ORIF) PERIPROSTHETIC FRACTURE;  Surgeon: [name], MD;  Location: [facility];  Service: Orthopedics;  Laterality: Left;  . PAROTID GLAND TUMOR EXCISION    . TOTAL HIP ARTHROPLASTY  11/07   left  . TOTAL KNEE ARTHROPLASTY    . TRANSTHORACIC ECHOCARDIOGRAM  10/25/2009   EF 55-60%      Allergies Cephalexin; Codeine; and Metoclopramide hcl  Family History  Problem Relation Age of Onset  . Heart disease Brother     Social History Social History  Substance Use Topics  . Smoking status: Former Smoker    Packs/day: 0.30    Years: 15.00    Types: Cigarettes    Quit date: 06/07/1980  . Smokeless tobacco: Never Used  . Alcohol use No    Review of Systems  Constitutional: No fever/chills Eyes: No visual changes. ENT: No sore throat. Cardiovascular: Positive chest pain. Respiratory: Positive shortness of breath. Gastrointestinal: No abdominal pain.  No nausea, no vomiting.  No diarrhea.  No constipation. Genitourinary: Negative for dysuria. Musculoskeletal: Negative for back pain. Skin: Negative for rash. Neurological: Negative for headaches, focal weakness or numbness.  10-point ROS otherwise negative.  ____________________________________________   PHYSICAL EXAM:  VITAL SIGNS: ED Triage Vitals  Enc Vitals Group     BP 06/04/16 1211 (!) 166/74     Pulse Rate 06/04/16 1211 (!) 50     Resp 06/04/16 1211 18     Temp 06/04/16 1211 98 F (36.7 C)     Temp Source 06/04/16 1211 Oral     SpO2 06/04/16 1203 98 %     Weight 06/04/16 1226 150 lb (68 kg)     Height 06/04/16 1226 5\' 6"  (1.676 m)   Constitutional: Alert and oriented. Well appearing and in no acute distress. Eyes: Conjunctivae are normal. Head:  Atraumatic. Nose: No congestion/rhinnorhea. Mouth/Throat: Mucous membranes are moist. Neck: No stridor.   Cardiovascular: Normal rate, regular rhythm. Good peripheral circulation. Grossly normal heart sounds.   Respiratory: Normal respiratory effort.  No retractions. Lungs CTAB. Gastrointestinal: Soft and nontender. No distention.  Musculoskeletal: No lower extremity tenderness nor edema. No gross deformities of extremities. Neurologic:  Normal speech and language. No gross focal neurologic deficits are appreciated.  Skin:  Skin is warm, dry and intact. No rash noted. Psychiatric: Mood and affect are normal. Speech and behavior are normal. ____________________________________________   LABS (all labs ordered are listed, but only abnormal results are displayed)  Labs Reviewed  PROTIME-INR - Abnormal; Notable for the following:       Result Value   Prothrombin Time 26.4 (*)    All other components within normal limits  BASIC METABOLIC PANEL  CBC  TROPONIN I  TROPONIN I  PROTIME-INR  I-STAT TROPOININ, ED   ____________________________________________  EKG   EKG Interpretation  Date/Time:  Tuesday Jun 04 2016 12:10:21 EDT Ventricular Rate:  46 PR Interval:    QRS Duration: 110 QT Interval:  506 QTC Calculation: 443 R Axis:   -37 Text Interpretation:  Sinus bradycardia Left ventricular hypertrophy Anterior infarct, old No STEMI.  Confirmed by Nanda Quinton [phone]) on 06/04/2016 1:45:46 PM       ____________________________________________  RADIOLOGY  Dg Chest 2 View  Result Date: 06/04/2016 CLINICAL DATA:  Shortness of Breath EXAM: CHEST  2 VIEW COMPARISON:  Chest radiograph May 19, 2013 and chest CT April 12, 2014 FINDINGS: There is no edema or consolidation. Heart size and pulmonary vascularity are normal. No evident adenopathy. There is aortic atherosclerosis. Postoperative changes noted in the proximal right humerus. There is mild degenerative change in the thoracic  spine. IMPRESSION: Aortic atherosclerosis.  No evident edema or consolidation. Electronically Signed   By: Lowella Grip III M.D.   On: 06/04/2016 13:25    ____________________________________________   PROCEDURES  Procedure(s) performed:   Procedures  None ____________________________________________   INITIAL IMPRESSION / ASSESSMENT AND PLAN / ED COURSE  Pertinent labs & imaging results that were available during my care of the patient were reviewed by me and considered in my medical decision making (see chart for details).  Patient resents to the emergency department for evaluation of difficulty breathing with associated chest tightness. No history of COPD or asthma. Patient does have many coronary artery disease risk factors. Given her age, risk factors of concern for atypical ACS symptoms. Labs and CXR are unremarkable. Discussed admission for CP evaluation. No prior history of cath of ACS w/u.   Discussed patient's case with Hospitalist. Patient and family (if present) updated with plan. Care transferred to Hospitalist service.  I reviewed all nursing notes, vitals, pertinent old records, EKGs, labs, imaging (as available).  ____________________________________________  FINAL CLINICAL IMPRESSION(S) / ED DIAGNOSES  Final diagnoses:  Precordial chest pain  Shortness of breath     MEDICATIONS GIVEN DURING THIS VISIT:  Medications  nitroGLYCERIN (NITROSTAT) SL tablet 0.4 mg (0.4 mg Sublingual Given 06/04/16 1350)  hydrochlorothiazide (HYDRODIURIL) tablet 25 mg (not administered)  pantoprazole (PROTONIX) EC tablet 40 mg (not administered)  vitamin B-12 (CYANOCOBALAMIN) tablet 1,000 mcg (not administered)  flecainide (TAMBOCOR) tablet 50 mg (not administered)  acetaminophen (TYLENOL) tablet 650 mg (not administered)  ondansetron (ZOFRAN) injection 4 mg (not administered)  morphine 4 MG/ML injection 2 mg (not administered)  aspirin EC tablet 325 mg (325 mg Oral Given  06/04/16 1800)  gi cocktail (Maalox,Lidocaine,Donnatal) (not administered)  hydrALAZINE (APRESOLINE) injection 5 mg (not administered)  enoxaparin (LOVENOX) injection 70 mg (70 mg Subcutaneous Given 06/04/16 1813)     NEW OUTPATIENT MEDICATIONS STARTED DURING THIS VISIT:  None   Note:  This document was prepared using Dragon voice recognition software and may include unintentional dictation errors.  Nanda Quinton, MD Emergency Medicine   Aundrey Elahi, [facility], MD 06/04/16 [phone]

## 2016-06-04 NOTE — Progress Notes (Addendum)
ANTICOAGULATION CONSULT NOTE - Initial Consult  Pharmacy Consult for Lovenox & Warfarin Indication: AFib (warfarin PTA)/ CP/NSTEMI/ACS  Allergies  Allergen Reactions  . Cephalexin Other (See Comments)    unknown  . Codeine Other (See Comments)    unknown  . Metoclopramide Hcl Other (See Comments)    unknown    Patient Measurements: Height: 5\' 6"  (167.6 cm) Weight: 150 lb (68 kg) IBW/kg (Calculated) : 59.3  Vital Signs: Temp: 97.8 F (36.6 C) (05/29 1647) Temp Source: Oral (05/29 1647) BP: 175/66 (05/29 1647) Pulse Rate: 45 (05/29 1647)  Labs:  Recent Labs  06/04/16 1229 06/04/16 1236  HGB 14.0  --   HCT 42.6  --   PLT 229  --   LABPROT  --  26.4*  INR  --  2.38  CREATININE 0.71  --     Estimated Creatinine Clearance: 49 mL/min (by C-G formula based on SCr of 0.71 mg/dL).   Medical History: Past Medical History:  Diagnosis Date  . Arthritis    right  . Atrial fibrillation ([place])   . GERD (gastroesophageal reflux disease)    hx of  . Hepatitis C   . Humerus fracture   . Hyperlipidemia   . Hypertension   . Irritable bowel syndrome   . Meningioma ([place])   . PONV (postoperative nausea and vomiting)     Assessment:  83 yr female with PMH significant for AFib, HTN, hyperlipidemia with complaints of chest pressure and difficulty breathing  Pharmacy initially consulted to dose warfarin, but to hold off on dosing until patient evaluated by cardiology  Pharmacy then consulted to dose therapeutic Lovenox for ACS/STEMI/Chest Pain.  INR drawn and resulted = 2.38 (therapeutic).  Contacted Dr Starla Link to make aware of therapeutic INR.  As patient is symptomatic, he confirmed that he did want the Lovenox to still be given  Goal of Therapy:  INR 2-3 Monitor platelets by anticoagulation protocol: Yes   Plan:   Warfarin on hold until evaluated by cardiology  Lovenox 70mg  sq x1 tonight per Dr Lindalou Hose order  Daily INR  If AM INR < 2, will order Lovenox 1mg /kg sq  q12h  F/U cardiology recommendations  Laakea Pereira, Toribio [place], PharmD 06/04/2016,5:23 PM

## 2016-06-04 NOTE — ED Notes (Signed)
Pt state she have little relief taking nitrostat SL.

## 2016-06-04 NOTE — ED Notes (Signed)
Transport call will be here soon to transport patient.

## 2016-06-04 NOTE — ED Triage Notes (Signed)
Pt arrived via EMS from home with c/o weakness and shortness of breath. Per ems pt reports shortness of breath with activity and rest. Patient was concerned because she felt like everytime she took a breath she couldn't deep breath. Denies any recent cold, congestion, cough. Hx of afibb controlled.   HR 50, 142/78, 98% 18 resp, 18RA  CBG 94. Speaking in complete sentences, able to stand and walk without difficulty, denies chest pain, headaches, nausea, vomiting, pain or discomfort.

## 2016-06-05 ENCOUNTER — Observation Stay (HOSPITAL_BASED_OUTPATIENT_CLINIC_OR_DEPARTMENT_OTHER): Payer: Medicare Other

## 2016-06-05 ENCOUNTER — Ambulatory Visit (HOSPITAL_BASED_OUTPATIENT_CLINIC_OR_DEPARTMENT_OTHER)
Admit: 2016-06-05 | Discharge: 2016-06-05 | Disposition: A | Discharge status: Home / Self Care | Payer: Medicare Other | Attending: Physician Assistant | Admitting: Physician Assistant

## 2016-06-05 ENCOUNTER — Encounter: Payer: Self-pay | Admitting: Cardiology

## 2016-06-05 DIAGNOSIS — R079 Chest pain, unspecified: Secondary | ICD-10-CM

## 2016-06-05 DIAGNOSIS — R072 Precordial pain: Secondary | ICD-10-CM | POA: Diagnosis not present

## 2016-06-05 DIAGNOSIS — Z87891 Personal history of nicotine dependence: Secondary | ICD-10-CM | POA: Diagnosis not present

## 2016-06-05 DIAGNOSIS — I10 Essential (primary) hypertension: Secondary | ICD-10-CM

## 2016-06-05 DIAGNOSIS — R531 Weakness: Secondary | ICD-10-CM | POA: Diagnosis not present

## 2016-06-05 DIAGNOSIS — E785 Hyperlipidemia, unspecified: Secondary | ICD-10-CM | POA: Diagnosis not present

## 2016-06-05 DIAGNOSIS — Z7901 Long term (current) use of anticoagulants: Secondary | ICD-10-CM | POA: Diagnosis not present

## 2016-06-05 DIAGNOSIS — I480 Paroxysmal atrial fibrillation: Secondary | ICD-10-CM | POA: Diagnosis not present

## 2016-06-05 DIAGNOSIS — I360 Nonrheumatic tricuspid (valve) stenosis: Secondary | ICD-10-CM

## 2016-06-05 DIAGNOSIS — R0602 Shortness of breath: Secondary | ICD-10-CM | POA: Diagnosis not present

## 2016-06-05 DIAGNOSIS — Z79899 Other long term (current) drug therapy: Secondary | ICD-10-CM | POA: Diagnosis not present

## 2016-06-05 LAB — NM MYOCAR MULTI W/SPECT W/WALL MOTION / EF
MPHR: 136 {beats}/min
Peak HR: 100 {beats}/min
Percent HR: 73 %
Rest HR: 51 {beats}/min

## 2016-06-05 LAB — MAGNESIUM: Magnesium: 2.1 mg/dL (ref 1.7–2.4)

## 2016-06-05 LAB — ECHOCARDIOGRAM COMPLETE
Height: 66 in
Weight: 2328.06 oz

## 2016-06-05 LAB — PROTIME-INR
INR: 1.91
Prothrombin Time: 22.2 seconds — ABNORMAL HIGH (ref 11.4–15.2)

## 2016-06-05 LAB — TROPONIN I: Troponin I: <0.03 ng/mL (ref <0.03)

## 2016-06-05 LAB — TSH: TSH: 2.129 u[IU]/mL (ref 0.350–4.500)

## 2016-06-05 MED ORDER — WARFARIN SODIUM 5 MG PO TABS
6.0000 mg | ORAL_TABLET | Freq: Once | ORAL | Status: DC
  Filled 2016-06-05: qty 1

## 2016-06-05 MED ORDER — TECHNETIUM TC 99M TETROFOSMIN IV KIT
10.0000 | PACK | Freq: Once | INTRAVENOUS | Status: AC | PRN
  Administered 2016-06-05: 10 via INTRAVENOUS

## 2016-06-05 MED ORDER — AMINOPHYLLINE 25 MG/ML IV SOLN
INTRAVENOUS | Status: AC
  Filled 2016-06-05: qty 10

## 2016-06-05 MED ORDER — WARFARIN - PHARMACIST DOSING INPATIENT: Freq: Every day | Status: DC

## 2016-06-05 MED ORDER — TECHNETIUM TC 99M TETROFOSMIN IV KIT
30.0000 | PACK | Freq: Once | INTRAVENOUS | Status: AC | PRN
  Administered 2016-06-05: 30 via INTRAVENOUS

## 2016-06-05 MED ORDER — REGADENOSON 0.4 MG/5ML IV SOLN
0.4000 mg | Freq: Once | INTRAVENOUS | Status: AC
  Administered 2016-06-05: 0.4 mg via INTRAVENOUS

## 2016-06-05 MED ORDER — AMLODIPINE BESYLATE 5 MG PO TABS: 5.0000 mg | ORAL_TABLET | Freq: Every day | ORAL | 3 refills | Status: DC

## 2016-06-05 MED ORDER — ASPIRIN EC 81 MG PO TBEC: 81.0000 mg | DELAYED_RELEASE_TABLET | Freq: Every day | ORAL | Status: DC

## 2016-06-05 MED ORDER — AMLODIPINE BESYLATE 5 MG PO TABS
5.0000 mg | ORAL_TABLET | Freq: Every day | ORAL | Status: DC
  Administered 2016-06-05: 5 mg via ORAL
  Filled 2016-06-05: qty 1

## 2016-06-05 MED ORDER — ENOXAPARIN SODIUM 80 MG/0.8ML ~~LOC~~ SOLN
1.0000 mg/kg | Freq: Two times a day (BID) | SUBCUTANEOUS | Status: DC
  Administered 2016-06-05: 65 mg via SUBCUTANEOUS
  Filled 2016-06-05: qty 0.8

## 2016-06-05 MED ORDER — REGADENOSON 0.4 MG/5ML IV SOLN
INTRAVENOUS | Status: AC
Start: 2016-06-05 — End: 2016-06-06
  Filled 2016-06-05: qty 5

## 2016-06-05 MED ORDER — TECHNETIUM TC 99M TETROFOSMIN IV KIT: 1616.0000 | PACK | Freq: Once | INTRAVENOUS | Status: DC | PRN

## 2016-06-05 MED ORDER — NITROGLYCERIN 0.4 MG SL SUBL: 0.4000 mg | SUBLINGUAL_TABLET | SUBLINGUAL | 0 refills | Status: DC | PRN

## 2016-06-05 NOTE — Progress Notes (Signed)
   Gloria [name] presented for a nuclear stress test today.  No immediate complications.  Stress imaging is pending at this time.  Preliminary EKG findings may be listed in the chart, but the stress test result will not be finalized until perfusion imaging is complete.  Tami Lin Vashon Arch, PA-C 06/05/2016, 3:57 PM

## 2016-06-05 NOTE — Progress Notes (Signed)
Ordered lexiscan myoview test for today. Contacted the nuc med lab and they are expecting her. Contacted the nurse to arrange [facility] for transportation. Contacted Dr. Erlinda Hong to arrange for an observation bed at [facility] for after the study while waiting for it to be read. If study negative, she may discharge home today.    Ledora Bottcher, PA-C 06/05/2016, 12:29 PM [place], [place] [address]

## 2016-06-05 NOTE — Progress Notes (Signed)
Patient transported by [facility] to [facility]. Patient is stable at transfer.

## 2016-06-05 NOTE — Discharge Summary (Signed)
Discharge Summary  Gloria [name] WFU:[id number] DOB: September 08, 1930  PCP: Leanna Battles, MD  Admit date: 06/04/2016 Discharge date: 06/05/2016  Time spent: >59mins, more than 50% time spent on coordination of care.  Recommendations for Outpatient Follow-up:  1. F/u with PMD within a week  for hospital discharge follow up, repeat cbc/bmp at follow up 2. F/u with cardiology  Discharge Diagnoses:  Active Hospital Problems   Diagnosis Date Noted  . Chest pain 06/04/2016  . Warfarin anticoagulation 04/09/2011    Class: Chronic  . Atrial fibrillation ([place])   . Hypertension     Resolved Hospital Problems   Diagnosis Date Noted Date Resolved  No resolved problems to display.    Discharge Condition: stable  Diet recommendation: heart healthy  Filed Weights   06/04/16 1226 06/04/16 1851  Weight: 68 kg (150 lb) 66 kg (145 lb 8.1 oz)    History of present illness:  PCP: Leanna Battles, MD  Outpatient cardiologist: Martinique, Peter MD  Patient coming from: Home  I have personally briefly reviewed patient's old medical records in [place]  Chief Complaint: Chest pressure and shortness of breath  HPI: Gloria [name] is a 83 y.o. female with medical history significant of paroxysmal atrial flutter ablation on Coumadin, hypertension, dyslipidemia and irritable bowel syndrome presented with chief complaint of sudden onset of difficulty breathing and chest pressure that started around 10:00 this morning. Patient states that her chest pressure is like chest tightness which is associated with some exertional shortness of breath. No radiation of her chest pressure. No aggravating or relieving factors. Shortness of breath has improved. No history of recent fever, chills, nausea, vomiting, abdominal pain. She had 2 episodes of loose bowel movements this morning and slightly increased urination without burning or hematuria. No history of recent chest trauma, syncope or loss of  consciousness or leg swelling.  ED Course: initial troponin was negative; EKG had shown sinus bradycardia. She was given nitroglycerin. Hospitalist service was called to admit the patient.  Hospital Course:  Principal Problem:   Chest pain Active Problems:   Hypertension   Atrial fibrillation (HCC)   Warfarin anticoagulation   1. Chest pain for evaluation:  - troponin negativex3. Initial EKG showing sinus bradycardia. -Patient observation unit. 2-D echo result pending. .  - cardiology consulted, patient is transferred from [place] to [facility] for stress test, has unremarkable cardiac stress test, patient symptom has resolved, she is cleared to discharge home with close cardiology follow up. Continue asa 81mg  daily  2. History of paroxysmal atrial fibrillation on anticoagulation with Coumadin: - Currently slightly bradycardic. Continue coumadin. -Continue flecainide -cardiology follow up  3. Hypertension - Continue home hydrochlorothiazidem she is started on norvasc 5mg  daily from this hospitalization     Code Status: DO NOT RESUSCITATE Family Communication: patient Disposition Plan: Home, cleared by cardiology Consults called: Cardiology    Procedures:  Stress test on 5/30   Discharge Exam: BP (!) 152/78 (BP Location: Left Arm)   Pulse (!) 50   Temp 98.1 F (36.7 C) (Oral)   Resp (!) 22   Ht 5\' 6"  (1.676 m)   Wt 66 kg (145 lb 8.1 oz)   SpO2 98%   BMI 23.48 kg/m   General: NAD Cardiovascular: sinus bradycardia at rest heart rate around 50 Respiratory: CTABL Extremity: no edema  Discharge Instructions You were cared for by a hospitalist during your hospital stay. If you have any questions about your discharge medications or the care you received  while you were in the hospital after you are discharged, you can call the unit and asked to speak with the hospitalist on call if the hospitalist that took care of you is not available. Once you are discharged,  your primary care physician will handle any further medical issues. Please note that NO REFILLS for any discharge medications will be authorized once you are discharged, as it is imperative that you return to your primary care physician (or establish a relationship with a primary care physician if you do not have one) for your aftercare needs so that they can reassess your need for medications and monitor your lab values.  Discharge Instructions    Diet - low sodium heart healthy    Complete by:  As directed    Increase activity slowly    Complete by:  As directed      Allergies as of 06/05/2016      Reactions   Cephalexin Other (See Comments)   unknown   Codeine Other (See Comments)   unknown   Metoclopramide Hcl Other (See Comments)   unknown      Medication List    TAKE these medications   amLODipine 5 MG tablet Commonly known as:  NORVASC Take 1 tablet (5 mg total) by mouth daily. Start taking on:  06/06/2016   aspirin EC 81 MG tablet Take 1 tablet (81 mg total) by mouth daily.   CALCIUM 600 + D PO Take 1 tablet by mouth 2 (two) times daily.   flecainide 50 MG tablet Commonly known as:  TAMBOCOR Take 1 tablet (50 mg total) by mouth 2 (two) times daily.   hydrochlorothiazide 25 MG tablet Commonly known as:  HYDRODIURIL Take 1 tablet (25 mg total) by mouth daily. Please schedule appointment for refills.   ICAPS MV PO Take 1 capsule by mouth 2 (two) times daily.   nitroGLYCERIN 0.4 MG SL tablet Commonly known as:  NITROSTAT Place 1 tablet (0.4 mg total) under the tongue every 5 (five) minutes as needed for chest pain.   omeprazole 20 MG capsule Commonly known as:  PRILOSEC Take 20 mg by mouth every morning.   SYSTANE ULTRA OP Apply 1 drop to eye as needed (dry eyes).   vitamin B-12 1000 MCG tablet Commonly known as:  CYANOCOBALAMIN Take 1,000 mcg by mouth daily.   warfarin 6 MG tablet Commonly known as:  COUMADIN Take 1 tablet by mouth daily.       Allergies  Allergen Reactions  . Cephalexin Other (See Comments)    unknown  . Codeine Other (See Comments)    unknown  . Metoclopramide Hcl Other (See Comments)    unknown   Follow-up Information    Martinique, Peter M, MD Follow up on 06/25/2016.   Specialty:  Cardiology Why:  10:00AM. Will followup on echo result as outpatient Contact information: [place] [address] [place] [address] [phone]            The results of significant diagnostics from this hospitalization (including imaging, microbiology, ancillary and laboratory) are listed below for reference.    Significant Diagnostic Studies: Dg Chest 2 View  Result Date: 06/04/2016 CLINICAL DATA:  Shortness of Breath EXAM: CHEST  2 VIEW COMPARISON:  Chest radiograph May 19, 2013 and chest CT April 12, 2014 FINDINGS: There is no edema or consolidation. Heart size and pulmonary vascularity are normal. No evident adenopathy. There is aortic atherosclerosis. Postoperative changes noted in the proximal right humerus. There is mild degenerative  change in the thoracic spine. IMPRESSION: Aortic atherosclerosis.  No evident edema or consolidation. Electronically Signed   By: Lowella Grip III M.D.   On: 06/04/2016 13:25   Nm Myocar Multi W/spect W/wall Motion / Ef  Result Date: 06/05/2016  The study is normal.  This is a low risk study.  The left ventricular ejection fraction is hyperdynamic (>65%).  There was no ST segment deviation noted during stress.  Normal resting and stress perfusion. No ischemia or infarction EF 72%    Microbiology: No results found for this or any previous visit (from the past 240 hour(s)).   Labs: Basic Metabolic Panel:  Recent Labs Lab 06/04/16 1229 06/05/16 0514  NA 138  --   K 3.7  --   CL 102  --   CO2 26  --   GLUCOSE 89  --   BUN 12  --   CREATININE 0.71  --   CALCIUM 9.3  --   MG  --  2.1   Liver Function Tests: No results for input(s): AST, ALT, ALKPHOS,  BILITOT, PROT, ALBUMIN in the last 168 hours. No results for input(s): LIPASE, AMYLASE in the last 168 hours. No results for input(s): AMMONIA in the last 168 hours. CBC:  Recent Labs Lab 06/04/16 1229  WBC 7.5  HGB 14.0  HCT 42.6  MCV 90.8  PLT 229   Cardiac Enzymes:  Recent Labs Lab 06/04/16 1711 06/04/16 2224 06/05/16 0514  TROPONINI <0.03 <0.03 <0.03   BNP: BNP (last 3 results) No results for input(s): BNP in the last 8760 hours.  ProBNP (last 3 results) No results for input(s): PROBNP in the last 8760 hours.  CBG: No results for input(s): GLUCAP in the last 168 hours.     SignedFlorencia Reasons MD, PhD  [facility] 06/05/2016, 5:47 PM

## 2016-06-05 NOTE — Consult Note (Signed)
Cardiology Consult    Patient ID: Gloria [name] MRN: [id number], DOB/AGE: 07-Oct-1930   Admit date: 06/04/2016 Date of Consult: 06/05/2016  Primary Physician: Leanna Battles, MD Primary Cardiologist: Dr. Martinique Requesting Provider: Dr. Erlinda Hong  Reason for Consult: chest pain  Patient Profile    Gloria [name] has a PMH significant for atrial fibrillation, on flecainide and coumadin, HTN, HLD, hepatitis C, and GERD. She presented to [facility] via EMS with complaints of chest pressure and shortness of breath with HTN urgency.  Gloria [name] is a 83 y.o. female who is being seen today for the evaluation of chest pain at the request of Dr. Erlinda Hong.   Past Medical History   Past Medical History:  Diagnosis Date  . Arthritis    right  . Atrial fibrillation ([place])   . GERD (gastroesophageal reflux disease)    hx of  . Hepatitis C   . Humerus fracture   . Hyperlipidemia   . Hypertension   . Irritable bowel syndrome   . Meningioma ([place])   . PONV (postoperative nausea and vomiting)     Past Surgical History:  Procedure Laterality Date  . BREAST SURGERY     lumpectomy  . CARDIOVASCULAR STRESS TEST  11/14/2006   EF 67%  . COLONOSCOPY W/ BIOPSIES AND POLYPECTOMY    . ORIF HUMERUS FRACTURE Right 04/18/2014   Procedure: OPEN REDUCTION INTERNAL FIXATION (ORIF) RIGHT PROXIMAL HUMERUS FRACTURE;  Surgeon: Netta Cedars, MD;  Location: [place];  Service: Orthopedics;  Laterality: Right;  . ORIF PERIPROSTHETIC FRACTURE Left 04/30/2013   Procedure: OPEN REDUCTION INTERNAL FIXATION (ORIF) PERIPROSTHETIC FRACTURE;  Surgeon: [name], MD;  Location: [place];  Service: Orthopedics;  Laterality: Left;  . PAROTID GLAND TUMOR EXCISION    . TOTAL HIP ARTHROPLASTY  11/07   left  . TOTAL KNEE ARTHROPLASTY    . TRANSTHORACIC ECHOCARDIOGRAM  10/25/2009   EF 55-60%     Allergies  Allergies  Allergen Reactions  . Cephalexin Other (See Comments)    unknown  . Codeine Other (See Comments)    unknown  .  Metoclopramide Hcl Other (See Comments)    unknown    History of Present Illness    Gloria [name] is known to our service and last saw Dr.  Martinique in clinic on 12/26/15. At that time, she was in her usual state of health. She is compliant on her flecainide and coumadin for Afib and has been in NSR.  On my interview, she reports sudden onset of shortness of breath and chest tightness. The chest tightness was rated as a 4/10 at its worst and radiated to her back. She had accompanying shortness of breath, but denies palpitations, dizziness, lightheadedness, and near-/syncope.  She took her blood pressure at home and it was near 200/100. She called EMS and they confirmed HTN and brought her to [facility]. Upon arrival, she received nitro SL which relieved her chest tightness and shortness of breath.  Overnight, she received an additional 2 doses of SL nitro, which again relieved her chest pain. Today, she is maintaining on room air and denies current shortness of breath and chest tightness. She was active doing yard work 2 days ago and had no chest tightness, pain, or shortness of breath.  She is compliant on her home medications, including flecainide, coumadin, and HCTZ. She lost her husband last fall.  Inpatient Medications    . aspirin EC  325 mg Oral Daily  . enoxaparin (LOVENOX) injection  1 mg/kg  Subcutaneous Q12H  . flecainide  50 mg Oral BID  . hydrochlorothiazide  25 mg Oral Daily  . pantoprazole  40 mg Oral Daily  . vitamin B-12  1,000 mcg Oral Daily     Outpatient Medications    Prior to Admission medications   Medication Sig Start Date End Date Taking? Authorizing Provider  Calcium Carb-Cholecalciferol (CALCIUM 600 + D PO) Take 1 tablet by mouth 2 (two) times daily.   Yes [provider]  flecainide (TAMBOCOR) 50 MG tablet Take 1 tablet (50 mg total) by mouth 2 (two) times daily. 04/29/11  Yes Martinique, Peter M, MD  hydrochlorothiazide (HYDRODIURIL) 25 MG tablet Take 1 tablet (25  mg total) by mouth daily. Please schedule appointment for refills. 06/27/15  Yes Martinique, Peter M, MD  Multiple Vitamins-Minerals (ICAPS MV PO) Take 1 capsule by mouth 2 (two) times daily.    Yes [provider]  omeprazole (PRILOSEC) 20 MG capsule Take 20 mg by mouth every morning.    Yes [provider]  Polyethyl Glycol-Propyl Glycol (SYSTANE ULTRA OP) Apply 1 drop to eye as needed (dry eyes).   Yes [provider]  vitamin B-12 (CYANOCOBALAMIN) 1000 MCG tablet Take 1,000 mcg by mouth daily.   Yes [provider]  warfarin (COUMADIN) 6 MG tablet Take 1 tablet by mouth daily. 08/16/14  Yes [provider]     Family History     Family History  Problem Relation Age of Onset  . Heart disease Brother     Social History    Social History   Social History  . Marital status: Married    Spouse name: N/A  . Number of children: 2  . Years of education: N/A   Occupational History  .  Retired   Social History Main Topics  . Smoking status: Former Smoker    Packs/day: 0.30    Years: 15.00    Types: Cigarettes    Quit date: 06/07/1980  . Smokeless tobacco: Never Used  . Alcohol use No  . Drug use: No  . Sexual activity: No   Other Topics Concern  . Not on file   Social History Narrative  . No narrative on file     Review of Systems    General:  No chills, fever, night sweats or weight changes.  Cardiovascular:  + chest pain, no dyspnea on exertion, edema, orthopnea, palpitations, paroxysmal nocturnal dyspnea. Dermatological: No rash, lesions/masses Respiratory: No cough, dyspnea Urologic: No hematuria, dysuria Abdominal:   No nausea, vomiting, diarrhea, bright red blood per rectum, melena, or hematemesis Neurologic:  No visual changes, wkns, changes in mental status. All other systems reviewed and are otherwise negative except as noted above.  Physical Exam    Blood pressure (!) 151/75, pulse (!) 53, temperature 97.6 F (36.4  C), temperature source Axillary, resp. rate (!) 31, height 5\' 6"  (1.676 m), weight 145 lb 8.1 oz (66 kg), SpO2 100 %.  General: Pleasant, NAD Psych: Normal affect. Neuro: Alert and oriented X 3. Moves all extremities spontaneously. HEENT: Normal  Neck: Supple without bruits or JVD. Lungs:  Resp regular and unlabored, CTA. Heart: RRR no s3, s4, or murmurs. Abdomen: Soft, non-tender, non-distended, BS + x 4.  Extremities: No clubbing, cyanosis. trace edema. DP/PT/Radials 2+ and equal bilaterally.  Labs    Troponin [facility] Test)  Recent Labs  06/04/16 [place] 0.00    Recent Labs  06/04/16 1711 06/04/16 2224 06/05/16 0514  TROPONINI <0.03 <  0.03 <0.03   Lab Results  Component Value Date   WBC 7.5 06/04/2016   HGB 14.0 06/04/2016   HCT 42.6 06/04/2016   MCV 90.8 06/04/2016   PLT 229 06/04/2016    Recent Labs Lab 06/04/16 1229  NA 138  K 3.7  CL 102  CO2 26  BUN 12  CREATININE 0.71  CALCIUM 9.3  GLUCOSE 89   No results found for: CHOL, HDL, LDLCALC, TRIG No results found for: [facility]   Radiology Studies    Dg Chest 2 View  Result Date: 06/04/2016 CLINICAL DATA:  Shortness of Breath EXAM: CHEST  2 VIEW COMPARISON:  Chest radiograph May 19, 2013 and chest CT April 12, 2014 FINDINGS: There is no edema or consolidation. Heart size and pulmonary vascularity are normal. No evident adenopathy. There is aortic atherosclerosis. Postoperative changes noted in the proximal right humerus. There is mild degenerative change in the thoracic spine. IMPRESSION: Aortic atherosclerosis.  No evident edema or consolidation. Electronically Signed   By: Lowella Grip III M.D.   On: 06/04/2016 13:25    ECG & Cardiac Imaging    EKG 06/04/16: sinus rhythm, nonspecific ST-T changes in precordial leads  Echocardiogram pending  Myoview 06/30/12: Low risk stress nuclear study with a small, moderate intensity, fixed inferior and apical defect suggestive of thinning; no  ischemia. LV Ejection Fraction: 68%.  LV Wall Motion:  NL LV Function; NL Wall Motion  Assessment & Plan    1. Chest pain - troponin x 3 negative - EKG with nonspecific ST-T changes Her chest pain sounds atypical in nature and likely related to her HTN episode. Nitro relieved her chest pressure, but it also decreased her BP. Risk factors for ACS include HTN, HLD, and family history. Previous myoview in 2014 was negative for ischemia. Discussed with patient and recommend inpatient lexiscan myoview. Echocardiogram pending.   2. HTN Urgency - pressures have been labile since arrival: 120-190s / 60-70s She has been compliant on her home medications, including HCTZ for HTN. Will check a TSH. K WNL. Will also check magnesium. She states her BP has been well-controlled. May need to titrate HCTZ for better pressure control.   3. Hx of Atrial fibrillation Continue flecainide and coumadin. INR on arrival was 2.38, now 1.91. Currently in NSR.    Signed, Tami Lin Duke, PA-C 06/05/2016, 7:27 AM [phone]  As above, patient seen and examined. Briefly she is an 83 year old female with past medical history of paroxysmal atrial fibrillation, hypertension, hyperlipidemia, hepatitis C who I am asked to evaluate for chest pain. She has some dyspnea on exertion but no palpitations or orthopnea; no exertional chest pain. Yesterday "she did not feel right". She checked her blood pressure and it was elevated and ultimately increased to 200/105. She also described tightness in the epigastric area without radiation. Mild associated dyspnea; no nausea or diaphoresis. Pain increased with inspiration; duration 30 min; some improvement with NTG. Cardiology now asked to evaluate. ECG sinus bradycardia, LVH, cannot R/O septal MI\ Enzymes negative 1 Chest pain-symptoms atypical; plan nuclear study to R/O ischemia.  2 Hypertension-add amlodipine 5 mg daily and follow BP as outpt  3 PAF-remains in sinus;  continue flecanide; continue coumadin (CHADSvasc 4).  Pt can be DCed if nuclear study negative and fu with Dr Martinique  Chidera Dearcos, MD

## 2016-06-05 NOTE — Progress Notes (Signed)
  Echocardiogram 2D Echocardiogram has been performed.  Tresa Res 06/05/2016, 1:58 PM

## 2016-06-05 NOTE — Evaluation (Signed)
Physical Therapy One Time Evaluation Patient Details Name: Gloria [name] MRN: [id number] DOB: 1930/12/31 Today's Date: 06/05/2016   History of Present Illness  83 y.o. female with medical history significant of paroxysmal atrial flutter ablation on Coumadin, hypertension, dyslipidemia and irritable bowel syndrome and presented to hospital for Chest pain-symptoms atypical; per cardiology, plan nuclear study to R/O ischemia  Clinical Impression  Patient evaluated by Physical Therapy with no further acute PT needs identified. All education has been completed and the patient has no further questions.  Pt mobilizing well and has equipment at home. No follow-up Physical Therapy or equipment needs at this time. PT is signing off. Thank you for this referral.     Follow Up Recommendations No PT follow up    Equipment Recommendations  None recommended by PT    Recommendations for Other Services       Precautions / Restrictions Precautions Precautions: None      Mobility  Bed Mobility Overal bed mobility: Modified Independent                Transfers Overall transfer level: Modified independent                  Ambulation/Gait Ambulation/Gait assistance: Supervision;Modified independent (Device/Increase time) Ambulation Distance (Feet): 320 Feet Assistive device: Rolling walker (2 wheeled) Gait Pattern/deviations: Step-through pattern;Decreased stride length     General Gait Details: steady with RW, typically uses SPC however also has RW at home, no dizziness or chest pain with activity, HR 50s-70s during ambulation (mid 50s at rest)  Stairs            Wheelchair Mobility    Modified Rankin (Stroke Patients Only)       Balance Overall balance assessment: No apparent balance deficits (not formally assessed) (no falls since 2014 per pt, she also uses SPC for safety)                                           Pertinent Vitals/Pain  Pain Assessment: No/denies pain    Home Living Family/patient expects to be discharged to:: Private residence Living Arrangements: Alone   Type of Home: House Home Access: Stairs to enter Entrance Stairs-Rails: Left Entrance Stairs-Number of Steps: 1 Home Layout: One level Home Equipment: Cane - single point;Bedside commode;Grab bars - tub/shower;Shower seat;Walker - 4 wheels      Prior Function Level of Independence: Independent with assistive device(s)         Comments: uses SPC      Hand Dominance        Extremity/Trunk Assessment   Upper Extremity Assessment Upper Extremity Assessment: Overall WFL for tasks assessed    Lower Extremity Assessment Lower Extremity Assessment: Overall WFL for tasks assessed       Communication   Communication: No difficulties  Cognition Arousal/Alertness: Awake/alert Behavior During Therapy: WFL for tasks assessed/performed Overall Cognitive Status: Within Functional Limits for tasks assessed                                        General Comments      Exercises     Assessment/Plan    PT Assessment Patent does not need any further PT services  PT Problem List         PT Treatment  Interventions      PT Goals (Current goals can be found in the Care Plan section)  Acute Rehab PT Goals PT Goal Formulation: All assessment and education complete, DC therapy    Frequency     Barriers to discharge        Co-evaluation               AM-PAC PT "6 Clicks" Daily Activity  Outcome Measure Difficulty turning over in bed (including adjusting bedclothes, sheets and blankets)?: None Difficulty moving from lying on back to sitting on the side of the bed? : None Difficulty sitting down on and standing up from a chair with arms (e.g., wheelchair, bedside commode, etc,.)?: None Help needed moving to and from a bed to chair (including a wheelchair)?: None Help needed walking in hospital room?: A  Little Help needed climbing 3-5 steps with a railing? : A Little 6 Click Score: 22    End of Session Equipment Utilized During Treatment: Gait belt Activity Tolerance: Patient tolerated treatment well Patient left: in bed;with call bell/phone within reach Nurse Communication: Mobility status PT Visit Diagnosis: Difficulty in walking, not elsewhere classified (R26.2)    Time: 2094-7096 PT Time Calculation (min) (ACUTE ONLY): 9 min   Charges:   PT Evaluation $PT Eval Low Complexity: 1 Procedure     PT G Codes:   PT G-Codes **NOT FOR INPATIENT CLASS** Functional Assessment Tool Used: AM-PAC 6 Clicks Basic Mobility;Clinical judgement Functional Limitation: Mobility: Walking and moving around Mobility: Walking and Moving Around Current Status (G8366): 0 percent impaired, limited or restricted Mobility: Walking and Moving Around Goal Status (Q9476): 0 percent impaired, limited or restricted Mobility: Walking and Moving Around Discharge Status (L4650): 0 percent impaired, limited or restricted    Carmelia Bake, PT, DPT 06/05/2016 Pager: [phone]   York Ram E 06/05/2016, 12:48 PM

## 2016-06-05 NOTE — Progress Notes (Signed)
ANTICOAGULATION CONSULT NOTE - Follow Up Consult  Pharmacy Consult for Warfarin Indication: AFib   Allergies  Allergen Reactions  . Cephalexin Other (See Comments)    unknown  . Codeine Other (See Comments)    unknown  . Metoclopramide Hcl Other (See Comments)    unknown    Patient Measurements: Height: 5\' 6"  (167.6 cm) Weight: 145 lb 8.1 oz (66 kg) IBW/kg (Calculated) : 59.3  Vital Signs: Temp: 97.9 F (36.6 C) (05/30 0825) Temp Source: Oral (05/30 0825) BP: 148/64 (05/30 0825) Pulse Rate: 47 (05/30 0825)  Labs:  Recent Labs  06/04/16 1229 06/04/16 1236 06/04/16 1711 06/04/16 2224 06/05/16 0514  HGB 14.0  --   --   --   --   HCT 42.6  --   --   --   --   PLT 229  --   --   --   --   LABPROT  --  26.4*  --   --  22.2*  INR  --  2.38  --   --  1.91  CREATININE 0.71  --   --   --   --   TROPONINI  --   --  <0.03 <0.03 <0.03    Estimated Creatinine Clearance: 49 mL/min (by C-G formula based on SCr of 0.71 mg/dL).   Medical History: Past Medical History:  Diagnosis Date  . Arthritis    right  . Atrial fibrillation ([place])   . GERD (gastroesophageal reflux disease)    hx of  . Hepatitis C   . Humerus fracture   . Hyperlipidemia   . Hypertension   . Irritable bowel syndrome   . Meningioma ([place])   . PONV (postoperative nausea and vomiting)     Assessment: 83 yr female with PMH significant for AFib, HTN, hyperlipidemia with complaints of chest pressure and difficulty breathing.  Pharmacy initially consulted to dose warfarin, but to hold off on dosing until patient evaluated by cardiology  Pharmacy then consulted to dose therapeutic Lovenox for ACS/STEMI/Chest Pain.  INR drawn and resulted = 2.38 (therapeutic).  Contacted Dr Starla Link to make aware of therapeutic INR.  As patient is symptomatic, he confirmed that he did want the Lovenox to still be given   Today, 06/05/2016:  Cardiology d/c'd Lovenox and recommended to continue warfarin for PAF (CHADS-VASc  4). Will be transferred to [facility] for [facility] today with discharge later today if negative.  INR 1.91, last dose of warfarin was 5/29 at 0700. PTA dose reported as 6 mg daily.  CBC WNL  Goal of Therapy:  INR 2-3 Monitor platelets by anticoagulation protocol: Yes   Plan:   Warfarin 6 mg today.  Daily INR  Hershal Coria, PharmD 06/05/2016,12:33 PM

## 2016-06-05 NOTE — Progress Notes (Signed)
Patient began to complain of left upper chest pain and left posterior chest pain and shortness of breath. Gave patient SL NTG per order, chest pain was relieved after 2 SL NTG. EKG performed and hospitalist coverage notified. Will continue to monitor patient and evaluate troponin in the morning. Patient placed in a position of comfort, no other needs voiced at this time.

## 2016-06-06 ENCOUNTER — Encounter (HOSPITAL_COMMUNITY): Payer: Self-pay | Admitting: *Deleted

## 2016-06-06 ENCOUNTER — Telehealth: Payer: Self-pay | Admitting: *Deleted

## 2016-06-06 NOTE — Telephone Encounter (Signed)
-----   Message from [place], [place] sent at 06/06/2016 12:29 PM EDT ----- Regarding: Inform patient echo result [name] was discharged yesterday from hospital after negative nuclear stress test. Echo was not back at the time, we planned to followup on echo result as outpatient.   Echo report is back, it showed normal pumping function consistent with the pumping function suggested by stress test. Some heart wall thickening likely related to uncontrolled blood pressure. She was discharged with addition of amlodipine yesterday which should help. Plan to followup with Dr. Martinique as previously scheduled.  Gloria Corrigan PA Pager: [phone]

## 2016-06-06 NOTE — Telephone Encounter (Signed)
Results and recommendations discussed with patient, who verbalized understanding and thanks.  

## 2016-06-07 DIAGNOSIS — Z7901 Long term (current) use of anticoagulants: Secondary | ICD-10-CM | POA: Diagnosis not present

## 2016-06-07 DIAGNOSIS — R0789 Other chest pain: Secondary | ICD-10-CM | POA: Diagnosis not present

## 2016-06-07 DIAGNOSIS — I480 Paroxysmal atrial fibrillation: Secondary | ICD-10-CM | POA: Diagnosis not present

## 2016-06-14 DIAGNOSIS — H04123 Dry eye syndrome of bilateral lacrimal glands: Secondary | ICD-10-CM | POA: Diagnosis not present

## 2016-06-14 DIAGNOSIS — H353131 Nonexudative age-related macular degeneration, bilateral, early dry stage: Secondary | ICD-10-CM | POA: Diagnosis not present

## 2016-06-14 DIAGNOSIS — H01001 Unspecified blepharitis right upper eyelid: Secondary | ICD-10-CM | POA: Diagnosis not present

## 2016-06-14 DIAGNOSIS — H52203 Unspecified astigmatism, bilateral: Secondary | ICD-10-CM | POA: Diagnosis not present

## 2016-06-17 DIAGNOSIS — R0789 Other chest pain: Secondary | ICD-10-CM | POA: Diagnosis not present

## 2016-06-17 DIAGNOSIS — F4321 Adjustment disorder with depressed mood: Secondary | ICD-10-CM | POA: Diagnosis not present

## 2016-06-17 DIAGNOSIS — I480 Paroxysmal atrial fibrillation: Secondary | ICD-10-CM | POA: Diagnosis not present

## 2016-06-17 DIAGNOSIS — I10 Essential (primary) hypertension: Secondary | ICD-10-CM | POA: Diagnosis not present

## 2016-06-17 DIAGNOSIS — Z6824 Body mass index (BMI) 24.0-24.9, adult: Secondary | ICD-10-CM | POA: Diagnosis not present

## 2016-06-18 ENCOUNTER — Other Ambulatory Visit: Payer: Self-pay | Admitting: Cardiology

## 2016-06-20 NOTE — Progress Notes (Signed)
Gloria [name] Date of Birth: March 26, 1930   History of Present Illness: Gloria [name] seen today for followup paroxysmal afib. She has a history of atrial fibrillation controlled with flecainide. She is on chronic anticoagulation with coumadin. She tried Xarelto in the past but this upset her stomach.   She was admitted in May with symptoms of chest pressure and SOB. She ruled out for MI. Echo showed moderate LVH and LAE with normal systolic function. Myoview study was normal. She was placed on amlodipine for HTN. Subsequently she developed lower extremity swelling with a rash and red blotches. Amlodipine was stopped and this resolved.   On follow up today she denies any symptoms of palpitations. She denies any chest pain or SOB.   She reports INRs have been therapeutic. She has been monitoring BP at home- running [id number] systolic.   Current Outpatient Prescriptions on File Prior to Visit  Medication Sig Dispense Refill  . aspirin EC 81 MG tablet Take 1 tablet (81 mg total) by mouth daily.    . Calcium Carb-Cholecalciferol (CALCIUM 600 + D PO) Take 1 tablet by mouth 2 (two) times daily.    . flecainide (TAMBOCOR) 50 MG tablet Take 1 tablet (50 mg total) by mouth 2 (two) times daily. 180 tablet 1  . hydrochlorothiazide (HYDRODIURIL) 25 MG tablet TAKE ONE TABLET BY MOUTH ONCE DAILY 90 tablet 2  . Multiple Vitamins-Minerals (ICAPS MV PO) Take 1 capsule by mouth 2 (two) times daily.     . nitroGLYCERIN (NITROSTAT) 0.4 MG SL tablet Place 1 tablet (0.4 mg total) under the tongue every 5 (five) minutes as needed for chest pain. 30 tablet 0  . omeprazole (PRILOSEC) 20 MG capsule Take 20 mg by mouth every morning.     Vladimir Faster Glycol-Propyl Glycol (SYSTANE ULTRA OP) Apply 1 drop to eye as needed (dry eyes).    . vitamin B-12 (CYANOCOBALAMIN) 1000 MCG tablet Take 1,000 mcg by mouth daily.    Marland Kitchen warfarin (COUMADIN) 6 MG tablet Take 1 tablet by mouth daily.     No current facility-administered  medications on file prior to visit.     Allergies  Allergen Reactions  . Amlodipine Swelling  . Cephalexin Other (See Comments)    unknown  . Codeine Other (See Comments)    unknown  . Metoclopramide Hcl Other (See Comments)    unknown    Past Medical History:  Diagnosis Date  . Arthritis    right  . Atrial fibrillation ([place])   . GERD (gastroesophageal reflux disease)    hx of  . Hepatitis C   . Humerus fracture   . Hyperlipidemia   . Hypertension   . Irritable bowel syndrome   . Meningioma ([place])   . PONV (postoperative nausea and vomiting)     Past Surgical History:  Procedure Laterality Date  . BREAST SURGERY     lumpectomy  . CARDIOVASCULAR STRESS TEST  11/14/2006   EF 67%  . COLONOSCOPY W/ BIOPSIES AND POLYPECTOMY    . ORIF HUMERUS FRACTURE Right 04/18/2014   Procedure: OPEN REDUCTION INTERNAL FIXATION (ORIF) RIGHT PROXIMAL HUMERUS FRACTURE;  Surgeon: Netta Cedars, MD;  Location: [place];  Service: Orthopedics;  Laterality: Right;  . ORIF PERIPROSTHETIC FRACTURE Left 04/30/2013   Procedure: OPEN REDUCTION INTERNAL FIXATION (ORIF) PERIPROSTHETIC FRACTURE;  Surgeon: [name], MD;  Location: [place];  Service: Orthopedics;  Laterality: Left;  . PAROTID GLAND TUMOR EXCISION    . TOTAL HIP ARTHROPLASTY  11/07  left  . TOTAL KNEE ARTHROPLASTY    . TRANSTHORACIC ECHOCARDIOGRAM  10/25/2009   EF 55-60%    History  Smoking Status  . Former Smoker  . Packs/day: 0.30  . Years: 15.00  . Types: Cigarettes  . Quit date: 06/07/1980  Smokeless Tobacco  . Never Used    History  Alcohol Use No    Family History  Problem Relation Age of Onset  . Heart disease Brother     Review of Systems: As noted in history of present illness.  All other systems were reviewed and are negative.  Physical Exam: BP (!) 154/80 (BP Location: Left Arm, Cuff Size: Normal)   Pulse (!) 54   Ht 5\' 6"  (1.676 m)   Wt 145 lb 9.6 oz (66 kg)   BMI 23.50 kg/m  She is a pleasant white  female in no acute distress. Her HEENT exam is unremarkable. She has no JVD or bruits. Lungs are clear. Cardiac exam reveals a regular rate and rhythm without gallop or murmur.  Abdomen is soft and nontender without masses. She has no edema. Pedal pulses are good.  LABORATORY DATA: Lab Results  Component Value Date   WBC 7.5 06/04/2016   HGB 14.0 06/04/2016   HCT 42.6 06/04/2016   PLT 229 06/04/2016   GLUCOSE 89 06/04/2016   ALT 16 04/28/2013   AST 23 04/28/2013   NA 138 06/04/2016   K 3.7 06/04/2016   CL 102 06/04/2016   CREATININE 0.71 06/04/2016   BUN 12 06/04/2016   CO2 26 06/04/2016   TSH 2.129 06/05/2016   INR 1.91 06/05/2016   Labs dated 03/20/16: cholesterol 200, triglycerides 75, LDL 116, HDL 69. CMET is normal.  Myoview 06/05/16: Study Result     The study is normal.  This is a low risk study.  The left ventricular ejection fraction is hyperdynamic (>65%).  There was no ST segment deviation noted during stress.   Normal resting and stress perfusion. No ischemia or infarction EF 72%    Echo 06/05/16: Study Conclusions  - Left ventricle: The cavity size was normal. Wall thickness was   increased in a pattern of mild LVH. There was moderate concentric   hypertrophy. Systolic function was normal. The estimated ejection   fraction was in the range of 55% to 60%. - Left atrium: The atrium was moderately dilated. - Atrial septum: No defect or patent foramen ovale was identified.  Assessment / Plan: 1. Atrial fibrillation- paroxysmal. This is well controlled on low dose flecainide. We will continue her current therapy.   2. Chronic anticoagulation, therapeutic. Continue Coumadin.  3. Hypertension,  Not well controlled. Will add losartan 25 mg daily. Check a BMET in 3 weeks. Monitor BP at home.  4. Chest pain- resolved. Negative ischemic work up in May.  I will follow up in 6 months

## 2016-06-25 ENCOUNTER — Encounter: Payer: Self-pay | Admitting: Cardiology

## 2016-06-25 ENCOUNTER — Ambulatory Visit (INDEPENDENT_AMBULATORY_CARE_PROVIDER_SITE_OTHER): Payer: Medicare Other | Admitting: Cardiology

## 2016-06-25 VITALS — BP 154/80 | HR 54 | Ht 66.0 in | Wt 145.6 lb

## 2016-06-25 DIAGNOSIS — H6121 Impacted cerumen, right ear: Secondary | ICD-10-CM | POA: Diagnosis not present

## 2016-06-25 DIAGNOSIS — I480 Paroxysmal atrial fibrillation: Secondary | ICD-10-CM

## 2016-06-25 DIAGNOSIS — I10 Essential (primary) hypertension: Secondary | ICD-10-CM

## 2016-06-25 DIAGNOSIS — H93291 Other abnormal auditory perceptions, right ear: Secondary | ICD-10-CM | POA: Diagnosis not present

## 2016-06-25 DIAGNOSIS — E7800 Pure hypercholesterolemia, unspecified: Secondary | ICD-10-CM

## 2016-06-25 MED ORDER — LOSARTAN POTASSIUM 25 MG PO TABS: 25.0000 mg | ORAL_TABLET | Freq: Every day | ORAL | 3 refills | Status: DC

## 2016-06-25 NOTE — Patient Instructions (Signed)
Stop amlodipine and ASA  Continue your other therapy  We will add losartan 25 mg daily  We will check a kidney panel in 3 weeks.

## 2016-06-28 ENCOUNTER — Other Ambulatory Visit: Payer: Self-pay | Admitting: Internal Medicine

## 2016-06-28 DIAGNOSIS — Z1231 Encounter for screening mammogram for malignant neoplasm of breast: Secondary | ICD-10-CM

## 2016-07-02 DIAGNOSIS — R1013 Epigastric pain: Secondary | ICD-10-CM | POA: Diagnosis not present

## 2016-07-02 DIAGNOSIS — Z7901 Long term (current) use of anticoagulants: Secondary | ICD-10-CM | POA: Diagnosis not present

## 2016-07-02 DIAGNOSIS — K5900 Constipation, unspecified: Secondary | ICD-10-CM | POA: Diagnosis not present

## 2016-07-02 DIAGNOSIS — K219 Gastro-esophageal reflux disease without esophagitis: Secondary | ICD-10-CM | POA: Diagnosis not present

## 2016-07-02 DIAGNOSIS — I4891 Unspecified atrial fibrillation: Secondary | ICD-10-CM | POA: Diagnosis not present

## 2016-07-03 ENCOUNTER — Other Ambulatory Visit (HOSPITAL_COMMUNITY): Payer: Self-pay | Admitting: Gastroenterology

## 2016-07-03 DIAGNOSIS — R1013 Epigastric pain: Secondary | ICD-10-CM

## 2016-07-05 DIAGNOSIS — I480 Paroxysmal atrial fibrillation: Secondary | ICD-10-CM | POA: Diagnosis not present

## 2016-07-05 DIAGNOSIS — Z7901 Long term (current) use of anticoagulants: Secondary | ICD-10-CM | POA: Diagnosis not present

## 2016-07-12 ENCOUNTER — Telehealth: Payer: Self-pay | Admitting: Cardiology

## 2016-07-12 NOTE — Telephone Encounter (Signed)
New message   Pt c/o medication issue:  1. Name of Medication: losartan (COZAAR) 25 MG tablet  2. How are you currently taking this medication (dosage and times per day)? 25MG   3. Are you having a reaction (difficulty breathing--STAT)? Difficulty breathing and is having some acid reflux  4. What is your medication issue? Some SOB

## 2016-07-12 NOTE — Telephone Encounter (Signed)
Patient called into state that she has been having a hard time taking a deep breath since starting Losartan 25 mg. She denies edema and chest pain. She also has a problem with severe acid reflux so stated that she did not know if it was the medication or her acid reflux. Dr. Martinique made aware and advised to continue with the Losartan and call us back if the symptom does not subside.   Blood pressures have been:  134/70 HR 50 125/67 HR 55 133/62 HR 53  Patient verbalized understanding.

## 2016-07-14 ENCOUNTER — Emergency Department (HOSPITAL_COMMUNITY): Payer: Medicare Other

## 2016-07-14 ENCOUNTER — Other Ambulatory Visit (HOSPITAL_COMMUNITY): Payer: Medicare Other

## 2016-07-14 ENCOUNTER — Observation Stay (HOSPITAL_BASED_OUTPATIENT_CLINIC_OR_DEPARTMENT_OTHER): Payer: Medicare Other

## 2016-07-14 ENCOUNTER — Encounter (HOSPITAL_COMMUNITY): Payer: Self-pay | Admitting: *Deleted

## 2016-07-14 ENCOUNTER — Observation Stay (HOSPITAL_COMMUNITY): Payer: Medicare Other

## 2016-07-14 ENCOUNTER — Observation Stay (HOSPITAL_COMMUNITY)
Admission: EM | Admit: 2016-07-14 | Discharge: 2016-07-15 | Disposition: A | Discharge status: Home / Self Care | Payer: Medicare Other | Attending: Family Medicine | Admitting: Family Medicine

## 2016-07-14 DIAGNOSIS — R519 Headache, unspecified: Secondary | ICD-10-CM

## 2016-07-14 DIAGNOSIS — G451 Carotid artery syndrome (hemispheric): Secondary | ICD-10-CM

## 2016-07-14 DIAGNOSIS — I10 Essential (primary) hypertension: Secondary | ICD-10-CM | POA: Diagnosis not present

## 2016-07-14 DIAGNOSIS — R51 Headache: Secondary | ICD-10-CM

## 2016-07-14 DIAGNOSIS — Z87891 Personal history of nicotine dependence: Secondary | ICD-10-CM | POA: Diagnosis not present

## 2016-07-14 DIAGNOSIS — Z7901 Long term (current) use of anticoagulants: Secondary | ICD-10-CM | POA: Insufficient documentation

## 2016-07-14 DIAGNOSIS — Z7982 Long term (current) use of aspirin: Secondary | ICD-10-CM | POA: Diagnosis not present

## 2016-07-14 DIAGNOSIS — I4891 Unspecified atrial fibrillation: Secondary | ICD-10-CM | POA: Diagnosis present

## 2016-07-14 DIAGNOSIS — M6281 Muscle weakness (generalized): Secondary | ICD-10-CM | POA: Diagnosis not present

## 2016-07-14 DIAGNOSIS — I480 Paroxysmal atrial fibrillation: Secondary | ICD-10-CM

## 2016-07-14 DIAGNOSIS — Z79899 Other long term (current) drug therapy: Secondary | ICD-10-CM | POA: Diagnosis not present

## 2016-07-14 DIAGNOSIS — Z66 Do not resuscitate: Secondary | ICD-10-CM | POA: Diagnosis not present

## 2016-07-14 DIAGNOSIS — K580 Irritable bowel syndrome with diarrhea: Secondary | ICD-10-CM | POA: Insufficient documentation

## 2016-07-14 DIAGNOSIS — K219 Gastro-esophageal reflux disease without esophagitis: Secondary | ICD-10-CM | POA: Insufficient documentation

## 2016-07-14 DIAGNOSIS — R531 Weakness: Secondary | ICD-10-CM

## 2016-07-14 DIAGNOSIS — I63412 Cerebral infarction due to embolism of left middle cerebral artery: Secondary | ICD-10-CM | POA: Diagnosis not present

## 2016-07-14 DIAGNOSIS — E785 Hyperlipidemia, unspecified: Secondary | ICD-10-CM | POA: Insufficient documentation

## 2016-07-14 DIAGNOSIS — R52 Pain, unspecified: Secondary | ICD-10-CM | POA: Diagnosis not present

## 2016-07-14 DIAGNOSIS — G459 Transient cerebral ischemic attack, unspecified: Secondary | ICD-10-CM | POA: Diagnosis not present

## 2016-07-14 DIAGNOSIS — Z8673 Personal history of transient ischemic attack (TIA), and cerebral infarction without residual deficits: Secondary | ICD-10-CM | POA: Diagnosis not present

## 2016-07-14 DIAGNOSIS — M1990 Unspecified osteoarthritis, unspecified site: Secondary | ICD-10-CM | POA: Insufficient documentation

## 2016-07-14 DIAGNOSIS — G458 Other transient cerebral ischemic attacks and related syndromes: Secondary | ICD-10-CM | POA: Diagnosis not present

## 2016-07-14 DIAGNOSIS — B1920 Unspecified viral hepatitis C without hepatic coma: Secondary | ICD-10-CM | POA: Insufficient documentation

## 2016-07-14 DIAGNOSIS — I482 Chronic atrial fibrillation: Secondary | ICD-10-CM

## 2016-07-14 DIAGNOSIS — R29810 Facial weakness: Secondary | ICD-10-CM | POA: Diagnosis not present

## 2016-07-14 DIAGNOSIS — R29818 Other symptoms and signs involving the nervous system: Secondary | ICD-10-CM | POA: Diagnosis not present

## 2016-07-14 DIAGNOSIS — G4489 Other headache syndrome: Secondary | ICD-10-CM | POA: Diagnosis not present

## 2016-07-14 LAB — I-STAT CHEM 8, ED
BUN: 12 mg/dL (ref 6–20)
Calcium, Ion: 1.1 mmol/L — ABNORMAL LOW (ref 1.15–1.40)
Chloride: 101 mmol/L (ref 101–111)
Creatinine, Ser: 0.7 mg/dL (ref 0.44–1.00)
Glucose, Bld: 105 mg/dL — ABNORMAL HIGH (ref 65–99)
HCT: 42 % (ref 36.0–46.0)
Hemoglobin: 14.3 g/dL (ref 12.0–15.0)
Potassium: 3.5 mmol/L (ref 3.5–5.1)
Sodium: 139 mmol/L (ref 135–145)
TCO2: 26 mmol/L (ref 0–100)

## 2016-07-14 LAB — PROTIME-INR
INR: 2.58
Prothrombin Time: 28.1 seconds — ABNORMAL HIGH (ref 11.4–15.2)

## 2016-07-14 LAB — COMPREHENSIVE METABOLIC PANEL
ALT: 17 U/L (ref 14–54)
AST: 25 U/L (ref 15–41)
Albumin: 4 g/dL (ref 3.5–5.0)
Alkaline Phosphatase: 92 U/L (ref 38–126)
Anion gap: 10 (ref 5–15)
BUN: 11 mg/dL (ref 6–20)
CO2: 25 mmol/L (ref 22–32)
Calcium: 9.4 mg/dL (ref 8.9–10.3)
Chloride: 102 mmol/L (ref 101–111)
Creatinine, Ser: 0.8 mg/dL (ref 0.44–1.00)
GFR calc Af Amer: >60 mL/min (ref >60)
GFR calc non Af Amer: >60 mL/min (ref >60)
Glucose, Bld: 110 mg/dL — ABNORMAL HIGH (ref 65–99)
Potassium: 3.5 mmol/L (ref 3.5–5.1)
Sodium: 137 mmol/L (ref 135–145)
Total Bilirubin: 0.9 mg/dL (ref 0.3–1.2)
Total Protein: 6.3 g/dL — ABNORMAL LOW (ref 6.5–8.1)

## 2016-07-14 LAB — DIFFERENTIAL
Basophils Absolute: 0 10*3/uL (ref 0.0–0.1)
Basophils Relative: 1 %
Eosinophils Absolute: 0.2 10*3/uL (ref 0.0–0.7)
Eosinophils Relative: 2 %
Lymphocytes Relative: 34 %
Lymphs Abs: 2.2 10*3/uL (ref 0.7–4.0)
Monocytes Absolute: 0.7 10*3/uL (ref 0.1–1.0)
Monocytes Relative: 10 %
Neutro Abs: 3.6 10*3/uL (ref 1.7–7.7)
Neutrophils Relative %: 53 %

## 2016-07-14 LAB — CBG MONITORING, ED: Glucose-Capillary: 101 mg/dL — ABNORMAL HIGH (ref 65–99)

## 2016-07-14 LAB — APTT: aPTT: 33 seconds (ref 24–36)

## 2016-07-14 LAB — CBC
HCT: 42.1 % (ref 36.0–46.0)
Hemoglobin: 13.8 g/dL (ref 12.0–15.0)
MCH: 29.9 pg (ref 26.0–34.0)
MCHC: 32.8 g/dL (ref 30.0–36.0)
MCV: 91.1 fL (ref 78.0–100.0)
Platelets: 211 10*3/uL (ref 150–400)
RBC: 4.62 MIL/uL (ref 3.87–5.11)
RDW: 14.2 % (ref 11.5–15.5)
WBC: 6.7 10*3/uL (ref 4.0–10.5)

## 2016-07-14 LAB — LIPID PANEL
Cholesterol: 178 mg/dL (ref 0–200)
HDL: 58 mg/dL (ref >40)
LDL Cholesterol: 106 mg/dL — ABNORMAL HIGH (ref 0–99)
Total CHOL/HDL Ratio: 3.1 RATIO
Triglycerides: 69 mg/dL (ref <150)
VLDL: 14 mg/dL (ref 0–40)

## 2016-07-14 LAB — I-STAT TROPONIN, ED: Troponin i, poc: 0.01 ng/mL (ref 0.00–0.08)

## 2016-07-14 LAB — SEDIMENTATION RATE: Sed Rate: 2 mm/hr (ref 0–22)

## 2016-07-14 MED ORDER — ACETAMINOPHEN 325 MG PO TABS: 650.0000 mg | ORAL_TABLET | ORAL | Status: DC | PRN

## 2016-07-14 MED ORDER — WARFARIN SODIUM 3 MG PO TABS: 3.0000 mg | ORAL_TABLET | ORAL | Status: DC

## 2016-07-14 MED ORDER — ONDANSETRON HCL 4 MG/2ML IJ SOLN
4.0000 mg | Freq: Four times a day (QID) | INTRAMUSCULAR | Status: DC | PRN
  Administered 2016-07-14: 4 mg via INTRAVENOUS
  Filled 2016-07-14: qty 2

## 2016-07-14 MED ORDER — PROCHLORPERAZINE EDISYLATE 5 MG/ML IJ SOLN
5.0000 mg | Freq: Once | INTRAMUSCULAR | Status: AC
  Administered 2016-07-14: 5 mg via INTRAVENOUS
  Filled 2016-07-14: qty 2

## 2016-07-14 MED ORDER — WARFARIN - PHARMACIST DOSING INPATIENT
Freq: Every day | Status: DC
  Administered 2016-07-14: 19:00:00

## 2016-07-14 MED ORDER — SODIUM CHLORIDE 0.9 % IV BOLUS (SEPSIS)
500.0000 mL | Freq: Once | INTRAVENOUS | Status: AC
  Administered 2016-07-14: 500 mL via INTRAVENOUS

## 2016-07-14 MED ORDER — LOSARTAN POTASSIUM 50 MG PO TABS
25.0000 mg | ORAL_TABLET | Freq: Every day | ORAL | Status: DC
  Administered 2016-07-15 (×2): 25 mg via ORAL
  Filled 2016-07-14 (×3): qty 1

## 2016-07-14 MED ORDER — ACETAMINOPHEN 160 MG/5ML PO SOLN: 650.0000 mg | ORAL | Status: DC | PRN

## 2016-07-14 MED ORDER — STROKE: EARLY STAGES OF RECOVERY BOOK
Freq: Once | Status: AC
  Administered 2016-07-14: 05:00:00
  Filled 2016-07-14: qty 1

## 2016-07-14 MED ORDER — FLECAINIDE ACETATE 50 MG PO TABS
50.0000 mg | ORAL_TABLET | Freq: Two times a day (BID) | ORAL | Status: DC
  Administered 2016-07-14 – 2016-07-15 (×3): 50 mg via ORAL
  Filled 2016-07-14 (×3): qty 1

## 2016-07-14 MED ORDER — DIPHENHYDRAMINE HCL 50 MG/ML IJ SOLN
12.5000 mg | Freq: Once | INTRAMUSCULAR | Status: AC
  Administered 2016-07-14: 12.5 mg via INTRAVENOUS
  Filled 2016-07-14: qty 1

## 2016-07-14 MED ORDER — HYDROCHLOROTHIAZIDE 25 MG PO TABS
25.0000 mg | ORAL_TABLET | Freq: Every day | ORAL | Status: DC
  Administered 2016-07-14 – 2016-07-15 (×2): 25 mg via ORAL
  Filled 2016-07-14 (×2): qty 1

## 2016-07-14 MED ORDER — ASPIRIN 325 MG PO TABS: 325.0000 mg | ORAL_TABLET | Freq: Every day | ORAL | Status: DC

## 2016-07-14 MED ORDER — ASPIRIN 300 MG RE SUPP: 300.0000 mg | Freq: Every day | RECTAL | Status: DC

## 2016-07-14 MED ORDER — ATORVASTATIN CALCIUM 40 MG PO TABS
40.0000 mg | ORAL_TABLET | Freq: Every day | ORAL | Status: DC
  Administered 2016-07-14: 40 mg via ORAL
  Filled 2016-07-14: qty 1

## 2016-07-14 MED ORDER — ACETAMINOPHEN 650 MG RE SUPP: 650.0000 mg | RECTAL | Status: DC | PRN

## 2016-07-14 MED ORDER — WARFARIN SODIUM 6 MG PO TABS
6.0000 mg | ORAL_TABLET | ORAL | Status: DC
  Administered 2016-07-14: 6 mg via ORAL
  Filled 2016-07-14 (×2): qty 1

## 2016-07-14 MED ORDER — PANTOPRAZOLE SODIUM 40 MG PO TBEC
40.0000 mg | DELAYED_RELEASE_TABLET | Freq: Every day | ORAL | Status: DC
Start: 2016-07-14 — End: 2016-07-15
  Administered 2016-07-14 – 2016-07-15 (×2): 40 mg via ORAL
  Filled 2016-07-14 (×2): qty 1

## 2016-07-14 NOTE — Progress Notes (Signed)
Patient arrived from the emergency room via stretcher, IV in LAC and LH saline locked.  Alert and oriented x 4, pain rated 2/10 in head, skin intact.  Patient oriented to room, placed on tele-box 25, call bell within reach. Patient  Past Medical History:  Diagnosis Date  . Arthritis    right  . Atrial fibrillation ([place])   . GERD (gastroesophageal reflux disease)    hx of  . Hepatitis C   . Humerus fracture   . Hyperlipidemia   . Hypertension   . Irritable bowel syndrome   . Meningioma ([place])   . PONV (postoperative nausea and vomiting)    Stroke book given to patient, neuro checks completed, awaiting MRI and 2d-echo.  Will continue to monitor. Wilson Singer, RN

## 2016-07-14 NOTE — Progress Notes (Signed)
ANTICOAGULATION CONSULT NOTE - Initial Consult  Pharmacy Consult for Warfarin  Indication: atrial fibrillation  Allergies  Allergen Reactions  . Amlodipine Swelling  . Cephalexin Other (See Comments)    unknown  . Codeine Other (See Comments)    unknown  . Metoclopramide Hcl Other (See Comments)    unknown    Vital Signs: Temp: 97.7 F (36.5 C) (07/08 0429) Temp Source: Oral (07/08 0429) BP: 158/56 (07/08 0429) Pulse Rate: 59 (07/08 0429)  Labs:  Recent Labs  07/14/16 0230 07/14/16 0238  HGB 13.8 14.3  HCT 42.1 42.0  PLT 211  --   APTT 33  --   LABPROT 28.1*  --   INR 2.58  --   CREATININE 0.80 0.70    CrCl cannot be calculated (Unknown ideal weight.).   Medical History: Past Medical History:  Diagnosis Date  . Arthritis    right  . Atrial fibrillation ([place])   . GERD (gastroesophageal reflux disease)    hx of  . Hepatitis C   . Humerus fracture   . Hyperlipidemia   . Hypertension   . Irritable bowel syndrome   . Meningioma ([place])   . PONV (postoperative nausea and vomiting)     Assessment: 83 y/o F brought in as CODE STROKE with R sided weakness/facial droop and HA. On warfarin PTA for afib, continuing. CBC good. INR on admit is therapeutic at 2.58.   PTA warfarin dosing: 3 mg on Friday, 6 mg all other days  Goal of Therapy:  Heparin level 0.3-0.7 units/ml Monitor platelets by anticoagulation protocol: Yes   Plan:  Warfarin 3 mg on Fridays, 6 mg all other days, adjust dose as needed Daily PT/INR Monitor for bleeding   Narda Bonds 07/14/2016,4:43 AM

## 2016-07-14 NOTE — Progress Notes (Addendum)
STROKE TEAM PROGRESS NOTE   HISTORY OF PRESENT ILLNESS (per record) Gloria [name] is an 83 y.o. female with a past medical history that is pertinent for HTN, HLD, a fib on coumadin, meningioma occipital pole, hep C, parotid gland tumor removal, brought in as a code stroke due to acute onset HA with R sided weakness and R face droop. She endorses having mild HA for the past couple of days and said that she went to bed last night feeling well but woke up at 1:30 am with severe HA and weakness R side as well as R face droop. EMS report nitial BP 204/78. Denies slurred speech, vertigo, double vision, focal numbness, language or vision impairment. No recent head or neck trauma. Her R sided weakness resolved while in the CT suite. Remains complaining of HA. STAT head CT showed no acute abnormality. INR 2.5   Date last known well: 07/13/16 Time last known well: 2200 tPA Given: no, marked improvement, coumadin with INR 2.5 NIHSS: 2 MRS: 0   SUBJECTIVE (INTERVAL HISTORY) No family members present. The patient reports that her headache is now gone.She complained of sudden onset of headaches on the vertex with some left face and neck numbness. She denies prior history of migraines or similar episodes. She has history of atrial fibrillation and is on warfarin and INR was 2.58 on admission   OBJECTIVE Temp:  [97.7 F (36.5 C)-98.9 F (37.2 C)] 97.7 F (36.5 C) (07/08 0559) Pulse Rate:  [52-60] 60 (07/08 0559) Cardiac Rhythm: Sinus bradycardia (07/08 0700) Resp:  [16-21] 18 (07/08 0559) BP: (147-187)/(49-67) 147/49 (07/08 0559) SpO2:  [97 %-100 %] 97 % (07/08 0559) Weight:  [66 kg (145 lb 8.1 oz)] 66 kg (145 lb 8.1 oz) (07/08 0502)  CBC:  Recent Labs Lab 07/14/16 0230 07/14/16 0238  WBC 6.7  --   NEUTROABS 3.6  --   HGB 13.8 14.3  HCT 42.1 42.0  MCV 91.1  --   PLT 211  --     Basic Metabolic Panel:  Recent Labs Lab 07/14/16 0230 07/14/16 0238  NA 137 139  K 3.5 3.5  CL 102  101  CO2 25  --   GLUCOSE 110* 105*  BUN 11 12  CREATININE 0.80 0.70  CALCIUM 9.4  --     Lipid Panel:    Component Value Date/Time   CHOL 178 07/14/2016 0536   TRIG 69 07/14/2016 0536   HDL 58 07/14/2016 0536   CHOLHDL 3.1 07/14/2016 0536   VLDL 14 07/14/2016 0536   LDLCALC 106 (H) 07/14/2016 0536   HgbA1c: No results found for: HGBA1C Urine Drug Screen: No results found for: LABOPIA, COCAINSCRNUR, LABBENZ, AMPHETMU, THCU, LABBARB  Alcohol Level No results found for: [place] Head Code Stroke W/o Cm 07/14/2016 1. No acute intracranial infarct or other process identified.  2. ASPECTS is 10.  3. 15 x 17 mm calcified meningioma at the parasagittal right occipital lobe without associated mass effect.  4. Small remote lacunar infarct involving the right caudate head.  5. Mild age-related cerebral atrophy with chronic small vessel ischemic disease, progressed relative to 2015.    Mr Gloria [name] Head/brain Wo Cm 07/14/2016 1. No acute intracranial finding, including infarct. Negative intracranial MRA.  2. Right mastoid opacification that is new from 2015. Negative nasopharynx.  3. Chronic small vessel ischemia with mild progression since 2011.  4. 15 mm right occipital calcified meningioma.    PHYSICAL EXAM Pleasant elderly Caucasian lady currently  not in distress.  Afebrile. Head is nontraumatic. Neck is supple without bruit.    Cardiac exam no murmur or gallop. Lungs are clear to auscultation. Distal pulses are well felt.  Neurological Exam ;  Awake  Alert oriented x 3. Normal speech and language.eye movements full without nystagmus.fundi were not visualized. Vision acuity and fields appear normal. Hearing is normal. Palatal movements are normal. Face symmetric. Tongue midline. Normal strength, tone, reflexes and coordination. Normal sensation. Gait deferred.      ASSESSMENT/PLAN Ms. Gloria [name] is a 83 y.o. female with history of HTN, HLD, a fib on coumadin,  meningioma occipital pole, hep C, parotid gland tumor removal,  presenting with acute onset HA with R sided weakness R face droop, BP 204/78 and INR 2.5  She did not receive IV t-PA due to coumadin therapy with INR 2.5.  Possible complex migraine vs TIA: She has history of A. fib and is on warfarin and INR was optimal  Resultant  No deficits  CT head - Small remote lacunar infarct right caudate head. Calcified meningioma.  MRI head - no acute findings.  MRA head - negative  Carotid Doppler - 1-39% ICA plaquing. Vertebral artery flow is antegrade.   2D Echo - 06/05/2016 - EF 55-60%. No cardiac source of emboli identified.  LDL - 106  HgbA1c - pending  VTE prophylaxis - Warfarin  Diet Heart Room service appropriate? Yes; Fluid consistency: Thin  warfarin daily prior to admission, now on warfarin daily  Patient counseled to be compliant with her antithrombotic medications  Ongoing aggressive stroke risk factor management  Therapy recommendations: Home health OT recommended. PT evaluation pending.  Disposition: Pending  Hypertension  Stable  Permissive hypertension (OK if < 220/120) but gradually normalize in 5-7 days  Long-term BP goal normotensive  Hyperlipidemia  Home meds:  No statin PTA  LDL 106, goal < 70  Now on Lipitor 40 mg daily  Continue statin at discharge    Other Stroke Risk Factors  Advanced age  Former smoker - quit 36 years ago.  Hx stroke/TIA by imaging   Other Active Problems  Bradycardia 40s - 50s  Incidental calcified small meningioma   PLAN  Check ESR secondary to headache.  Hospital day # 0  Mikey Bussing PA-C [facility] Pager [phone] 07/14/2016, 12:32 PM I have personally examined this patient, reviewed notes, independently viewed imaging studies, participated in medical decision making and plan of care.ROS completed by me personally and pertinent positives fully documented  I have made any additions  or clarifications directly to the above note. Agree with note above. She presented with transient episode of new onset headache with left hemifacial numbness which has resolved. Etiology is indeterminate and brain imaging is negative for acute stroke but she does have a remote age lacunar infarct and has chronic atrial fibrillation and is on warfarin and INR was optimal.. Recommend check ESR.   Continue warfarin for stroke prevention and aggressive risk factor modification. Greater than 50% time during this 25 minute visit was spent on counseling and coordination of care about her headache, stroke prevention discussion and answering questions. She also has an incidental calcified meningioma which needs conservative follow-up  Antony Contras, [place] Pager: [phone] 07/14/2016 12:39 PM   To contact Stroke Continuity provider, please refer to [email or web address] After hours, contact General Neurology

## 2016-07-14 NOTE — ED Provider Notes (Signed)
[facility] DEPT Provider Note   CSN: [id number] Arrival date & time: 07/14/16  0230     History   Chief Complaint No chief complaint on file.   HPI Gloria [name] is a 83 y.o. female.  The history is provided by the patient and medical records.    83 year old female with history of arthritis, A. fib on Coumadin, hepatitis C, hyperlipidemia, hypertension, IBS, history of meningioma, presenting to the ED as a code stroke.  Patient went to bed at 10 PM and awoke about an hour prior to arrival with severe headache on the left side of her head with radiation into the left side of the neck. Patient was noted to have right-sided weakness, slight right facial droop, and difficulty walking on EMS arrival. She also had some slight tremors in her right hand. She has no history of TIA or stroke in the past. No history of migraines or significant headaches in the past.    Past Medical History:  Diagnosis Date  . Arthritis    right  . Atrial fibrillation ([place])   . GERD (gastroesophageal reflux disease)    hx of  . Hepatitis C   . Humerus fracture   . Hyperlipidemia   . Hypertension   . Irritable bowel syndrome   . Meningioma ([place])   . PONV (postoperative nausea and vomiting)     Patient Active Problem List   Diagnosis Date Noted  . Chest pain 06/04/2016  . Shoulder fracture 04/18/2014  . Closed left hip fracture ([place]) 04/28/2013  . Hip fracture ([place]) 04/28/2013  . Dyspnea on exertion 06/24/2012  . Nausea & vomiting 04/09/2011    Class: Acute  . Diarrhea 04/09/2011    Class: Acute  . Hypokalemia 04/09/2011    Class: Acute  . Gastroparesis 04/09/2011    Class: Chronic  . Warfarin anticoagulation 04/09/2011    Class: Chronic  . Hypertension   . Hyperlipidemia   . Irritable bowel syndrome   . Atrial fibrillation [facility])     Past Surgical History:  Procedure Laterality Date  . BREAST SURGERY     lumpectomy  . CARDIOVASCULAR STRESS TEST  11/14/2006   EF 67%  .  COLONOSCOPY W/ BIOPSIES AND POLYPECTOMY    . ORIF HUMERUS FRACTURE Right 04/18/2014   Procedure: OPEN REDUCTION INTERNAL FIXATION (ORIF) RIGHT PROXIMAL HUMERUS FRACTURE;  Surgeon: Netta Cedars, MD;  Location: [facility];  Service: Orthopedics;  Laterality: Right;  . ORIF PERIPROSTHETIC FRACTURE Left 04/30/2013   Procedure: OPEN REDUCTION INTERNAL FIXATION (ORIF) PERIPROSTHETIC FRACTURE;  Surgeon: [name], MD;  Location: [facility];  Service: Orthopedics;  Laterality: Left;  . PAROTID GLAND TUMOR EXCISION    . TOTAL HIP ARTHROPLASTY  11/07   left  . TOTAL KNEE ARTHROPLASTY    . TRANSTHORACIC ECHOCARDIOGRAM  10/25/2009   EF 55-60%    OB History    No data available       Home Medications    Prior to Admission medications   Medication Sig Start Date End Date Taking? Authorizing Provider  aspirin EC 81 MG tablet Take 1 tablet (81 mg total) by mouth daily. 06/05/16 06/05/17  Almyra Deforest, PA  Calcium Carb-Cholecalciferol (CALCIUM 600 + D PO) Take 1 tablet by mouth 2 (two) times daily.    [provider]  flecainide (TAMBOCOR) 50 MG tablet Take 1 tablet (50 mg total) by mouth 2 (two) times daily. 04/29/11   Martinique, Peter M, MD  hydrochlorothiazide (HYDRODIURIL) 25 MG tablet TAKE ONE TABLET  BY MOUTH ONCE DAILY 06/18/16   Martinique, Peter M, MD  losartan (COZAAR) 25 MG tablet Take 1 tablet (25 mg total) by mouth daily. 06/25/16 06/20/17  Martinique, Peter M, MD  Multiple Vitamins-Minerals (ICAPS MV PO) Take 1 capsule by mouth 2 (two) times daily.     [provider]  nitroGLYCERIN (NITROSTAT) 0.4 MG SL tablet Place 1 tablet (0.4 mg total) under the tongue every 5 (five) minutes as needed for chest pain. 06/05/16   Florencia Reasons, MD  omeprazole (PRILOSEC) 20 MG capsule Take 20 mg by mouth every morning.     [provider]  Polyethyl Glycol-Propyl Glycol (SYSTANE ULTRA OP) Apply 1 drop to eye as needed (dry eyes).    [provider]  vitamin B-12 (CYANOCOBALAMIN) 1000 MCG tablet Take  1,000 mcg by mouth daily.    [provider]  warfarin (COUMADIN) 6 MG tablet Take 1 tablet by mouth daily. 08/16/14   [provider]    Family History Family History  Problem Relation Age of Onset  . Heart disease Brother     Social History Social History  Substance Use Topics  . Smoking status: Former Smoker    Packs/day: 0.30    Years: 15.00    Types: Cigarettes    Quit date: 06/07/1980  . Smokeless tobacco: Never Used  . Alcohol use No     Allergies   Amlodipine; Cephalexin; Codeine; and Metoclopramide hcl   Review of Systems Review of Systems  Neurological: Positive for headaches.  All other systems reviewed and are negative.    Physical Exam Updated Vital Signs BP (!) 147/49 (BP Location: Left Arm)   Pulse 60   Temp 97.7 F (36.5 C) (Oral)   Resp 18   Ht 5\' 6"  (1.676 m)   Wt 66 kg (145 lb 8.1 oz)   SpO2 97%   BMI 23.48 kg/m   Physical Exam  Constitutional: She is oriented to person, place, and time. She appears well-developed and well-nourished.  HENT:  Head: Normocephalic and atraumatic.  Mouth/Throat: Oropharynx is clear and moist.  Eyes: Conjunctivae and EOM are normal. Pupils are equal, round, and reactive to light.  Neck: Normal range of motion.  Cardiovascular: Normal rate, regular rhythm and normal heart sounds.   Pulmonary/Chest: Effort normal and breath sounds normal. No respiratory distress. She has no wheezes.  Abdominal: Soft. Bowel sounds are normal. There is no tenderness. There is no rebound.  Musculoskeletal: Normal range of motion.  Neurological: She is alert and oriented to person, place, and time.  AAOx3, answering questions and following commands appropriately; slight weakness of right grip compared with left; normal strength of both legs; CN grossly intact; moves all extremities appropriately without ataxia; no focal neuro deficits; mild facial asymmetry along the nasolabial fold  Skin: Skin is warm and dry.    Psychiatric: She has a normal mood and affect.  Nursing note and vitals reviewed.    ED Treatments / Results  Labs (all labs ordered are listed, but only abnormal results are displayed) Labs Reviewed  PROTIME-INR - Abnormal; Notable for the following:       Result Value   Prothrombin Time 28.1 (*)    All other components within normal limits  COMPREHENSIVE METABOLIC PANEL - Abnormal; Notable for the following:    Glucose, Bld 110 (*)    Total Protein 6.3 (*)    All other components within normal limits  CBG MONITORING, ED - Abnormal; Notable for the following:  Glucose-Capillary 101 (*)    All other components within normal limits  I-STAT CHEM 8, ED - Abnormal; Notable for the following:    Glucose, Bld 105 (*)    Calcium, Ion 1.10 (*)    All other components within normal limits  APTT  CBC  DIFFERENTIAL  HEMOGLOBIN A1C  LIPID PANEL  I-STAT TROPOININ, ED    EKG  EKG Interpretation None       Radiology Ct Head Code Stroke W/o Cm  Result Date: 07/14/2016 CLINICAL DATA:  Code stroke. Initial evaluation for acute right-sided weakness, facial droop. EXAM: CT HEAD WITHOUT CONTRAST TECHNIQUE: Contiguous axial images were obtained from the base of the skull through the vertex without intravenous contrast. COMPARISON:  Prior CT from 04/28/2013. FINDINGS: Brain: Generalized cerebral atrophy with chronic microvascular ischemic disease, mildly progressed from previous. Small remote lacunar infarct present within the right basal ganglia. No acute intracranial hemorrhage. No evidence for acute large vessel territory infarct. Calcified meningioma at the parasagittal right occipital lobe measures 15 x 17 mm. No associated edema or mass effect. No midline shift. No hydrocephalus. No extra-axial fluid collection. Vascular: No asymmetric hyperdense vessel. Scattered vascular calcifications noted within the carotid siphons. Skull: Scalp soft tissues and calvarium within normal limits.  Sinuses/Orbits: Globes and orbital soft tissues within normal limits. Patient status post lens extraction bilaterally. Paranasal sinuses are clear. Trace right mastoid effusion noted. Left mastoid air cells are clear. ASPECTS [facility] Stroke Program Early CT Score) - Ganglionic level infarction (caudate, lentiform nuclei, internal capsule, insula, M1-M3 cortex): 7 - Supraganglionic infarction (M4-M6 cortex): 3 Total score (0-10 with 10 being normal): 10 IMPRESSION: 1. No acute intracranial infarct or other process identified. 2. ASPECTS is 10. 3. 15 x 17 mm calcified meningioma at the parasagittal right occipital lobe without associated mass effect. 4. Small remote lacunar infarct involving the right caudate head. 5. Mild age-related cerebral atrophy with chronic small vessel ischemic disease, progressed relative to 2015. Critical Value/emergent results were called by telephone at the time of interpretation on 07/14/2016 at 2:50 am to Dr. Armida Sans , who verbally acknowledged these results. Electronically Signed   By: Jeannine Boga M.D.   On: 07/14/2016 03:05    Procedures Procedures (including critical care time)  Medications Ordered in ED Medications - No data to display   Initial Impression / Assessment and Plan / ED Course  I have reviewed the triage vital signs and the nursing notes.  Pertinent labs & imaging results that were available during my care of the patient were reviewed by me and considered in my medical decision making (see chart for details).  83 year old female presenting to the ED as a code stroke. Last known well 10 PM, woke up around 1:30 AM with severe headache, left side. Initially had some right-sided weakness and trouble walking with EMS. Symptoms improving upon arrival to the ED. She is awake, alert, appropriately oriented. Speech clear, goal oriented. NIH score of 2.  Initial head CT negative for acute findings. She has a stable right-sided meningioma.  Given improvement  of her symptoms, not a candidate for any acute intervention.  Neurology recommends admission for TIA workup, routine MRI.  Labs overall reassuring.  INR therapeutic.  Patient admitted to medicine service for ongoing care.  Headache improved with migraine cocktail.  Final Clinical Impressions(s) / ED Diagnoses   Final diagnoses:  Nonintractable headache, unspecified chronicity pattern, unspecified headache type  Weakness of right side of body    New Prescriptions Current Discharge Medication  List       Larene Pickett, PA-C 07/14/16 [id number]    Varney Biles, MD 07/14/16 2312

## 2016-07-14 NOTE — Evaluation (Signed)
Occupational Therapy Evaluation Patient Details Name: Gloria [name] MRN: [id number] DOB: 1930/03/26 Today's Date: 07/14/2016    History of Present Illness Pt is an 83 y.o. female who presented to the ED with severe L sided headache. EMS found BP to be 200/100. Pt has a history of R facial droop and R sided weakness following multiple surgeries. PMH significant for A-fib, HTN, R sided arthritis, GERD, hepatits C, humerus fracture, hyperlipidemia, hypertension, irritable bowel syndroms, meningioma, and postoperative nausea and vomiting. CT revealing 15x17 mm calcified meningioma at parasagittal R occipital lobe and small remote lacunar infarct involving R caudate head. MRI pending.   Clinical Impression   PTA, pt was independent with single point cane for basic and instrumental ADL tasks. She reports using cane for community mobility but ambulating without assistive devices in her home. She currently requires supervision to min assist for basic ADL and functional mobility in hospital setting. Pt presents with decreased dynamic standing balance, reports of blurry vision at times, and decreased activity tolerance for ADL limiting her ability to participate at [facility]. She additionally presents with decreased functional use of R UE at baseline due to previous humerus fracture. She would benefit from continued OT services while admitted to improve independence with ADL and functional mobility in preparation for return home with home health OT services and intermittent assistance from her family. Will continue to follow.      Follow Up Recommendations  Home health OT;Supervision - Intermittent    Equipment Recommendations  None recommended by OT    Recommendations for Other Services       Precautions / Restrictions Precautions Precautions: Fall Precaution Comments: Pt unsteady Restrictions Weight Bearing Restrictions: No      Mobility Bed Mobility Overal bed mobility: Needs Assistance Bed  Mobility: Supine to Sit;Sit to Supine     Supine to sit: Min guard Sit to supine: Min guard   General bed mobility comments: No physical assistance but min guard for safety.   Transfers Overall transfer level: Needs assistance Equipment used: 1 person hand held assist Transfers: Sit to/from Stand Sit to Stand: Min assist         General transfer comment: Min steadying assistance.     Balance Overall balance assessment: Needs assistance Sitting-balance support: No upper extremity supported;Feet supported Sitting balance-Leahy Scale: Good     Standing balance support: Single extremity supported;No upper extremity supported;During functional activity Standing balance-Leahy Scale: Fair Standing balance comment: Able to statically stand without UE support for grooming tasks at sink. Benefits from single UE support during functional mobility.                            ADL either performed or assessed with clinical judgement   ADL Overall ADL's : Needs assistance/impaired Eating/Feeding: Set up;Bed level   Grooming: Supervision/safety;Standing   Upper Body Bathing: Supervision/ safety;Sitting   Lower Body Bathing: Supervison/ safety;Sit to/from stand   Upper Body Dressing : Supervision/safety;Sitting   Lower Body Dressing: Supervision/safety;Sit to/from stand   Toilet Transfer: Min guard;Ambulation;Minimal assistance Toilet Transfer Details (indicate cue type and reason): Pt reaching for furniture throughout session  Toileting- Clothing Manipulation and Hygiene: Sit to/from stand;Supervision/safety       Functional mobility during ADLs: Min guard;Minimal assistance (progressing to min guard ) General ADL Comments: Pt reports feeling off balance due to just waking up. She is able to follow commands and complete basic ADL in hospital setting with supervision to min assist.  Vision Patient Visual Report: Blurring of vision (Reports blurring of vision last  night but resolved today) Vision Assessment?: Vision impaired- to be further tested in functional context Additional Comments: Reports blurring of vision yesterday but that this has resolved. Pt able to utilize vision functionally during simple ADL in room. Full assessment limited by transport arriving to take pt to MRI.      Perception     Praxis      Pertinent Vitals/Pain Pain Assessment: No/denies pain     Hand Dominance Right   Extremity/Trunk Assessment Upper Extremity Assessment Upper Extremity Assessment: Generalized weakness;RUE deficits/detail RUE Deficits / Details: Limited R shoulder AROM due to previous humerus fracture.    Lower Extremity Assessment Lower Extremity Assessment: Defer to PT evaluation       Communication Communication Communication: No difficulties   Cognition Arousal/Alertness: Awake/alert Behavior During Therapy: WFL for tasks assessed/performed Overall Cognitive Status: Within Functional Limits for tasks assessed                                     General Comments       Exercises     Shoulder Instructions      Home Living Family/patient expects to be discharged to:: Private residence Living Arrangements: Alone Available Help at Discharge: Family;Available PRN/intermittently (daughter lives 3 doors down) Type of Home: House Home Access: Stairs to enter Technical brewer of Steps: 2 Entrance Stairs-Rails: Left Home Layout: One level     Bathroom Shower/Tub: Occupational psychologist: Standard     Home Equipment: Cane - single point;Grab bars - tub/shower;Shower seat;Walker - 4 wheels          Prior Functioning/Environment Level of Independence: Independent with assistive device(s)        Comments: Uses single point cane during community mobility.         OT Problem List: Decreased strength;Decreased activity tolerance;Impaired vision/perception;Impaired balance (sitting and/or  standing);Decreased safety awareness;Decreased knowledge of use of DME or AE;Decreased knowledge of precautions;Impaired UE functional use      OT Treatment/Interventions: Therapeutic exercise;Self-care/ADL training;Energy conservation;DME and/or AE instruction;Therapeutic activities;Visual/perceptual remediation/compensation;Patient/family education;Balance training    OT Goals(Current goals can be found in the care plan section) Acute Rehab OT Goals Patient Stated Goal: to go home OT Goal Formulation: With patient Time For Goal Achievement: 07/28/16 Potential to Achieve Goals: Good ADL Goals Pt Will Perform Grooming: standing;with modified independence Pt Will Perform Upper Body Dressing: Independently;sitting Pt Will Perform Lower Body Dressing: sit to/from stand;with modified independence Pt Will Transfer to Toilet: ambulating;bedside commode;with modified independence (BSC over toilet) Pt Will Perform Toileting - Clothing Manipulation and hygiene: with modified independence;sit to/from stand Pt Will Perform Tub/Shower Transfer: Shower transfer;with modified independence;rolling walker;ambulating;shower seat  OT Frequency: Min 2X/week   Barriers to D/C:            Co-evaluation              AM-PAC PT "6 Clicks" Daily Activity     Outcome Measure Help from another person eating meals?: None Help from another person taking care of personal grooming?: A Little Help from another person toileting, which includes using toliet, bedpan, or urinal?: A Little Help from another person bathing (including washing, rinsing, drying)?: A Little Help from another person to put on and taking off regular upper body clothing?: A Little Help from another person to put on and taking off regular  lower body clothing?: A Little 6 Click Score: 19   End of Session Nurse Communication: Mobility status  Activity Tolerance: Patient tolerated treatment well Patient left: in bed;with call bell/phone  within reach (with transport in room to take pt to MRI)  OT Visit Diagnosis: Unsteadiness on feet (R26.81);Muscle weakness (generalized) (M62.81);Low vision, both eyes (H54.2)                Time: 0721-8288 OT Time Calculation (min): 11 min Charges:  OT General Charges $OT Visit: 1 Procedure OT Evaluation $OT Eval Moderate Complexity: 1 Procedure G-Codes: OT G-codes **NOT FOR INPATIENT CLASS** Functional Assessment Tool Used: Clinical judgement Functional Limitation: Self care Self Care Current Status (F3744): At least 1 percent but less than 20 percent impaired, limited or restricted Self Care Goal Status (Z1460): 0 percent impaired, limited or restricted   Norman Herrlich, MS OTR/L  Pager: [facility] A Alexis Reber 07/14/2016, 8:42 AM

## 2016-07-14 NOTE — Consult Note (Signed)
Referring Physician: ED    Chief Complaint: code stroke, R sided weakness, R face droop, HA  HPI:                                                                                                                                         Gloria [name] is an 83 y.o. female with a past medical history that is pertinent for HTN, HLD, a fib on coumadin, meningioma occipital pole, hep C, parotid gland tumor removal, brought in as a code stroke due to acute onset HA with R sided weakness and R face droop. She endorses having mild HA for the past couple of days and said that she went to bed last night feeling well but woke up at 1:30 am with severe HA and weakness R side as well as R face droop. EMS report nitial BP 204/78. Denies slurred speech, vertigo, double vision, focal numbness, language or vision impairment. No recent head or neck trauma. Her R sided weakness resolved while in the CT suite. Remains complaining of HA. STAT head CT showed no acute abnormality. INR 2.5   Date last known well: 07/13/16 Time last known well: 2200 tPA Given: no, marked improvement, coumadin with INR 2.5 NIHSS: 2 MRS: 0  Past Medical History:  Diagnosis Date  . Arthritis    right  . Atrial fibrillation ([place])   . GERD (gastroesophageal reflux disease)    hx of  . Hepatitis C   . Humerus fracture   . Hyperlipidemia   . Hypertension   . Irritable bowel syndrome   . Meningioma ([place])   . PONV (postoperative nausea and vomiting)     Past Surgical History:  Procedure Laterality Date  . BREAST SURGERY     lumpectomy  . CARDIOVASCULAR STRESS TEST  11/14/2006   EF 67%  . COLONOSCOPY W/ BIOPSIES AND POLYPECTOMY    . ORIF HUMERUS FRACTURE Right 04/18/2014   Procedure: OPEN REDUCTION INTERNAL FIXATION (ORIF) RIGHT PROXIMAL HUMERUS FRACTURE;  Surgeon: Netta Cedars, MD;  Location: [place];  Service: Orthopedics;  Laterality: Right;  . ORIF PERIPROSTHETIC FRACTURE Left 04/30/2013   Procedure: OPEN REDUCTION INTERNAL  FIXATION (ORIF) PERIPROSTHETIC FRACTURE;  Surgeon: [name], MD;  Location: [place];  Service: Orthopedics;  Laterality: Left;  . PAROTID GLAND TUMOR EXCISION    . TOTAL HIP ARTHROPLASTY  11/07   left  . TOTAL KNEE ARTHROPLASTY    . TRANSTHORACIC ECHOCARDIOGRAM  10/25/2009   EF 55-60%    Family History  Problem Relation Age of Onset  . Heart disease Brother    Social History:  reports that she quit smoking about 36 years ago. Her smoking use included Cigarettes. She has a 4.50 pack-year smoking history. She has never used smokeless tobacco. She reports that she does not drink alcohol or use drugs.  Allergies:  Allergies  Allergen Reactions  . Amlodipine Swelling  .  Cephalexin Other (See Comments)    unknown  . Codeine Other (See Comments)    unknown  . Metoclopramide Hcl Other (See Comments)    unknown    Medications:                                                                                                                           I have reviewed the patient's current medications. Prior to Admission:  (Not in a hospital admission)  ROS:                                                                                                                                       History obtained from the patient and EMS  General ROS: negative for - chills, fatigue, fever, night sweats, weight gain or weight loss Psychological ROS: negative for - behavioral disorder, hallucinations, memory difficulties, mood swings or suicidal ideation Ophthalmic ROS: negative for - blurry vision, double vision, eye pain or loss of vision ENT ROS: negative for - epistaxis, nasal discharge, oral lesions, sore throat, tinnitus or vertigo Allergy and Immunology ROS: negative for - hives or itchy/watery eyes Hematological and Lymphatic ROS: negative for - bleeding problems, bruising or swollen lymph nodes Endocrine ROS: negative for - galactorrhea, hair pattern changes, polydipsia/polyuria or  temperature intolerance Respiratory ROS: negative for - cough, hemoptysis, shortness of breath or wheezing Cardiovascular ROS: negative for - chest pain, dyspnea on exertion, edema or irregular heartbeat Gastrointestinal ROS: negative for - abdominal pain, diarrhea, hematemesis, nausea/vomiting or stool incontinence Genito-Urinary ROS: negative for - dysuria, hematuria, incontinence or urinary frequency/urgency Musculoskeletal ROS: negative for - joint swelling Neurological ROS: as noted in HPI Dermatological ROS: negative for rash and skin lesion changes  Physical exam: Blood pressure (!) 187/65, pulse (!) 52, temperature 98.9 F (37.2 C), temperature source Oral, resp. rate 16, SpO2 100 %. Constitutional: well developed, pleasant female feeling uncomfortable due to HA. Eyes: no jaundice or exophthalmos.  Head: normocephalic. No tenders temporal regions Neck: supple, no bruits, no JVD. No carotidynia.  Cardiac: no murmurs. Lungs: clear. Abdomen: soft, no tender, no mass. Extremities: no edema, clubbing, or cyanosis.  Skin: no rash Neurologic Examination:  Mental Status: Alert, oriented, thought content appropriate.  Speech fluent without evidence of aphasia.  Able to follow 3 step commands without difficulty. Cranial Nerves: II:  Visual fields grossly normal, pupils equal, round, reactive to light and accommodation III,IV, VI: ptosis not present, extra-ocular motions intact bilaterally V,VII: smile asymmetric due to mild R lower face weakness, facial light touch sensation normal bilaterally VIII: hearing normal bilaterally IX,X: uvula rises symmetrically XI: bilateral shoulder shrug XII: midline tongue extension without atrophy or fasciculations  Motor: Right : Upper extremity   5/5    Left:     Upper extremity   5/5  Lower extremity   5/5     Lower extremity   5/5 Tone and  bulk:normal tone throughout; no atrophy noted Sensory: Pinprick and light touch diminished R leg Deep Tendon Reflexes:  Right: Upper Extremity   Left: Upper extremity   biceps (C-5 to C-6) 2/4   biceps (C-5 to C-6) 2/4 tricep (C7) 2/4    triceps (C7) 2/4 Brachioradialis (C6) 2/4  Brachioradialis (C6) 2/4  Lower Extremity Lower Extremity  quadriceps (L-2 to L-4) 2/4   quadriceps (L-2 to L-4) 2/4 Achilles (S1) 2/4   Achilles (S1) 2/4  Plantars: Right: downgoing   Left: downgoing Cerebellar: normal finger-to-nose,  normal heel-to-shin test Gait:  No tested due to acute clinical status, multiple leads No meningeal irritation signs.  Results for orders placed or performed during the hospital encounter of 07/14/16 (from the past 48 hour(s))  Protime-INR     Status: Abnormal   Collection Time: 07/14/16  2:30 AM  Result Value Ref Range   Prothrombin Time 28.1 (H) 11.4 - 15.2 seconds   INR 2.58   APTT     Status: None   Collection Time: 07/14/16  2:30 AM  Result Value Ref Range   aPTT 33 24 - 36 seconds  CBC     Status: None   Collection Time: 07/14/16  2:30 AM  Result Value Ref Range   WBC 6.7 4.0 - 10.5 K/uL   RBC 4.62 3.87 - 5.11 MIL/uL   Hemoglobin 13.8 12.0 - 15.0 g/dL   HCT 42.1 36.0 - 46.0 %   MCV 91.1 78.0 - 100.0 fL   MCH 29.9 26.0 - 34.0 pg   MCHC 32.8 30.0 - 36.0 g/dL   RDW 14.2 11.5 - 15.5 %   Platelets 211 150 - 400 K/uL  Differential     Status: None   Collection Time: 07/14/16  2:30 AM  Result Value Ref Range   Neutrophils Relative % 53 %   Neutro Abs 3.6 1.7 - 7.7 K/uL   Lymphocytes Relative 34 %   Lymphs Abs 2.2 0.7 - 4.0 K/uL   Monocytes Relative 10 %   Monocytes Absolute 0.7 0.1 - 1.0 K/uL   Eosinophils Relative 2 %   Eosinophils Absolute 0.2 0.0 - 0.7 K/uL   Basophils Relative 1 %   Basophils Absolute 0.0 0.0 - 0.1 K/uL  CBG monitoring, ED     Status: Abnormal   Collection Time: 07/14/16  2:32 AM  Result Value Ref Range   Glucose-Capillary 101 (H)  65 - 99 mg/dL  I-stat troponin, ED     Status: None   Collection Time: 07/14/16  2:36 AM  Result Value Ref Range   Troponin i, poc 0.01 0.00 - 0.08 ng/mL   Comment 3            Comment: Due to the release kinetics of cTnI, a negative  result within the first hours of the onset of symptoms does not rule out myocardial infarction with certainty. If myocardial infarction is still suspected, repeat the test at appropriate intervals.   I-Stat Chem 8, ED     Status: Abnormal   Collection Time: 07/14/16  2:38 AM  Result Value Ref Range   Sodium 139 135 - 145 mmol/L   Potassium 3.5 3.5 - 5.1 mmol/L   Chloride 101 101 - 111 mmol/L   BUN 12 6 - 20 mg/dL   Creatinine, Ser 0.70 0.44 - 1.00 mg/dL   Glucose, Bld 105 (H) 65 - 99 mg/dL   Calcium, Ion 1.10 (L) 1.15 - 1.40 mmol/L   TCO2 26 0 - 100 mmol/L   Hemoglobin 14.3 12.0 - 15.0 g/dL   HCT 42.0 36.0 - 46.0 %   No results found.    Assessment: 83 y.o. female with risk factors for stroke that include HTN, HLD, a fib on coumadin, brought in as a code stroke due to acute onset R hemiparesis that had resolved, R face droop, and HA. NIHSS 2, CT head without acute abnormality. Suspect acute L subcortical infarct, but INR 2.5, thus no iv thrombolysis. Don't suspect LVO.Marland Kitchen Severe HA upon awakening (present in a milder form for past couple of days) with an associated focal deficit, but low index of suspicion for CVT, dissection. Admit to the hospital and complete stroke work up as outlined below.   Stroke Risk Factors - atrial fibrillation, hyperlipidemia and hypertension  Plan: 1. HgbA1c, fasting lipid panel 2. MRI, MRA  of the brain without contrast 3. Echocardiogram 4. Carotid dopplers 5. Prophylactic therapy-Anticoagulation: Coumadin as per chronic regimen. 6. Risk factor modification 7. Telemetry monitoring 8. Frequent neuro checks 9. PT/OT SLP  Dorian Pod, MD [facility] [phone]  07/14/2016, 3:05 AM

## 2016-07-14 NOTE — Evaluation (Signed)
Physical Therapy Evaluation Patient Details Name: CHAKA BOYSON MRN: [id number] DOB: [birth year] Today's Date: 07/14/2016   History of Present Illness  Pt is an 83 y.o. female who presented to the ED with severe L sided headache. EMS found BP to be 200/100. Pt has a history of R facial droop and R sided weakness following multiple surgeries. PMH significant for A-fib, HTN, R sided arthritis, GERD, hepatits C, humerus fracture, hyperlipidemia, hypertension, irritable bowel syndroms, meningioma, and postoperative nausea and vomiting. CT revealing 15x17 mm calcified meningioma at parasagittal R occipital lobe and small remote lacunar infarct involving R caudate head. MRI pending.     Clinical Impression  Patient presents with problems listed below.  Will benefit from acute PT to maximize functional independence prior to discharge.  Patient with slight imbalance during gait.  Recommended patient use her cane at all times during gait.  Recommended HHPT for continued therapy for balance training.  Patient refused therapy, stating "I can do that on my own".  Encouraged patient to contact MD for [facility] referral if she is not seeing improvement with balance/mobility.  Will continue to follow while in hospital.    Follow Up Recommendations Supervision - Intermittent;Home health PT (Patient declined HHPT.)    Equipment Recommendations  None recommended by PT    Recommendations for Other Services       Precautions / Restrictions Precautions Precautions: Fall Restrictions Weight Bearing Restrictions: No      Mobility  Bed Mobility Overal bed mobility: Modified Independent Bed Mobility: Supine to Sit;Sit to Supine     Supine to sit: Modified independent (Device/Increase time) Sit to supine: Modified independent (Device/Increase time)   General bed mobility comments: Increased time   Transfers Overall transfer level: Needs assistance Equipment used: None Transfers: Sit to/from Stand Sit to  Stand: Min guard         General transfer comment: Assist for safety.  No physical assist required.  Ambulation/Gait Ambulation/Gait assistance: Min guard Ambulation Distance (Feet): 60 Feet Assistive device: None Gait Pattern/deviations: Step-through pattern;Decreased step length - right;Decreased step length - left;Decreased stride length Gait velocity: decreased Gait velocity interpretation: Below normal speed for age/gender General Gait Details: Patient with slow, guarded gait with no assistive device.  Noted increased lateral trunk movement during gait.  No loss of balance during gait or with higher level balance activities.  Stairs            Wheelchair Mobility    Modified Rankin (Stroke Patients Only)       Balance Overall balance assessment: Needs assistance         Standing balance support: No upper extremity supported Standing balance-Leahy Scale: Good Standing balance comment: Patient able to ambulate with no assistive device, including weight shifting.  Would benefit from cane during gait.             High level balance activites: Backward walking;Direction changes;Turns;Sudden stops;Head turns High Level Balance Comments: No loss of balance during these activities.  Does move more slowly while performing.             Pertinent Vitals/Pain Pain Assessment: No/denies pain    Home Living Family/patient expects to be discharged to:: Private residence Living Arrangements: Alone Available Help at Discharge: Family;Available PRN/intermittently (Daughter lives 3 doors down) Type of Home: House Home Access: Stairs to enter Entrance Stairs-Rails: Left Entrance Stairs-Number of Steps: 2 Home Layout: One level Home Equipment: Cane - single point;Grab bars - tub/shower;Shower seat;Walker - 4 wheels;Wheelchair - manual  Prior Function Level of Independence: Independent with assistive device(s)         Comments: Uses single point cane  during community mobility.      Hand Dominance   Dominant Hand: Right    Extremity/Trunk Assessment   Upper Extremity Assessment Upper Extremity Assessment: Defer to OT evaluation    Lower Extremity Assessment Lower Extremity Assessment: LLE deficits/detail LLE Deficits / Details: Lt knee with valgus deformity. At times, patient's knees bump during gait - no loss of balance.       Communication   Communication: No difficulties  Cognition Arousal/Alertness: Awake/alert Behavior During Therapy: WFL for tasks assessed/performed;Anxious Overall Cognitive Status: Within Functional Limits for tasks assessed                                        General Comments      Exercises     Assessment/Plan    PT Assessment Patient needs continued PT services  PT Problem List Decreased strength;Decreased balance;Decreased mobility;Decreased knowledge of use of DME       PT Treatment Interventions DME instruction;Gait training;Functional mobility training;Therapeutic activities;Balance training;Patient/family education    PT Goals (Current goals can be found in the Care Plan section)  Acute Rehab PT Goals Patient Stated Goal: to go home PT Goal Formulation: With patient Time For Goal Achievement: 07/21/16 Potential to Achieve Goals: Good    Frequency Min 3X/week   Barriers to discharge Decreased caregiver support Patient lives alone    Co-evaluation               AM-PAC PT "6 Clicks" Daily Activity  Outcome Measure Difficulty turning over in bed (including adjusting bedclothes, sheets and blankets)?: None Difficulty moving from lying on back to sitting on the side of the bed? : None Difficulty sitting down on and standing up from a chair with arms (e.g., wheelchair, bedside commode, etc,.)?: None Help needed moving to and from a bed to chair (including a wheelchair)?: None Help needed walking in hospital room?: A Little Help needed climbing 3-5  steps with a railing? : A Little 6 Click Score: 22    End of Session Equipment Utilized During Treatment: Gait belt Activity Tolerance: Patient tolerated treatment well Patient left: in bed;with call bell/phone within reach Nurse Communication: Mobility status PT Visit Diagnosis: Other abnormalities of gait and mobility (R26.89);Muscle weakness (generalized) (M62.81)    Time: 1103-1594 PT Time Calculation (min) (ACUTE ONLY): 27 min   Charges:   PT Evaluation $PT Eval Moderate Complexity: 1 Procedure PT Treatments $Gait Training: 8-22 mins   PT G Codes:   PT G-Codes **NOT FOR INPATIENT CLASS** Functional Assessment Tool Used: AM-PAC 6 Clicks Basic Mobility Functional Limitation: Mobility: Walking and moving around Mobility: Walking and Moving Around Current Status (V8592): At least 1 percent but less than 20 percent impaired, limited or restricted Mobility: Walking and Moving Around Goal Status [phone]): At least 1 percent but less than 20 percent impaired, limited or restricted    Carita Pian. Sanjuana Kava, [facility] Acute Rehab Services Pager [place] 07/14/2016, 8:53 PM

## 2016-07-14 NOTE — H&P (Signed)
History and Physical  Patient Name: Gloria [name]     IRC:[id number]    DOB: 04-11-1930    DOA: 07/14/2016 PCP: Leanna Battles, MD   Patient coming from: Home     Chief Complaint: Headache  HPI: Gloria [name] is a 83 y.o. female with a past medical history significant for Afib on flecainide and warfarin, HTN who presents with severe headache.  The patient was in her usual state of health today, went to bed as usual at 10PM and then around 1AM was awoken by a severe left sided headache, "worst in her life".  She called 9-1-1 who found her with BP 200/100, right facial droop and right sided weakness and transported her.  There was no slurred speech, numbness. No visual changes. At least to me at 4:30 in the morning, the patient states that her right-sided facial droop is permanent since surgery that effective nerve in her face, and that she has had right shoulder, right hip and right knee replacements, and so she is chronically weak on that side.  ED course: -Afebrile, heart rate 52, respirations pulse ox normal, blood pressure 187/65 -Na 137, K 3.5, Cr 0.8, WBC 6.2K, Hgb 13.8 -INR 2.5 -Troponin negative -CT head showed old meningioma, no acute changes -ECG showed stable sinus bradycardia -She arrived as CODE STROKE, which was canceled by Neurology who suspected stroke and recommended stroke work up; per ED report, LP was discussed and deferred by Neurology -She was given compazine and diphenhydramine and her headache mostly resolved     Review of systems:  Review of Systems  Constitutional: Negative for chills and fever.  Neurological: Positive for headaches. Negative for dizziness, tingling, tremors, sensory change, speech change, focal weakness, seizures, loss of consciousness and weakness.  All other systems reviewed and are negative.        Past Medical History:  Diagnosis Date  . Arthritis    right  . Atrial fibrillation ([place])   . GERD (gastroesophageal reflux  disease)    hx of  . Hepatitis C   . Humerus fracture   . Hyperlipidemia   . Hypertension   . Irritable bowel syndrome   . Meningioma ([place])   . PONV (postoperative nausea and vomiting)     Past Surgical History:  Procedure Laterality Date  . BREAST SURGERY     lumpectomy  . CARDIOVASCULAR STRESS TEST  11/14/2006   EF 67%  . COLONOSCOPY W/ BIOPSIES AND POLYPECTOMY    . ORIF HUMERUS FRACTURE Right 04/18/2014   Procedure: OPEN REDUCTION INTERNAL FIXATION (ORIF) RIGHT PROXIMAL HUMERUS FRACTURE;  Surgeon: Netta Cedars, MD;  Location: [place];  Service: Orthopedics;  Laterality: Right;  . ORIF PERIPROSTHETIC FRACTURE Left 04/30/2013   Procedure: OPEN REDUCTION INTERNAL FIXATION (ORIF) PERIPROSTHETIC FRACTURE;  Surgeon: [name], MD;  Location: [place];  Service: Orthopedics;  Laterality: Left;  . PAROTID GLAND TUMOR EXCISION    . TOTAL HIP ARTHROPLASTY  11/07   left  . TOTAL KNEE ARTHROPLASTY    . TRANSTHORACIC ECHOCARDIOGRAM  10/25/2009   EF 55-60%    Social History: Patient lives alone, husband died in 11-29-22.  Patient walks unasssited.  Nonsmoker, from [place].    Allergies  Allergen Reactions  . Amlodipine Swelling  . Cephalexin Other (See Comments)    unknown  . Codeine Other (See Comments)    unknown  . Metoclopramide Hcl Other (See Comments)    unknown    Family history: family history includes Heart disease in her  brother.  Prior to Admission medications   Medication Sig Start Date End Date Taking? Authorizing Provider  acetaminophen (TYLENOL) 500 MG tablet Take 1,000 mg by mouth every 6 (six) hours as needed for mild pain.   Yes [provider]  Calcium Carb-Cholecalciferol (CALCIUM 600 + D PO) Take 1 tablet by mouth 2 (two) times daily.   Yes [provider]  flecainide (TAMBOCOR) 50 MG tablet Take 1 tablet (50 mg total) by mouth 2 (two) times daily. 04/29/11  Yes [place]ique, Peter M, MD  hydrochlorothiazide (HYDRODIURIL) 25 MG tablet TAKE ONE  TABLET BY MOUTH ONCE DAILY 06/18/16  Yes [place], Peter M, MD  losartan (COZAAR) 25 MG tablet Take 1 tablet (25 mg total) by mouth daily. 06/25/16 06/20/17 Yes [place], Peter M, MD  Multiple Vitamins-Minerals (ICAPS MV PO) Take 1 capsule by mouth 2 (two) times daily.    Yes [provider]  nitroGLYCERIN (NITROSTAT) 0.4 MG SL tablet Place 1 tablet (0.4 mg total) under the tongue every 5 (five) minutes as needed for chest pain. 06/05/16  Yes Florencia Reasons, MD  omeprazole (PRILOSEC) 20 MG capsule Take 20 mg by mouth 2 (two) times daily.    Yes [provider]  Polyethyl Glycol-Propyl Glycol (SYSTANE ULTRA OP) Apply 1 drop to eye daily as needed (dry eyes).    Yes [provider]  vitamin B-12 (CYANOCOBALAMIN) 1000 MCG tablet Take 1,000 mcg by mouth daily.   Yes [provider]  warfarin (COUMADIN) 6 MG tablet Take 3-6 tablets by mouth daily. Take 3 mg on Friday all other days take 6 mg 08/16/14  Yes [provider]     Physical Exam: BP (!) 158/56 (BP Location: Left Arm)   Pulse (!) 59   Temp 97.7 F (36.5 C) (Oral)   Resp 18   SpO2 98%  General appearance: Well-developed, thin elderly adult female, alert and in no acute distress.   Eyes: Anicteric, conjunctiva pink, lids and lashes normal. PERRL.    ENT: No nasal deformity, discharge, epistaxis.  Hearing normal. OP moist without lesions. Cardiac: RRR, nl S1-S2, no murmurs appreciated.  Capillary refill is brisk.  JVP normal.  No LE edema.  Radial and DP pulses 2+ and symmetric.  No carotid bruits. Respiratory: Normal respiratory rate and rhythm.  CTAB without rales or wheezes. GI: Abdomen soft without rigidity.  No TTP. No ascites, distension, no hepatosplenomegaly.   MSK: No deformities or effusions. Neuro: Pupils are 4 mm and reactive to 3 mm. Extraocular movements are intact, without nystagmus. Cranial nerve 5 is within normal limits. There is right facial droop. Cranial nerve 8 is within normal limits.  Cranial nerves 9 and 10 reveal equal palate elevation. Cranial nerve 11 reveals sternocleidomastoid strong. Cranial nerve 12 is midline. The left side is 5-/5 strength in upper and lower exetremities.  The right side is sublty weaker. Speech is fluent. Naming is grossly intact. Attention span and concentration are within normal limits.   Psych: The patient is oriented to time, place and person. Behavior appropriate.  Affect normal.  Recall, recent and remote, as well as general fund of knowledge seem within normal limits. No evidence of aural or visual hallucinations or delusions.       Labs on Admission:  I have personally reviewed following labs and imaging studies: CBC:  Recent Labs Lab 07/14/16 0230 07/14/16 0238  WBC 6.7  --   NEUTROABS 3.6  --   HGB 13.8 14.3  HCT 42.1 42.0  MCV  91.1  --   PLT 211  --    Basic Metabolic Panel:  Recent Labs Lab 07/14/16 0230 07/14/16 0238  NA 137 139  K 3.5 3.5  CL 102 101  CO2 25  --   GLUCOSE 110* 105*  BUN 11 12  CREATININE 0.80 0.70  CALCIUM 9.4  --    GFR: CrCl cannot be calculated (Unknown ideal weight.). Liver Function Tests:  Recent Labs Lab 07/14/16 0230  AST 25  ALT 17  ALKPHOS 92  BILITOT 0.9  PROT 6.3*  ALBUMIN 4.0   No results for input(s): LIPASE, AMYLASE in the last 168 hours. No results for input(s): AMMONIA in the last 168 hours. Coagulation Profile:  Recent Labs Lab 07/14/16 0230  INR 2.58   Cardiac Enzymes: No results for input(s): CKTOTAL, CKMB, CKMBINDEX, TROPONINI in the last 168 hours. BNP (last 3 results) No results for input(s): PROBNP in the last 8760 hours. HbA1C: No results for input(s): HGBA1C in the last 72 hours. CBG:  Recent Labs Lab 07/14/16 0232  GLUCAP 101*   Lipid Profile: No results for input(s): CHOL, HDL, LDLCALC, TRIG, CHOLHDL, LDLDIRECT in the last 72 hours. Thyroid Function Tests: No results for input(s): TSH, T4TOTAL, FREET4, T3FREE, THYROIDAB in the last 72  hours. Anemia Panel: No results for input(s): VITAMINB12, FOLATE, FERRITIN, TIBC, IRON, RETICCTPCT in the last 72 hours. Sepsis Labs: Invalid input(s): PROCALCITONIN, LACTICIDVEN No results found for this or any previous visit (from the past 240 hour(s)).    Radiological Exams on Admission:  Personally reviewed CT repprt: Ct Head Code Stroke W/o Cm  Result Date: 07/14/2016 CLINICAL DATA:  Code stroke. Initial evaluation for acute right-sided weakness, facial droop. EXAM: CT HEAD WITHOUT CONTRAST TECHNIQUE: Contiguous axial images were obtained from the base of the skull through the vertex without intravenous contrast. COMPARISON:  Prior CT from 04/28/2013. FINDINGS: Brain: Generalized cerebral atrophy with chronic microvascular ischemic disease, mildly progressed from previous. Small remote lacunar infarct present within the right basal ganglia. No acute intracranial hemorrhage. No evidence for acute large vessel territory infarct. Calcified meningioma at the parasagittal right occipital lobe measures 15 x 17 mm. No associated edema or mass effect. No midline shift. No hydrocephalus. No extra-axial fluid collection. Vascular: No asymmetric hyperdense vessel. Scattered vascular calcifications noted within the carotid siphons. Skull: Scalp soft tissues and calvarium within normal limits. Sinuses/Orbits: Globes and orbital soft tissues within normal limits. Patient status post lens extraction bilaterally. Paranasal sinuses are clear. Trace right mastoid effusion noted. Left mastoid air cells are clear. ASPECTS [facility] Stroke Program Early CT Score) - Ganglionic level infarction (caudate, lentiform nuclei, internal capsule, insula, M1-M3 cortex): 7 - Supraganglionic infarction (M4-M6 cortex): 3 Total score (0-10 with 10 being normal): 10 IMPRESSION: 1. No acute intracranial infarct or other process identified. 2. ASPECTS is 10. 3. 15 x 17 mm calcified meningioma at the parasagittal right occipital lobe  without associated mass effect. 4. Small remote lacunar infarct involving the right caudate head. 5. Mild age-related cerebral atrophy with chronic small vessel ischemic disease, progressed relative to 2015. Critical Value/emergent results were called by telephone at the time of interpretation on 07/14/2016 at 2:50 am to Dr. Armida Sans , who verbally acknowledged these results. Electronically Signed   By: Jeannine Boga M.D.   On: 07/14/2016 03:05     EKG: Independently reviewed. Rate 52. QTc normal.  No change from previous.    Assessment/Plan  1. Acute TIA:  This is new.  MRI pending.   -  Admit to telemetry -Neuro checks, NIHSS per protocol -Lipids, hemoglobin A1c -Carotid doppler, MRA or CT angiography of head and neck per Neurology, ordered -Echocardiogram ordered -PT/OT/SLP consultation -Consult to Neurology, appreciate recommendations   2. Atrial fibrillation:  CHADS2Vasc 4, now 6. -Continue flecainide -Continue warfarin  3. HTN:  -Continue HCTZ, losartan             DVT prophylaxis: N/A Code Status: DNR  Family Communication: None presetn  Disposition Plan: Anticipate Stroke work up as above and consult to ancillary services.  Expect discharge within 1 days. Consults called: Neurology, Dr. Armida Sans has seen patient. Admission status: Telemetry, OBS status  Core measures: -VTE prophylaxis ordered at time of admission -Aspirin ordered at admission -Atrial fibrillation: known, anticoagulated -tPA not given because of symptoms resolved -Dysphagia screen ordered in ER -Lipids ordered -PT eval ordered -Nonsmoker    Medical decision making: Patient seen at 4:30 AM on 07/14/2016.  The patient was discussed with Rachael Fee, PA-C. What exists of the patient's chart was reviewed in depth and summarized above.  Clinical condition: stable.       Edwin Dada [facility] Pager [phone]

## 2016-07-14 NOTE — Progress Notes (Signed)
Gloria [name] is a 83 yo female presenting to ED via [facility] with acute onset Right side weakness, Right side facial droop, Left side neck pain, and Left side headache. LKW 2200, woke at 0130 with severe headache and pain to Left lateral neck. CBG 91, NIH 2 History of A-Fib, HTN. Pt to be admitted to [facility] for TIA work up

## 2016-07-14 NOTE — Progress Notes (Signed)
VASCULAR LAB PRELIMINARY  PRELIMINARY  PRELIMINARY  PRELIMINARY  Carotid duplex completed.    Preliminary report:  1-39% ICA plaquing. Vertebral artery flow is antegrade.   Zohal Reny, RVT 07/14/2016, 9:48 AM

## 2016-07-14 NOTE — Progress Notes (Signed)
[facility] PROGRESS NOTE    Progress Note  Gloria [name]  KGM:[id number] DOB: 1930/02/13 DOA: 07/14/2016 PCP: Leanna Battles, MD     Brief Narrative:   Gloria [name] is an 83 y.o. female -year-old female with past medical history of chronic atrial fibrillation on flecainide and warfarin essential hypertension percent with severe headaches, she called 911 from her house and her blood pressure was 200/100 with a rate facial droop.  Assessment/Plan:   TIA (transient ischemic attack): HgbA1c, fasting lipid panel LDL> 100, HDL > 40 start statin. MRI, MRA of the brain without contrast pending PT, OT, Speech consult pending Echocardiogram pending Carotid dopplers  Prophylactic therapy-Antiplatelet med: Aspirin - dose 325 mg PO daily  Appreciate neurology's assistance. No events Cardiac Monitoring  Neurochecks q4h  Keep MAP 70  Chronic atrial fibrillation: CHADS2Vasc  6. Continue diet and warfarin.  Essential hypertension: Continue hydrocodone and losartan. Blood pressure seems to be at goal.   DVT prophylaxis: warfarrin Family Communication:none Disposition Plan/Barrier to D/C: home in 1-2 days Code Status:     Code Status Orders        Start     Ordered   07/14/16 0429  Do not attempt resuscitation (DNR)  Continuous    Question Answer Comment  In the event of cardiac or respiratory ARREST Do not call a "code blue"   In the event of cardiac or respiratory ARREST Do not perform Intubation, CPR, defibrillation or ACLS   In the event of cardiac or respiratory ARREST Use medication by any route, position, wound care, and other measures to relive pain and suffering. May use oxygen, suction and manual treatment of airway obstruction as needed for comfort.      07/14/16 0428    Code Status History    Date Active Date Inactive Code Status Order ID Comments User Context   06/04/2016  4:45 PM 06/05/2016  9:46 PM DNR [id number]  Aline August, MD Inpatient   04/18/2014  5:38 PM 04/21/2014  3:38 PM Full Code [id number]  Netta Cedars, MD Inpatient   04/30/2013  9:51 PM 05/04/2013  6:14 PM Full Code [id number]  [name], MD Inpatient   04/28/2013  8:26 PM 04/30/2013  9:51 PM Full Code [id number]  Mendel Corning, MD Inpatient    Advance Directive Documentation     Most Recent Value  Type of Advance Directive  Living will  Pre-existing out of facility DNR order (yellow form or pink MOST form)  -  "MOST" Form in Place?  -        IV Access:    Peripheral IV   Procedures and diagnostic studies:   Ct Head Code Stroke W/o Cm  Result Date: 07/14/2016 CLINICAL DATA:  Code stroke. Initial evaluation for acute right-sided weakness, facial droop. EXAM: CT HEAD WITHOUT CONTRAST TECHNIQUE: Contiguous axial images were obtained from the base of the skull through the vertex without intravenous contrast. COMPARISON:  Prior CT from 04/28/2013. FINDINGS: Brain: Generalized cerebral atrophy with chronic microvascular ischemic disease, mildly progressed from previous. Small remote lacunar infarct present within the right basal ganglia. No acute intracranial hemorrhage. No evidence for acute large vessel territory infarct. Calcified meningioma at the parasagittal right occipital lobe measures 15 x 17 mm. No associated edema or mass effect. No midline shift. No hydrocephalus. No extra-axial fluid collection. Vascular: No asymmetric hyperdense vessel. Scattered vascular calcifications noted within the carotid siphons. Skull: Scalp soft tissues and calvarium within normal limits. Sinuses/Orbits: Globes and orbital soft tissues  within normal limits. Patient status post lens extraction bilaterally. Paranasal sinuses are clear. Trace right mastoid effusion noted. Left mastoid air cells are clear. ASPECTS [facility] Stroke Program Early CT Score) - Ganglionic level infarction (caudate, lentiform nuclei, internal capsule, insula, M1-M3 cortex): 7 - Supraganglionic infarction (M4-M6  cortex): 3 Total score (0-10 with 10 being normal): 10 IMPRESSION: 1. No acute intracranial infarct or other process identified. 2. ASPECTS is 10. 3. 15 x 17 mm calcified meningioma at the parasagittal right occipital lobe without associated mass effect. 4. Small remote lacunar infarct involving the right caudate head. 5. Mild age-related cerebral atrophy with chronic small vessel ischemic disease, progressed relative to 2015. Critical Value/emergent results were called by telephone at the time of interpretation on 07/14/2016 at 2:50 am to Dr. Armida Sans , who verbally acknowledged these results. Electronically Signed   By: Jeannine Boga M.D.   On: 07/14/2016 03:05     Medical Consultants:    None.  Anti-Infectives:   none  Subjective:    Gloria [name] she relates some back pain from lying in the bed. But otherwise feels no acute complaints.  Objective:    Vitals:   07/14/16 0330 07/14/16 0429 07/14/16 0502 07/14/16 0559  BP: (!) 159/67 (!) 158/56  (!) 147/49  Pulse: (!) 52 (!) 59  60  Resp: (!) 21 18  18   Temp:  97.7 F (36.5 C)  97.7 F (36.5 C)  TempSrc:  Oral  Oral  SpO2: 98% 98%  97%  Weight:   66 kg (145 lb 8.1 oz)   Height:   5\' 6"  (1.676 m)     Intake/Output Summary (Last 24 hours) at 07/14/16 0657 Last data filed at 07/14/16 0352  Gross per 24 hour  Intake              500 ml  Output                0 ml  Net              500 ml   Filed Weights   07/14/16 0502  Weight: 66 kg (145 lb 8.1 oz)    Exam: General exam: In no acute distress Respiratory system: Good air movement to auscultation. Cardiovascular system: Regular rate and rhythm with positive S1-S2 no murmurs scallops. Gastrointestinal system: Abdomen soft nontender no distended Extremities: Lower extremity edema.. Skin: No rashes or ulceration Psychiatry: Judgment and insight appear normal.   Data Reviewed:    Labs: Basic Metabolic Panel:  Recent Labs Lab 07/14/16 0230 07/14/16 0238    NA 137 139  K 3.5 3.5  CL 102 101  CO2 25  --   GLUCOSE 110* 105*  BUN 11 12  CREATININE 0.80 0.70  CALCIUM 9.4  --    GFR Estimated Creatinine Clearance: 49 mL/min (by C-G formula based on SCr of 0.7 mg/dL). Liver Function Tests:  Recent Labs Lab 07/14/16 0230  AST 25  ALT 17  ALKPHOS 92  BILITOT 0.9  PROT 6.3*  ALBUMIN 4.0   No results for input(s): LIPASE, AMYLASE in the last 168 hours. No results for input(s): AMMONIA in the last 168 hours. Coagulation profile  Recent Labs Lab 07/14/16 0230  INR 2.58    CBC:  Recent Labs Lab 07/14/16 0230 07/14/16 0238  WBC 6.7  --   NEUTROABS 3.6  --   HGB 13.8 14.3  HCT 42.1 42.0  MCV 91.1  --   PLT 211  --  Cardiac Enzymes: No results for input(s): CKTOTAL, CKMB, CKMBINDEX, TROPONINI in the last 168 hours. BNP (last 3 results) No results for input(s): PROBNP in the last 8760 hours. CBG:  Recent Labs Lab 07/14/16 0232  GLUCAP 101*   D-Dimer: No results for input(s): DDIMER in the last 72 hours. Hgb A1c: No results for input(s): HGBA1C in the last 72 hours. Lipid Profile: No results for input(s): CHOL, HDL, LDLCALC, TRIG, CHOLHDL, LDLDIRECT in the last 72 hours. Thyroid function studies: No results for input(s): TSH, T4TOTAL, T3FREE, THYROIDAB in the last 72 hours.  Invalid input(s): FREET3 Anemia work up: No results for input(s): VITAMINB12, FOLATE, FERRITIN, TIBC, IRON, RETICCTPCT in the last 72 hours. Sepsis Labs:  Recent Labs Lab 07/14/16 0230  WBC 6.7   Microbiology No results found for this or any previous visit (from the past 240 hour(s)).   Medications:   . flecainide  50 mg Oral BID  . hydrochlorothiazide  25 mg Oral Daily  . losartan  25 mg Oral Daily  . pantoprazole  40 mg Oral Daily  . [START ON 07/19/2016] warfarin  3 mg Oral Q Fri-1800  . warfarin  6 mg Oral Once per day on Sun Mon Tue Wed Thu Sat  . Warfarin - Pharmacist Dosing Inpatient   Does not apply q1800    Continuous Infusions:  Time spent: 35 min   LOS: 0 days   Charlynne Cousins  [facility] Pager [phone]  *Please refer to [email or web address], password [email or web address] to get updated schedule on who will round on this patient, as hospitalists switch teams weekly. If 7PM-7AM, please contact night-coverage at [email or web address], password [email or web address] for any overnight needs.  07/14/2016, 6:57 AM

## 2016-07-14 NOTE — ED Triage Notes (Signed)
Pt to ED with EMS as a Code Stroke. Pt went to bed at 10pm, woke up this morning at 0130 with "worse headache of my life," R sided weakness, and R sided facial droop. Pt is on coumadin for afib.

## 2016-07-15 DIAGNOSIS — R531 Weakness: Secondary | ICD-10-CM | POA: Diagnosis not present

## 2016-07-15 DIAGNOSIS — I482 Chronic atrial fibrillation: Secondary | ICD-10-CM | POA: Diagnosis not present

## 2016-07-15 DIAGNOSIS — G459 Transient cerebral ischemic attack, unspecified: Secondary | ICD-10-CM | POA: Diagnosis not present

## 2016-07-15 DIAGNOSIS — R51 Headache: Secondary | ICD-10-CM | POA: Diagnosis not present

## 2016-07-15 DIAGNOSIS — G458 Other transient cerebral ischemic attacks and related syndromes: Secondary | ICD-10-CM | POA: Diagnosis not present

## 2016-07-15 DIAGNOSIS — I10 Essential (primary) hypertension: Secondary | ICD-10-CM | POA: Diagnosis not present

## 2016-07-15 LAB — HEMOGLOBIN A1C
Hgb A1c MFr Bld: 5.5 % (ref 4.8–5.6)
Mean Plasma Glucose: 111 mg/dL

## 2016-07-15 LAB — PROTIME-INR
INR: 2.56
Prothrombin Time: 28 seconds — ABNORMAL HIGH (ref 11.4–15.2)

## 2016-07-15 MED ORDER — ATORVASTATIN CALCIUM 40 MG PO TABS: 40.0000 mg | ORAL_TABLET | Freq: Every day | ORAL | 0 refills | Status: DC

## 2016-07-15 NOTE — Care Management Obs Status (Signed)
[facility] NOTIFICATION   Patient Details  Name: TIENA MANANSALA MRN: [id number] Date of Birth: 06-23-1930   Medicare Observation Status Notification Given:  Yes    Sharin Mons, RN 07/15/2016, 10:44 AM

## 2016-07-15 NOTE — Progress Notes (Signed)
STROKE TEAM PROGRESS NOTE   HISTORY OF PRESENT ILLNESS (per record) Gloria [name] is an 83 y.o. female with a past medical history that is pertinent for HTN, HLD, a fib on coumadin, meningioma occipital pole, hep C, parotid gland tumor removal, brought in as a code stroke due to acute onset HA with R sided weakness and R face droop. She endorses having mild HA for the past couple of days and said that she went to bed last night feeling well but woke up at 1:30 am with severe HA and weakness R side as well as R face droop. EMS report nitial BP 204/78. Denies slurred speech, vertigo, double vision, focal numbness, language or vision impairment. No recent head or neck trauma. Her R sided weakness resolved while in the CT suite. Remains complaining of HA. STAT head CT showed no acute abnormality. INR 2.5   Date last known well: 07/13/16 Time last known well: 2200 tPA Given: no, marked improvement, coumadin with INR 2.5 NIHSS: 2 MRS: 0   SUBJECTIVE (INTERVAL HISTORY) No family members present. The patient reports that her headache is now gone. INR 2.56 today  OBJECTIVE Temp:  [97.8 F (36.6 C)] 97.8 F (36.6 C) (07/09 0501) Pulse Rate:  [49-50] 49 (07/09 0501) Cardiac Rhythm: Sinus bradycardia (07/09 0700) Resp:  [16-19] 18 (07/09 0501) BP: (152-165)/(53-57) 152/57 (07/09 0501) SpO2:  [93 %-97 %] 93 % (07/09 0501)  CBC:   Recent Labs Lab 07/14/16 0230 07/14/16 0238  WBC 6.7  --   NEUTROABS 3.6  --   HGB 13.8 14.3  HCT 42.1 42.0  MCV 91.1  --   PLT 211  --     Basic Metabolic Panel:   Recent Labs Lab 07/14/16 0230 07/14/16 0238  NA 137 139  K 3.5 3.5  CL 102 101  CO2 25  --   GLUCOSE 110* 105*  BUN 11 12  CREATININE 0.80 0.70  CALCIUM 9.4  --     Lipid Panel:     Component Value Date/Time   CHOL 178 07/14/2016 0536   TRIG 69 07/14/2016 0536   HDL 58 07/14/2016 0536   CHOLHDL 3.1 07/14/2016 0536   VLDL 14 07/14/2016 0536   LDLCALC 106 (H) 07/14/2016  0536   HgbA1c:  Lab Results  Component Value Date   HGBA1C 5.5 07/14/2016   Urine Drug Screen: No results found for: LABOPIA, COCAINSCRNUR, LABBENZ, AMPHETMU, THCU, LABBARB  Alcohol Level No results found for: [place] Head Code Stroke W/o Cm 07/14/2016 1. No acute intracranial infarct or other process identified.  2. ASPECTS is 10.  3. 15 x 17 mm calcified meningioma at the parasagittal right occipital lobe without associated mass effect.  4. Small remote lacunar infarct involving the right caudate head.  5. Mild age-related cerebral atrophy with chronic small vessel ischemic disease, progressed relative to 2015.    Mr Jodene Nam Head/brain Wo Cm 07/14/2016 1. No acute intracranial finding, including infarct. Negative intracranial MRA.  2. Right mastoid opacification that is new from 2015. Negative nasopharynx.  3. Chronic small vessel ischemia with mild progression since 2011.  4. 15 mm right occipital calcified meningioma.  ESR 2 mm  PHYSICAL EXAM Pleasant elderly Caucasian lady currently not in distress.  Afebrile. Head is nontraumatic. Neck is supple without bruit.    Cardiac exam no murmur or gallop. Lungs are clear to auscultation. Distal pulses are well felt.  Neurological Exam ;  Awake  Alert oriented x 3. Normal  speech and language.eye movements full without nystagmus.fundi were not visualized. Vision acuity and fields appear normal. Hearing is normal. Palatal movements are normal. Face symmetric. Tongue midline. Normal strength, tone, reflexes and coordination. Normal sensation. Gait deferred.      ASSESSMENT/PLAN Ms. Gloria [name] is a 83 y.o. female with history of HTN, HLD, a fib on coumadin, meningioma occipital pole, hep C, parotid gland tumor removal,  presenting with acute onset HA with R sided weakness R face droop, BP 204/78 and INR 2.5  She did not receive IV t-PA due to coumadin therapy with INR 2.5.  Possible complex migraine vs TIA: She has history  of A. fib and is on warfarin and INR was optimal  Resultant  No deficits  CT head - Small remote lacunar infarct right caudate head. Calcified meningioma.  MRI head - no acute findings.  MRA head - negative  Carotid Doppler - 1-39% ICA plaquing. Vertebral artery flow is antegrade.   2D Echo - 06/05/2016 - EF 55-60%. No cardiac source of emboli identified.  LDL - 106  HgbA1c - 5.5  VTE prophylaxis - Warfarin Diet Heart Room service appropriate? Yes; Fluid consistency: Thin Diet - low sodium heart healthy  warfarin daily prior to admission, now on warfarin daily  Patient counseled to be compliant with her antithrombotic medications  Ongoing aggressive stroke risk factor management  Therapy recommendations: Home health OT recommended. PT evaluation pending.  Disposition: Home  Hypertension  Stable  Permissive hypertension (OK if < 220/120) but gradually normalize in 5-7 days  Long-term BP goal normotensive  Hyperlipidemia  Home meds:  No statin PTA  LDL 106, goal < 70  Now on Lipitor 40 mg daily  Continue statin at discharge    Other Stroke Risk Factors  Advanced age  Former smoker - quit 36 years ago.  Hx stroke/TIA by imaging   Other Active Problems  Bradycardia 40s - 50s  Incidental calcified small meningioma   PLAN  Dc home later today  Hospital day # 0      She presented with transient episode of new onset headache with left hemifacial numbness which has resolved. Etiology is indeterminate and brain imaging is negative for acute stroke but she does have a remote age lacunar infarct and has chronic atrial fibrillation and is on warfarin and INR  is optimal..     Continue warfarin for stroke prevention and aggressive risk factor modification.   She also has an incidental calcified meningioma which needs conservative follow-up. Stroke team will sign off. Follow-up as an outpatient in stroke clinic in 6 weeks. Antony Contras, MD Medical  Director [facility] Pager: [phone] 07/15/2016 1:18 PM   To contact Stroke Continuity provider, please refer to [email or web address] After hours, contact General Neurology

## 2016-07-15 NOTE — Progress Notes (Signed)
Occupational Therapy Treatment and Discharge Patient Details Name: Gloria [name] MRN: [id number] DOB: 01/20/1930 Today's Date: 07/15/2016    History of present illness Pt is an 83 y.o. female who presented to the ED with severe L sided headache. EMS found BP to be 200/100. Pt has a history of R facial droop and R sided weakness following multiple surgeries. PMH significant for A-fib, HTN, R sided arthritis, GERD, hepatits C, humerus fracture, hyperlipidemia, hypertension, irritable bowel syndroms, meningioma, and postoperative nausea and vomiting. CT revealing 15x17 mm calcified meningioma at parasagittal R occipital lobe and small remote lacunar infarct involving R caudate head. MRI pending.   OT comments  Pt reports functioning at her baseline. Pt using furniture to steady as she ambulated about her room. Stood at sink with supervision for safety for grooming and performed LB dressing without assist. Recommended pt keep her cane with her at all times. Pt verbalizing understanding. No further OT needs.  Follow Up Recommendations  No OT follow up    Equipment Recommendations  None recommended by OT    Recommendations for Other Services      Precautions / Restrictions Precautions Precautions: Fall Restrictions Weight Bearing Restrictions: No       Mobility Bed Mobility Overal bed mobility: Modified Independent                Transfers Overall transfer level: Modified independent Equipment used: None             General transfer comment: no physical assist, static standing fair    Balance Overall balance assessment: Needs assistance   Sitting balance-Leahy Scale: Good     Standing balance support: No upper extremity supported Standing balance-Leahy Scale: Fair                             ADL either performed or assessed with clinical judgement   ADL Overall ADL's : Needs assistance/impaired     Grooming: Supervision/safety;Standing;Wash/dry  hands;Oral care;Brushing hair               Lower Body Dressing: Supervision/safety;Sit to/from stand   Toilet Transfer: Supervision/safety;Ambulation Toilet Transfer Details (indicate cue type and reason): Pt reaching for furniture throughout session  [place] and Hygiene: Sit to/from stand;Supervision/safety       Functional mobility during ADLs: Supervision/safety General ADL Comments: Pt likely at her baseline, reports using furniture to steady or cane at home. Pt is well aware of strategies to prevent falling from previous therapy.     Vision       Perception     Praxis      Cognition Arousal/Alertness: Awake/alert Behavior During Therapy: WFL for tasks assessed/performed;Anxious Overall Cognitive Status: Within Functional Limits for tasks assessed                                          Exercises     Shoulder Instructions       General Comments      Pertinent Vitals/ Pain       Pain Assessment: No/denies pain  Home Living                                          Prior Functioning/Environment  Frequency  Min 2X/week        Progress Toward Goals  OT Goals(current goals can now be found in the care plan section)  Progress towards OT goals: Progressing toward goals  Acute Rehab OT Goals Patient Stated Goal: to go home OT Goal Formulation: With patient Time For Goal Achievement: 07/28/16 Potential to Achieve Goals: Good  Plan Discharge plan needs to be updated    Co-evaluation                 AM-PAC PT "6 Clicks" Daily Activity     Outcome Measure   Help from another person eating meals?: None Help from another person taking care of personal grooming?: A Little Help from another person toileting, which includes using toliet, bedpan, or urinal?: A Little Help from another person bathing (including washing, rinsing, drying)?: None Help from another person  to put on and taking off regular upper body clothing?: None Help from another person to put on and taking off regular lower body clothing?: None 6 Click Score: 22    End of Session Equipment Utilized During Treatment: Gait belt  OT Visit Diagnosis: Unsteadiness on feet (R26.81);Muscle weakness (generalized) (M62.81)   Activity Tolerance Patient tolerated treatment well   Patient Left in chair;with call bell/phone within reach (MD in room)   Nurse Communication          Time: 5465-6812 OT Time Calculation (min): 13 min  Charges: OT General Charges $OT Visit: 1 Procedure OT Treatments $Self Care/Home Management : 8-22 mins     Malka So 07/15/2016, 11:34 AM  [phone]

## 2016-07-15 NOTE — Progress Notes (Signed)
[name] to be D/C'd Home per MD order.  Discussed with the patient and all questions fully answered.  VSS, Skin clean, dry and intact without evidence of skin break down, no evidence of skin tears noted. IV catheter discontinued intact. Site without signs and symptoms of complications. Dressing and pressure applied.  An After Visit Summary was printed and given to the patient. Patient received prescription.  D/c education completed with patient/family including follow up instructions, medication list, d/c activities limitations if indicated, with other d/c instructions as indicated by MD - patient able to verbalize understanding, all questions fully answered.   Patient instructed to return to ED, call 911, or call MD for any changes in condition.   Patient escorted via [place], and D/C home via private auto.  Luci Bank 07/15/2016 2:17 PM

## 2016-07-15 NOTE — Progress Notes (Signed)
ANTICOAGULATION CONSULT NOTE - Follow-Up Consult  Pharmacy Consult for Warfarin  Indication: atrial fibrillation  Allergies  Allergen Reactions  . Amlodipine Swelling  . Cephalexin Other (See Comments)    unknown  . Codeine Other (See Comments)    unknown  . Metoclopramide Hcl Other (See Comments)    unknown    Vital Signs: Temp: 97.8 F (36.6 C) (07/09 0501) Temp Source: Oral (07/09 0501) BP: 152/57 (07/09 0501) Pulse Rate: 49 (07/09 0501)  Labs:  Recent Labs  07/14/16 0230 07/14/16 0238 07/15/16 0347  HGB 13.8 14.3  --   HCT 42.1 42.0  --   PLT 211  --   --   APTT 33  --   --   LABPROT 28.1*  --  28.0*  INR 2.58  --  2.56  CREATININE 0.80 0.70  --     Estimated Creatinine Clearance: 49 mL/min (by C-G formula based on SCr of 0.7 mg/dL).   Medical History: Past Medical History:  Diagnosis Date  . Arthritis    right  . Atrial fibrillation ([place])   . GERD (gastroesophageal reflux disease)    hx of  . Hepatitis C   . Humerus fracture   . Hyperlipidemia   . Hypertension   . Irritable bowel syndrome   . Meningioma ([place])   . PONV (postoperative nausea and vomiting)     Assessment: 83 y/o F brought in as CODE STROKE with R sided weakness/facial droop and HA. On warfarin PTA for afib, continuing. CBC good. INR remains therapeutic at 2.56.   PTA warfarin dosing: 3 mg on Friday, 6 mg all other days  Goal of Therapy:  INR 2-3 Monitor platelets by anticoagulation protocol: Yes   Plan:  Warfarin 3 mg on Fridays, 6 mg all other days, adjust dose as needed Daily PT/INR Monitor for bleeding   [employer], Pharm.D., BCPS Clinical Pharmacist Pager: [phone] Clinical phone for 07/15/2016 from 8:30-4:00 is [phone]. After 4pm, please call Main Rx ([phone]) for assistance. 07/15/2016 10:54 AM

## 2016-07-15 NOTE — Discharge Summary (Signed)
Physician Discharge Summary  MAVI UN HFW:[id number] DOB: 1930/01/24 DOA: 07/14/2016  PCP: Leanna Battles, MD  Admit date: 07/14/2016 Discharge date: 07/15/2016  Admitted From: Home Disposition: Home  Recommendations for Outpatient Follow-up:  1. Follow up with PCP in 1 week 2. Follow up with neurology in 6 weeks  Home Health: None (patient declined) Equipment/Devices: None  Discharge Condition: Stable CODE STATUS: DNR Diet recommendation: Heart healthy   Brief/Interim Summary:  Admission HPI written by Edwin Dada, MD   Chief Complaint: Headache  HPI: Gloria [name] is a 83 y.o. female with a past medical history significant for Afib on flecainide and warfarin, HTN who presents with severe headache.  The patient was in her usual state of health today, went to bed as usual at 10PM and then around 1AM was awoken by a severe left sided headache, "worst in her life".  She called 9-1-1 who found her with BP 200/100, right facial droop and right sided weakness and transported her.  There was no slurred speech, numbness. No visual changes. At least to me at 4:30 in the morning, the patient states that her right-sided facial droop is permanent since surgery that effective nerve in her face, and that she has had right shoulder, right hip and right knee replacements, and so she is chronically weak on that side.  ED course: -Afebrile, heart rate 52, respirations pulse ox normal, blood pressure 187/65 -Na 137, K 3.5, Cr 0.8, WBC 6.2K, Hgb 13.8 -INR 2.5 -Troponin negative -CT head showed old meningioma, no acute changes -ECG showed stable sinus bradycardia -She arrived as CODE STROKE, which was canceled by Neurology who suspected stroke and recommended stroke work up; per ED report, LP was discussed and deferred by Neurology -She was given compazine and diphenhydramine and her headache mostly resolved    Hospital course:  TIA Symptoms resolved quickly. Workup for  stroke was negative with no stroke identified on MRI. LDL of 106 and atorvastatin was started. Hemoglobin A1c of 5.5. Aspirin given on admission. Patient to continue warfarin for continued stroke prevention. Patient had a recent echo on 06/05/16 significant for an EF of 55-60% with mild LVH. Stroke team evaluated and recommended outpatient follow-up.  Chronic atrial fibrillation CHA2DS2-VASc Score is 6. patient continued on warfarin.  Essential hypertension Significantly elevated at admission. Patient was continued on losartan and HCTZ.  Discharge Diagnoses:  Principal Problem:   TIA (transient ischemic attack) Active Problems:   Hypertension   Atrial fibrillation (HCC)   Nonintractable headache    Discharge Instructions  Discharge Instructions    Call MD for:  extreme fatigue    Complete by:  As directed    Call MD for:  persistant dizziness or light-headedness    Complete by:  As directed    Call MD for:  redness, tenderness, or signs of infection (pain, swelling, redness, odor or green/yellow discharge around incision site)    Complete by:  As directed    Diet - low sodium heart healthy    Complete by:  As directed    Increase activity slowly    Complete by:  As directed      Allergies as of 07/15/2016      Reactions   Amlodipine Swelling   Cephalexin Other (See Comments)   unknown   Codeine Other (See Comments)   unknown   Metoclopramide Hcl Other (See Comments)   unknown      Medication List    TAKE these medications   acetaminophen 500  MG tablet Commonly known as:  TYLENOL Take 1,000 mg by mouth every 6 (six) hours as needed for mild pain.   atorvastatin 40 MG tablet Commonly known as:  LIPITOR Take 1 tablet (40 mg total) by mouth daily at 6 PM.   CALCIUM 600 + D PO Take 1 tablet by mouth 2 (two) times daily.   flecainide 50 MG tablet Commonly known as:  TAMBOCOR Take 1 tablet (50 mg total) by mouth 2 (two) times daily.   hydrochlorothiazide 25 MG  tablet Commonly known as:  HYDRODIURIL TAKE ONE TABLET BY MOUTH ONCE DAILY   ICAPS MV PO Take 1 capsule by mouth 2 (two) times daily.   losartan 25 MG tablet Commonly known as:  COZAAR Take 1 tablet (25 mg total) by mouth daily.   nitroGLYCERIN 0.4 MG SL tablet Commonly known as:  NITROSTAT Place 1 tablet (0.4 mg total) under the tongue every 5 (five) minutes as needed for chest pain.   omeprazole 20 MG capsule Commonly known as:  PRILOSEC Take 20 mg by mouth 2 (two) times daily.   SYSTANE ULTRA OP Apply 1 drop to eye daily as needed (dry eyes).   vitamin B-12 1000 MCG tablet Commonly known as:  CYANOCOBALAMIN Take 1,000 mcg by mouth daily.   warfarin 6 MG tablet Commonly known as:  COUMADIN Take 3-6 tablets by mouth daily. Take 3 mg on Friday all other days take 6 mg      Follow-up Information    Leanna Battles, MD. Schedule an appointment as soon as possible for a visit in 1 week(s).   Specialty:  Internal Medicine Contact information: [place] [address] [phone]          Allergies  Allergen Reactions  . Amlodipine Swelling  . Cephalexin Other (See Comments)    unknown  . Codeine Other (See Comments)    unknown  . Metoclopramide Hcl Other (See Comments)    unknown    Consultations:  Neurology/stroke team   Procedures/Studies: Mr Brain Wo Contrast  Result Date: 07/14/2016 CLINICAL DATA:  TIA.  Right-sided facial droop. EXAM: MRI HEAD WITHOUT CONTRAST MRA HEAD WITHOUT CONTRAST TECHNIQUE: Multiplanar, multiecho pulse sequences of the brain and surrounding structures were obtained without intravenous contrast. Angiographic images of the head were obtained using MRA technique without contrast. COMPARISON:  Head CT from yesterday.  Brain MRI 11/21/2016 FINDINGS: MRI HEAD FINDINGS Brain: No acute infarction, hemorrhage, hydrocephalus, extra-axial collection. Long-standing calcified meningioma at the right occipital pole measuring 15  mm. Mild for age chronic microvascular ischemic gliosis in the cerebral white matter. There have been interval but remote lacunar infarcts in the right centrum semiovale and right caudate head. Normal brain volume for age. Vascular: Arterial findings below. Normal dural venous sinus flow voids. Skull and upper cervical spine: Negative for marrow lesion Sinuses/Orbits: Bilateral cataract resection. New right mastoid air cell fluid/mucosal thickening. Opacified cell at the right petrous apex is chronic and non expansile. Negative nasopharynx. MRA HEAD FINDINGS Symmetric carotid and vertebral arteries. Major vessels are smooth and widely patent. No branch occlusion or aneurysm noted. IMPRESSION: 1. No acute intracranial finding, including infarct. Negative intracranial MRA. 2. Right mastoid opacification that is new from 2015. Negative nasopharynx. 3. Chronic small vessel ischemia with mild progression since 2011. 4. 15 mm right occipital calcified meningioma. Electronically Signed   By: Monte Fantasia M.D.   On: 07/14/2016 10:05   Mr Jodene Nam Head/brain EY Cm  Result Date: 07/14/2016 CLINICAL DATA:  TIA.  Right-sided facial droop. EXAM: MRI HEAD WITHOUT CONTRAST MRA HEAD WITHOUT CONTRAST TECHNIQUE: Multiplanar, multiecho pulse sequences of the brain and surrounding structures were obtained without intravenous contrast. Angiographic images of the head were obtained using MRA technique without contrast. COMPARISON:  Head CT from yesterday.  Brain MRI 11/21/2016 FINDINGS: MRI HEAD FINDINGS Brain: No acute infarction, hemorrhage, hydrocephalus, extra-axial collection. Long-standing calcified meningioma at the right occipital pole measuring 15 mm. Mild for age chronic microvascular ischemic gliosis in the cerebral white matter. There have been interval but remote lacunar infarcts in the right centrum semiovale and right caudate head. Normal brain volume for age. Vascular: Arterial findings below. Normal dural venous sinus  flow voids. Skull and upper cervical spine: Negative for marrow lesion Sinuses/Orbits: Bilateral cataract resection. New right mastoid air cell fluid/mucosal thickening. Opacified cell at the right petrous apex is chronic and non expansile. Negative nasopharynx. MRA HEAD FINDINGS Symmetric carotid and vertebral arteries. Major vessels are smooth and widely patent. No branch occlusion or aneurysm noted. IMPRESSION: 1. No acute intracranial finding, including infarct. Negative intracranial MRA. 2. Right mastoid opacification that is new from 2015. Negative nasopharynx. 3. Chronic small vessel ischemia with mild progression since 2011. 4. 15 mm right occipital calcified meningioma. Electronically Signed   By: Monte Fantasia M.D.   On: 07/14/2016 10:05   Ct Head Code Stroke W/o Cm  Result Date: 07/14/2016 CLINICAL DATA:  Code stroke. Initial evaluation for acute right-sided weakness, facial droop. EXAM: CT HEAD WITHOUT CONTRAST TECHNIQUE: Contiguous axial images were obtained from the base of the skull through the vertex without intravenous contrast. COMPARISON:  Prior CT from 04/28/2013. FINDINGS: Brain: Generalized cerebral atrophy with chronic microvascular ischemic disease, mildly progressed from previous. Small remote lacunar infarct present within the right basal ganglia. No acute intracranial hemorrhage. No evidence for acute large vessel territory infarct. Calcified meningioma at the parasagittal right occipital lobe measures 15 x 17 mm. No associated edema or mass effect. No midline shift. No hydrocephalus. No extra-axial fluid collection. Vascular: No asymmetric hyperdense vessel. Scattered vascular calcifications noted within the carotid siphons. Skull: Scalp soft tissues and calvarium within normal limits. Sinuses/Orbits: Globes and orbital soft tissues within normal limits. Patient status post lens extraction bilaterally. Paranasal sinuses are clear. Trace right mastoid effusion noted. Left mastoid air  cells are clear. ASPECTS [facility] Stroke Program Early CT Score) - Ganglionic level infarction (caudate, lentiform nuclei, internal capsule, insula, M1-M3 cortex): 7 - Supraganglionic infarction (M4-M6 cortex): 3 Total score (0-10 with 10 being normal): 10 IMPRESSION: 1. No acute intracranial infarct or other process identified. 2. ASPECTS is 10. 3. 15 x 17 mm calcified meningioma at the parasagittal right occipital lobe without associated mass effect. 4. Small remote lacunar infarct involving the right caudate head. 5. Mild age-related cerebral atrophy with chronic small vessel ischemic disease, progressed relative to 2015. Critical Value/emergent results were called by telephone at the time of interpretation on 07/14/2016 at 2:50 am to Dr. Armida Sans , who verbally acknowledged these results. Electronically Signed   By: Jeannine Boga M.D.   On: 07/14/2016 03:05      Subjective: Patient reports no issues overnight.  Discharge Exam: Vitals:   07/14/16 2130 07/15/16 0501  BP: (!) 165/55 (!) 152/57  Pulse: (!) 50 (!) 49  Resp: 19 18  Temp:  97.8 F (36.6 C)   Vitals:   07/14/16 0559 07/14/16 1518 07/14/16 2130 07/15/16 0501  BP: (!) 147/49 (!) 155/53 (!) 165/55 (!) 152/57  Pulse: 60 (!) 49 Marland Kitchen)  50 (!) 49  Resp: 18 16 19 18   Temp: 97.7 F (36.5 C) 97.8 F (36.6 C)  97.8 F (36.6 C)  TempSrc: Oral Oral  Oral  SpO2: 97% 97% 97% 93%  Weight:      Height:        General: Pt is alert, awake, not in acute distress Cardiovascular: RRR, S1/S2 +, no rubs, no gallops Respiratory: CTA bilaterally, no wheezing, no rhonchi Abdominal: Soft, NT, ND, bowel sounds + Extremities: no edema, no cyanosis    The results of significant diagnostics from this hospitalization (including imaging, microbiology, ancillary and laboratory) are listed below for reference.     Labs: Basic Metabolic Panel:  Recent Labs Lab 07/14/16 0230 07/14/16 0238  NA 137 139  K 3.5 3.5  CL 102 101  CO2 25  --    GLUCOSE 110* 105*  BUN 11 12  CREATININE 0.80 0.70  CALCIUM 9.4  --    Liver Function Tests:  Recent Labs Lab 07/14/16 0230  AST 25  ALT 17  ALKPHOS 92  BILITOT 0.9  PROT 6.3*  ALBUMIN 4.0   CBC:  Recent Labs Lab 07/14/16 0230 07/14/16 0238  WBC 6.7  --   NEUTROABS 3.6  --   HGB 13.8 14.3  HCT 42.1 42.0  MCV 91.1  --   PLT 211  --    CBG:  Recent Labs Lab 07/14/16 0232  GLUCAP 101*   Hgb A1c  Recent Labs  07/14/16 0536  HGBA1C 5.5   Lipid Profile  Recent Labs  07/14/16 0536  CHOL 178  HDL 58  LDLCALC 106*  TRIG 69  CHOLHDL 3.1   Urinalysis    Component Value Date/Time   COLORURINE YELLOW 05/19/2013 0917   APPEARANCEUR CLOUDY (A) 05/19/2013 0917   LABSPEC 1.012 05/19/2013 [place] 7.5 05/19/2013 0917   GLUCOSEU NEGATIVE 05/19/2013 0917   HGBUR NEGATIVE 05/19/2013 0917   BILIRUBINUR NEGATIVE 05/19/2013 0917   KETONESUR NEGATIVE 05/19/2013 0917   PROTEINUR NEGATIVE 05/19/2013 0917   UROBILINOGEN 0.2 05/19/2013 0917   NITRITE NEGATIVE 05/19/2013 0917   LEUKOCYTESUR NEGATIVE 05/19/2013 0917    Time coordinating discharge: Over 30 minutes  SIGNED:   Cordelia Poche, MD [facility] 07/15/2016, 11:58 AM Pager [phone]  If 7PM-7AM, please contact night-coverage [email or web address] Password [email or web address]

## 2016-07-16 ENCOUNTER — Ambulatory Visit (HOSPITAL_COMMUNITY)
Admission: RE | Admit: 2016-07-16 | Discharge: 2016-07-16 | Disposition: A | Discharge status: Home / Self Care | Payer: Medicare Other | Source: Ambulatory Visit | Attending: Gastroenterology | Admitting: Gastroenterology

## 2016-07-16 DIAGNOSIS — R109 Unspecified abdominal pain: Secondary | ICD-10-CM | POA: Diagnosis not present

## 2016-07-16 DIAGNOSIS — K219 Gastro-esophageal reflux disease without esophagitis: Secondary | ICD-10-CM | POA: Insufficient documentation

## 2016-07-16 DIAGNOSIS — E7800 Pure hypercholesterolemia, unspecified: Secondary | ICD-10-CM | POA: Diagnosis not present

## 2016-07-16 DIAGNOSIS — I10 Essential (primary) hypertension: Secondary | ICD-10-CM | POA: Diagnosis not present

## 2016-07-16 DIAGNOSIS — R1013 Epigastric pain: Secondary | ICD-10-CM

## 2016-07-16 DIAGNOSIS — R0602 Shortness of breath: Secondary | ICD-10-CM | POA: Diagnosis not present

## 2016-07-16 DIAGNOSIS — I480 Paroxysmal atrial fibrillation: Secondary | ICD-10-CM | POA: Diagnosis not present

## 2016-07-16 MED ORDER — TECHNETIUM TC 99M SULFUR COLLOID
2.0100 | Freq: Once | INTRAVENOUS | Status: AC | PRN
  Administered 2016-07-16: 2.01 via INTRAVENOUS

## 2016-07-17 LAB — VAS US CAROTID
LEFT ECA DIAS: -10 cm/s
LEFT VERTEBRAL DIAS: 16 cm/s
Left CCA dist dias: 22 cm/s
Left CCA dist sys: 81 cm/s
Left CCA prox dias: 18 cm/s
Left CCA prox sys: 57 cm/s
Left ICA dist dias: -17 cm/s
Left ICA dist sys: -55 cm/s
Left ICA prox dias: -18 cm/s
Left ICA prox sys: -74 cm/s
RIGHT ECA DIAS: -15 cm/s
RIGHT VERTEBRAL DIAS: -21 cm/s
Right CCA prox dias: -13 cm/s
Right CCA prox sys: -91 cm/s
Right cca dist sys: -65 cm/s

## 2016-07-17 LAB — BASIC METABOLIC PANEL
BUN/Creatinine Ratio: 25 (ref 12–28)
BUN: 21 mg/dL (ref 8–27)
CO2: 23 mmol/L (ref 20–29)
Calcium: 9.9 mg/dL (ref 8.7–10.3)
Chloride: 102 mmol/L (ref 96–106)
Creatinine, Ser: 0.83 mg/dL (ref 0.57–1.00)
GFR calc Af Amer: 75 mL/min/{1.73_m2} (ref >59)
GFR calc non Af Amer: 65 mL/min/{1.73_m2} (ref >59)
Glucose: 174 mg/dL — ABNORMAL HIGH (ref 65–99)
Potassium: 3.8 mmol/L (ref 3.5–5.2)
Sodium: 144 mmol/L (ref 134–144)

## 2016-07-22 DIAGNOSIS — G459 Transient cerebral ischemic attack, unspecified: Secondary | ICD-10-CM | POA: Diagnosis not present

## 2016-07-22 DIAGNOSIS — I10 Essential (primary) hypertension: Secondary | ICD-10-CM | POA: Diagnosis not present

## 2016-07-22 DIAGNOSIS — E784 Other hyperlipidemia: Secondary | ICD-10-CM | POA: Diagnosis not present

## 2016-07-22 DIAGNOSIS — I480 Paroxysmal atrial fibrillation: Secondary | ICD-10-CM | POA: Diagnosis not present

## 2016-07-22 DIAGNOSIS — Z6824 Body mass index (BMI) 24.0-24.9, adult: Secondary | ICD-10-CM | POA: Diagnosis not present

## 2016-08-02 DIAGNOSIS — Z7901 Long term (current) use of anticoagulants: Secondary | ICD-10-CM | POA: Diagnosis not present

## 2016-08-02 DIAGNOSIS — I480 Paroxysmal atrial fibrillation: Secondary | ICD-10-CM | POA: Diagnosis not present

## 2016-08-14 ENCOUNTER — Ambulatory Visit
Admission: RE | Admit: 2016-08-14 | Discharge: 2016-08-14 | Disposition: A | Discharge status: Home / Self Care | Payer: Medicare Other | Source: Ambulatory Visit | Attending: Internal Medicine | Admitting: Internal Medicine

## 2016-08-14 DIAGNOSIS — Z1231 Encounter for screening mammogram for malignant neoplasm of breast: Secondary | ICD-10-CM | POA: Diagnosis not present

## 2016-08-30 DIAGNOSIS — I480 Paroxysmal atrial fibrillation: Secondary | ICD-10-CM | POA: Diagnosis not present

## 2016-08-30 DIAGNOSIS — Z7901 Long term (current) use of anticoagulants: Secondary | ICD-10-CM | POA: Diagnosis not present

## 2016-09-30 ENCOUNTER — Ambulatory Visit: Payer: Self-pay | Admitting: Neurology

## 2016-10-04 DIAGNOSIS — Z7901 Long term (current) use of anticoagulants: Secondary | ICD-10-CM | POA: Diagnosis not present

## 2016-10-04 DIAGNOSIS — Z23 Encounter for immunization: Secondary | ICD-10-CM | POA: Diagnosis not present

## 2016-10-04 DIAGNOSIS — I480 Paroxysmal atrial fibrillation: Secondary | ICD-10-CM | POA: Diagnosis not present

## 2016-11-06 DIAGNOSIS — I480 Paroxysmal atrial fibrillation: Secondary | ICD-10-CM | POA: Diagnosis not present

## 2016-11-06 DIAGNOSIS — Z7901 Long term (current) use of anticoagulants: Secondary | ICD-10-CM | POA: Diagnosis not present

## 2016-12-03 ENCOUNTER — Ambulatory Visit (INDEPENDENT_AMBULATORY_CARE_PROVIDER_SITE_OTHER): Payer: Medicare Other

## 2016-12-03 ENCOUNTER — Encounter: Payer: Self-pay | Admitting: Podiatry

## 2016-12-03 ENCOUNTER — Ambulatory Visit (INDEPENDENT_AMBULATORY_CARE_PROVIDER_SITE_OTHER): Payer: Medicare Other | Admitting: Podiatry

## 2016-12-03 VITALS — BP 187/97 | HR 51

## 2016-12-03 DIAGNOSIS — S90211A Contusion of right great toe with damage to nail, initial encounter: Secondary | ICD-10-CM | POA: Diagnosis not present

## 2016-12-03 DIAGNOSIS — S90221A Contusion of right lesser toe(s) with damage to nail, initial encounter: Secondary | ICD-10-CM

## 2016-12-03 DIAGNOSIS — M79674 Pain in right toe(s): Secondary | ICD-10-CM | POA: Diagnosis not present

## 2016-12-03 NOTE — Progress Notes (Signed)
   Subjective:    Patient ID: Gloria [name], female    DOB: Nov 13, 1930, 83 y.o.   MRN: [id number]  HPI This patient presents today complaining of discomfort in the third right toe and toenail area. Patient describes approximately 3 weeks ago stubbing her toe causing immediate discomfort. Patient said that she had no shoe and describes a significant painful event at that time. Since then the nail has loosened from the nail bed in the distal toe at is painful as well as in or around the nail area. Patient has used activity to accommodate to the discomfort which is been rather persistent since the injury. Patient is on Coumadin therapy and treatment of TIA and atrial fibrillation Patient denies smoking history The patient's daughter is present in the treatment room today   Review of Systems  All other systems reviewed and are negative.      Objective:   Physical Exam  Pleasant orientated 3  Vascular: No calf edema or calf tenderness bilaterally DP pulses 2/4 bilaterally PT pulses 2/4 bilaterally Reflex within normal limits bilaterally  Neurological: Sensation to 10 g monofilament wire intact 8/8 right and 7/8 left Vibratory sensation nonreactive bilaterally Ankle reflexes reactive bilaterally  Dermatological: No open skin lesions bilaterally There is a low-grade edema at the posterior nail fold, right hallux The lateral aspect of the right hallux nail is lifting off the nailbed without active drainage There is no warmth or malodor noted in the right hallux or nail area  Musculoskeletal: No restriction ankle, subtalar, midtarsal joints bilaterally Palpable tenderness distal right hallux  X-ray examination weightbearing right foot dated 12/03/2016  Intact bony structures fracture and/or dislocation noted with particular attention a plate of the right hallux  Radiographic impression: No acute bony abnormality noted in x-ray the right foot dated 12/03/2016         Assessment & Plan:   Assessment: Contusion right hallux Mild subungual hematoma right hallux Patient had Coumadin therapy, blood clotting disorder  Plan: I reviewed the results of the x-ray exam with patient today. I recommended as patient is on Coumadin like to avoid total nail avulsion. I debrided the lateral aspect of the nail to the posterior nail fold without any active relieves of drainage. I recommended Epsom salts or soft soap soaks and application of triple antibiotic ointment and a Band-Aid until healed. Instructed patient returned if the symptoms do not improve over time. In the event that nail avulsion would be indicated Coumadin will be discontinued if given medical clearance  Reappoint at patient's request

## 2016-12-03 NOTE — Patient Instructions (Signed)
Exam today demonstrated no fracture in the right great toe I debrided portion of the nail off the nailbed Please apply triple antibiotic ointment and a Band-Aid daily to the nailbed until healed Soaking his optionally in Epsom salts or soft soap Return as needed

## 2016-12-11 DIAGNOSIS — I480 Paroxysmal atrial fibrillation: Secondary | ICD-10-CM | POA: Diagnosis not present

## 2016-12-11 DIAGNOSIS — F419 Anxiety disorder, unspecified: Secondary | ICD-10-CM | POA: Diagnosis not present

## 2016-12-11 DIAGNOSIS — Z7901 Long term (current) use of anticoagulants: Secondary | ICD-10-CM | POA: Diagnosis not present

## 2017-01-08 ENCOUNTER — Other Ambulatory Visit: Payer: Self-pay | Admitting: Podiatry

## 2017-01-08 DIAGNOSIS — S90221A Contusion of right lesser toe(s) with damage to nail, initial encounter: Secondary | ICD-10-CM

## 2017-01-20 ENCOUNTER — Ambulatory Visit: Payer: Medicare Other | Admitting: Cardiology

## 2017-01-21 NOTE — Progress Notes (Signed)
Gloria [name] Date of Birth: 1930/06/03   History of Present Illness: Gloria [name] seen today for followup paroxysmal afib. She has a history of atrial fibrillation controlled with flecainide. She is on chronic anticoagulation with coumadin. She tried Xarelto in the past but this upset her stomach.   She was admitted in May 2018 with symptoms of chest pressure and SOB. She ruled out for MI. Echo showed moderate LVH and LAE with normal systolic function. Myoview study was normal. She was placed on amlodipine for HTN. Subsequently she developed lower extremity swelling with a rash and red blotches. Amlodipine was stopped and this resolved.   On follow up today she is seen with her daughter. She is generally doing well. She  denies any symptoms of palpitations. She denies any chest pain or SOB.   She reports INRs have been therapeutic. BP is well controlled. Her activity has been more limited due to the weather.  Current Outpatient Medications on File Prior to Visit  Medication Sig Dispense Refill  . acetaminophen (TYLENOL) 500 MG tablet Take 1,000 mg by mouth every 6 (six) hours as needed for mild pain.    Marland Kitchen atorvastatin (LIPITOR) 40 MG tablet Take 1 tablet (40 mg total) by mouth daily at 6 PM. 30 tablet 0  . Calcium Carb-Cholecalciferol (CALCIUM 600 + D PO) Take 1 tablet by mouth 2 (two) times daily.    . flecainide (TAMBOCOR) 50 MG tablet Take 1 tablet (50 mg total) by mouth 2 (two) times daily. 180 tablet 1  . hydrochlorothiazide (HYDRODIURIL) 25 MG tablet TAKE ONE TABLET BY MOUTH ONCE DAILY 90 tablet 2  . losartan (COZAAR) 25 MG tablet Take 1 tablet (25 mg total) by mouth daily. 90 tablet 3  . Multiple Vitamins-Minerals (ICAPS MV PO) Take 1 capsule by mouth 2 (two) times daily.     . nitroGLYCERIN (NITROSTAT) 0.4 MG SL tablet Place 1 tablet (0.4 mg total) under the tongue every 5 (five) minutes as needed for chest pain. 30 tablet 0  . omeprazole (PRILOSEC) 20 MG capsule Take 20 mg by  mouth 2 (two) times daily.     Vladimir Faster Glycol-Propyl Glycol (SYSTANE ULTRA OP) Apply 1 drop to eye daily as needed (dry eyes).     . vitamin B-12 (CYANOCOBALAMIN) 1000 MCG tablet Take 1,000 mcg by mouth daily.    Marland Kitchen warfarin (COUMADIN) 6 MG tablet Take 3-6 tablets by mouth daily. Take 3 mg on Friday all other days take 6 mg     No current facility-administered medications on file prior to visit.     Allergies  Allergen Reactions  . Amlodipine Swelling  . Cephalexin Other (See Comments)    unknown  . Codeine Other (See Comments)    unknown  . Metoclopramide Hcl Other (See Comments)    unknown    Past Medical History:  Diagnosis Date  . Arthritis    right  . Atrial fibrillation ([place])   . GERD (gastroesophageal reflux disease)    hx of  . Hepatitis C   . Humerus fracture   . Hyperlipidemia   . Hypertension   . Irritable bowel syndrome   . Meningioma ([place])   . PONV (postoperative nausea and vomiting)     Past Surgical History:  Procedure Laterality Date  . BREAST SURGERY     lumpectomy  . CARDIOVASCULAR STRESS TEST  11/14/2006   EF 67%  . COLONOSCOPY W/ BIOPSIES AND POLYPECTOMY    . ORIF HUMERUS FRACTURE Right 04/18/2014  Procedure: OPEN REDUCTION INTERNAL FIXATION (ORIF) RIGHT PROXIMAL HUMERUS FRACTURE;  Surgeon: Netta Cedars, MD;  Location: [facility];  Service: Orthopedics;  Laterality: Right;  . ORIF PERIPROSTHETIC FRACTURE Left 04/30/2013   Procedure: OPEN REDUCTION INTERNAL FIXATION (ORIF) PERIPROSTHETIC FRACTURE;  Surgeon: [name], MD;  Location: [facility];  Service: Orthopedics;  Laterality: Left;  . PAROTID GLAND TUMOR EXCISION    . TOTAL HIP ARTHROPLASTY  11/07   left  . TOTAL KNEE ARTHROPLASTY    . TRANSTHORACIC ECHOCARDIOGRAM  10/25/2009   EF 55-60%    Social History   Tobacco Use  Smoking Status Former Smoker  . Packs/day: 0.30  . Years: 15.00  . Pack years: 4.50  . Types: Cigarettes  . Last attempt to quit: 06/07/1980  . Years since quitting: 36.6   Smokeless Tobacco Never Used    Social History   Substance and Sexual Activity  Alcohol Use No    Family History  Problem Relation Age of Onset  . Heart disease Brother     Review of Systems: As noted in history of present illness.  All other systems were reviewed and are negative.  Physical Exam: BP 130/74   Pulse (!) 56   Ht 5\' 6"  (1.676 m)   Wt 149 lb 8 oz (67.8 kg)   BMI 24.13 kg/m  GENERAL:  Well appearing, elderly WF  HEENT:  PERRL, EOMI, sclera are clear. Oropharynx is clear. NECK:  No jugular venous distention, carotid upstroke brisk and symmetric, no bruits, no thyromegaly or adenopathy LUNGS:  Clear to auscultation bilaterally CHEST:  Unremarkable HEART:  RRR,  PMI not displaced or sustained,S1 and S2 within normal limits, no S3, no S4: no clicks, no rubs, no murmurs ABD:  Soft, nontender. BS +, no masses or bruits. No hepatomegaly, no splenomegaly EXT:  2 + pulses throughout, no edema, no cyanosis no clubbing SKIN:  Warm and dry.  No rashes NEURO:  Alert and oriented x 3. Cranial nerves II through XII intact. PSYCH:  Cognitively intact     LABORATORY DATA: Lab Results  Component Value Date   WBC 6.7 07/14/2016   HGB 14.3 07/14/2016   HCT 42.0 07/14/2016   PLT 211 07/14/2016   GLUCOSE 174 (H) 07/16/2016   CHOL 178 07/14/2016   TRIG 69 07/14/2016   HDL 58 07/14/2016   LDLCALC 106 (H) 07/14/2016   ALT 17 07/14/2016   AST 25 07/14/2016   NA 144 07/16/2016   K 3.8 07/16/2016   CL 102 07/16/2016   CREATININE 0.83 07/16/2016   BUN 21 07/16/2016   CO2 23 07/16/2016   TSH 2.129 06/05/2016   INR 2.56 07/15/2016   HGBA1C 5.5 07/14/2016   Labs dated 03/20/16: cholesterol 200, triglycerides 75, LDL 116, HDL 69. CMET is normal.  Myoview 06/05/16: Study Result     The study is normal.  This is a low risk study.  The left ventricular ejection fraction is hyperdynamic (>65%).  There was no ST segment deviation noted during stress.   Normal  resting and stress perfusion. No ischemia or infarction EF 72%    Echo 06/05/16: Study Conclusions  - Left ventricle: The cavity size was normal. Wall thickness was   increased in a pattern of mild LVH. There was moderate concentric   hypertrophy. Systolic function was normal. The estimated ejection   fraction was in the range of 55% to 60%. - Left atrium: The atrium was moderately dilated. - Atrial septum: No defect or patent foramen ovale  was identified.  Assessment / Plan: 1. Atrial fibrillation- paroxysmal. This is well controlled on low dose flecainide. Appears to be in NSR today.We will continue her current therapy.   2. Chronic anticoagulation, therapeutic. Continue Coumadin.  3. Hypertension,   well controlled. Continue losartan and HCT.     I will follow up in 6 months

## 2017-01-22 DIAGNOSIS — Z7901 Long term (current) use of anticoagulants: Secondary | ICD-10-CM | POA: Diagnosis not present

## 2017-01-22 DIAGNOSIS — I480 Paroxysmal atrial fibrillation: Secondary | ICD-10-CM | POA: Diagnosis not present

## 2017-01-23 ENCOUNTER — Encounter: Payer: Self-pay | Admitting: Cardiology

## 2017-01-23 ENCOUNTER — Ambulatory Visit (INDEPENDENT_AMBULATORY_CARE_PROVIDER_SITE_OTHER): Payer: Medicare Other | Admitting: Cardiology

## 2017-01-23 VITALS — BP 130/74 | HR 56 | Ht 66.0 in | Wt 149.5 lb

## 2017-01-23 DIAGNOSIS — Z7901 Long term (current) use of anticoagulants: Secondary | ICD-10-CM | POA: Diagnosis not present

## 2017-01-23 DIAGNOSIS — E7800 Pure hypercholesterolemia, unspecified: Secondary | ICD-10-CM | POA: Diagnosis not present

## 2017-01-23 DIAGNOSIS — I480 Paroxysmal atrial fibrillation: Secondary | ICD-10-CM | POA: Diagnosis not present

## 2017-01-23 NOTE — Patient Instructions (Addendum)
Continue your current therapy  I will see you in 6 months.   

## 2017-02-06 ENCOUNTER — Ambulatory Visit: Payer: Medicare Other | Admitting: Cardiology

## 2017-03-06 DIAGNOSIS — I480 Paroxysmal atrial fibrillation: Secondary | ICD-10-CM | POA: Diagnosis not present

## 2017-03-06 DIAGNOSIS — Z7901 Long term (current) use of anticoagulants: Secondary | ICD-10-CM | POA: Diagnosis not present

## 2017-03-17 ENCOUNTER — Other Ambulatory Visit: Payer: Self-pay | Admitting: Cardiology

## 2017-03-20 DIAGNOSIS — M859 Disorder of bone density and structure, unspecified: Secondary | ICD-10-CM | POA: Diagnosis not present

## 2017-03-20 DIAGNOSIS — E7849 Other hyperlipidemia: Secondary | ICD-10-CM | POA: Diagnosis not present

## 2017-03-20 DIAGNOSIS — I10 Essential (primary) hypertension: Secondary | ICD-10-CM | POA: Diagnosis not present

## 2017-03-20 DIAGNOSIS — R82998 Other abnormal findings in urine: Secondary | ICD-10-CM | POA: Diagnosis not present

## 2017-03-26 DIAGNOSIS — Z6825 Body mass index (BMI) 25.0-25.9, adult: Secondary | ICD-10-CM | POA: Diagnosis not present

## 2017-03-26 DIAGNOSIS — M25511 Pain in right shoulder: Secondary | ICD-10-CM | POA: Diagnosis not present

## 2017-03-26 DIAGNOSIS — I10 Essential (primary) hypertension: Secondary | ICD-10-CM | POA: Diagnosis not present

## 2017-03-26 DIAGNOSIS — I480 Paroxysmal atrial fibrillation: Secondary | ICD-10-CM | POA: Diagnosis not present

## 2017-03-26 DIAGNOSIS — M859 Disorder of bone density and structure, unspecified: Secondary | ICD-10-CM | POA: Diagnosis not present

## 2017-03-26 DIAGNOSIS — Z1389 Encounter for screening for other disorder: Secondary | ICD-10-CM | POA: Diagnosis not present

## 2017-03-26 DIAGNOSIS — E7849 Other hyperlipidemia: Secondary | ICD-10-CM | POA: Diagnosis not present

## 2017-03-26 DIAGNOSIS — Z7901 Long term (current) use of anticoagulants: Secondary | ICD-10-CM | POA: Diagnosis not present

## 2017-03-26 DIAGNOSIS — F4321 Adjustment disorder with depressed mood: Secondary | ICD-10-CM | POA: Diagnosis not present

## 2017-03-26 DIAGNOSIS — K219 Gastro-esophageal reflux disease without esophagitis: Secondary | ICD-10-CM | POA: Diagnosis not present

## 2017-03-26 DIAGNOSIS — Z0000 Encounter for general adult medical examination without abnormal findings: Secondary | ICD-10-CM | POA: Diagnosis not present

## 2017-04-02 DIAGNOSIS — Z7901 Long term (current) use of anticoagulants: Secondary | ICD-10-CM | POA: Diagnosis not present

## 2017-04-02 DIAGNOSIS — I480 Paroxysmal atrial fibrillation: Secondary | ICD-10-CM | POA: Diagnosis not present

## 2017-04-15 DIAGNOSIS — R198 Other specified symptoms and signs involving the digestive system and abdomen: Secondary | ICD-10-CM | POA: Diagnosis not present

## 2017-04-15 DIAGNOSIS — K219 Gastro-esophageal reflux disease without esophagitis: Secondary | ICD-10-CM | POA: Diagnosis not present

## 2017-04-15 DIAGNOSIS — I4891 Unspecified atrial fibrillation: Secondary | ICD-10-CM | POA: Diagnosis not present

## 2017-04-15 DIAGNOSIS — R1013 Epigastric pain: Secondary | ICD-10-CM | POA: Diagnosis not present

## 2017-04-15 DIAGNOSIS — Z7901 Long term (current) use of anticoagulants: Secondary | ICD-10-CM | POA: Diagnosis not present

## 2017-04-30 DIAGNOSIS — I480 Paroxysmal atrial fibrillation: Secondary | ICD-10-CM | POA: Diagnosis not present

## 2017-04-30 DIAGNOSIS — K3184 Gastroparesis: Secondary | ICD-10-CM | POA: Diagnosis not present

## 2017-04-30 DIAGNOSIS — Z7901 Long term (current) use of anticoagulants: Secondary | ICD-10-CM | POA: Diagnosis not present

## 2017-05-26 DIAGNOSIS — K219 Gastro-esophageal reflux disease without esophagitis: Secondary | ICD-10-CM | POA: Diagnosis not present

## 2017-05-26 DIAGNOSIS — Z7901 Long term (current) use of anticoagulants: Secondary | ICD-10-CM | POA: Diagnosis not present

## 2017-05-26 DIAGNOSIS — I480 Paroxysmal atrial fibrillation: Secondary | ICD-10-CM | POA: Diagnosis not present

## 2017-06-20 ENCOUNTER — Other Ambulatory Visit: Payer: Self-pay | Admitting: Cardiology

## 2017-06-20 NOTE — Telephone Encounter (Signed)
Rx request sent to pharmacy.  

## 2017-07-02 DIAGNOSIS — I480 Paroxysmal atrial fibrillation: Secondary | ICD-10-CM | POA: Diagnosis not present

## 2017-07-02 DIAGNOSIS — Z7901 Long term (current) use of anticoagulants: Secondary | ICD-10-CM | POA: Diagnosis not present

## 2017-07-08 ENCOUNTER — Other Ambulatory Visit: Payer: Self-pay | Admitting: Internal Medicine

## 2017-07-08 DIAGNOSIS — Z1231 Encounter for screening mammogram for malignant neoplasm of breast: Secondary | ICD-10-CM

## 2017-07-14 DIAGNOSIS — H6123 Impacted cerumen, bilateral: Secondary | ICD-10-CM | POA: Diagnosis not present

## 2017-07-14 DIAGNOSIS — R05 Cough: Secondary | ICD-10-CM | POA: Diagnosis not present

## 2017-07-14 DIAGNOSIS — R0789 Other chest pain: Secondary | ICD-10-CM | POA: Diagnosis not present

## 2017-07-14 DIAGNOSIS — Z6825 Body mass index (BMI) 25.0-25.9, adult: Secondary | ICD-10-CM | POA: Diagnosis not present

## 2017-07-14 DIAGNOSIS — J019 Acute sinusitis, unspecified: Secondary | ICD-10-CM | POA: Diagnosis not present

## 2017-07-28 ENCOUNTER — Encounter (HOSPITAL_COMMUNITY): Payer: Self-pay

## 2017-07-28 ENCOUNTER — Other Ambulatory Visit: Payer: Self-pay

## 2017-07-28 ENCOUNTER — Emergency Department (HOSPITAL_COMMUNITY)
Admission: EM | Admit: 2017-07-28 | Discharge: 2017-07-28 | Disposition: A | Discharge status: Home / Self Care | Payer: Medicare Other | Attending: Emergency Medicine | Admitting: Emergency Medicine

## 2017-07-28 ENCOUNTER — Emergency Department (HOSPITAL_COMMUNITY): Payer: Medicare Other

## 2017-07-28 DIAGNOSIS — S2231XA Fracture of one rib, right side, initial encounter for closed fracture: Secondary | ICD-10-CM | POA: Insufficient documentation

## 2017-07-28 DIAGNOSIS — J019 Acute sinusitis, unspecified: Secondary | ICD-10-CM | POA: Diagnosis not present

## 2017-07-28 DIAGNOSIS — W19XXXA Unspecified fall, initial encounter: Secondary | ICD-10-CM | POA: Diagnosis not present

## 2017-07-28 DIAGNOSIS — Y999 Unspecified external cause status: Secondary | ICD-10-CM | POA: Diagnosis not present

## 2017-07-28 DIAGNOSIS — Z79899 Other long term (current) drug therapy: Secondary | ICD-10-CM | POA: Diagnosis not present

## 2017-07-28 DIAGNOSIS — I10 Essential (primary) hypertension: Secondary | ICD-10-CM | POA: Insufficient documentation

## 2017-07-28 DIAGNOSIS — Z96642 Presence of left artificial hip joint: Secondary | ICD-10-CM | POA: Diagnosis not present

## 2017-07-28 DIAGNOSIS — S79911A Unspecified injury of right hip, initial encounter: Secondary | ICD-10-CM | POA: Diagnosis not present

## 2017-07-28 DIAGNOSIS — W010XXA Fall on same level from slipping, tripping and stumbling without subsequent striking against object, initial encounter: Secondary | ICD-10-CM | POA: Diagnosis not present

## 2017-07-28 DIAGNOSIS — S40211A Abrasion of right shoulder, initial encounter: Secondary | ICD-10-CM | POA: Diagnosis not present

## 2017-07-28 DIAGNOSIS — Y939 Activity, unspecified: Secondary | ICD-10-CM | POA: Diagnosis not present

## 2017-07-28 DIAGNOSIS — Z96659 Presence of unspecified artificial knee joint: Secondary | ICD-10-CM | POA: Diagnosis not present

## 2017-07-28 DIAGNOSIS — S20301A Unspecified superficial injuries of right front wall of thorax, initial encounter: Secondary | ICD-10-CM | POA: Diagnosis present

## 2017-07-28 DIAGNOSIS — Z8673 Personal history of transient ischemic attack (TIA), and cerebral infarction without residual deficits: Secondary | ICD-10-CM | POA: Diagnosis not present

## 2017-07-28 DIAGNOSIS — Y929 Unspecified place or not applicable: Secondary | ICD-10-CM | POA: Insufficient documentation

## 2017-07-28 DIAGNOSIS — Z7901 Long term (current) use of anticoagulants: Secondary | ICD-10-CM | POA: Insufficient documentation

## 2017-07-28 DIAGNOSIS — M25551 Pain in right hip: Secondary | ICD-10-CM | POA: Diagnosis not present

## 2017-07-28 DIAGNOSIS — I4891 Unspecified atrial fibrillation: Secondary | ICD-10-CM | POA: Diagnosis not present

## 2017-07-28 DIAGNOSIS — Z87891 Personal history of nicotine dependence: Secondary | ICD-10-CM | POA: Insufficient documentation

## 2017-07-28 DIAGNOSIS — I480 Paroxysmal atrial fibrillation: Secondary | ICD-10-CM | POA: Diagnosis not present

## 2017-07-28 DIAGNOSIS — M25511 Pain in right shoulder: Secondary | ICD-10-CM | POA: Diagnosis not present

## 2017-07-28 DIAGNOSIS — R112 Nausea with vomiting, unspecified: Secondary | ICD-10-CM | POA: Diagnosis not present

## 2017-07-28 DIAGNOSIS — Z6825 Body mass index (BMI) 25.0-25.9, adult: Secondary | ICD-10-CM | POA: Diagnosis not present

## 2017-07-28 DIAGNOSIS — R05 Cough: Secondary | ICD-10-CM | POA: Diagnosis not present

## 2017-07-28 MED ORDER — OXYCODONE-ACETAMINOPHEN 5-325 MG PO TABS: 1.0000 | ORAL_TABLET | Freq: Four times a day (QID) | ORAL | 0 refills | Status: DC | PRN

## 2017-07-28 MED ORDER — OXYCODONE-ACETAMINOPHEN 5-325 MG PO TABS
1.0000 | ORAL_TABLET | Freq: Once | ORAL | Status: AC
  Administered 2017-07-28: 1 via ORAL
  Filled 2017-07-28: qty 1

## 2017-07-28 MED ORDER — FENTANYL CITRATE (PF) 100 MCG/2ML IJ SOLN
50.0000 ug | Freq: Once | INTRAMUSCULAR | Status: AC
  Administered 2017-07-28: 50 ug via INTRAVENOUS
  Filled 2017-07-28: qty 2

## 2017-07-28 NOTE — ED Notes (Signed)
Patient verbalizes understanding of discharge instructions. Opportunity for questioning and answers were provided. Armband removed by staff, pt discharged from ED.  

## 2017-07-28 NOTE — ED Triage Notes (Signed)
Per [facility] pt was home alone. Pt was outside and tripped over threshold. Pt fell on right ribs. Pt reports did not hit head. Pt has hx of right hip replacement. Pt report 8/10 pain to right ribs and dull ache 3/10 to right hip.

## 2017-07-28 NOTE — ED Notes (Signed)
Pt able to get out of bed and walk to bedside commode with ease

## 2017-07-28 NOTE — ED Notes (Signed)
ED Provider at bedside. 

## 2017-07-28 NOTE — Discharge Instructions (Addendum)
Thank you for allowing me to care for you today in the Emergency Department.   Please keep your follow-up appointment with your primary care provider on Thursday.  For mild to moderate pain, take 650 mg of Tylenol every 6 hours.  For severe pain, you may take 1 tablet of Percocet every 6 hours.  Do not drive while taking this medication because it is a narcotic and can cause you to be impaired.  Use the incentive spirometer as you have been taught in the emergency department.  Return to the emergency department if you develop severe shortness of breath, chest pain, high fever, or other new, concerning symptoms.

## 2017-07-29 NOTE — ED Provider Notes (Addendum)
[facility] EMERGENCY DEPARTMENT Provider Note   CSN: [id number] Arrival date & time: 07/28/17  1850     History   Chief Complaint Chief Complaint  Patient presents with  . Fall    HPI Gloria [name] is a 84 y.o. female with a history of atrial fibrillation on Coumadin, HTN, HLD, right humerus fracture, and arthritis who presents to the emergency department with by EMS a chief complaint of fall.  The patient endorses a mechanical fall from standing onto concrete.  She fell on her right side.  She denies hitting her head, LOC, nausea, or emesis.  She is endorsing right hip, ribs, and shoulder pain.  Pain is 8 out of 10.  No treatment prior to arrival.  She has not attempted to walk since the fall.  Family reports a superficial abrasion to the patient's right posterior shoulder.  She denies left leg or arm pain, numbness, weakness, visual changes, back pain, neck pain, abdominal pain, or dyspnea.  She reports that she had her Coumadin level checked earlier today, which was 2.4.  She was seen by her PCP and started on prednisone and doxycycline for sinusitis.  She has a follow-up appointment again in 3 days for recheck.  The history is provided by the patient and the EMS personnel. No language interpreter was used.    Past Medical History:  Diagnosis Date  . Arthritis    right  . Atrial fibrillation ([place])   . GERD (gastroesophageal reflux disease)    hx of  . Hepatitis C   . Humerus fracture   . Hyperlipidemia   . Hypertension   . Irritable bowel syndrome   . Meningioma ([place])   . PONV (postoperative nausea and vomiting)     Patient Active Problem List   Diagnosis Date Noted  . TIA (transient ischemic attack) 07/14/2016  . Nonintractable headache   . Warfarin anticoagulation 04/09/2011    Class: Chronic  . Hypertension   . Irritable bowel syndrome   . Atrial fibrillation [facility])     Past Surgical History:  Procedure Laterality Date  . BREAST  SURGERY     lumpectomy  . CARDIOVASCULAR STRESS TEST  11/14/2006   EF 67%  . COLONOSCOPY W/ BIOPSIES AND POLYPECTOMY    . ORIF HUMERUS FRACTURE Right 04/18/2014   Procedure: OPEN REDUCTION INTERNAL FIXATION (ORIF) RIGHT PROXIMAL HUMERUS FRACTURE;  Surgeon: Netta Cedars, MD;  Location: [facility];  Service: Orthopedics;  Laterality: Right;  . ORIF PERIPROSTHETIC FRACTURE Left 04/30/2013   Procedure: OPEN REDUCTION INTERNAL FIXATION (ORIF) PERIPROSTHETIC FRACTURE;  Surgeon: [name], MD;  Location: [facility];  Service: Orthopedics;  Laterality: Left;  . PAROTID GLAND TUMOR EXCISION    . TOTAL HIP ARTHROPLASTY  11/07   left  . TOTAL KNEE ARTHROPLASTY    . TRANSTHORACIC ECHOCARDIOGRAM  10/25/2009   EF 55-60%     OB History   None      Home Medications    Prior to Admission medications   Medication Sig Start Date End Date Taking? Authorizing Provider  acetaminophen (TYLENOL) 500 MG tablet Take 1,000 mg by mouth every 6 (six) hours as needed for mild pain.   Yes [provider]  atorvastatin (LIPITOR) 40 MG tablet Take 1 tablet (40 mg total) by mouth daily at 6 PM. 07/15/16  Yes Mariel Aloe, MD  Calcium Carb-Cholecalciferol (CALCIUM 600 + D PO) Take 1 tablet by mouth 2 (two) times daily.   Yes [provider]  doxycycline (VIBRA-TABS) 100 MG tablet Take 100 mg by mouth 2 (two) times daily.   Yes [provider]  flecainide (TAMBOCOR) 50 MG tablet Take 1 tablet (50 mg total) by mouth 2 (two) times daily. 04/29/11  Yes Martinique, Peter M, MD  fluticasone [facility]) 50 MCG/ACT nasal spray Place 1 spray into both nostrils daily.   Yes [provider]  hydrochlorothiazide (HYDRODIURIL) 25 MG tablet TAKE 1 TABLET BY MOUTH ONCE DAILY 03/17/17  Yes Martinique, Peter M, MD  losartan (COZAAR) 25 MG tablet TAKE 1 TABLET BY MOUTH ONCE DAILY 06/20/17  Yes Martinique, Peter M, MD  Multiple Vitamins-Minerals (ICAPS MV PO) Take 1 capsule by mouth 2 (two) times daily.    Yes [provider]  nitroGLYCERIN (NITROSTAT) 0.4 MG SL tablet Place 1 tablet (0.4 mg total) under the tongue every 5 (five) minutes as needed for chest pain. 06/05/16  Yes Florencia Reasons, MD  omeprazole (PRILOSEC) 20 MG capsule Take 20 mg by mouth 2 (two) times daily.    Yes [provider]  Polyethyl Glycol-Propyl Glycol (SYSTANE ULTRA OP) Apply 1 drop to eye daily as needed (dry eyes).    Yes [provider]  predniSONE (DELTASONE) 5 MG tablet Take 5-30 mg by mouth daily with breakfast. Take 6 on day 1 then 5 tablets on day 2. Take 4 tablets on day 3, then 3 tablets on day 4. Take 2 tablet on day 5, then take 1 tablet on day 6.   Yes [provider]  vitamin B-12 (CYANOCOBALAMIN) 1000 MCG tablet Take 1,000 mcg by mouth daily.   Yes [provider]  warfarin (COUMADIN) 6 MG tablet Take 3-6 tablets by mouth daily. Take 3 mg on Friday all other days take 6 mg 08/16/14  Yes [provider]  oxyCODONE-acetaminophen (PERCOCET/ROXICET) 5-325 MG tablet Take 1 tablet by mouth every 6 (six) hours as needed for severe pain. 07/28/17   Hasson Gaspard A, PA-C    Family History Family History  Problem Relation Age of Onset  . Heart disease Brother     Social History Social History   Tobacco Use  . Smoking status: Former Smoker    Packs/day: 0.30    Years: 15.00    Pack years: 4.50    Types: Cigarettes    Last attempt to quit: 06/07/1980    Years since quitting: 37.1  . Smokeless tobacco: Never Used  Substance Use Topics  . Alcohol use: No  . Drug use: No     Allergies   Amlodipine; Cephalexin; Codeine; Metoclopramide hcl; and Penicillins   Review of Systems Review of Systems  Constitutional: Negative for activity change, chills and fever.  HENT: Negative for congestion and sore throat.   Eyes: Positive for visual disturbance.  Respiratory: Negative for shortness of breath.   Cardiovascular: Negative for chest pain.  Gastrointestinal: Negative for  abdominal pain, diarrhea, nausea and vomiting.  Genitourinary: Negative for dysuria.  Musculoskeletal: Positive for arthralgias, gait problem and myalgias. Negative for back pain and neck pain.  Skin: Positive for wound. Negative for rash.  Allergic/Immunologic: Negative for immunocompromised state.  Neurological: Negative for seizures, syncope, weakness, numbness and headaches.  Psychiatric/Behavioral: Negative for confusion.     Physical Exam Updated Vital Signs BP (!) 155/92 (BP Location: Left Arm)   Pulse 82   Temp 98.8 F (37.1 C) (Oral)   Resp 18   Ht 5\' 4"  (1.626 m)   Wt 67.1 kg (148 lb)   SpO2 94%  BMI 25.40 kg/m   Physical Exam  Constitutional: No distress.  HENT:  Head: Normocephalic.  Eyes: Pupils are equal, round, and reactive to light. Conjunctivae and EOM are normal.  Neck: Neck supple.  Cardiovascular: Normal rate and regular rhythm. Exam reveals no gallop and no friction rub.  No murmur heard. Pulmonary/Chest: Effort normal. No stridor. No respiratory distress. She has no wheezes. She has no rales. She exhibits tenderness.  Abdominal: Soft. Bowel sounds are normal. She exhibits no distension and no mass. There is no tenderness. There is no rebound and no guarding. No hernia.  Abdomen is soft, nontender, nondistended.  Musculoskeletal: She exhibits tenderness.  Tender to palpation over the right lateral ribs.  Tender to palpation over the right shoulder diffusely.  Tender to palpation over the right hip, right posterior pelvis, and right proximal femur.  No left hip tenderness.  Left lower extremity exam is unremarkable.  Left upper extremity exam is unremarkable.  5 out of 5 strength against resistance of the bilateral upper and lower extremities.  Sensation is intact and symmetric throughout.  DP, PT, and radial pulses are 2+ and symmetric.  Neurological: She is alert.  Skin: Skin is warm. No rash noted.  Superficial, hemostatic abrasion to the right  posterior shoulder.  No obvious foreign bodies.  Wound appears clean.  3 x 3 area of ecchymosis noted to the superior right buttock.  Psychiatric: Her behavior is normal.  Nursing note and vitals reviewed.    ED Treatments / Results  Labs (all labs ordered are listed, but only abnormal results are displayed) Labs Reviewed - No data to display  EKG None  Radiology Dg Ribs Unilateral W/chest Right  Result Date: 07/28/2017 CLINICAL DATA:  Right rib pain after fall. EXAM: RIGHT RIBS AND CHEST - 3+ VIEW COMPARISON:  Radiographs of Jun 04, 2016. FINDINGS: Mildly displaced fracture is seen involving lateral portion of right seventh rib. No definite pneumothorax or pleural effusion is noted. Stable cardiomediastinal silhouette. Atherosclerosis of thoracic aorta is noted. No consolidative process is noted. IMPRESSION: Mildly displaced right seventh rib fracture. No acute cardiopulmonary abnormality seen. Electronically Signed   By: Marijo Conception, M.D.   On: 07/28/2017 20:58   Dg Shoulder Right  Result Date: 07/28/2017 CLINICAL DATA:  Right shoulder pain after fall. EXAM: RIGHT SHOULDER - 2+ VIEW COMPARISON:  Radiographs of April 12, 2014. FINDINGS: Status post surgical internal fixation of old healed proximal right humeral neck fracture. Severe degenerative changes seen involving the right glenohumeral joint. No acute fracture or dislocation is noted. IMPRESSION: Old proximal right humeral neck fracture. Severe degenerative joint disease of right glenohumeral joint. No acute abnormality seen in the right shoulder. Electronically Signed   By: Marijo Conception, M.D.   On: 07/28/2017 20:55   Dg Hip Unilat W Or Wo Pelvis 2-3 Views Right  Result Date: 07/28/2017 CLINICAL DATA:  Fall, right hip pain. EXAM: DG HIP (WITH OR WITHOUT PELVIS) 2-3V RIGHT COMPARISON:  None. FINDINGS: Bilateral hip replacements noted. No acute bony abnormality. Specifically, no fracture, subluxation, or dislocation. IMPRESSION:  Bilateral hip replacements.  No acute bony abnormality. Electronically Signed   By: Rolm Baptise M.D.   On: 07/28/2017 20:55    Procedures Procedures (including critical care time)  Medications Ordered in ED Medications  fentaNYL (SUBLIMAZE) injection 50 mcg (50 mcg Intravenous Given 07/28/17 2000)  oxyCODONE-acetaminophen (PERCOCET/ROXICET) 5-325 MG per tablet 1 tablet (1 tablet Oral Given 07/28/17 2202)     Initial Impression / Assessment  and Plan / ED Course  I have reviewed the triage vital signs and the nursing notes.  Pertinent labs & imaging results that were available during my care of the patient were reviewed by me and considered in my medical decision making (see chart for details).     84 year old female with a history of atrial fibrillation on Coumadin, HTN, HLD, right humerus fracture, and arthritis who presents to the emergency department by EMS after a mechanical fall from standing.  She fell onto her right side.  She is tender to palpation to the right ribs, shoulder, and hip.  She did not hit her head or lose consciousness.  Doubt CVA, ICH, or SAH.  The patient was seen and evaluated along with Dr. Francia Greaves, attending physician.  X-ray of the right ribs with a displaced fracture of the lateral aspect of right rib #7.  Imaging of the hip and right shoulder is unremarkable.  Fentanyl was initially given for pain control.  On reevaluation, the patient needed to void, but was having difficulty getting her pain control.  She had attempted to sit upright, but was in too much pain.  Percocet given.  On reevaluation, the patient was feeling much more comfortable.  She was able to ambulate with variable pain.  She successfully voided.  No hematuria.  The patient lives alone, but her daughter has agreed to stay with her for the next few days.  We will discharge the patient to home with Percocet for pain control.  A 55-month prescription history query was performed using the  CSRS prior  to discharge. She has a follow-up appointment with her PCP in 3 days.  Incentive spirometry given.  The patient is already on doxycycline for a sinus infection.  Strict return precautions given.  The patient is hemodynamically stable and in no acute distress.  She is safe for discharge home at this time.  Final Clinical Impressions(s) / ED Diagnoses   Final diagnoses:  Fall, initial encounter  Closed fracture of one rib of right side, initial encounter    ED Discharge Orders        Ordered    oxyCODONE-acetaminophen (PERCOCET/ROXICET) 5-325 MG tablet  Every 6 hours PRN     07/28/17 2316         Danford Tat A, PA-C 07/29/17 8937    Valarie Merino, MD 07/30/17 [phone]

## 2017-07-31 DIAGNOSIS — S2239XA Fracture of one rib, unspecified side, initial encounter for closed fracture: Secondary | ICD-10-CM | POA: Diagnosis not present

## 2017-07-31 DIAGNOSIS — Z7901 Long term (current) use of anticoagulants: Secondary | ICD-10-CM | POA: Diagnosis not present

## 2017-07-31 DIAGNOSIS — I480 Paroxysmal atrial fibrillation: Secondary | ICD-10-CM | POA: Diagnosis not present

## 2017-07-31 DIAGNOSIS — J019 Acute sinusitis, unspecified: Secondary | ICD-10-CM | POA: Diagnosis not present

## 2017-07-31 DIAGNOSIS — S7001XA Contusion of right hip, initial encounter: Secondary | ICD-10-CM | POA: Diagnosis not present

## 2017-07-31 DIAGNOSIS — R05 Cough: Secondary | ICD-10-CM | POA: Diagnosis not present

## 2017-08-06 DIAGNOSIS — Z7901 Long term (current) use of anticoagulants: Secondary | ICD-10-CM | POA: Diagnosis not present

## 2017-08-06 DIAGNOSIS — I10 Essential (primary) hypertension: Secondary | ICD-10-CM | POA: Diagnosis not present

## 2017-08-06 DIAGNOSIS — M545 Low back pain: Secondary | ICD-10-CM | POA: Diagnosis not present

## 2017-08-06 DIAGNOSIS — I480 Paroxysmal atrial fibrillation: Secondary | ICD-10-CM | POA: Diagnosis not present

## 2017-08-06 DIAGNOSIS — R58 Hemorrhage, not elsewhere classified: Secondary | ICD-10-CM | POA: Diagnosis not present

## 2017-08-20 ENCOUNTER — Ambulatory Visit: Payer: Medicare Other

## 2017-08-20 DIAGNOSIS — I480 Paroxysmal atrial fibrillation: Secondary | ICD-10-CM | POA: Diagnosis not present

## 2017-08-20 DIAGNOSIS — R58 Hemorrhage, not elsewhere classified: Secondary | ICD-10-CM | POA: Diagnosis not present

## 2017-08-20 DIAGNOSIS — R0781 Pleurodynia: Secondary | ICD-10-CM | POA: Diagnosis not present

## 2017-08-20 DIAGNOSIS — Z7901 Long term (current) use of anticoagulants: Secondary | ICD-10-CM | POA: Diagnosis not present

## 2017-08-20 DIAGNOSIS — I10 Essential (primary) hypertension: Secondary | ICD-10-CM | POA: Diagnosis not present

## 2017-08-28 DIAGNOSIS — R58 Hemorrhage, not elsewhere classified: Secondary | ICD-10-CM | POA: Diagnosis not present

## 2017-08-28 DIAGNOSIS — I480 Paroxysmal atrial fibrillation: Secondary | ICD-10-CM | POA: Diagnosis not present

## 2017-08-28 DIAGNOSIS — Z6823 Body mass index (BMI) 23.0-23.9, adult: Secondary | ICD-10-CM | POA: Diagnosis not present

## 2017-08-28 DIAGNOSIS — Z7901 Long term (current) use of anticoagulants: Secondary | ICD-10-CM | POA: Diagnosis not present

## 2017-08-28 DIAGNOSIS — I10 Essential (primary) hypertension: Secondary | ICD-10-CM | POA: Diagnosis not present

## 2017-08-28 DIAGNOSIS — R0781 Pleurodynia: Secondary | ICD-10-CM | POA: Diagnosis not present

## 2017-09-16 DIAGNOSIS — I480 Paroxysmal atrial fibrillation: Secondary | ICD-10-CM | POA: Diagnosis not present

## 2017-09-16 DIAGNOSIS — Z23 Encounter for immunization: Secondary | ICD-10-CM | POA: Diagnosis not present

## 2017-09-16 DIAGNOSIS — I10 Essential (primary) hypertension: Secondary | ICD-10-CM | POA: Diagnosis not present

## 2017-09-16 DIAGNOSIS — R58 Hemorrhage, not elsewhere classified: Secondary | ICD-10-CM | POA: Diagnosis not present

## 2017-09-16 DIAGNOSIS — Z6823 Body mass index (BMI) 23.0-23.9, adult: Secondary | ICD-10-CM | POA: Diagnosis not present

## 2017-09-16 DIAGNOSIS — Z7901 Long term (current) use of anticoagulants: Secondary | ICD-10-CM | POA: Diagnosis not present

## 2017-10-09 DIAGNOSIS — M1712 Unilateral primary osteoarthritis, left knee: Secondary | ICD-10-CM | POA: Diagnosis not present

## 2017-10-09 DIAGNOSIS — M25561 Pain in right knee: Secondary | ICD-10-CM | POA: Diagnosis not present

## 2017-10-09 DIAGNOSIS — M25562 Pain in left knee: Secondary | ICD-10-CM | POA: Diagnosis not present

## 2017-10-16 DIAGNOSIS — I10 Essential (primary) hypertension: Secondary | ICD-10-CM | POA: Diagnosis not present

## 2017-10-16 DIAGNOSIS — M25562 Pain in left knee: Secondary | ICD-10-CM | POA: Diagnosis not present

## 2017-10-16 DIAGNOSIS — I480 Paroxysmal atrial fibrillation: Secondary | ICD-10-CM | POA: Diagnosis not present

## 2017-10-16 DIAGNOSIS — R58 Hemorrhage, not elsewhere classified: Secondary | ICD-10-CM | POA: Diagnosis not present

## 2017-10-16 DIAGNOSIS — Z7901 Long term (current) use of anticoagulants: Secondary | ICD-10-CM | POA: Diagnosis not present

## 2017-10-16 DIAGNOSIS — Z6823 Body mass index (BMI) 23.0-23.9, adult: Secondary | ICD-10-CM | POA: Diagnosis not present

## 2017-10-20 ENCOUNTER — Ambulatory Visit
Admission: RE | Admit: 2017-10-20 | Discharge: 2017-10-20 | Disposition: A | Discharge status: Home / Self Care | Payer: Medicare Other | Source: Ambulatory Visit | Attending: Internal Medicine | Admitting: Internal Medicine

## 2017-10-20 DIAGNOSIS — Z1231 Encounter for screening mammogram for malignant neoplasm of breast: Secondary | ICD-10-CM

## 2017-10-23 DIAGNOSIS — M1712 Unilateral primary osteoarthritis, left knee: Secondary | ICD-10-CM | POA: Diagnosis not present

## 2017-10-27 ENCOUNTER — Telehealth: Payer: Self-pay

## 2017-10-27 NOTE — Telephone Encounter (Signed)
    [facility] Pre-operative Risk Assessment    Request for surgical clearance:  1. What type of surgery is being performed? Left total knee  2. When is this surgery scheduled? 12/29/17  3. What type of clearance is required (medical clearance vs. Pharmacy clearance to hold med vs. Both)? Both  4. Are there any medications that need to be held prior to surgery and how long? Coumadin  5. Practice name and name of physician performing surgery? [facility]  Dr.Alusio   6. What is your office phone number [phone] Attn: Glendale Chard   7.   What is your office fax number [phone]  8.   Anesthesia type (None, local, MAC, general) ? Choice    Kathyrn Lass 10/27/2017, 1:17 PM  _________________________________________________________________   (provider comments below)

## 2017-10-29 ENCOUNTER — Telehealth: Payer: Self-pay | Admitting: Cardiology

## 2017-10-29 NOTE — Telephone Encounter (Signed)
Patient with diagnosis of atrial fibrillation on warfarin for anticoagulation.    Procedure: left total knee Date of procedure: 12/29/17  CHADS2-VASc score of  6 (, HTN, AGE,, stroke/tia x 2, CAD, AGE, female   CrCl 55 Platelet count 211  Based on office protocol, patient will need to be bridged with lovenox around procedure.

## 2017-10-29 NOTE — Telephone Encounter (Signed)
Pt scheduled to see Rosaria Ferries, PA on 11/03/17 at 1030 for surgical clearance.

## 2017-10-29 NOTE — Telephone Encounter (Signed)
   Primary Cardiologist:Peter Martinique, MD  Chart reviewed as part of pre-operative protocol coverage. Because of Gloria [name]'s past medical history and time since last visit, he/she will require a follow-up visit in order to better assess preoperative cardiovascular risk.  Pre-op covering staff: - Please schedule appointment and call patient to inform them. - Please contact requesting surgeon's office via preferred method (i.e, phone, fax) to inform them of need for appointment prior to surgery.  Patient was seen by Dr. Martinique in Jan 2019, 6 month followup was recommended. Please move her next followup with Dr. Martinique to early December for clearance.   [place], [place]  10/29/2017, 10:50 AM

## 2017-10-29 NOTE — Telephone Encounter (Signed)
I agree, thanks  Gloria [name] Martinique MD, [facility]

## 2017-10-29 NOTE — Telephone Encounter (Signed)
Appt was moved up to December 13th @ 2:40 pm with Dr Martinique

## 2017-10-29 NOTE — Telephone Encounter (Signed)
New message  Patient is scheduled for a medical clearance appt with Dr. Martinique on 12/29/2017. The patient's surgery is scheduled for 11/24/2017. The patient would like to see if she can be worked into the schedule earlier. The PA's don't have availability before this time either. Please call to discuss.

## 2017-10-29 NOTE — Telephone Encounter (Signed)
   Primary Cardiologist:Peter Martinique, MD  Chart reviewed as part of pre-operative protocol coverage. Because of Gloria [name]'s past medical history and time since last visit, he/she will require a follow-up visit in order to better assess preoperative cardiovascular risk. This has been scheduled for Dec 30 th with Dr Martinique.   Kerin Ransom, PA-C  10/29/2017, 1:57 PM

## 2017-11-03 ENCOUNTER — Encounter: Payer: Self-pay | Admitting: Physician Assistant

## 2017-11-03 ENCOUNTER — Ambulatory Visit (INDEPENDENT_AMBULATORY_CARE_PROVIDER_SITE_OTHER): Payer: Medicare Other | Admitting: Physician Assistant

## 2017-11-03 VITALS — BP 162/72 | HR 55 | Ht 65.0 in | Wt 142.2 lb

## 2017-11-03 DIAGNOSIS — Z01810 Encounter for preprocedural cardiovascular examination: Secondary | ICD-10-CM | POA: Diagnosis not present

## 2017-11-03 DIAGNOSIS — Z7901 Long term (current) use of anticoagulants: Secondary | ICD-10-CM

## 2017-11-03 DIAGNOSIS — I10 Essential (primary) hypertension: Secondary | ICD-10-CM

## 2017-11-03 DIAGNOSIS — I480 Paroxysmal atrial fibrillation: Secondary | ICD-10-CM

## 2017-11-03 NOTE — Progress Notes (Signed)
Cardiology Office Note   Date:  11/03/2017   ID:  Gloria [name], DOB [birth year], MRN [id number]  PCP:  Leanna Battles, MD Cardiologist:  Peter Martinique, MD 01/23/2017 Gloria Ferries, PA-C    History of Present Illness: Gloria [name] is a 84 y.o. female with a history of HTN, HLD, IBS, PAF on flecainide and Coumadin, nl MV 2018, TIA 07/2016  01/2017 office visit, patient in sinus rhythm and blood pressure controlled 07/28/2017 ER visit after a fall, rib fracture noted  [name] presents for cardiology evaluation.   She has fallen at times, says balance is bad. When she has fallen, she has not been using her cane. She has not fallen since she broke her rib.  She needs her L knee replaced.   She is not walking much because of her knee and her balance. She is trying to keep some muscle tone in her legs. She goes to the grocery store and walks around, still drives. She does laundry, but does not sweep or mop.  She does not get CP or SOB w/ exertion.  Her longest walk is to the mailbox, that is about a block and a half.  She does this daily without any difficulty.  She has not had any afib, no palpitations. The Flecainide has really helped her stay in rhythm.   She has not been light-headed or dizzy. She is aware that her HR is low most of the time, it does not bother her or give her symptoms.   Duke Activity Index gives her 3.97 Mets, RCRI is 0.4% risk of major cardiac events.    Past Medical History:  Diagnosis Date  . Arthritis    right  . Atrial fibrillation ([place])   . GERD (gastroesophageal reflux disease)    hx of  . Hepatitis C   . Humerus fracture   . Hyperlipidemia   . Hypertension   . Irritable bowel syndrome   . Meningioma ([place])   . PONV (postoperative nausea and vomiting)     Past Surgical History:  Procedure Laterality Date  . BREAST SURGERY     lumpectomy  . CARDIOVASCULAR STRESS TEST  11/14/2006   EF 67%  . COLONOSCOPY W/ BIOPSIES AND  POLYPECTOMY    . ORIF HUMERUS FRACTURE Right 04/18/2014   Procedure: OPEN REDUCTION INTERNAL FIXATION (ORIF) RIGHT PROXIMAL HUMERUS FRACTURE;  Surgeon: Netta Cedars, MD;  Location: [facility];  Service: Orthopedics;  Laterality: Right;  . ORIF PERIPROSTHETIC FRACTURE Left 04/30/2013   Procedure: OPEN REDUCTION INTERNAL FIXATION (ORIF) PERIPROSTHETIC FRACTURE;  Surgeon: [name], MD;  Location: [facility];  Service: Orthopedics;  Laterality: Left;  . PAROTID GLAND TUMOR EXCISION    . TOTAL HIP ARTHROPLASTY  11/07   left  . TOTAL KNEE ARTHROPLASTY    . TRANSTHORACIC ECHOCARDIOGRAM  10/25/2009   EF 55-60%    Current Outpatient Medications  Medication Sig Dispense Refill  . acetaminophen (TYLENOL) 500 MG tablet Take 1,000 mg by mouth every 6 (six) hours as needed for mild pain.    Marland Kitchen atorvastatin (LIPITOR) 40 MG tablet Take 1 tablet (40 mg total) by mouth daily at 6 PM. 30 tablet 0  . Calcium Carb-Cholecalciferol (CALCIUM 600 + D PO) Take 1 tablet by mouth 2 (two) times daily.    . flecainide (TAMBOCOR) 50 MG tablet Take 1 tablet (50 mg total) by mouth 2 (two) times daily. 180 tablet 1  . hydrochlorothiazide (HYDRODIURIL) 25 MG tablet TAKE 1 TABLET BY  MOUTH ONCE DAILY 90 tablet 3  . losartan (COZAAR) 25 MG tablet TAKE 1 TABLET BY MOUTH ONCE DAILY 90 tablet 1  . Multiple Vitamins-Minerals (ICAPS MV PO) Take 1 capsule by mouth 2 (two) times daily.     . nitroGLYCERIN (NITROSTAT) 0.4 MG SL tablet Place 1 tablet (0.4 mg total) under the tongue every 5 (five) minutes as needed for chest pain. 30 tablet 0  . omeprazole (PRILOSEC) 20 MG capsule Take 20 mg by mouth 2 (two) times daily.     Gloria [name] Glycol-Propyl Glycol (SYSTANE ULTRA OP) Apply 1 drop to eye daily as needed (dry eyes).     . vitamin B-12 (CYANOCOBALAMIN) 1000 MCG tablet Take 1,000 mcg by mouth daily.    Marland Kitchen warfarin (COUMADIN) 6 MG tablet Take 3-6 tablets by mouth daily. Take 3 mg on Friday all other days take 6 mg     No current  facility-administered medications for this visit.     Allergies:   Amlodipine; Cephalexin; Codeine; Metoclopramide hcl; and Penicillins    Social History:  The patient  reports that she quit smoking about 37 years ago. Her smoking use included cigarettes. She has a 4.50 pack-year smoking history. She has never used smokeless tobacco. She reports that she does not drink alcohol or use drugs.   Family History:  The patient's family history includes Heart disease in her brother.  She indicated that her mother is deceased. She indicated that her father is deceased. She indicated that her brother is alive. She indicated that her maternal grandmother is deceased. She indicated that her maternal grandfather is deceased. She indicated that her paternal grandmother is deceased. She indicated that her paternal grandfather is deceased.    ROS:  Please see the history of present illness. All other systems are reviewed and negative.    PHYSICAL EXAM: VS:  BP (!) 162/72   Pulse (!) 55   Ht 5\' 5"  (1.651 m)   Wt 142 lb 3.2 oz (64.5 kg)   BMI 23.66 kg/m  , BMI Body mass index is 23.66 kg/m. GEN: Well nourished, well developed, female in no acute distress HEENT: normal for age  Neck: no JVD, no carotid bruit, no masses Cardiac: RRR; no murmur, no rubs, or gallops Respiratory:  clear to auscultation bilaterally, normal work of breathing GI: soft, nontender, nondistended, + BS MS: no deformity or atrophy; no edema; distal pulses are 2+ in all 4 extremities  Skin: warm and dry, no rash Neuro:  Strength and sensation are intact Psych: euthymic mood, full affect   EKG:  EKG is ordered today. The ekg ordered today demonstrates Sinus brady  Myoview 06/05/16: Study Result    The study is normal.  This is a low risk study.  The left ventricular ejection fraction is hyperdynamic (>65%).  There was no ST segment deviation noted during stress.  Normal resting and stress perfusion. No ischemia  or infarction EF 72%    Echo 06/05/16: Study Conclusions - Left ventricle: The cavity size was normal. Wall thickness was increased in a pattern of mild LVH. There was moderate concentric hypertrophy. Systolic function was normal. The estimated ejection fraction was in the range of 55% to 60%. - Left atrium: The atrium was moderately dilated. - Atrial septum: No defect or patent foramen ovale was identified.   Recent Labs: No results found for requested labs within last 8760 hours.  CBC    Component Value Date/Time   WBC 6.7 07/14/2016 0230   RBC  4.62 07/14/2016 0230   HGB 14.3 07/14/2016 0238   HCT 42.0 07/14/2016 0238   PLT 211 07/14/2016 0230   MCV 91.1 07/14/2016 0230   MCH 29.9 07/14/2016 0230   MCHC 32.8 07/14/2016 0230   RDW 14.2 07/14/2016 0230   LYMPHSABS 2.2 07/14/2016 0230   MONOABS 0.7 07/14/2016 0230   EOSABS 0.2 07/14/2016 0230   BASOSABS 0.0 07/14/2016 0230   CMP Latest Ref Rng & Units 07/16/2016 07/14/2016 07/14/2016  Glucose 65 - 99 mg/dL 174(H) 105(H) 110(H)  BUN 8 - 27 mg/dL 21 12 11   Creatinine 0.57 - 1.00 mg/dL 0.83 0.70 0.80  Sodium 134 - 144 mmol/L 144 139 137  Potassium 3.5 - 5.2 mmol/L 3.8 3.5 3.5  Chloride 96 - 106 mmol/L 102 101 102  CO2 20 - 29 mmol/L 23 - 25  Calcium 8.7 - 10.3 mg/dL 9.9 - 9.4  Total Protein 6.5 - 8.1 g/dL - - 6.3(L)  Total Bilirubin 0.3 - 1.2 mg/dL - - 0.9  Alkaline Phos 38 - 126 U/L - - 92  AST 15 - 41 U/L - - 25  ALT 14 - 54 U/L - - 17     Lipid Panel    Component Value Date/Time   CHOL 178 07/14/2016 0536   TRIG 69 07/14/2016 0536   HDL 58 07/14/2016 0536   CHOLHDL 3.1 07/14/2016 0536   VLDL 14 07/14/2016 0536   LDLCALC 106 (H) 07/14/2016 0536     Wt Readings from Last 3 Encounters:  11/03/17 142 lb 3.2 oz (64.5 kg)  07/28/17 148 lb (67.1 kg)  01/23/17 149 lb 8 oz (67.8 kg)     Other studies Reviewed: Additional studies/ records that were reviewed today include: office notes, hospital records and  testing.  ASSESSMENT AND PLAN:  1.  Preop cardiovascular exam: Her activity is limited by musculoskeletal issues, not by chest pain or shortness of breath. -Stress testing and echocardiogram in the past have not shown any significant abnormalities. -She is bradycardic at baseline, not on any rate lowering medications, also without symptoms -RCRI is 0.4% risk of major cardiovascular events in the perioperative period for score of 0 - Duke activity index is low at 3.97 METS, but acceptable -She is at acceptable risk for the planned procedure without any additional cardiovascular work-up.  2.  Chronic anticoagulation: - CHA2DS2-VASc = 6 (age x 2, TIA x 2, HTN, female) - Dr. Peri Maris office asked if it was okay to hold the Coumadin for the procedure. -This was reviewed by our Pharm.D. - Her risk is elevated enough that she should be bridged with Lovenox while she is off the Coumadin per our office protocol. -Family was given this information and they will communicate it to Dr. Sharlett Iles who manages her Coumadin.  3.  PAF: She has had no symptoms, continue flecainide and Coumadin.  She is to make sure that she does not miss any doses of flecainide before the surgery or after.  4.  Hypertension: Her blood pressure is elevated today, but she has some anxiety about the procedure.  Per the patient, her blood pressure does not normally run this time.  I will hold off on making medication adjustments at this time.  Current medicines are reviewed at length with the patient today.  The patient has concerns regarding medicines.  The following changes have been made:  no change  Labs/ tests ordered today include:  No orders of the defined types were placed in this encounter.  Disposition:   FU with Peter Martinique, MD  Signed, Gloria Ferries, PA-C  11/03/2017 10:58 AM    [place] Phone: [phone]; Fax: [phone]  This note was written with the assistance of  speech recognition software.  Please excuse any transcriptional errors.

## 2017-11-03 NOTE — Patient Instructions (Addendum)
Medication Instructions:  No Changes. If you need a refill on your cardiac medications before your next appointment, please call your pharmacy.   Lab work: None Ordered. If you have labs (blood work) drawn today and your tests are completely normal, you will receive your results only by: Marland Kitchen MyChart Message (if you have MyChart) OR . A paper copy in the mail If you have any lab test that is abnormal or we need to change your treatment, we will call you to review the results.  Testing/Procedures: None Ordered.  Follow-Up: At [facility], you and your health needs are our priority.  As part of our continuing mission to provide you with exceptional heart care, we have created designated Provider Care Teams.  These Care Teams include your primary Cardiologist (physician) and Advanced Practice Providers (APPs -  Physician Assistants and Nurse Practitioners) who all work together to provide you with the care you need, when you need it. You will need a follow up appointment in 3 months. You may see Peter Martinique, MD or one of the following Advanced Practice Providers on your designated Care Team: [place], [place] . Fabian Sharp, PA-C  Any Other Special Instructions Will Be Listed Below (If Applicable). Please give information below to Dr.Patterson, if he would like for [place] to do the Lovenox bridging please call us.  See note below regarding medications: Per PharmD review; Patient with diagnosis of atrial fibrillation on warfarin for anticoagulation.    Procedure: left total knee Date of procedure: 11/2017  CHADS2-VASc score of  6 (, HTN, AGE,, stroke/tia x 2, CAD, female   CrCl 55 Platelet count 211  Based on office protocol, patient will need to be bridged with lovenox around procedure.

## 2017-11-10 DIAGNOSIS — M25562 Pain in left knee: Secondary | ICD-10-CM | POA: Diagnosis not present

## 2017-11-11 NOTE — H&P (Signed)
TOTAL KNEE ADMISSION H&P  Patient is being admitted for left total knee arthroplasty.  Subjective:  Chief Complaint:left knee pain.  HPI: Gloria [name], 84 y.o. female, has a history of pain and functional disability in the left knee due to arthritis and has failed non-surgical conservative treatments for greater than 12 weeks to includeuse of assistive devices and activity modification.  Onset of symptoms was gradual, starting several years ago with gradually worsening course since that time. The patient noted prior procedures on the knee to include  arthroscopy on the left knee(s).  Patient currently rates pain in the left knee(s) at 10 out of 10 with activity. Patient has worsening of pain with activity and weight bearing, joint swelling and instability.  Patient has evidence of bone-on-bone osteoarthritis of the lateral and patellofemoral compartments with an approximate 20-degree valgus deformity by imaging studies. There is no active infection.  Patient Active Problem List   Diagnosis Date Noted  . TIA (transient ischemic attack) 07/14/2016  . Nonintractable headache   . Warfarin anticoagulation 04/09/2011    Class: Chronic  . Hypertension   . Irritable bowel syndrome   . Atrial fibrillation [facility])    Past Medical History:  Diagnosis Date  . Arthritis    right  . Atrial fibrillation ([place])   . GERD (gastroesophageal reflux disease)    hx of  . Hepatitis C   . Humerus fracture   . Hyperlipidemia   . Hypertension   . Irritable bowel syndrome   . Meningioma ([place])   . PONV (postoperative nausea and vomiting)     Past Surgical History:  Procedure Laterality Date  . BREAST SURGERY     lumpectomy  . CARDIOVASCULAR STRESS TEST  11/14/2006   EF 67%  . COLONOSCOPY W/ BIOPSIES AND POLYPECTOMY    . ORIF HUMERUS FRACTURE Right 04/18/2014   Procedure: OPEN REDUCTION INTERNAL FIXATION (ORIF) RIGHT PROXIMAL HUMERUS FRACTURE;  Surgeon: Netta Cedars, MD;  Location: [facility];  Service:  Orthopedics;  Laterality: Right;  . ORIF PERIPROSTHETIC FRACTURE Left 04/30/2013   Procedure: OPEN REDUCTION INTERNAL FIXATION (ORIF) PERIPROSTHETIC FRACTURE;  Surgeon: [name], MD;  Location: [facility];  Service: Orthopedics;  Laterality: Left;  . PAROTID GLAND TUMOR EXCISION    . TOTAL HIP ARTHROPLASTY  11/07   left  . TOTAL KNEE ARTHROPLASTY    . TRANSTHORACIC ECHOCARDIOGRAM  10/25/2009   EF 55-60%    No current facility-administered medications for this encounter.    Current Outpatient Medications  Medication Sig Dispense Refill Last Dose  . acetaminophen (TYLENOL) 500 MG tablet Take 1,000 mg by mouth every 6 (six) hours as needed for mild pain.   Taking  . atorvastatin (LIPITOR) 40 MG tablet Take 1 tablet (40 mg total) by mouth daily at 6 PM. 30 tablet 0 Taking  . Calcium Carb-Cholecalciferol (CALCIUM 600 + D PO) Take 1 tablet by mouth 2 (two) times daily.   Taking  . flecainide (TAMBOCOR) 50 MG tablet Take 1 tablet (50 mg total) by mouth 2 (two) times daily. 180 tablet 1 Taking  . hydrochlorothiazide (HYDRODIURIL) 25 MG tablet TAKE 1 TABLET BY MOUTH ONCE DAILY 90 tablet 3 Taking  . losartan (COZAAR) 25 MG tablet TAKE 1 TABLET BY MOUTH ONCE DAILY 90 tablet 1 Taking  . Multiple Vitamins-Minerals (ICAPS MV PO) Take 1 capsule by mouth 2 (two) times daily.    Taking  . nitroGLYCERIN (NITROSTAT) 0.4 MG SL tablet Place 1 tablet (0.4 mg total) under the tongue every  5 (five) minutes as needed for chest pain. 30 tablet 0 Taking  . omeprazole (PRILOSEC) 20 MG capsule Take 20 mg by mouth 2 (two) times daily.    Taking  . Polyethyl Glycol-Propyl Glycol (SYSTANE ULTRA OP) Apply 1 drop to eye daily as needed (dry eyes).    Taking  . vitamin B-12 (CYANOCOBALAMIN) 1000 MCG tablet Take 1,000 mcg by mouth daily.   Taking  . warfarin (COUMADIN) 6 MG tablet Take 3-6 tablets by mouth daily. Take 3 mg on Friday all other days take 6 mg   Taking   Allergies  Allergen Reactions  . Amlodipine Swelling    . Cephalexin Other (See Comments)    unknown  . Codeine Other (See Comments)    unknown  . Metoclopramide Hcl Other (See Comments)    unknown  . Penicillins Other (See Comments)    Told my MD that she was allergic     Social History   Tobacco Use  . Smoking status: Former Smoker    Packs/day: 0.30    Years: 15.00    Pack years: 4.50    Types: Cigarettes    Last attempt to quit: 06/07/1980    Years since quitting: 37.4  . Smokeless tobacco: Never Used  Substance Use Topics  . Alcohol use: No    Family History  Problem Relation Age of Onset  . Heart disease Brother      Review of Systems  Constitutional: Negative for chills and fever.  HENT: Negative for congestion, sore throat and tinnitus.   Eyes: Negative for double vision, photophobia and pain.  Respiratory: Negative for cough, shortness of breath and wheezing.   Cardiovascular: Negative for chest pain, palpitations and orthopnea.  Gastrointestinal: Negative for heartburn, nausea and vomiting.  Genitourinary: Negative for dysuria, frequency and urgency.  Musculoskeletal: Positive for joint pain.  Neurological: Negative for dizziness, weakness and headaches.    Objective:  Physical Exam  Well nourished and well developed. General: Alert and oriented x3, cooperative and pleasant, no acute distress. Head: normocephalic, atraumatic, neck supple. Eyes: EOMI. Respiratory: breath sounds clear in all fields, no wheezing, rales, or rhonchi. Cardiovascular: Regular rate and rhythm, no murmurs, gallops or rubs.  Abdomen: non-tender to palpation and soft, normoactive bowel sounds. Musculoskeletal: Significant antalgic gait on the left with a significant collapse into valgus when weight-bearing. She is walking with the assistance of a cane. Right Knee Exam: No effusion. No Swelling.Range of motion is 0-125 degrees. No tenderness.No instability. Excellent alignment. Left Knee Exam: Severe valgus deformity. No effusion. No  Swelling.Range of motion is 0-125 degrees. No crepitus on range of motion of the knee. Moderate medial joint line tenderness. Significant lateral joint line tenderness. No gross instability. Psuedo-laxity when corrected into a neutral position. Calves soft and nontender. Motor function intact in LE. Strength 5/5 LE bilaterally. Neuro: Distal pulses 2+. Sensation to light touch intact in LE.  Vital signs in last 24 hours:  Blood pressure: 144/82 mmHg Pulse: 52 bpm  Labs:   Estimated body mass index is 23.66 kg/m as calculated from the following:   Height as of 11/03/17: 5\' 5"  (1.651 m).   Weight as of 11/03/17: 64.5 kg.   Imaging Review Plain radiographs demonstrate severe degenerative joint disease of the left knee(s). The overall alignment issignificant valgus. The bone quality appears to be adequate for age and reported activity level.   Preoperative templating of the joint replacement has been completed, documented, and submitted to the Operating Room personnel in order  to optimize intra-operative equipment management.   Anticipated LOS equal to or greater than 2 midnights due to - Age 35 and older with one or more of the following:  - Obesity  - Expected need for hospital services (PT, OT, Nursing) required for safe  discharge  - Anticipated need for postoperative skilled nursing care or inpatient rehab  - Active co-morbidities: Cardiac Arrhythmia OR   - Unanticipated findings during/Post Surgery: None  - Patient is a high risk of re-admission due to: None     Assessment/Plan:  End stage arthritis, left knee   The patient history, physical examination, clinical judgment of the provider and imaging studies are consistent with end stage degenerative joint disease of the left knee(s) and total knee arthroplasty is deemed medically necessary. The treatment options including medical management, injection therapy arthroscopy and arthroplasty were discussed at length. The risks  and benefits of total knee arthroplasty were presented and reviewed. The risks due to aseptic loosening, infection, stiffness, patella tracking problems, thromboembolic complications and other imponderables were discussed. The patient acknowledged the explanation, agreed to proceed with the plan and consent was signed. Patient is being admitted for inpatient treatment for surgery, pain control, PT, OT, prophylactic antibiotics, VTE prophylaxis, progressive ambulation and ADL's and discharge planning. The patient is planning to be discharged home with outpatient physical therapy.   Therapy Plans: outpatient therapy at [facility] on [facility] Disposition: Home with daughter Planned DVT Prophylaxis: Coumadin (Pt has atrial fibrillation) DME needed: None PCP: Bevelyn Buckles, MD Cardiologist: Peter Martinique, MD TXA: IV Allergies: Cephalexin, codeine, reglan (unsure of reactions) Anesthesia Concerns: None Other: Patient will bridge warfarin with lovenox bridge, which will be prescribed by Dr. Sharlett Iles  - Patient was instructed on what medications to stop prior to surgery. - Follow-up visit in 2 weeks with Dr. Wynelle Link - Begin physical therapy following surgery - Pre-operative lab work as pre-surgical testing - Prescriptions will be provided in hospital at time of discharge  Theresa Duty, PA-C Orthopedic Surgery [facility] [place]

## 2017-11-13 DIAGNOSIS — R0789 Other chest pain: Secondary | ICD-10-CM | POA: Diagnosis not present

## 2017-11-13 DIAGNOSIS — K219 Gastro-esophageal reflux disease without esophagitis: Secondary | ICD-10-CM | POA: Diagnosis not present

## 2017-11-13 DIAGNOSIS — G459 Transient cerebral ischemic attack, unspecified: Secondary | ICD-10-CM | POA: Diagnosis not present

## 2017-11-13 DIAGNOSIS — E7849 Other hyperlipidemia: Secondary | ICD-10-CM | POA: Diagnosis not present

## 2017-11-13 DIAGNOSIS — M25562 Pain in left knee: Secondary | ICD-10-CM | POA: Diagnosis not present

## 2017-11-13 DIAGNOSIS — M859 Disorder of bone density and structure, unspecified: Secondary | ICD-10-CM | POA: Diagnosis not present

## 2017-11-13 DIAGNOSIS — Z7901 Long term (current) use of anticoagulants: Secondary | ICD-10-CM | POA: Diagnosis not present

## 2017-11-13 DIAGNOSIS — Z6823 Body mass index (BMI) 23.0-23.9, adult: Secondary | ICD-10-CM | POA: Diagnosis not present

## 2017-11-13 DIAGNOSIS — I10 Essential (primary) hypertension: Secondary | ICD-10-CM | POA: Diagnosis not present

## 2017-11-13 DIAGNOSIS — I480 Paroxysmal atrial fibrillation: Secondary | ICD-10-CM | POA: Diagnosis not present

## 2017-11-14 NOTE — Progress Notes (Signed)
11-03-17 (Epic) Cardiac Clearance Rosaria Ferries, PAC and EKG  07-28-17  (Epic) CXR  06-05-16 (Epic) ECHO

## 2017-11-14 NOTE — Patient Instructions (Addendum)
Gloria [name]  11/14/2017   Your procedure is scheduled on: 11-24-17     Report to [facility] Main  Entrance    Report to Admitting at 10:10 AM    Call this number if you have problems the morning of surgery [phone]     Remember: Do not eat food or drink liquids :After Midnight.    Take these medicines the morning of surgery with A SIP OF WATER: Flecainide (Tamabocor), and Omeprazole (Prilosec)          BRUSH YOUR TEETH MORNING OF SURGERY AND RINSE YOUR MOUTH OUT, NO CHEWING GUM CANDY OR MINTS.                         You may not have any metal on your body including hair pins and              piercings  Do not wear jewelry, make-up, lotions, powders or perfumes, deodorant             Do not wear nail polish.  Do not shave  48 hours prior to surgery.               Do not bring valuables to the hospital. [place].  Contacts, dentures or bridgework may not be worn into surgery.  Leave suitcase in the car. After surgery it may be brought to your room.     Special Instructions: N/A              Please read over the following fact sheets you were given: _____________________________________________________________________          [facility] - Preparing for Surgery Before surgery, you can play an important role.  Because skin is not sterile, your skin needs to be as free of germs as possible.  You can reduce the number of germs on your skin by washing with CHG (chlorahexidine gluconate) soap before surgery.  CHG is an antiseptic cleaner which kills germs and bonds with the skin to continue killing germs even after washing. Please DO NOT use if you have an allergy to CHG or antibacterial soaps.  If your skin becomes reddened/irritated stop using the CHG and inform your nurse when you arrive at Short Stay. Do not shave (including legs and underarms) for at least 48 hours prior to the first CHG shower.   You may shave your face/neck. Please follow these instructions carefully:  1.  Shower with CHG Soap the night before surgery and the  morning of Surgery.  2.  If you choose to wash your hair, wash your hair first as usual with your  normal  shampoo.  3.  After you shampoo, rinse your hair and body thoroughly to remove the  shampoo.                           4.  Use CHG as you would any other liquid soap.  You can apply chg directly  to the skin and wash                       Gently with a scrungie or clean washcloth.  5.  Apply the CHG Soap to your body  ONLY FROM THE NECK DOWN.   Do not use on face/ open                           Wound or open sores. Avoid contact with eyes, ears mouth and genitals (private parts).                       Wash face,  Genitals (private parts) with your normal soap.             6.  Wash thoroughly, paying special attention to the area where your surgery  will be performed.  7.  Thoroughly rinse your body with warm water from the neck down.  8.  DO NOT shower/wash with your normal soap after using and rinsing off  the CHG Soap.                9.  Pat yourself dry with a clean towel.            10.  Wear clean pajamas.            11.  Place clean sheets on your bed the night of your first shower and do not  sleep with pets. Day of Surgery : Do not apply any lotions/deodorants the morning of surgery.  Please wear clean clothes to the hospital/surgery center.  FAILURE TO FOLLOW THESE INSTRUCTIONS MAY RESULT IN THE CANCELLATION OF YOUR SURGERY PATIENT SIGNATURE_________________________________  NURSE SIGNATURE__________________________________  ________________________________________________________________________   Adam Phenix  An incentive spirometer is a tool that can help keep your lungs clear and active. This tool measures how well you are filling your lungs with each breath. Taking long deep breaths may help reverse or decrease the chance of  developing breathing (pulmonary) problems (especially infection) following:  A long period of time when you are unable to move or be active. BEFORE THE PROCEDURE   If the spirometer includes an indicator to show your best effort, your nurse or respiratory therapist will set it to a desired goal.  If possible, sit up straight or lean slightly forward. Try not to slouch.  Hold the incentive spirometer in an upright position. INSTRUCTIONS FOR USE  1. Sit on the edge of your bed if possible, or sit up as far as you can in bed or on a chair. 2. Hold the incentive spirometer in an upright position. 3. Breathe out normally. 4. Place the mouthpiece in your mouth and seal your lips tightly around it. 5. Breathe in slowly and as deeply as possible, raising the piston or the ball toward the top of the column. 6. Hold your breath for 3-5 seconds or for as long as possible. Allow the piston or ball to fall to the bottom of the column. 7. Remove the mouthpiece from your mouth and breathe out normally. 8. Rest for a few seconds and repeat Steps 1 through 7 at least 10 times every 1-2 hours when you are awake. Take your time and take a few normal breaths between deep breaths. 9. The spirometer may include an indicator to show your best effort. Use the indicator as a goal to work toward during each repetition. 10. After each set of 10 deep breaths, practice coughing to be sure your lungs are clear. If you have an incision (the cut made at the time of surgery), support your incision when coughing by placing a pillow or rolled up towels firmly against it. Once  you are able to get out of bed, walk around indoors and cough well. You may stop using the incentive spirometer when instructed by your caregiver.  RISKS AND COMPLICATIONS  Take your time so you do not get dizzy or light-headed.  If you are in pain, you may need to take or ask for pain medication before doing incentive spirometry. It is harder to take a  deep breath if you are having pain. AFTER USE  Rest and breathe slowly and easily.  It can be helpful to keep track of a log of your progress. Your caregiver can provide you with a simple table to help with this. If you are using the spirometer at home, follow these instructions: [place] IF:   You are having difficultly using the spirometer.  You have trouble using the spirometer as often as instructed.  Your pain medication is not giving enough relief while using the spirometer.  You develop fever of 100.5 F (38.1 C) or higher. SEEK IMMEDIATE MEDICAL CARE IF:   You cough up bloody sputum that had not been present before.  You develop fever of 102 F (38.9 C) or greater.  You develop worsening pain at or near the incision site. MAKE SURE YOU:   Understand these instructions.  Will watch your condition.  Will get help right away if you are not doing well or get worse. Document Released: 05/06/2006 Document Revised: 03/18/2011 Document Reviewed: 07/07/2006 Exit[employer] Patient Information 2014 Exit[employer], [place].   ________________________________________________________________________  WHAT IS A BLOOD TRANSFUSION? Blood Transfusion Information  A transfusion is the replacement of blood or some of its parts. Blood is made up of multiple cells which provide different functions.  Red blood cells carry oxygen and are used for blood loss replacement.  White blood cells fight against infection.  Platelets control bleeding.  Plasma helps clot blood.  Other blood products are available for specialized needs, such as hemophilia or other clotting disorders. BEFORE THE TRANSFUSION  Who gives blood for transfusions?   Healthy volunteers who are fully evaluated to make sure their blood is safe. This is blood bank blood. Transfusion therapy is the safest it has ever been in the practice of medicine. Before blood is taken from a donor, a complete history is taken to make  sure that person has no history of diseases nor engages in risky social behavior (examples are intravenous drug use or sexual activity with multiple partners). The donor's travel history is screened to minimize risk of transmitting infections, such as malaria. The donated blood is tested for signs of infectious diseases, such as HIV and hepatitis. The blood is then tested to be sure it is compatible with you in order to minimize the chance of a transfusion reaction. If you or a relative donates blood, this is often done in anticipation of surgery and is not appropriate for emergency situations. It takes many days to process the donated blood. RISKS AND COMPLICATIONS Although transfusion therapy is very safe and saves many lives, the main dangers of transfusion include:   Getting an infectious disease.  Developing a transfusion reaction. This is an allergic reaction to something in the blood you were given. Every precaution is taken to prevent this. The decision to have a blood transfusion has been considered carefully by your caregiver before blood is given. Blood is not given unless the benefits outweigh the risks. AFTER THE TRANSFUSION  Right after receiving a blood transfusion, you will usually feel much better and more energetic. This  is especially true if your red blood cells have gotten low (anemic). The transfusion raises the level of the red blood cells which carry oxygen, and this usually causes an energy increase.  The nurse administering the transfusion will monitor you carefully for complications. HOME CARE INSTRUCTIONS  No special instructions are needed after a transfusion. You may find your energy is better. Speak with your caregiver about any limitations on activity for underlying diseases you may have. SEEK MEDICAL CARE IF:   Your condition is not improving after your transfusion.  You develop redness or irritation at the intravenous (IV) site. SEEK IMMEDIATE MEDICAL CARE IF:   Any of the following symptoms occur over the next 12 hours:  Shaking chills.  You have a temperature by mouth above 102 F (38.9 C), not controlled by medicine.  Chest, back, or muscle pain.  People around you feel you are not acting correctly or are confused.  Shortness of breath or difficulty breathing.  Dizziness and fainting.  You get a rash or develop hives.  You have a decrease in urine output.  Your urine turns a dark color or changes to pink, red, or brown. Any of the following symptoms occur over the next 10 days:  You have a temperature by mouth above 102 F (38.9 C), not controlled by medicine.  Shortness of breath.  Weakness after normal activity.  The white part of the eye turns yellow (jaundice).  You have a decrease in the amount of urine or are urinating less often.  Your urine turns a dark color or changes to pink, red, or brown. Document Released: 12/22/1999 Document Revised: 03/18/2011 Document Reviewed: 08/10/2007 [facility] Patient Information 2014 [place], [place].  _______________________________________________________________________

## 2017-11-17 ENCOUNTER — Other Ambulatory Visit: Payer: Self-pay

## 2017-11-17 ENCOUNTER — Encounter (HOSPITAL_COMMUNITY)
Admission: RE | Admit: 2017-11-17 | Discharge: 2017-11-17 | Disposition: A | Discharge status: Home / Self Care | Payer: Medicare Other | Source: Ambulatory Visit | Attending: Orthopedic Surgery | Admitting: Orthopedic Surgery

## 2017-11-17 ENCOUNTER — Encounter (HOSPITAL_COMMUNITY): Payer: Self-pay

## 2017-11-17 DIAGNOSIS — Z01818 Encounter for other preprocedural examination: Secondary | ICD-10-CM | POA: Diagnosis not present

## 2017-11-17 DIAGNOSIS — M1712 Unilateral primary osteoarthritis, left knee: Secondary | ICD-10-CM | POA: Diagnosis not present

## 2017-11-17 LAB — SURGICAL PCR SCREEN
MRSA, PCR: NEGATIVE
Staphylococcus aureus: NEGATIVE

## 2017-11-17 LAB — COMPREHENSIVE METABOLIC PANEL
ALT: 16 U/L (ref 0–44)
AST: 22 U/L (ref 15–41)
Albumin: 4.5 g/dL (ref 3.5–5.0)
Alkaline Phosphatase: 148 U/L — ABNORMAL HIGH (ref 38–126)
Anion gap: 10 (ref 5–15)
BUN: 16 mg/dL (ref 8–23)
CO2: 27 mmol/L (ref 22–32)
Calcium: 9.6 mg/dL (ref 8.9–10.3)
Chloride: 103 mmol/L (ref 98–111)
Creatinine, Ser: 0.79 mg/dL (ref 0.44–1.00)
GFR calc Af Amer: >60 mL/min (ref >60)
GFR calc non Af Amer: >60 mL/min (ref >60)
Glucose, Bld: 100 mg/dL — ABNORMAL HIGH (ref 70–99)
Potassium: 3.7 mmol/L (ref 3.5–5.1)
Sodium: 140 mmol/L (ref 135–145)
Total Bilirubin: 1.1 mg/dL (ref 0.3–1.2)
Total Protein: 7.2 g/dL (ref 6.5–8.1)

## 2017-11-17 LAB — CBC
HCT: 45.4 % (ref 36.0–46.0)
Hemoglobin: 14.1 g/dL (ref 12.0–15.0)
MCH: 29.1 pg (ref 26.0–34.0)
MCHC: 31.1 g/dL (ref 30.0–36.0)
MCV: 93.6 fL (ref 80.0–100.0)
Platelets: 236 10*3/uL (ref 150–400)
RBC: 4.85 MIL/uL (ref 3.87–5.11)
RDW: 13.3 % (ref 11.5–15.5)
WBC: 6 10*3/uL (ref 4.0–10.5)
nRBC: 0 % (ref 0.0–0.2)

## 2017-11-17 LAB — APTT: aPTT: 32 seconds (ref 24–36)

## 2017-11-17 LAB — PROTIME-INR
INR: 1.89
Prothrombin Time: 21.5 seconds — ABNORMAL HIGH (ref 11.4–15.2)

## 2017-11-17 NOTE — Progress Notes (Signed)
11-17-17 PT result routed to Dr. Anne Fu office for review

## 2017-11-19 DIAGNOSIS — Z6823 Body mass index (BMI) 23.0-23.9, adult: Secondary | ICD-10-CM | POA: Diagnosis not present

## 2017-11-19 DIAGNOSIS — I480 Paroxysmal atrial fibrillation: Secondary | ICD-10-CM | POA: Diagnosis not present

## 2017-11-19 DIAGNOSIS — Z7902 Long term (current) use of antithrombotics/antiplatelets: Secondary | ICD-10-CM | POA: Diagnosis not present

## 2017-11-24 ENCOUNTER — Inpatient Hospital Stay (HOSPITAL_COMMUNITY): Payer: Medicare Other | Admitting: Certified Registered Nurse Anesthetist

## 2017-11-24 ENCOUNTER — Other Ambulatory Visit: Payer: Self-pay

## 2017-11-24 ENCOUNTER — Encounter (HOSPITAL_COMMUNITY): Payer: Self-pay | Admitting: *Deleted

## 2017-11-24 ENCOUNTER — Other Ambulatory Visit: Payer: Self-pay | Admitting: Orthopedic Surgery

## 2017-11-24 ENCOUNTER — Inpatient Hospital Stay (HOSPITAL_COMMUNITY)
Admission: RE | Admit: 2017-11-24 | Discharge: 2017-11-26 | DRG: 470 | Disposition: A | Discharge status: Home / Self Care | Payer: Medicare Other | Attending: Orthopedic Surgery | Admitting: Orthopedic Surgery

## 2017-11-24 ENCOUNTER — Encounter (HOSPITAL_COMMUNITY)
Admission: RE | Disposition: A | Discharge status: Home / Self Care | Payer: Self-pay | Source: Home / Self Care | Attending: Orthopedic Surgery

## 2017-11-24 DIAGNOSIS — Z8249 Family history of ischemic heart disease and other diseases of the circulatory system: Secondary | ICD-10-CM

## 2017-11-24 DIAGNOSIS — K219 Gastro-esophageal reflux disease without esophagitis: Secondary | ICD-10-CM | POA: Diagnosis present

## 2017-11-24 DIAGNOSIS — Z96642 Presence of left artificial hip joint: Secondary | ICD-10-CM | POA: Diagnosis present

## 2017-11-24 DIAGNOSIS — B1920 Unspecified viral hepatitis C without hepatic coma: Secondary | ICD-10-CM | POA: Diagnosis present

## 2017-11-24 DIAGNOSIS — Z86011 Personal history of benign neoplasm of the brain: Secondary | ICD-10-CM | POA: Diagnosis not present

## 2017-11-24 DIAGNOSIS — Z79899 Other long term (current) drug therapy: Secondary | ICD-10-CM | POA: Diagnosis not present

## 2017-11-24 DIAGNOSIS — Z888 Allergy status to other drugs, medicaments and biological substances status: Secondary | ICD-10-CM | POA: Diagnosis not present

## 2017-11-24 DIAGNOSIS — E785 Hyperlipidemia, unspecified: Secondary | ICD-10-CM | POA: Diagnosis present

## 2017-11-24 DIAGNOSIS — Z8673 Personal history of transient ischemic attack (TIA), and cerebral infarction without residual deficits: Secondary | ICD-10-CM | POA: Diagnosis not present

## 2017-11-24 DIAGNOSIS — I4891 Unspecified atrial fibrillation: Secondary | ICD-10-CM | POA: Diagnosis present

## 2017-11-24 DIAGNOSIS — I10 Essential (primary) hypertension: Secondary | ICD-10-CM | POA: Diagnosis present

## 2017-11-24 DIAGNOSIS — M25762 Osteophyte, left knee: Secondary | ICD-10-CM | POA: Diagnosis present

## 2017-11-24 DIAGNOSIS — K589 Irritable bowel syndrome without diarrhea: Secondary | ICD-10-CM | POA: Diagnosis present

## 2017-11-24 DIAGNOSIS — G8918 Other acute postprocedural pain: Secondary | ICD-10-CM | POA: Diagnosis not present

## 2017-11-24 DIAGNOSIS — M1712 Unilateral primary osteoarthritis, left knee: Secondary | ICD-10-CM | POA: Diagnosis present

## 2017-11-24 DIAGNOSIS — Z87891 Personal history of nicotine dependence: Secondary | ICD-10-CM | POA: Diagnosis not present

## 2017-11-24 DIAGNOSIS — Z885 Allergy status to narcotic agent status: Secondary | ICD-10-CM | POA: Diagnosis not present

## 2017-11-24 DIAGNOSIS — Z880 Allergy status to penicillin: Secondary | ICD-10-CM | POA: Diagnosis not present

## 2017-11-24 DIAGNOSIS — M179 Osteoarthritis of knee, unspecified: Secondary | ICD-10-CM | POA: Diagnosis present

## 2017-11-24 DIAGNOSIS — Z7901 Long term (current) use of anticoagulants: Secondary | ICD-10-CM

## 2017-11-24 DIAGNOSIS — M21062 Valgus deformity, not elsewhere classified, left knee: Secondary | ICD-10-CM | POA: Diagnosis present

## 2017-11-24 DIAGNOSIS — Z96651 Presence of right artificial knee joint: Secondary | ICD-10-CM | POA: Diagnosis present

## 2017-11-24 DIAGNOSIS — F419 Anxiety disorder, unspecified: Secondary | ICD-10-CM | POA: Diagnosis present

## 2017-11-24 DIAGNOSIS — M1710 Unilateral primary osteoarthritis, unspecified knee: Secondary | ICD-10-CM | POA: Diagnosis present

## 2017-11-24 HISTORY — PX: TOTAL KNEE ARTHROPLASTY: SHX125

## 2017-11-24 LAB — TYPE AND SCREEN
ABO/RH(D): O POS
Antibody Screen: NEGATIVE

## 2017-11-24 LAB — GLUCOSE, CAPILLARY: Glucose-Capillary: 150 mg/dL — ABNORMAL HIGH (ref 70–99)

## 2017-11-24 SURGERY — ARTHROPLASTY, KNEE, TOTAL
Anesthesia: General | Laterality: Left

## 2017-11-24 MED ORDER — ACETAMINOPHEN 500 MG PO TABS: 1000.0000 mg | ORAL_TABLET | Freq: Once | ORAL | Status: DC | PRN

## 2017-11-24 MED ORDER — FENTANYL CITRATE (PF) 100 MCG/2ML IJ SOLN
INTRAMUSCULAR | Status: DC | PRN
  Administered 2017-11-24 (×7): 50 ug via INTRAVENOUS

## 2017-11-24 MED ORDER — FLEET ENEMA 7-19 GM/118ML RE ENEM: 1.0000 | ENEMA | Freq: Once | RECTAL | Status: DC | PRN

## 2017-11-24 MED ORDER — METHOCARBAMOL 500 MG IVPB - SIMPLE MED
INTRAVENOUS | Status: AC
  Filled 2017-11-24: qty 50

## 2017-11-24 MED ORDER — DEXAMETHASONE SODIUM PHOSPHATE 10 MG/ML IJ SOLN
INTRAMUSCULAR | Status: AC
  Filled 2017-11-24: qty 1

## 2017-11-24 MED ORDER — WARFARIN SODIUM 5 MG PO TABS: 5.0000 mg | ORAL_TABLET | Freq: Once | ORAL | Status: DC

## 2017-11-24 MED ORDER — DEXAMETHASONE SODIUM PHOSPHATE 10 MG/ML IJ SOLN
10.0000 mg | Freq: Once | INTRAMUSCULAR | Status: AC
  Administered 2017-11-25: 10 mg via INTRAVENOUS
  Filled 2017-11-24: qty 1

## 2017-11-24 MED ORDER — PANTOPRAZOLE SODIUM 40 MG PO TBEC
40.0000 mg | DELAYED_RELEASE_TABLET | Freq: Two times a day (BID) | ORAL | Status: DC
  Administered 2017-11-25 – 2017-11-26 (×3): 40 mg via ORAL
  Filled 2017-11-24 (×3): qty 1

## 2017-11-24 MED ORDER — VANCOMYCIN HCL IN DEXTROSE 1-5 GM/200ML-% IV SOLN
1000.0000 mg | INTRAVENOUS | Status: AC
  Administered 2017-11-24: 1000 mg via INTRAVENOUS
  Filled 2017-11-24: qty 200

## 2017-11-24 MED ORDER — LIDOCAINE 2% (20 MG/ML) 5 ML SYRINGE
INTRAMUSCULAR | Status: DC | PRN
  Administered 2017-11-24: 60 mg via INTRAVENOUS

## 2017-11-24 MED ORDER — FENTANYL CITRATE (PF) 100 MCG/2ML IJ SOLN
25.0000 ug | INTRAMUSCULAR | Status: DC | PRN
  Administered 2017-11-24 (×2): 25 ug via INTRAVENOUS
  Administered 2017-11-24 (×2): 50 ug via INTRAVENOUS

## 2017-11-24 MED ORDER — SODIUM CHLORIDE (PF) 0.9 % IJ SOLN
INTRAMUSCULAR | Status: DC | PRN
  Administered 2017-11-24: 60 mL

## 2017-11-24 MED ORDER — OXYCODONE HCL 5 MG/5ML PO SOLN: 5.0000 mg | Freq: Once | ORAL | Status: DC | PRN

## 2017-11-24 MED ORDER — FENTANYL CITRATE (PF) 250 MCG/5ML IJ SOLN
INTRAMUSCULAR | Status: AC
  Filled 2017-11-24: qty 5

## 2017-11-24 MED ORDER — FLECAINIDE ACETATE 50 MG PO TABS
50.0000 mg | ORAL_TABLET | Freq: Two times a day (BID) | ORAL | Status: DC
  Administered 2017-11-24 – 2017-11-26 (×4): 50 mg via ORAL
  Filled 2017-11-24 (×5): qty 1

## 2017-11-24 MED ORDER — SODIUM CHLORIDE (PF) 0.9 % IJ SOLN
INTRAMUSCULAR | Status: AC
  Filled 2017-11-24: qty 50

## 2017-11-24 MED ORDER — PROPOFOL 10 MG/ML IV BOLUS
INTRAVENOUS | Status: AC
  Filled 2017-11-24: qty 20

## 2017-11-24 MED ORDER — FENTANYL CITRATE (PF) 100 MCG/2ML IJ SOLN
50.0000 ug | INTRAMUSCULAR | Status: DC
  Administered 2017-11-24: 50 ug via INTRAVENOUS
  Filled 2017-11-24: qty 2

## 2017-11-24 MED ORDER — LACTATED RINGERS IV SOLN
INTRAVENOUS | Status: DC
  Administered 2017-11-24 (×2): via INTRAVENOUS

## 2017-11-24 MED ORDER — PHENYLEPHRINE 40 MCG/ML (10ML) SYRINGE FOR IV PUSH (FOR BLOOD PRESSURE SUPPORT)
PREFILLED_SYRINGE | INTRAVENOUS | Status: AC
  Filled 2017-11-24: qty 10

## 2017-11-24 MED ORDER — ACETAMINOPHEN 160 MG/5ML PO SOLN: 1000.0000 mg | Freq: Once | ORAL | Status: DC | PRN

## 2017-11-24 MED ORDER — HYDROCHLOROTHIAZIDE 25 MG PO TABS
25.0000 mg | ORAL_TABLET | Freq: Every day | ORAL | Status: DC
  Administered 2017-11-25 – 2017-11-26 (×2): 25 mg via ORAL
  Filled 2017-11-24 (×2): qty 1

## 2017-11-24 MED ORDER — WARFARIN SODIUM 4 MG PO TABS
4.5000 mg | ORAL_TABLET | Freq: Once | ORAL | Status: AC
  Administered 2017-11-24: 4.5 mg via ORAL
  Filled 2017-11-24: qty 1

## 2017-11-24 MED ORDER — SODIUM CHLORIDE 0.9 % IR SOLN
Status: DC | PRN
  Administered 2017-11-24: 1000 mL

## 2017-11-24 MED ORDER — ONDANSETRON HCL 4 MG/2ML IJ SOLN
INTRAMUSCULAR | Status: AC
  Filled 2017-11-24: qty 2

## 2017-11-24 MED ORDER — CHLORHEXIDINE GLUCONATE 4 % EX LIQD
60.0000 mL | Freq: Once | CUTANEOUS | Status: AC
  Administered 2017-11-24: 4 via TOPICAL

## 2017-11-24 MED ORDER — LOSARTAN POTASSIUM 25 MG PO TABS
25.0000 mg | ORAL_TABLET | Freq: Every day | ORAL | Status: DC
  Administered 2017-11-24 – 2017-11-25 (×2): 25 mg via ORAL
  Filled 2017-11-24 (×2): qty 1

## 2017-11-24 MED ORDER — DEXAMETHASONE SODIUM PHOSPHATE 10 MG/ML IJ SOLN: 8.0000 mg | Freq: Once | INTRAMUSCULAR | Status: DC

## 2017-11-24 MED ORDER — METHOCARBAMOL 500 MG PO TABS
500.0000 mg | ORAL_TABLET | Freq: Four times a day (QID) | ORAL | Status: DC | PRN
  Administered 2017-11-25 – 2017-11-26 (×5): 500 mg via ORAL
  Filled 2017-11-24 (×5): qty 1

## 2017-11-24 MED ORDER — SODIUM CHLORIDE 0.9 % IV SOLN
INTRAVENOUS | Status: DC
  Administered 2017-11-24: 15:00:00 via INTRAVENOUS

## 2017-11-24 MED ORDER — ONDANSETRON HCL 4 MG/2ML IJ SOLN
4.0000 mg | Freq: Four times a day (QID) | INTRAMUSCULAR | Status: DC | PRN
  Administered 2017-11-24 – 2017-11-25 (×2): 4 mg via INTRAVENOUS
  Filled 2017-11-24 (×2): qty 2

## 2017-11-24 MED ORDER — FENTANYL CITRATE (PF) 100 MCG/2ML IJ SOLN
INTRAMUSCULAR | Status: AC
  Filled 2017-11-24: qty 2

## 2017-11-24 MED ORDER — BUPIVACAINE LIPOSOME 1.3 % IJ SUSP
20.0000 mL | Freq: Once | INTRAMUSCULAR | Status: DC
  Filled 2017-11-24: qty 20

## 2017-11-24 MED ORDER — NITROGLYCERIN 0.4 MG SL SUBL: 0.4000 mg | SUBLINGUAL_TABLET | SUBLINGUAL | Status: DC | PRN

## 2017-11-24 MED ORDER — PHENOL 1.4 % MT LIQD
1.0000 | OROMUCOSAL | Status: DC | PRN
  Filled 2017-11-24: qty 177

## 2017-11-24 MED ORDER — ACETAMINOPHEN 10 MG/ML IV SOLN: 1000.0000 mg | Freq: Once | INTRAVENOUS | Status: DC | PRN

## 2017-11-24 MED ORDER — TRAMADOL HCL 50 MG PO TABS
50.0000 mg | ORAL_TABLET | Freq: Four times a day (QID) | ORAL | Status: DC | PRN
  Administered 2017-11-25: 100 mg via ORAL
  Administered 2017-11-25: 50 mg via ORAL
  Administered 2017-11-26: 100 mg via ORAL
  Filled 2017-11-24: qty 1
  Filled 2017-11-24 (×2): qty 2

## 2017-11-24 MED ORDER — ATORVASTATIN CALCIUM 10 MG PO TABS
10.0000 mg | ORAL_TABLET | Freq: Every day | ORAL | Status: DC
  Administered 2017-11-24 – 2017-11-25 (×2): 10 mg via ORAL
  Filled 2017-11-24 (×2): qty 1

## 2017-11-24 MED ORDER — DIPHENHYDRAMINE HCL 12.5 MG/5ML PO ELIX: 12.5000 mg | ORAL_SOLUTION | ORAL | Status: DC | PRN

## 2017-11-24 MED ORDER — BUPIVACAINE LIPOSOME 1.3 % IJ SUSP
INTRAMUSCULAR | Status: DC | PRN
  Administered 2017-11-24: 20 mL

## 2017-11-24 MED ORDER — ROPIVACAINE HCL 7.5 MG/ML IJ SOLN
INTRAMUSCULAR | Status: DC | PRN
  Administered 2017-11-24: 20 mL via PERINEURAL

## 2017-11-24 MED ORDER — METHOCARBAMOL 500 MG IVPB - SIMPLE MED
500.0000 mg | Freq: Four times a day (QID) | INTRAVENOUS | Status: DC | PRN
  Administered 2017-11-24: 500 mg via INTRAVENOUS
  Filled 2017-11-24: qty 50

## 2017-11-24 MED ORDER — DOCUSATE SODIUM 100 MG PO CAPS
100.0000 mg | ORAL_CAPSULE | Freq: Two times a day (BID) | ORAL | Status: DC
  Administered 2017-11-24 – 2017-11-26 (×4): 100 mg via ORAL
  Filled 2017-11-24 (×4): qty 1

## 2017-11-24 MED ORDER — MORPHINE SULFATE (PF) 4 MG/ML IV SOLN
1.0000 mg | INTRAVENOUS | Status: DC | PRN
  Administered 2017-11-24 (×3): 1 mg via INTRAVENOUS
  Filled 2017-11-24 (×3): qty 1

## 2017-11-24 MED ORDER — ENOXAPARIN SODIUM 40 MG/0.4ML ~~LOC~~ SOLN
40.0000 mg | SUBCUTANEOUS | Status: DC
  Administered 2017-11-25 – 2017-11-26 (×2): 40 mg via SUBCUTANEOUS
  Filled 2017-11-24 (×2): qty 0.4

## 2017-11-24 MED ORDER — PHENYLEPHRINE 40 MCG/ML (10ML) SYRINGE FOR IV PUSH (FOR BLOOD PRESSURE SUPPORT)
PREFILLED_SYRINGE | INTRAVENOUS | Status: DC | PRN
  Administered 2017-11-24 (×4): 80 ug via INTRAVENOUS
  Administered 2017-11-24: 40 ug via INTRAVENOUS

## 2017-11-24 MED ORDER — MENTHOL 3 MG MT LOZG: 1.0000 | LOZENGE | OROMUCOSAL | Status: DC | PRN

## 2017-11-24 MED ORDER — POLYETHYLENE GLYCOL 3350 17 G PO PACK: 17.0000 g | PACK | Freq: Every day | ORAL | Status: DC | PRN

## 2017-11-24 MED ORDER — SODIUM CHLORIDE (PF) 0.9 % IJ SOLN
INTRAMUSCULAR | Status: AC
  Filled 2017-11-24: qty 10

## 2017-11-24 MED ORDER — OXYCODONE HCL 5 MG PO TABS: 5.0000 mg | ORAL_TABLET | Freq: Once | ORAL | Status: DC | PRN

## 2017-11-24 MED ORDER — TRANEXAMIC ACID-NACL 1000-0.7 MG/100ML-% IV SOLN
1000.0000 mg | INTRAVENOUS | Status: AC
  Administered 2017-11-24: 1000 mg via INTRAVENOUS
  Filled 2017-11-24: qty 100

## 2017-11-24 MED ORDER — DEXAMETHASONE SODIUM PHOSPHATE 10 MG/ML IJ SOLN
INTRAMUSCULAR | Status: DC | PRN
  Administered 2017-11-24: 10 mg via INTRAVENOUS

## 2017-11-24 MED ORDER — PROPOFOL 10 MG/ML IV BOLUS
INTRAVENOUS | Status: AC
  Filled 2017-11-24: qty 40

## 2017-11-24 MED ORDER — OXYCODONE HCL 5 MG PO TABS
5.0000 mg | ORAL_TABLET | ORAL | Status: DC | PRN
  Administered 2017-11-25 (×3): 5 mg via ORAL
  Administered 2017-11-26 (×2): 10 mg via ORAL
  Administered 2017-11-26: 5 mg via ORAL
  Administered 2017-11-26: 10 mg via ORAL
  Filled 2017-11-24: qty 1
  Filled 2017-11-24: qty 2
  Filled 2017-11-24 (×2): qty 1
  Filled 2017-11-24 (×2): qty 2
  Filled 2017-11-24: qty 1

## 2017-11-24 MED ORDER — ACETAMINOPHEN 10 MG/ML IV SOLN
1000.0000 mg | Freq: Four times a day (QID) | INTRAVENOUS | Status: DC
  Administered 2017-11-24: 1000 mg via INTRAVENOUS
  Filled 2017-11-24: qty 100

## 2017-11-24 MED ORDER — WARFARIN - PHARMACIST DOSING INPATIENT: Freq: Every day | Status: DC

## 2017-11-24 MED ORDER — BISACODYL 10 MG RE SUPP: 10.0000 mg | Freq: Every day | RECTAL | Status: DC | PRN

## 2017-11-24 MED ORDER — PROPOFOL 10 MG/ML IV BOLUS
INTRAVENOUS | Status: DC | PRN
  Administered 2017-11-24: 100 mg via INTRAVENOUS
  Administered 2017-11-24: 20 mg via INTRAVENOUS

## 2017-11-24 MED ORDER — ONDANSETRON HCL 4 MG/2ML IJ SOLN
INTRAMUSCULAR | Status: DC | PRN
  Administered 2017-11-24: 4 mg via INTRAVENOUS

## 2017-11-24 MED ORDER — MIDAZOLAM HCL 2 MG/2ML IJ SOLN
1.0000 mg | INTRAMUSCULAR | Status: DC
  Filled 2017-11-24: qty 2

## 2017-11-24 MED ORDER — ACETAMINOPHEN 500 MG PO TABS
1000.0000 mg | ORAL_TABLET | Freq: Four times a day (QID) | ORAL | Status: AC
  Administered 2017-11-24 – 2017-11-25 (×2): 1000 mg via ORAL
  Filled 2017-11-24 (×3): qty 2

## 2017-11-24 MED ORDER — VANCOMYCIN HCL IN DEXTROSE 1-5 GM/200ML-% IV SOLN
1000.0000 mg | Freq: Two times a day (BID) | INTRAVENOUS | Status: AC
  Administered 2017-11-25: 1000 mg via INTRAVENOUS
  Filled 2017-11-24: qty 200

## 2017-11-24 MED ORDER — ONDANSETRON HCL 4 MG PO TABS
4.0000 mg | ORAL_TABLET | Freq: Four times a day (QID) | ORAL | Status: DC | PRN
  Administered 2017-11-25: 4 mg via ORAL
  Filled 2017-11-24: qty 1

## 2017-11-24 MED ORDER — LIDOCAINE 2% (20 MG/ML) 5 ML SYRINGE
INTRAMUSCULAR | Status: AC
  Filled 2017-11-24: qty 5

## 2017-11-24 MED ORDER — EPHEDRINE SULFATE-NACL 50-0.9 MG/10ML-% IV SOSY
PREFILLED_SYRINGE | INTRAVENOUS | Status: DC | PRN
  Administered 2017-11-24 (×2): 5 mg via INTRAVENOUS
  Administered 2017-11-24: 10 mg via INTRAVENOUS

## 2017-11-24 SURGICAL SUPPLY — 54 items
BAG SPEC THK2 15X12 ZIP CLS (MISCELLANEOUS) ×1
BAG ZIPLOCK 12X15 (MISCELLANEOUS) ×2 IMPLANT
BANDAGE ACE 6X5 VEL STRL LF (GAUZE/BANDAGES/DRESSINGS) ×2 IMPLANT
BLADE SAG 18X100X1.27 (BLADE) ×2 IMPLANT
BLADE SAW SGTL 11.0X1.19X90.0M (BLADE) ×2 IMPLANT
BOWL SMART MIX CTS (DISPOSABLE) ×2 IMPLANT
CEMENT HV SMART SET (Cement) ×4 IMPLANT
CEMENT TIBIA MBT (Knees) IMPLANT
CLSR STERI-STRIP ANTIMIC 1/2X4 (GAUZE/BANDAGES/DRESSINGS) ×1 IMPLANT
COVER SURGICAL LIGHT HANDLE (MISCELLANEOUS) ×2 IMPLANT
COVER WAND RF STERILE (DRAPES) ×1 IMPLANT
CUFF TOURN SGL QUICK 34 (TOURNIQUET CUFF) ×2
CUFF TRNQT CYL 34X4X40X1 (TOURNIQUET CUFF) ×1 IMPLANT
DECANTER SPIKE VIAL GLASS SM (MISCELLANEOUS) ×3 IMPLANT
DRAPE U-SHAPE 47X51 STRL (DRAPES) ×2 IMPLANT
DRSG ADAPTIC 3X8 NADH LF (GAUZE/BANDAGES/DRESSINGS) ×2 IMPLANT
DRSG PAD ABDOMINAL 8X10 ST (GAUZE/BANDAGES/DRESSINGS) ×2 IMPLANT
DURAPREP 26ML APPLICATOR (WOUND CARE) ×2 IMPLANT
ELECT REM PT RETURN 15FT ADLT (MISCELLANEOUS) ×2 IMPLANT
EVACUATOR 1/8 PVC DRAIN (DRAIN) ×2 IMPLANT
FEMUR SIGMA PS KNEE SZ 4.0N L (Femur) ×1 IMPLANT
GAUZE SPONGE 4X4 12PLY STRL (GAUZE/BANDAGES/DRESSINGS) ×2 IMPLANT
GLOVE BIO SURGEON STRL SZ8 (GLOVE) ×2 IMPLANT
GLOVE BIOGEL PI IND STRL 6.5 (GLOVE) ×1 IMPLANT
GLOVE BIOGEL PI IND STRL 8 (GLOVE) ×1 IMPLANT
GLOVE BIOGEL PI INDICATOR 6.5 (GLOVE) ×1
GLOVE BIOGEL PI INDICATOR 8 (GLOVE) ×1
GLOVE SURG SS PI 6.5 STRL IVOR (GLOVE) ×2 IMPLANT
GOWN STRL REUS W/TWL LRG LVL3 (GOWN DISPOSABLE) ×4 IMPLANT
GOWN STRL REUS W/TWL XL LVL3 (GOWN DISPOSABLE) ×2 IMPLANT
HANDPIECE INTERPULSE COAX TIP (DISPOSABLE) ×2
HOLDER FOLEY CATH W/STRAP (MISCELLANEOUS) IMPLANT
IMMOBILIZER KNEE 20 (SOFTGOODS) ×3 IMPLANT
IMMOBILIZER KNEE 20 THIGH 36 (SOFTGOODS) ×1 IMPLANT
MANIFOLD NEPTUNE II (INSTRUMENTS) ×2 IMPLANT
PACK TOTAL KNEE CUSTOM (KITS) ×2 IMPLANT
PAD ABD 8X10 STRL (GAUZE/BANDAGES/DRESSINGS) ×1 IMPLANT
PADDING CAST COTTON 6X4 STRL (CAST SUPPLIES) ×4 IMPLANT
PATELLA DOME PFC 38MM (Knees) ×1 IMPLANT
PIN STEINMAN FIXATION KNEE (PIN) ×1 IMPLANT
PLATE ROT INSERT 12.5MM SIZE 4 (Plate) ×1 IMPLANT
PROTECTOR NERVE ULNAR (MISCELLANEOUS) ×2 IMPLANT
SET HNDPC FAN SPRY TIP SCT (DISPOSABLE) ×1 IMPLANT
STRIP CLOSURE SKIN 1/2X4 (GAUZE/BANDAGES/DRESSINGS) ×4 IMPLANT
SUT MNCRL AB 4-0 PS2 18 (SUTURE) ×2 IMPLANT
SUT STRATAFIX 0 PDS 27 VIOLET (SUTURE) ×2
SUT VIC AB 2-0 CT1 27 (SUTURE) ×6
SUT VIC AB 2-0 CT1 TAPERPNT 27 (SUTURE) ×3 IMPLANT
SUTURE STRATFX 0 PDS 27 VIOLET (SUTURE) ×1 IMPLANT
TIBIA MBT CEMENT (Knees) ×2 IMPLANT
TRAY FOLEY MTR SLVR 16FR STAT (SET/KITS/TRAYS/PACK) ×2 IMPLANT
WATER STERILE IRR 1000ML POUR (IV SOLUTION) ×4 IMPLANT
WRAP KNEE MAXI GEL POST OP (GAUZE/BANDAGES/DRESSINGS) ×2 IMPLANT
YANKAUER SUCT BULB TIP 10FT TU (MISCELLANEOUS) ×2 IMPLANT

## 2017-11-24 NOTE — Evaluation (Signed)
Physical Therapy Evaluation Patient Details Name: Gloria [name] MRN: [id number] DOB: 08/26/1930 Today's Date: 11/24/2017   History of Present Illness  84 yo female s/p L TKR on 11/24/17. PMH includes TIA, HTN, afib, IBS, hep C, HLD, meningioma, THA, R TKR, ORIF for periprosthetic fracture.   Clinical Impression   Pt presents with L knee pain, decreased L knee ROM, nausea and vomiting during eval, difficulty performing mobility tasks, and decreased tolerance to activity. Pt to benefit from acute PT to address deficits. Pt tolerated stand pivot to the recliner during eval, but pain and n/v limiting mobility this session. PT to progress mobility as tolerated, and will continue to follow acutely.      Follow Up Recommendations Follow surgeon's recommendation for DC plan and follow-up therapies;Supervision for mobility/OOB    Equipment Recommendations  None recommended by PT    Recommendations for Other Services       Precautions / Restrictions Precautions Precautions: Fall Required Braces or Orthoses: Knee Immobilizer - Left Knee Immobilizer - Left: On when out of bed or walking;Discontinue once straight leg raise with < 10 degree lag Restrictions Weight Bearing Restrictions: No Other Position/Activity Restrictions: WBAT       Mobility  Bed Mobility Overal bed mobility: Needs Assistance Bed Mobility: Supine to Sit     Supine to sit: Min assist;HOB elevated     General bed mobility comments: Prior to mobilization during subjective portion of exam, pt with vomiting. Pt highly motivated to mobilize regardless. min assist for supine to sit for LLE management, scooting to EOB. Increased time and effort.   Transfers Overall transfer level: Needs assistance Equipment used: Rolling walker (2 wheeled) Transfers: Sit to/from Stand Sit to Stand: Min assist;From elevated surface         General transfer comment: Min assist for power up and steadying upon standing. Verbal cuing  for hand placement. Pt with stand pivot with min guard for safety to recliner, taking 4 steps total during transfer. Verbal cuing for sequencing, placement in RW.   Ambulation/Gait Ambulation/Gait assistance: (NT - pt with nausea, vomiting, high level of L knee pain )              Stairs            Wheelchair Mobility    Modified Rankin (Stroke Patients Only)       Balance Overall balance assessment: Mild deficits observed, not formally tested                                           Pertinent Vitals/Pain Pain Assessment: 0-10 Pain Score: 7  Pain Location: L knee  Pain Descriptors / Indicators: Aching Pain Intervention(s): Limited activity within patient's tolerance;Repositioned;Monitored during session    Home Living Family/patient expects to be discharged to:: Private residence Living Arrangements: Alone Available Help at Discharge: Family;Available 24 hours/day(daughter will stay with pt for as long as she needs assist) Type of Home: House Home Access: Stairs to enter Entrance Stairs-Rails: Left Entrance Stairs-Number of Steps: 2 Home Layout: One level Home Equipment: Cane - single point;Grab bars - tub/shower;Shower seat;Walker - 2 wheels      Prior Function Level of Independence: Independent with assistive device(s)         Comments: pt reports using cane for ambulation PTA. 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      Hand Dominance  Dominant Hand: Right    Extremity/Trunk Assessment   Upper Extremity Assessment Upper Extremity Assessment: Overall WFL for tasks assessed    Lower Extremity Assessment Lower Extremity Assessment: Generalized weakness;LLE deficits/detail LLE Deficits / Details: suspected post-surgical weakness; able to perform quad set, ankle pumps  LLE Sensation: WNL    Cervical / Trunk Assessment Cervical / Trunk Assessment: Kyphotic  Communication   Communication: No difficulties   Cognition Arousal/Alertness: Awake/alert Behavior During Therapy: WFL for tasks assessed/performed Overall Cognitive Status: Within Functional Limits for tasks assessed                                        General Comments      Exercises Total Joint Exercises Ankle Circles/Pumps: AROM;Both;5 reps;Seated Goniometric ROM: Knee AAROM ~5-60*, limited by pain and stiffness   Assessment/Plan    PT Assessment Patient needs continued PT services  PT Problem List Decreased strength;Pain;Decreased range of motion;Decreased activity tolerance;Decreased knowledge of use of DME;Decreased balance;Decreased mobility       PT Treatment Interventions DME instruction;Therapeutic activities;Gait training;Therapeutic exercise;Patient/family education;Stair training;Balance training;Functional mobility training    PT Goals (Current goals can be found in the Care Plan section)  Acute Rehab PT Goals PT Goal Formulation: With patient Time For Goal Achievement: 12/01/17 Potential to Achieve Goals: Good    Frequency 7X/week   Barriers to discharge        Co-evaluation               AM-PAC PT "6 Clicks" Daily Activity  Outcome Measure Difficulty turning over in bed (including adjusting bedclothes, sheets and blankets)?: Unable Difficulty moving from lying on back to sitting on the side of the bed? : Unable Difficulty sitting down on and standing up from a chair with arms (e.g., wheelchair, bedside commode, etc,.)?: Unable Help needed moving to and from a bed to chair (including a wheelchair)?: A Little Help needed walking in hospital room?: A Little Help needed climbing 3-5 steps with a railing? : A Little 6 Click Score: 12    End of Session Equipment Utilized During Treatment: Gait belt Activity Tolerance: Patient limited by pain;Treatment limited secondary to medical complications (Comment)(nausea and vomiting during session) Patient left: in chair;with chair alarm  set;with call bell/phone within reach;with SCD's reapplied Nurse Communication: Mobility status PT Visit Diagnosis: Other abnormalities of gait and mobility (R26.89);Difficulty in walking, not elsewhere classified (R26.2)    Time: 7867-6720 PT Time Calculation (min) (ACUTE ONLY): 37 min   Charges:   PT Evaluation $PT Eval Low Complexity: 1 Low PT Treatments $Therapeutic Activity: 8-22 mins        Julien Girt, PT Acute Rehabilitation Services Pager [phone]  Office [phone]   Madigan Rosensteel D Elonda Husky 11/24/2017, 7:39 PM

## 2017-11-24 NOTE — Anesthesia Preprocedure Evaluation (Signed)
Anesthesia Evaluation  Patient identified by MRN, date of birth, ID band Patient awake    Reviewed: Allergy & Precautions, NPO status , Patient's Chart, lab work & pertinent test results  History of Anesthesia Complications (+) PONV and history of anesthetic complications  Airway Mallampati: II  TM Distance: >3 FB Neck ROM: Full    Dental  (+) Teeth Intact   Pulmonary shortness of breath and with exertion, former smoker,    breath sounds clear to auscultation       Cardiovascular hypertension, Pt. on medications (-) angina(-) Past MI and (-) CHF + dysrhythmias Atrial Fibrillation  Rhythm:Regular Rate:Normal     Neuro/Psych  Headaches, Prior parotid surgery with right facial droop, tia x1 2018 TIA   GI/Hepatic GERD  Medicated and Controlled,(+) Hepatitis -, C  Endo/Other  negative endocrine ROS  Renal/GU negative Renal ROS     Musculoskeletal  (+) Arthritis ,   Abdominal   Peds  Hematology Therapeutic coumadin transition to lovenox, last took 11/17 1900   Anesthesia Other Findings   Reproductive/Obstetrics                             Anesthesia Physical Anesthesia Plan  ASA: III  Anesthesia Plan: General and Regional   Post-op Pain Management:    Induction: Intravenous  PONV Risk Score and Plan: 4 or greater and Ondansetron and Dexamethasone  Airway Management Planned: LMA  Additional Equipment: None  Intra-op Plan:   Post-operative Plan: Extubation in OR  Informed Consent: I have reviewed the patients History and Physical, chart, labs and discussed the procedure including the risks, benefits and alternatives for the proposed anesthesia with the patient or authorized representative who has indicated his/her understanding and acceptance.   Dental advisory given  Plan Discussed with: CRNA and Surgeon  Anesthesia Plan Comments:         Anesthesia Quick Evaluation

## 2017-11-24 NOTE — Anesthesia Procedure Notes (Signed)
Anesthesia Regional Block: Adductor canal block   Pre-Anesthetic Checklist: ,, timeout performed, Correct Patient, Correct Site, Correct Laterality, Correct Procedure, Correct Position, site marked, Risks and benefits discussed,  Surgical consent,  Pre-op evaluation,  At surgeon's request and post-op pain management  Laterality: Left and Lower  Prep: chloraprep       Needles:  Injection technique: Single-shot     Needle Length: 9cm  Needle Gauge: 22     Additional Needles: Arrow StimuQuik ECHO Echogenic Stimulating PNB Needle  Procedures:,,,, ultrasound used (permanent image in chart),,,,  Narrative:  Start time: 11/24/2017 11:41 AM End time: 11/24/2017 11:46 AM Injection made incrementally with aspirations every 5 mL.  Performed by: Personally  Anesthesiologist: Oleta Mouse, MD

## 2017-11-24 NOTE — Progress Notes (Addendum)
ANTICOAGULATION CONSULT NOTE - Initial Consult  Pharmacy Consult for Warfarin  Indication: history of atrial fibrillation, VTE prophylaxis post-operatively   Allergies  Allergen Reactions  . Amlodipine Swelling  . Cephalexin Other (See Comments)    unknown  . Codeine Other (See Comments)    unknown  . Metoclopramide Hcl Other (See Comments)    unknown  . Penicillins Other (See Comments)    Told my MD that she was allergic     Patient Measurements: Height: 5\' 5"  (165.1 cm) Weight: 138 lb 14.2 oz (63 kg) IBW/kg (Calculated) : 57  Vital Signs: Temp: 97.7 F (36.5 C) (11/18 1620) Temp Source: Oral (11/18 1620) BP: 127/57 (11/18 1620) Pulse Rate: 47 (11/18 1620)  Labs: No results for input(s): HGB, HCT, PLT, APTT, LABPROT, INR, HEPARINUNFRC, HEPRLOWMOCWT, CREATININE, CKTOTAL, CKMB, TROPONINI in the last 72 hours.  Estimated Creatinine Clearance: 46.3 mL/min (by C-G formula based on SCr of 0.79 mg/dL).   Medical History: Past Medical History:  Diagnosis Date  . Arthritis    right  . Atrial fibrillation ([place])   . GERD (gastroesophageal reflux disease)    hx of  . Hepatitis C   . Humerus fracture   . Hyperlipidemia   . Hypertension   . Irritable bowel syndrome   . Meningioma ([place])   . PONV (postoperative nausea and vomiting)     Assessment: 69 y/oF with PMH of a-fib, TIA on chronic warfarin therapy who underwent L TKA today. Pharmacy asked to resume warfarin post-operatively. PTA Warfarin dose reported as 3mg  on M/W/F and 6mg  on all other days of the week, with last dose on 11/19/17. Was bridged with enoxaparin 60mg  SQ q12h for procedure, last dose on 11/23/17 at 1900. Last INR on 11/17/17 = 1.89. CBC from 11/17/17 WNL.   Goal of Therapy:  INR 2-3 Monitor platelets by anticoagulation protocol: Yes   Plan:   Warfarin 4.5mg  PO x 1  Enoxaparin 40mg  SQ q24h to start on 11/25/17 at 0800 per MD. D/C when INR =/> 2.   Daily PT/INR  Monitor CBC and for s/sx of  bleeding.    Lindell Spar, PharmD, BCPS Pager: [phone] 11/24/2017 4:41 PM

## 2017-11-24 NOTE — Care Plan (Signed)
Ortho Bundle Case Management Note  Patient Details  Name: Gloria [name] MRN: [id number] Date of Birth: [birth year]  L TKA scheduled on 11-24-17 DCP:  Home with dtr.  1 story home with 2 ste. DME:  No needs.  Has a RW and 3-in-1. PT:  [facility].  PT eval scheduled on 11-28-17.                   DME Arranged:  N/A DME Agency:  NA  HH Arranged:  NA HH Agency:  NA  Additional Comments: Please contact me with any questions of if this plan should need to change.  Marianne Sofia, RN,CCM [facility]  [phone] 11/24/2017, 10:52 AM

## 2017-11-24 NOTE — Transfer of Care (Signed)
Immediate Anesthesia Transfer of Care Note  Patient: Gloria [name]  Procedure(s) Performed: TOTAL KNEE ARTHROPLASTY (Left )  Patient Location: PACU  Anesthesia Type:General  Level of Consciousness: awake, alert , oriented and patient cooperative  Airway & Oxygen Therapy: Patient Spontanous Breathing and Patient connected to face mask oxygen  Post-op Assessment: Report given to RN, Post -op Vital signs reviewed and stable and Patient moving all extremities  Post vital signs: Reviewed and stable  Last Vitals:  Vitals Value Taken Time  BP    Temp    Pulse    Resp    SpO2      Last Pain:  Vitals:   11/24/17 1148  TempSrc:   PainSc: 0-No pain         Complications: No apparent anesthesia complications

## 2017-11-24 NOTE — Progress Notes (Signed)
Assisted Dr. Moser with left, ultrasound guided, adductor canal block. Side rails up, monitors on throughout procedure. See vital signs in flow sheet. Tolerated Procedure well.  

## 2017-11-24 NOTE — Anesthesia Postprocedure Evaluation (Signed)
Anesthesia Post Note  Patient: Gloria [name]  Procedure(s) Performed: TOTAL KNEE ARTHROPLASTY (Left )     Patient location during evaluation: PACU Anesthesia Type: General Level of consciousness: awake and alert Pain management: pain level controlled Vital Signs Assessment: post-procedure vital signs reviewed and stable Respiratory status: spontaneous breathing, nonlabored ventilation, respiratory function stable and patient connected to nasal cannula oxygen Cardiovascular status: blood pressure returned to baseline and stable Postop Assessment: no apparent nausea or vomiting Anesthetic complications: no    Last Vitals:  Vitals:   11/24/17 1430 11/24/17 1455  BP: (!) 142/59 (!) 150/85  Pulse: 82 81  Resp: 16 (!) 24  Temp: (!) 36.3 C 36.4 C  SpO2: 97% 100%    Last Pain:  Vitals:   11/24/17 1455  TempSrc: Oral  PainSc:                  Roby Donaway

## 2017-11-24 NOTE — Anesthesia Procedure Notes (Signed)
Procedure Name: LMA Insertion Date/Time: 11/24/2017 11:59 AM Performed by: Mitzie Na, CRNA Pre-anesthesia Checklist: Patient identified, Emergency Drugs available, Suction available, Timeout performed and Patient being monitored Patient Re-evaluated:Patient Re-evaluated prior to induction Oxygen Delivery Method: Circle system utilized Preoxygenation: Pre-oxygenation with 100% oxygen Induction Type: IV induction LMA: LMA inserted LMA Size: 4.0 Number of attempts: 1 Placement Confirmation: CO2 detector Tube secured with: Tape Dental Injury: Teeth and Oropharynx as per pre-operative assessment

## 2017-11-24 NOTE — Op Note (Signed)
OPERATIVE REPORT-TOTAL KNEE ARTHROPLASTY   Pre-operative diagnosis- Osteoarthritis  Left knee(s)  Post-operative diagnosis- Osteoarthritis Left knee(s)  Procedure-  Left  Total Knee Arthroplasty  Surgeon- Gloria Plover. Saloni Lablanc, MD  Assistant- Ardeen Jourdain, PA-C   Anesthesia-  GA combined with regional for post-op pain  EBL-50 mL   Drains Hemovac  Tourniquet time-  Total Tourniquet Time Documented: Thigh (Left) - 34 minutes Total: Thigh (Left) - 34 minutes     Complications- None  Condition-PACU - hemodynamically stable.   Brief Clinical Note  Gloria [name] is a 84 y.o. year old female with end stage OA of her left knee with progressively worsening pain and dysfunction. She has constant pain, with activity and at rest and significant functional deficits with difficulties even with ADLs. She has had extensive non-op management including analgesics, injections of cortisone and viscosupplements, and home exercise program, but remains in significant pain with significant dysfunction. Radiographs show bone on bone arthritis lateral and patellofemoral. She presents now for left Total Knee Arthroplasty.    Procedure in detail---   The patient is brought into the operating room and positioned supine on the operating table. After successful administration of GA combined with regional for post-op pain,   a tourniquet is placed high on the  Left thigh(s) and the lower extremity is prepped and draped in the usual sterile fashion. Time out is performed by the operating team and then the  Left lower extremity is wrapped in Esmarch, knee flexed and the tourniquet inflated to 300 mmHg.       A midline incision is made with a ten blade through the subcutaneous tissue to the level of the extensor mechanism. A fresh blade is used to make a medial parapatellar arthrotomy. Soft tissue over the proximal medial tibia is subperiosteally elevated to the joint line with a knife and into the  semimembranosus bursa with a Cobb elevator. Soft tissue over the proximal lateral tibia is elevated with attention being paid to avoiding the patellar tendon on the tibial tubercle. The patella is everted, knee flexed 90 degrees and the ACL and PCL are removed. Findings are bone on bone lateral and patellofemoral with valgus deformity        The drill is used to create a starting hole in the distal femur and the canal is thoroughly irrigated with sterile saline to remove the fatty contents. The 5 degree Left  valgus alignment guide is placed into the femoral canal and the distal femoral cutting block is pinned to remove 10 mm off the distal femur. Resection is made with an oscillating saw.      The tibia is subluxed forward and the menisci are removed. The extramedullary alignment guide is placed referencing proximally at the medial aspect of the tibial tubercle and distally along the second metatarsal axis and tibial crest. The block is pinned to remove 87mm off the more deficient lateral  side. Resection is made with an oscillating saw. Size 3is the most appropriate size for the tibia and the proximal tibia is prepared with the modular drill and keel punch for that size.      The femoral sizing guide is placed and size 4 is most appropriate. Rotation is marked off the epicondylar axis and confirmed by creating a rectangular flexion gap at 90 degrees. The size 4 cutting block is pinned in this rotation and the anterior, posterior and chamfer cuts are made with the oscillating saw. The intercondylar block is then placed and that cut is  made.      Trial size 3 tibial component, trial size 4 narrow posterior stabilized femur and a 12.5  mm posterior stabilized rotating platform insert trial is placed. Full extension is achieved with excellent varus/valgus and anterior/posterior balance throughout full range of motion. The patella is everted and thickness measured to be 22  mm. Free hand resection is taken to 12 mm,  a 38 template is placed, lug holes are drilled, trial patella is placed, and it tracks normally. Osteophytes are removed off the posterior femur with the trial in place. All trials are removed and the cut bone surfaces prepared with pulsatile lavage. Cement is mixed and once ready for implantation, the size 3 tibial implant, size  4 narrow posterior stabilized femoral component, and the size 38 patella are cemented in place and the patella is held with the clamp. The trial insert is placed and the knee held in full extension. The Exparel (20 ml mixed with 60 ml saline) is injected into the extensor mechanism, posterior capsule, medial and lateral gutters and subcutaneous tissues.  All extruded cement is removed and once the cement is hard the permanent 12.5 mm posterior stabilized rotating platform insert is placed into the tibial tray.      The wound is copiously irrigated with saline solution and the extensor mechanism closed over a hemovac drain with #1 V-loc suture. The tourniquet is released for a total tourniquet time of 34  minutes. Flexion against gravity is 140 degrees and the patella tracks normally. Subcutaneous tissue is closed with 2.0 vicryl and subcuticular with running 4.0 Monocryl. The incision is cleaned and dried and steri-strips and a bulky sterile dressing are applied. The limb is placed into a knee immobilizer and the patient is awakened and transported to recovery in stable condition.      Please note that a surgical assistant was a medical necessity for this procedure in order to perform it in a safe and expeditious manner. Surgical assistant was necessary to retract the ligaments and vital neurovascular structures to prevent injury to them and also necessary for proper positioning of the limb to allow for anatomic placement of the prosthesis.   Gloria Plover Eisley Barber, MD    11/24/2017, 12:59 PM

## 2017-11-24 NOTE — Interval H&P Note (Signed)
History and Physical Interval Note:  11/24/2017 10:47 AM  Gloria [name]  has presented today for surgery, with the diagnosis of left knee osteoarthritis  The various methods of treatment have been discussed with the patient and family. After consideration of risks, benefits and other options for treatment, the patient has consented to  Procedure(s) with comments: TOTAL KNEE ARTHROPLASTY (Left) - 19min as a surgical intervention .  The patient's history has been reviewed, patient examined, no change in status, stable for surgery.  I have reviewed the patient's chart and labs.  Questions were answered to the patient's satisfaction.     Pilar Plate Amelita Risinger

## 2017-11-25 LAB — PROTIME-INR
INR: 1.11
Prothrombin Time: 14.2 seconds (ref 11.4–15.2)

## 2017-11-25 LAB — CBC
HCT: 34.5 % — ABNORMAL LOW (ref 36.0–46.0)
Hemoglobin: 10.7 g/dL — ABNORMAL LOW (ref 12.0–15.0)
MCH: 29.2 pg (ref 26.0–34.0)
MCHC: 31 g/dL (ref 30.0–36.0)
MCV: 94.3 fL (ref 80.0–100.0)
Platelets: 193 10*3/uL (ref 150–400)
RBC: 3.66 MIL/uL — ABNORMAL LOW (ref 3.87–5.11)
RDW: 13.3 % (ref 11.5–15.5)
WBC: 12.1 10*3/uL — ABNORMAL HIGH (ref 4.0–10.5)
nRBC: 0 % (ref 0.0–0.2)

## 2017-11-25 LAB — BASIC METABOLIC PANEL
Anion gap: 9 (ref 5–15)
BUN: 13 mg/dL (ref 8–23)
CO2: 26 mmol/L (ref 22–32)
Calcium: 8.9 mg/dL (ref 8.9–10.3)
Chloride: 105 mmol/L (ref 98–111)
Creatinine, Ser: 0.64 mg/dL (ref 0.44–1.00)
GFR calc Af Amer: >60 mL/min (ref >60)
GFR calc non Af Amer: >60 mL/min (ref >60)
Glucose, Bld: 134 mg/dL — ABNORMAL HIGH (ref 70–99)
Potassium: 3.8 mmol/L (ref 3.5–5.1)
Sodium: 140 mmol/L (ref 135–145)

## 2017-11-25 MED ORDER — METHOCARBAMOL 500 MG PO TABS: 500.0000 mg | ORAL_TABLET | Freq: Four times a day (QID) | ORAL | 0 refills | Status: DC | PRN

## 2017-11-25 MED ORDER — TRAMADOL HCL 50 MG PO TABS: 50.0000 mg | ORAL_TABLET | Freq: Four times a day (QID) | ORAL | 0 refills | Status: DC | PRN

## 2017-11-25 MED ORDER — OXYCODONE HCL 5 MG PO TABS: 5.0000 mg | ORAL_TABLET | Freq: Four times a day (QID) | ORAL | 0 refills | Status: DC | PRN

## 2017-11-25 MED ORDER — WARFARIN SODIUM 6 MG PO TABS
6.0000 mg | ORAL_TABLET | Freq: Once | ORAL | Status: AC
  Administered 2017-11-25: 6 mg via ORAL
  Filled 2017-11-25: qty 1

## 2017-11-25 NOTE — Progress Notes (Signed)
[place] for Warfarin  Indication: history of atrial fibrillation, VTE prophylaxis post-operatively   Allergies  Allergen Reactions  . Amlodipine Swelling  . Cephalexin Other (See Comments)    unknown  . Codeine Other (See Comments)    unknown  . Metoclopramide Hcl Other (See Comments)    unknown  . Penicillins Other (See Comments)    Told my MD that she was allergic     Patient Measurements: Height: 5\' 5"  (165.1 cm) Weight: 138 lb 14.2 oz (63 kg) IBW/kg (Calculated) : 57  Vital Signs: Temp: 97.8 F (36.6 C) (11/19 0610) Temp Source: Oral (11/19 0610) BP: 127/55 (11/19 0610) Pulse Rate: 54 (11/19 0610)  Labs: Recent Labs    11/25/17 0457  HGB 10.7*  HCT 34.5*  PLT 193  LABPROT 14.2  INR 1.11  CREATININE 0.64    Estimated Creatinine Clearance: 46.3 mL/min (by C-G formula based on SCr of 0.64 mg/dL).   Medical History: Past Medical History:  Diagnosis Date  . Arthritis    right  . Atrial fibrillation ([place])   . GERD (gastroesophageal reflux disease)    hx of  . Hepatitis C   . Humerus fracture   . Hyperlipidemia   . Hypertension   . Irritable bowel syndrome   . Meningioma ([place])   . PONV (postoperative nausea and vomiting)     Assessment: 27 y/oF with PMH of a-fib, TIA on chronic warfarin therapy who underwent L TKA today. Pharmacy asked to resume warfarin post-operatively. PTA Warfarin dose reported as 3mg  on M/W/F and 6mg  on all other days of the week, with last dose on 11/19/17. Was bridged with enoxaparin 60mg  SQ q12h for procedure, last dose on 11/23/17 at 1900. Last INR on 11/17/17 = 1.89. CBC from 11/17/17 WNL.  Today, 11/25/17  Hgb 10.7, plt 193. Hgb dropped from pre-op level but pt is post-op  INR 1.11, subtherapeutic  No listed bleeding issues    Goal of Therapy:  INR 2-3 Monitor platelets by anticoagulation protocol: Yes   Plan:   Warfarin 6 mg PO x 1  Enoxaparin 40mg  SQ q24h to  start on 11/25/17 at 0800 per MD. D/C when INR =/> 2.   Daily PT/INR  Monitor CBC and for s/sx of bleeding.     Royetta Asal, PharmD, BCPS Pager [phone] 11/25/2017 9:01 AM

## 2017-11-25 NOTE — Progress Notes (Signed)
Physical Therapy Treatment Patient Details Name: Gloria [name] MRN: [id number] DOB: February 16, 1930 Today's Date: 11/25/2017    History of Present Illness 84 yo female s/p L TKR on 11/24/17. PMH includes TIA, HTN, afib, IBS, hep C, HLD, meningioma, THA, R TKR, ORIF for periprosthetic fracture.     PT Comments    Patient progressing with ambulation with much encouragement.  She is concerned about going home as previously had gone to SNF following surgery.  States daughter not there all the time.  Fatigues quickly with activity and pain limits participation as well.  PT to follow acutely.   Follow Up Recommendations  Follow surgeon's recommendation for DC plan and follow-up therapies;Supervision for mobility/OOB     Equipment Recommendations  None recommended by PT    Recommendations for Other Services       Precautions / Restrictions Precautions Precautions: Fall Required Braces or Orthoses: Knee Immobilizer - Left Knee Immobilizer - Left: On when out of bed or walking;Discontinue once straight leg raise with < 10 degree lag Restrictions Other Position/Activity Restrictions: WBAT     Mobility  Bed Mobility Overal bed mobility: Needs Assistance Bed Mobility: Sit to Supine     Supine to sit: HOB elevated;Min guard Sit to supine: Supervision   General bed mobility comments: for balance  Transfers Overall transfer level: Needs assistance Equipment used: Rolling walker (2 wheeled) Transfers: Sit to/from Stand Sit to Stand: Min guard         General transfer comment: for safety  Ambulation/Gait Ambulation/Gait assistance: Min guard Gait Distance (Feet): 140 Feet Assistive device: Rolling walker (2 wheeled) Gait Pattern/deviations: Step-to pattern;Decreased stride length;Step-through pattern;Antalgic     General Gait Details: increased time and fatigued but encouraged to walk much of one end of unit   Stairs             Wheelchair Mobility    Modified  Rankin (Stroke Patients Only)       Balance Overall balance assessment: Needs assistance   Sitting balance-Leahy Scale: Good     Standing balance support: Bilateral upper extremity supported Standing balance-Leahy Scale: Poor Standing balance comment: walker for support                            Cognition Arousal/Alertness: Awake/alert Behavior During Therapy: WFL for tasks assessed/performed Overall Cognitive Status: Within Functional Limits for tasks assessed                                        Exercises Total Joint Exercises Ankle Circles/Pumps: AROM;Both;10 reps;Supine Quad Sets: AROM;10 reps;Supine;Left Short Arc Quad: AROM;10 reps;Left;Supine Heel Slides: AROM;10 reps;Left;Supine Hip ABduction/ADduction: AROM;10 reps;Left;Supine Straight Leg Raises: AAROM;10 reps;Left;Supine;AROM Goniometric ROM: 10-63    General Comments General comments (skin integrity, edema, etc.): reports concerned may not have enough help as daughter only there part of the time and she went to [facility] after previous surgeries.       Pertinent Vitals/Pain Pain Assessment: Faces Pain Score: 7  Faces Pain Scale: Hurts even more Pain Location: L knee  Pain Descriptors / Indicators: Grimacing;Guarding;Discomfort Pain Intervention(s): Repositioned;Monitored during session    Home Living                      Prior Function            PT Goals (current goals can  now be found in the care plan section) Progress towards PT goals: Progressing toward goals    Frequency    7X/week      PT Plan Current plan remains appropriate    Co-evaluation              AM-PAC PT "6 Clicks" Daily Activity  Outcome Measure  Difficulty turning over in bed (including adjusting bedclothes, sheets and blankets)?: A Lot Difficulty moving from lying on back to sitting on the side of the bed? : A Lot Difficulty sitting down on and standing up from a chair with  arms (e.g., wheelchair, bedside commode, etc,.)?: Unable Help needed moving to and from a bed to chair (including a wheelchair)?: A Little Help needed walking in hospital room?: A Little Help needed climbing 3-5 steps with a railing? : A Little 6 Click Score: 14    End of Session Equipment Utilized During Treatment: Gait belt Activity Tolerance: Patient limited by fatigue Patient left: in CPM;in bed;with bed alarm set   PT Visit Diagnosis: Difficulty in walking, not elsewhere classified (R26.2);Pain Pain - Right/Left: Left Pain - part of body: Knee     Time: 1435-1510 PT Time Calculation (min) (ACUTE ONLY): 35 min  Charges:  $Gait Training: 8-22 mins $Therapeutic Activity: 8-22 mins                     Magda Kiel, [place] [facility] [phone] 11/25/2017    Reginia Naas 11/25/2017, 4:58 PM

## 2017-11-25 NOTE — Progress Notes (Signed)
   Subjective: 1 Day Post-Op Procedure(s) (LRB): TOTAL KNEE ARTHROPLASTY (Left) Patient reports pain as moderate.   Patient seen in rounds by Dr. Wynelle Link. Patient is well, and has had no acute complaints or problems other than pain in the left knee. No issues overnight. Foley catheter removed this AM. Denies chest pain, SOB, or calf pain. We will continue therapy today.   Objective: Vital signs in last 24 hours: Temp:  [97.4 F (36.3 C)-98.3 F (36.8 C)] 97.8 F (36.6 C) (11/19 0610) Pulse Rate:  [47-87] 54 (11/19 0610) Resp:  [12-24] 16 (11/19 0213) BP: (127-173)/(55-85) 127/55 (11/19 0610) SpO2:  [95 %-100 %] 100 % (11/19 0610) Weight:  [63 kg] 63 kg (11/18 1104)  Intake/Output from previous day:  Intake/Output Summary (Last 24 hours) at 11/25/2017 0758 Last data filed at 11/25/2017 0600 Gross per 24 hour  Intake 3845.53 ml  Output 1020 ml  Net 2825.53 ml    Labs: Recent Labs    11/25/17 0457  HGB 10.7*   Recent Labs    11/25/17 0457  WBC 12.1*  RBC 3.66*  HCT 34.5*  PLT 193   Recent Labs    11/25/17 0457  NA 140  K 3.8  CL 105  CO2 26  BUN 13  CREATININE 0.64  GLUCOSE 134*  CALCIUM 8.9   Recent Labs    11/25/17 0457  INR 1.11    Exam: General - Patient is Alert and Oriented Extremity - Neurologically intact Neurovascular intact Sensation intact distally Dorsiflexion/Plantar flexion intact Dressing - dressing C/D/I Motor Function - intact, moving foot and toes well on exam.   Past Medical History:  Diagnosis Date  . Arthritis    right  . Atrial fibrillation ([place])   . GERD (gastroesophageal reflux disease)    hx of  . Hepatitis C   . Humerus fracture   . Hyperlipidemia   . Hypertension   . Irritable bowel syndrome   . Meningioma ([place])   . PONV (postoperative nausea and vomiting)     Assessment/Plan: 1 Day Post-Op Procedure(s) (LRB): TOTAL KNEE ARTHROPLASTY (Left) Principal Problem:   OA (osteoarthritis) of knee  Estimated  body mass index is 23.11 kg/m as calculated from the following:   Height as of this encounter: 5\' 5"  (1.651 m).   Weight as of this encounter: 63 kg. Advance diet Up with therapy  Anticipated LOS equal to or greater than 2 midnights due to - Age 82 and older with one or more of the following:  - Obesity  - Expected need for hospital services (PT, OT, Nursing) required for safe  discharge  - Anticipated need for postoperative skilled nursing care or inpatient rehab  - Active co-morbidities: Cardiac Arrhythmia OR   - Unanticipated findings during/Post Surgery: None  - Patient is a high risk of re-admission due to: None    DVT Prophylaxis - Lovenox and Coumadin Weight bearing as tolerated. D/C O2 and pulse ox and try on room air. Hemovac pulled without difficulty, will continue therapy today.  Plan is to go Home after hospital stay. Plan for discharge tomorrow.  Theresa Duty, PA-C Orthopedic Surgery 11/25/2017, 7:58 AM

## 2017-11-25 NOTE — Plan of Care (Signed)
Pt stable with no needs at time of assessment. No changes to note. No s/s of distress. No changes in pt overall condition.

## 2017-11-25 NOTE — Progress Notes (Signed)
Physical Therapy Treatment Patient Details Name: Gloria [name] MRN: [id number] DOB: 1930-06-14 Today's Date: 11/25/2017    History of Present Illness 84 yo female s/p L TKR on 11/24/17. PMH includes TIA, HTN, afib, IBS, hep C, HLD, meningioma, THA, R TKR, ORIF for periprosthetic fracture.     PT Comments    Patient progressing with ambulation in hallway this session.  Remains painful and limited ambulation distance due to pain.  Feel she will need follow up PT at d/c and capable 24 hour assist.    Follow Up Recommendations  Follow surgeon's recommendation for DC plan and follow-up therapies;Supervision for mobility/OOB     Equipment Recommendations  None recommended by PT    Recommendations for Other Services       Precautions / Restrictions Precautions Precautions: Fall Required Braces or Orthoses: Knee Immobilizer - Left Knee Immobilizer - Left: On when out of bed or walking;Discontinue once straight leg raise with < 10 degree lag Restrictions Other Position/Activity Restrictions: WBAT     Mobility  Bed Mobility   Bed Mobility: Supine to Sit     Supine to sit: HOB elevated;Min guard     General bed mobility comments: for balance  Transfers Overall transfer level: Needs assistance Equipment used: Rolling walker (2 wheeled) Transfers: Sit to/from Stand Sit to Stand: Min assist         General transfer comment: cues for technique, assist for safety/balance  Ambulation/Gait Ambulation/Gait assistance: Min assist Gait Distance (Feet): 65 Feet Assistive device: Rolling walker (2 wheeled) Gait Pattern/deviations: Step-to pattern;Decreased stride length;Antalgic     General Gait Details: limited by pain, cues for sequence   Stairs             Wheelchair Mobility    Modified Rankin (Stroke Patients Only)       Balance Overall balance assessment: Needs assistance   Sitting balance-Leahy Scale: Good     Standing balance support: Bilateral  upper extremity supported Standing balance-Leahy Scale: Poor                              Cognition Arousal/Alertness: Awake/alert Behavior During Therapy: WFL for tasks assessed/performed Overall Cognitive Status: Within Functional Limits for tasks assessed                                        Exercises Total Joint Exercises Ankle Circles/Pumps: AROM;Both;10 reps;Supine Quad Sets: AROM;10 reps;Supine;Left Short Arc Quad: AROM;10 reps;Left;Supine Heel Slides: AROM;10 reps;Left;Supine Hip ABduction/ADduction: AROM;10 reps;Left;Supine Straight Leg Raises: AAROM;10 reps;Left;Supine;AROM Goniometric ROM: 10-63    General Comments        Pertinent Vitals/Pain Pain Score: 7  Pain Location: L knee  Pain Descriptors / Indicators: Grimacing;Guarding;Discomfort Pain Intervention(s): Repositioned;Monitored during session;RN gave pain meds during session;Ice applied    Home Living                      Prior Function            PT Goals (current goals can now be found in the care plan section) Progress towards PT goals: Progressing toward goals    Frequency    7X/week      PT Plan Current plan remains appropriate    Co-evaluation              AM-PAC PT "6 Clicks" Daily Activity  Outcome Measure  Difficulty turning over in bed (including adjusting bedclothes, sheets and blankets)?: A Lot Difficulty moving from lying on back to sitting on the side of the bed? : Unable Difficulty sitting down on and standing up from a chair with arms (e.g., wheelchair, bedside commode, etc,.)?: Unable Help needed moving to and from a bed to chair (including a wheelchair)?: A Little Help needed walking in hospital room?: A Little Help needed climbing 3-5 steps with a railing? : A Little 6 Click Score: 13    End of Session Equipment Utilized During Treatment: Gait belt Activity Tolerance: Patient limited by pain Patient left: with call  bell/phone within reach;in chair   PT Visit Diagnosis: Difficulty in walking, not elsewhere classified (R26.2);Pain Pain - Right/Left: Left Pain - part of body: Knee     Time: 6153-7943 PT Time Calculation (min) (ACUTE ONLY): 37 min  Charges:  $Gait Training: 8-22 mins $Therapeutic Exercise: 8-22 mins                     Magda Kiel, [place] [facility] [phone] 11/25/2017    Reginia Naas 11/25/2017, 2:31 PM

## 2017-11-26 LAB — BASIC METABOLIC PANEL
Anion gap: 6 (ref 5–15)
BUN: 11 mg/dL (ref 8–23)
CO2: 27 mmol/L (ref 22–32)
Calcium: 8.8 mg/dL — ABNORMAL LOW (ref 8.9–10.3)
Chloride: 104 mmol/L (ref 98–111)
Creatinine, Ser: 0.56 mg/dL (ref 0.44–1.00)
GFR calc Af Amer: >60 mL/min (ref >60)
GFR calc non Af Amer: >60 mL/min (ref >60)
Glucose, Bld: 103 mg/dL — ABNORMAL HIGH (ref 70–99)
Potassium: 3.4 mmol/L — ABNORMAL LOW (ref 3.5–5.1)
Sodium: 137 mmol/L (ref 135–145)

## 2017-11-26 LAB — CBC
HCT: 32.1 % — ABNORMAL LOW (ref 36.0–46.0)
Hemoglobin: 10 g/dL — ABNORMAL LOW (ref 12.0–15.0)
MCH: 30.1 pg (ref 26.0–34.0)
MCHC: 31.2 g/dL (ref 30.0–36.0)
MCV: 96.7 fL (ref 80.0–100.0)
Platelets: 179 10*3/uL (ref 150–400)
RBC: 3.32 MIL/uL — ABNORMAL LOW (ref 3.87–5.11)
RDW: 13.6 % (ref 11.5–15.5)
WBC: 12.1 10*3/uL — ABNORMAL HIGH (ref 4.0–10.5)
nRBC: 0 % (ref 0.0–0.2)

## 2017-11-26 LAB — PROTIME-INR
INR: 1.37
Prothrombin Time: 16.7 seconds — ABNORMAL HIGH (ref 11.4–15.2)

## 2017-11-26 MED ORDER — POTASSIUM CHLORIDE CRYS ER 20 MEQ PO TBCR
40.0000 meq | EXTENDED_RELEASE_TABLET | Freq: Once | ORAL | Status: AC
  Administered 2017-11-26: 40 meq via ORAL
  Filled 2017-11-26: qty 2

## 2017-11-26 MED ORDER — WARFARIN SODIUM 6 MG PO TABS
6.0000 mg | ORAL_TABLET | Freq: Once | ORAL | Status: DC
  Filled 2017-11-26: qty 1

## 2017-11-26 NOTE — Progress Notes (Signed)
[place] for Warfarin  Indication: history of atrial fibrillation, VTE prophylaxis post-operatively   Allergies  Allergen Reactions  . Amlodipine Swelling  . Cephalexin Other (See Comments)    unknown  . Codeine Other (See Comments)    unknown  . Metoclopramide Hcl Other (See Comments)    unknown  . Penicillins Other (See Comments)    Told my MD that she was allergic     Patient Measurements: Height: 5\' 5"  (165.1 cm) Weight: 138 lb 14.2 oz (63 kg) IBW/kg (Calculated) : 57  Vital Signs: Temp: 99.1 F (37.3 C) (11/20 1309) Temp Source: Oral (11/20 1309) BP: 138/49 (11/20 1309) Pulse Rate: 72 (11/20 1309)  Labs: Recent Labs    11/25/17 0457 11/26/17 0529  HGB 10.7* 10.0*  HCT 34.5* 32.1*  PLT 193 179  LABPROT 14.2 16.7*  INR 1.11 1.37  CREATININE 0.64 0.56    Estimated Creatinine Clearance: 46.3 mL/min (by C-G formula based on SCr of 0.56 mg/dL).   Medical History: Past Medical History:  Diagnosis Date  . Arthritis    right  . Atrial fibrillation ([place])   . GERD (gastroesophageal reflux disease)    hx of  . Hepatitis C   . Humerus fracture   . Hyperlipidemia   . Hypertension   . Irritable bowel syndrome   . Meningioma ([place])   . PONV (postoperative nausea and vomiting)     Assessment: 66 y/oF with PMH of a-fib, TIA on chronic warfarin therapy who underwent L TKA today. Pharmacy asked to resume warfarin post-operatively. PTA Warfarin dose reported as 3mg  on M/W/F and 6mg  on all other days of the week, with last dose on 11/19/17. Was bridged with enoxaparin 60mg  SQ q12h for procedure, last dose on 11/23/17 at 1900. Last INR on 11/17/17 = 1.89. CBC from 11/17/17 WNL.  Today, 11/26/17  Hgb 10., plt 179. Hgb dropped from pre-op level but pt is post-op  INR 1.37, subtherapeutic  No listed bleeding issues    Goal of Therapy:  INR 2-3 Monitor platelets by anticoagulation protocol: Yes   Plan:    Warfarin 6 mg PO x 1  Enoxaparin 40mg  SQ q24h to started on 11/25/17 at 0800 per MD. D/C when INR =/> 2.   Daily PT/INR  Monitor CBC and for s/sx of bleeding.     Dolly Rias RPh 11/26/2017, 1:57 PM Pager [phone]

## 2017-11-26 NOTE — Progress Notes (Signed)
Pt continues to have anxiety and moderate pain with movement just to bsc. Pt reports feeling warm and anxious with activity. Rn encouraged pt that the pain is normal and expected. Rn continuing to medicate for pain. Will continue to monitor.

## 2017-11-26 NOTE — Progress Notes (Signed)
Physical Therapy Treatment Patient Details Name: Gloria [name] MRN: [id number] DOB: 02-02-1930 Today's Date: 11/26/2017    History of Present Illness 84 yo female s/p L TKR on 11/24/17. PMH includes TIA, HTN, afib, IBS, hep C, HLD, meningioma, THA, R TKR, ORIF for periprosthetic fracture.     PT Comments    Pt with continued L knee pain, this session reaching 5/10 with ambulation. Pt also with continued anxiety. Pt performed stair navigation, gait, and LE exercises proficiently this session. PT reviewed pt's exercise handout with her, pt states no further questions. Pt to d/c this afternoon.    Follow Up Recommendations  Follow surgeon's recommendation for DC plan and follow-up therapies;Supervision for mobility/OOB     Equipment Recommendations  None recommended by PT    Recommendations for Other Services       Precautions / Restrictions Precautions Precautions: Fall Required Braces or Orthoses: (KI discontinued due to pt performing SLR with no quad lag without assist x5) Restrictions Weight Bearing Restrictions: No Other Position/Activity Restrictions: WBAT     Mobility  Bed Mobility Overal bed mobility: Needs Assistance Bed Mobility: Supine to Sit     Supine to sit: HOB elevated;Min guard     General bed mobility comments: pt up in chair upon arrival to room.   Transfers Overall transfer level: Needs assistance Equipment used: Rolling walker (2 wheeled) Transfers: Sit to/from Stand Sit to Stand: Supervision         General transfer comment: wheeled pt in recliner to rehab gym to practice stairs. Supervision for safety with sit to stand once in gym. Increased time and effort.   Ambulation/Gait Ambulation/Gait assistance: Min guard;Supervision Gait Distance (Feet): 150 Feet Assistive device: Rolling walker (2 wheeled) Gait Pattern/deviations: Step-to pattern;Decreased stride length;Antalgic;Decreased stance time - left;Decreased weight shift to left Gait  velocity: decr    General Gait Details: increased time to perform. Verbal cuing x1 for placement in RW. Continued antalgic gait.    Stairs Stairs: Yes Stairs assistance: Min guard Stair Management: One rail Left;With cane Number of Stairs: 6(2x3 stairs) General stair comments: verbal cuing for sequencing. Min guard for safety. Verbal reinforcement for placement of cane.    Wheelchair Mobility    Modified Rankin (Stroke Patients Only)       Balance Overall balance assessment: Needs assistance   Sitting balance-Leahy Scale: Good     Standing balance support: Bilateral upper extremity supported Standing balance-Leahy Scale: Poor Standing balance comment: walker for support                            Cognition Arousal/Alertness: Awake/alert Behavior During Therapy: Anxious Overall Cognitive Status: Within Functional Limits for tasks assessed                                 General Comments: Pt with rapid breathing, frequent sighing, jerking/tremors       Exercises Total Joint Exercises Ankle Circles/Pumps: AROM;Both;20 reps;Supine Quad Sets: AROM;10 reps;Supine;Left Towel Squeeze: AROM;Both;10 reps;Seated Short Arc Quad: AROM;10 reps;Left;Supine Heel Slides: AAROM;Left;10 reps;Supine Hip ABduction/ADduction: AROM;10 reps;Left;Seated Straight Leg Raises: AAROM;Left;5 reps;Supine Knee Flexion: AAROM;Left;10 reps;Seated Goniometric ROM: knee aarom ~10-70*, stiffer at start of session     General Comments General comments (skin integrity, edema, etc.): Pt reporting this session that she feels she should not d/c home, but she will.       Pertinent Vitals/Pain Pain Assessment: 0-10  Pain Score: 5  Pain Location: L knee  Pain Descriptors / Indicators: Grimacing;Guarding;Sore Pain Intervention(s): Limited activity within patient's tolerance;Monitored during session;Repositioned    Home Living                      Prior Function             PT Goals (current goals can now be found in the care plan section) Acute Rehab PT Goals PT Goal Formulation: With patient Time For Goal Achievement: 12/01/17 Potential to Achieve Goals: Good Progress towards PT goals: Progressing toward goals    Frequency    7X/week      PT Plan Current plan remains appropriate    Co-evaluation              AM-PAC PT "6 Clicks" Daily Activity  Outcome Measure  Difficulty turning over in bed (including adjusting bedclothes, sheets and blankets)?: A Little Difficulty moving from lying on back to sitting on the side of the bed? : A Little Difficulty sitting down on and standing up from a chair with arms (e.g., wheelchair, bedside commode, etc,.)?: Unable Help needed moving to and from a bed to chair (including a wheelchair)?: A Little Help needed walking in hospital room?: A Little Help needed climbing 3-5 steps with a railing? : A Little 6 Click Score: 16    End of Session Equipment Utilized During Treatment: Gait belt Activity Tolerance: Patient tolerated treatment well Patient left: with call bell/phone within reach;in bed;with bed alarm set Nurse Communication: Mobility status;Patient requests pain meds PT Visit Diagnosis: Difficulty in walking, not elsewhere classified (R26.2);Pain Pain - Right/Left: Left Pain - part of body: Knee     Time: 8882-8003 PT Time Calculation (min) (ACUTE ONLY): 27 min  Charges:  $Gait Training: 8-22 mins $Therapeutic Activity: 8-22 mins                     Julien Girt, PT Acute Rehabilitation Services Pager [phone]  Office [phone]    Marqus Macphee D Elonda Husky 11/26/2017, 4:46 PM

## 2017-11-26 NOTE — Plan of Care (Signed)
  Problem: Health Behavior/Discharge Planning: Goal: Ability to manage health-related needs will improve Outcome: Progressing   Problem: Clinical Measurements: Goal: Ability to maintain clinical measurements within normal limits will improve Outcome: Progressing   Problem: Clinical Measurements: Goal: Will remain free from infection Outcome: Progressing   Problem: Clinical Measurements: Goal: Diagnostic test results will improve Outcome: Progressing   Problem: Clinical Measurements: Goal: Respiratory complications will improve Outcome: Progressing   Problem: Clinical Measurements: Goal: Cardiovascular complication will be avoided Outcome: Progressing   Problem: Activity: Goal: Risk for activity intolerance will decrease Outcome: Progressing   Problem: Clinical Measurements: Goal: Cardiovascular complication will be avoided Outcome: Progressing   Problem: Activity: Goal: Risk for activity intolerance will decrease Outcome: Progressing   Problem: Nutrition: Goal: Adequate nutrition will be maintained Outcome: Progressing   Problem: Elimination: Goal: Will not experience complications related to bowel motility Outcome: Progressing   Problem: Elimination: Goal: Will not experience complications related to bowel motility Outcome: Progressing

## 2017-11-26 NOTE — Progress Notes (Addendum)
   Subjective: 2 Days Post-Op Procedure(s) (LRB): TOTAL KNEE ARTHROPLASTY (Left) Patient reports pain as mild.   Patient seen in rounds for Dr. Wynelle Link. Patient reports having problems with anxiety when attempting to transfer from bed to bedside commode. She did very well with therapy yesterday. Voiding without difficulty and positive flatus. Denies chest pain, SOB, or calf pain. Plan is to go Home after hospital stay.  Objective: Vital signs in last 24 hours: Temp:  [97.6 F (36.4 C)-98.3 F (36.8 C)] 97.8 F (36.6 C) (11/20 0505) Pulse Rate:  [57-60] 60 (11/20 0505) Resp:  [16-18] 16 (11/20 0505) BP: (118-143)/(45-67) 143/59 (11/20 0505) SpO2:  [96 %-98 %] 97 % (11/20 0505)  Intake/Output from previous day:  Intake/Output Summary (Last 24 hours) at 11/26/2017 0745 Last data filed at 11/26/2017 0551 Gross per 24 hour  Intake 1607.44 ml  Output 1775 ml  Net -167.56 ml    Intake/Output this shift: No intake/output data recorded.  Labs: Recent Labs    11/25/17 0457 11/26/17 0529  HGB 10.7* 10.0*   Recent Labs    11/25/17 0457 11/26/17 0529  WBC 12.1* 12.1*  RBC 3.66* 3.32*  HCT 34.5* 32.1*  PLT 193 179   Recent Labs    11/25/17 0457 11/26/17 0529  NA 140 137  K 3.8 3.4*  CL 105 104  CO2 26 27  BUN 13 11  CREATININE 0.64 0.56  GLUCOSE 134* 103*  CALCIUM 8.9 8.8*   Recent Labs    11/25/17 0457 11/26/17 0529  INR 1.11 1.37    Exam: General - Patient is Alert and Oriented Extremity - Neurologically intact Neurovascular intact Sensation intact distally Dorsiflexion/Plantar flexion intact Dressing/Incision - clean, dry, no drainage Motor Function - intact, moving foot and toes well on exam.   Past Medical History:  Diagnosis Date  . Arthritis    right  . Atrial fibrillation ([place])   . GERD (gastroesophageal reflux disease)    hx of  . Hepatitis C   . Humerus fracture   . Hyperlipidemia   . Hypertension   . Irritable bowel syndrome   .  Meningioma ([place])   . PONV (postoperative nausea and vomiting)     Assessment/Plan: 2 Days Post-Op Procedure(s) (LRB): TOTAL KNEE ARTHROPLASTY (Left) Principal Problem:   OA (osteoarthritis) of knee  Estimated body mass index is 23.11 kg/m as calculated from the following:   Height as of this encounter: 5\' 5"  (1.651 m).   Weight as of this encounter: 63 kg. Up with therapy D/C IV fluids  DVT Prophylaxis - Lovenox and Coumadin Weight-bearing as tolerated  Potassium 3.4 this AM, one dose of 40 mEq KCL ordered.  Discussed issues with patient regarding anxiety, she will work with therapy for two sessions today to continue building her confidence. States she is ready to go home. Possible discharge to home today pending progress. Scheduled for outpatient physical therapy at [facility]. Follow-up in the office in 2 weeks.   Theresa Duty, PA-C Orthopedic Surgery 11/26/2017, 7:45 AM

## 2017-11-26 NOTE — Progress Notes (Signed)
Physical Therapy Treatment Patient Details Name: Gloria [name] MRN: [id number] DOB: 09/27/1930 Today's Date: 11/26/2017    History of Present Illness 84 yo female s/p L TKR on 11/24/17. PMH includes TIA, HTN, afib, IBS, hep C, HLD, meningioma, THA, R TKR, ORIF for periprosthetic fracture.     PT Comments    Pt with improved ambulation distance, but still is walking with step-to gait and is limited in WB on LLE due to pain. Pt anxious this session, evidenced by tachypnea, hesitations with mobility, and discussion with PT that pt is nervous to d/c home today. Pt went SNF after last TKR, and pt has hesitations about going home. PT to continue to follow acutely.    Follow Up Recommendations  Follow surgeon's recommendation for DC plan and follow-up therapies;Supervision for mobility/OOB     Equipment Recommendations  None recommended by PT    Recommendations for Other Services       Precautions / Restrictions Precautions Precautions: Fall Required Braces or Orthoses: (KI discontinued due to pt performing SLR with no quad lag without assist x5) Restrictions Weight Bearing Restrictions: No Other Position/Activity Restrictions: WBAT     Mobility  Bed Mobility Overal bed mobility: Needs Assistance Bed Mobility: Supine to Sit     Supine to sit: HOB elevated;Min guard     General bed mobility comments: Min guard for safety. Verbal cuing for sequencing, increased time and effort. Pt anxious about knee flexing.   Transfers Overall transfer level: Needs assistance Equipment used: Rolling walker (2 wheeled) Transfers: Sit to/from Stand Sit to Stand: Min guard;From elevated surface         General transfer comment: Min guard for safety. Verbal cuing for hand placement.   Ambulation/Gait Ambulation/Gait assistance: Min guard Gait Distance (Feet): 150 Feet Assistive device: Rolling walker (2 wheeled) Gait Pattern/deviations: Step-to pattern;Decreased stride  length;Antalgic;Decreased stance time - left;Decreased weight shift to left Gait velocity: decr   General Gait Details: increased time to perform. Verbal cuing for placement in RW, sequencing. Pt lacking knee flexion/extension during ambulation, attempted to instruct pt in heel-toe ambulation with step-through gait but pt too painful and anxious.   Stairs             Wheelchair Mobility    Modified Rankin (Stroke Patients Only)       Balance Overall balance assessment: Needs assistance   Sitting balance-Leahy Scale: Good     Standing balance support: Bilateral upper extremity supported Standing balance-Leahy Scale: Poor Standing balance comment: walker for support                            Cognition Arousal/Alertness: Awake/alert Behavior During Therapy: Anxious Overall Cognitive Status: Within Functional Limits for tasks assessed                                 General Comments: Pt with rapid breathing, frequent sighing, jerking/tremors       Exercises Total Joint Exercises Ankle Circles/Pumps: AROM;Both;20 reps;Supine Quad Sets: AROM;10 reps;Supine;Left Heel Slides: AAROM;Left;10 reps;Supine Straight Leg Raises: AAROM;Left;5 reps;Supine Knee Flexion: AAROM;Left;10 reps;Seated Goniometric ROM: knee aarom ~10-70*, stiffer at start of session     General Comments General comments (skin integrity, edema, etc.): Pt reporting this session that she feels she should not d/c home, but she will.       Pertinent Vitals/Pain Pain Assessment: 0-10 Pain Score: 4  Pain Location:  L knee  Pain Descriptors / Indicators: Grimacing;Guarding;Sore Pain Intervention(s): Limited activity within patient's tolerance;Repositioned;Monitored during session;Ice applied;Premedicated before session    Home Living                      Prior Function            PT Goals (current goals can now be found in the care plan section) Acute Rehab PT  Goals PT Goal Formulation: With patient Time For Goal Achievement: 12/01/17 Potential to Achieve Goals: Good Progress towards PT goals: Progressing toward goals    Frequency    7X/week      PT Plan Current plan remains appropriate    Co-evaluation              AM-PAC PT "6 Clicks" Daily Activity  Outcome Measure  Difficulty turning over in bed (including adjusting bedclothes, sheets and blankets)?: A Little Difficulty moving from lying on back to sitting on the side of the bed? : A Little Difficulty sitting down on and standing up from a chair with arms (e.g., wheelchair, bedside commode, etc,.)?: Unable Help needed moving to and from a bed to chair (including a wheelchair)?: A Little Help needed walking in hospital room?: A Little Help needed climbing 3-5 steps with a railing? : A Little 6 Click Score: 16    End of Session Equipment Utilized During Treatment: Gait belt Activity Tolerance: Patient limited by fatigue Patient left: in chair;with call bell/phone within reach Nurse Communication: Mobility status PT Visit Diagnosis: Difficulty in walking, not elsewhere classified (R26.2);Pain Pain - Right/Left: Left Pain - part of body: Knee     Time: 2863-8177 PT Time Calculation (min) (ACUTE ONLY): 30 min  Charges:  $Gait Training: 8-22 mins $Therapeutic Exercise: 8-22 mins                     Julien Girt, PT Acute Rehabilitation Services Pager [phone]  Office [phone]    Kynadie Yaun D Anne-Marie Genson 11/26/2017, 2:19 PM

## 2017-11-27 ENCOUNTER — Encounter (HOSPITAL_COMMUNITY): Payer: Self-pay | Admitting: Orthopedic Surgery

## 2017-11-28 ENCOUNTER — Encounter: Payer: Self-pay | Admitting: Physical Therapy

## 2017-11-28 ENCOUNTER — Other Ambulatory Visit: Payer: Self-pay

## 2017-11-28 ENCOUNTER — Ambulatory Visit: Payer: Medicare Other | Attending: Orthopedic Surgery | Admitting: Physical Therapy

## 2017-11-28 DIAGNOSIS — M25662 Stiffness of left knee, not elsewhere classified: Secondary | ICD-10-CM | POA: Diagnosis not present

## 2017-11-28 DIAGNOSIS — M25562 Pain in left knee: Secondary | ICD-10-CM | POA: Insufficient documentation

## 2017-11-28 DIAGNOSIS — R262 Difficulty in walking, not elsewhere classified: Secondary | ICD-10-CM | POA: Diagnosis not present

## 2017-11-28 DIAGNOSIS — Z96652 Presence of left artificial knee joint: Secondary | ICD-10-CM | POA: Insufficient documentation

## 2017-11-28 NOTE — Therapy (Signed)
[place], [place], [address] Phone: [phone]   Fax:  [phone]  Physical Therapy Evaluation  Patient Details  Name: Gloria [name] MRN: [id number] Date of Birth: [birth year] Referring Provider (PT): Dr. Maureen Ralphs   Encounter Date: 11/28/2017  PT End of Session - 11/28/17 1054    Visit Number  1    Number of Visits  16    Date for PT Re-Evaluation  01/23/18    PT Start Time  6962    PT Stop Time  1100    PT Time Calculation (min)  45 min    Activity Tolerance  Patient tolerated treatment well    Behavior During Therapy  [facility] for tasks assessed/performed       Past Medical History:  Diagnosis Date  . Arthritis    right  . Atrial fibrillation ([place])   . GERD (gastroesophageal reflux disease)    hx of  . Hepatitis C   . Humerus fracture   . Hyperlipidemia   . Hypertension   . Irritable bowel syndrome   . Meningioma ([place])   . PONV (postoperative nausea and vomiting)     Past Surgical History:  Procedure Laterality Date  . BREAST SURGERY     lumpectomy  . CARDIOVASCULAR STRESS TEST  11/14/2006   EF 67%  . COLONOSCOPY W/ BIOPSIES AND POLYPECTOMY    . ORIF HUMERUS FRACTURE Right 04/18/2014   Procedure: OPEN REDUCTION INTERNAL FIXATION (ORIF) RIGHT PROXIMAL HUMERUS FRACTURE;  Surgeon: Netta Cedars, MD;  Location: [place];  Service: Orthopedics;  Laterality: Right;  . ORIF PERIPROSTHETIC FRACTURE Left 04/30/2013   Procedure: OPEN REDUCTION INTERNAL FIXATION (ORIF) PERIPROSTHETIC FRACTURE;  Surgeon: [name], MD;  Location: [place];  Service: Orthopedics;  Laterality: Left;  . PAROTID GLAND TUMOR EXCISION    . TOTAL HIP ARTHROPLASTY  11/07   left  . TOTAL KNEE ARTHROPLASTY    . TOTAL KNEE ARTHROPLASTY Left 11/24/2017   Procedure: TOTAL KNEE ARTHROPLASTY;  Surgeon: Gaynelle Arabian, MD;  Location: [facility];  Service: Orthopedics;  Laterality: Left;  62min  . TRANSTHORACIC ECHOCARDIOGRAM  10/25/2009    EF 55-60%    There were no vitals filed for this visit.   Subjective Assessment - 11/28/17 1021    Subjective  Patient underwent L TKA earlier this week. She presents with stiffness, swelling, pain today.  Her daugher is with her.  They are requesting Home Health PT.  She has had her Rt knee replaced and reports going to the nursing home last time.  She is slightly nauseous.     Patient is accompained by:  Family member    Pertinent History  L shoulder ORIF , Rt hip ORIF, L THA, Rt TKA, Atrial fibrillation, on coumadin     Limitations  Sitting;Lifting;Standing;House hold activities;Other (comment);Walking    How long can you stand comfortably?  not comfortable     How long can you walk comfortably?  not comfortable    Patient Stated Goals  Walk better, be independent , less pain     Currently in Pain?  Yes    Pain Score  8     Pain Location  Knee    Pain Orientation  Left    Pain Descriptors / Indicators  Aching    Pain Type  Surgical pain    Pain Onset  In the past 7 days    Pain Frequency  Constant    Aggravating Factors   walking,bending it  Pain Relieving Factors  ice, prop it up, RICE     Effect of Pain on Daily Activities  limits her mobility          [facility] PT Assessment - 11/28/17 0001      Assessment   Medical Diagnosis  L TKA     Referring Provider (PT)  Dr. Maureen Ralphs    Onset Date/Surgical Date  11/24/17    Prior Therapy  Yes, for Rt knee , no HHA       Precautions   Precautions  Other (comment)    Precaution Comments  general fall risk for frail older adult    Coumadin      Restrictions   Weight Bearing Restrictions  No      Balance Screen   Has the patient fallen in the past 6 months  Yes   July 2019    How many times?  1   significant decline in mobility post, fell in garage    Has the patient had a decrease in activity level because of a fear of falling?   Yes    Is the patient reluctant to leave their home because of a fear of falling?   Yes       [place] residence    Living Arrangements  Alone    Available Help at Discharge  Family    Type of [place] to enter    Entrance Stairs-Number of Steps  2    Wing  One level    [place] - 2 wheels;Bedside commode;Shower seat      Prior Function   Level of Independence  Requires assistive device for independence;Needs assistance with ADLs;Needs assistance with homemaking;Needs assistance with gait;Needs assistance with transfers    Vocation  Retired    Leisure  visiting family       Cognition   Overall Cognitive Status  Within Functional Limits for tasks assessed      Observation/Other Assessments   Observations  fatigued    Focus on Therapeutic Outcomes (FOTO)   NT on eval       Sensation   Light Touch  Appears Intact      Posture/Postural Control   Posture/Postural Control  Postural limitations    Postural Limitations  Rounded Shoulders;Forward head;Increased thoracic kyphosis    Posture Comments  decreased knee flexion with gait       AROM   Left Knee Extension  -15    Left Knee Flexion  45      PROM   Overall PROM Comments  limited by pain , 60 deg EOB      Strength   Overall Strength Comments  Lt knee extension 3+/5  , Lt knee flexion 4/5 PAIN with both       Palpation   Patella mobility  painful, hypomobile     Palpation comment  skin is tight and hard due to swelling, extensive bruising up the thigh and into the medial >Lateral knee.  limited quad contraction       Transfers   Transfers  Sit to [facility]    Sit to Stand  4: Min guard    Stand Pivot Transfers  5: Supervision      Ambulation/Gait   Ambulation Distance (Feet)  150 Feet    Assistive device  Rolling walker    Gait  Pattern  Step-to pattern    Ambulation Surface  Level;Indoor    Gait Comments  decreased gait velocity                 Objective  measurements completed on examination: See above findings.      [facility] Adult PT Treatment/Exercise - 11/28/17 0001      Self-Care   Self-Care  Retrograde Massage;Heat/Ice Application;Other Self-Care Comments    Retrograde Massage  supine     Heat/Ice Application  RICE     Other Self-Care Comments   HEP, coordination of care       Knee/Hip Exercises: Stretches   Knee: Self-Stretch to increase Flexion  Left;5 reps      Knee/Hip Exercises: Seated   Long Arc Quad  AAROM;Strengthening;Left;1 set;10 reps      Knee/Hip Exercises: Supine   Quad Sets  AAROM;Left;1 set    Other Supine Knee/Hip Exercises  ankle pump x 20 bilateral       Modalities   Modalities  Cryotherapy      Cryotherapy   Number Minutes Cryotherapy  10 Minutes    Cryotherapy Location  Knee    Type of Cryotherapy  Ice pack      Manual Therapy   Manual Therapy  Edema management;Passive ROM    Edema Management  sweeping, showed how to do at home     Passive ROM  seated              PT Education - 11/28/17 1210    Education Details  PT,POC, HEP level 1 knee, elevation, sweep the thigh for swelling, bruising, ice , gradual consistent ROM.ex     Person(s) Educated  Patient;Child(ren)    Methods  Explanation;Demonstration;Verbal cues;Tactile cues;Handout    Comprehension  Need further instruction;Returned demonstration;Verbalized understanding       PT Short Term Goals - 11/28/17 1211      PT SHORT TERM GOAL #1   Title  Patient will be I with HEP for L knee ROM, swelling, strength     Time  4    Period  Weeks    Status  New    Target Date  12/26/17      PT SHORT TERM GOAL #2   Title  Pt will be able to flex her L knee to 90 deg with AAROM working towards normal gait, transfers     Time  4    Period  Weeks    Status  New    Target Date  12/26/17      PT SHORT TERM GOAL #3   Title  Pt will be able to walk 300 feet with improved knee flexion, pain controlled, min increase in pain from baseline.      Time  4    Period  Weeks    Status  New    Target Date  12/26/17      PT SHORT TERM GOAL #4   Title  Pt will be mod I with stair negotiation (has 2 STE) with 1 rail    Time  4    Period  Weeks    Status  New    Target Date  12/26/17      PT SHORT TERM GOAL #5   Title  Pt will be screened for balance and goal TBA     Time  4    Period  Weeks    Status  New    Target Date  12/26/17  PT Long Term Goals - 11/28/17 1217      PT LONG TERM GOAL #1   Title  Pt will be I with her HEP upon discharge     Time  8    Period  Weeks    Status  New    Target Date  01/23/18      PT LONG TERM GOAL #2   Title  Pt will demo 4+/5 or more strength in knees, hips for improved gait stability and function in her home.     Time  8    Period  Weeks    Status  New    Target Date  01/23/18      PT LONG TERM GOAL #3   Title  pt will be able to extend her knee to -8 deg or better for improved gait    Time  8    Period  Weeks    Status  New    Target Date  01/23/18      PT LONG TERM GOAL #4   Title  Pt will be able to sit with knee flexed to 100 deg for normal transfers     Time  8    Period  Weeks    Status  New    Target Date  01/23/18      PT LONG TERM GOAL #5   Title  Pt will be able to walk in the community with RW > 500 feet without lasting increase in pain.     Time  8    Period  Weeks    Status  New    Target Date  01/23/18             Plan - 11/28/17 1223    Clinical Impression Statement  Pt presents for mod complexity eval of L TKA which went as planned 11/24/17. Overall she is doing well, but understandably fatigued.  Her pain was severe today but typical for what it has been this week.  She does have a great deal of bruising which she and her daughter report did not worsen until this morning as they removed an ice pack.  MD as called and photo sent.  The office reported Dr Maureen Ralphs thought this as normal given anticoagulation.  He also feels OPPT would benefit her  more at this  time despite my request as the patient's advocate.  They were agreeable to POC.      History and Personal Factors relevant to plan of care:  multiple orthopedic surgeries, fall risk, lives alone, coumadin, reliance on daughter for care and transport    Clinical Presentation  Evolving    Clinical Presentation due to:  increase in bruising this AM    Clinical Decision Making  Moderate    Rehab Potential  Excellent    PT Frequency  2x / week    PT Duration  8 weeks    PT Treatment/Interventions  ADLs/Self Care Home Management;Functional mobility training;Taping;Vasopneumatic Device;Passive range of motion;Manual lymph drainage;Manual techniques;Patient/family education;Balance training;Therapeutic exercise;Therapeutic activities;Traction;Cryotherapy;Ultrasound;Gait training;Stair training;DME Instruction    PT Next Visit Plan  level 1 knee, gentle, ROM, RICE, manage edema    PT Home Exercise Plan  level 1 knee     Consulted and Agree with Plan of Care  Patient       Patient will benefit from skilled therapeutic intervention in order to improve the following deficits and impairments:  Abnormal gait, Decreased balance, Decreased endurance, Decreased mobility, Decreased skin integrity, Difficulty  walking, Hypomobility, Increased edema, Decreased scar mobility, Decreased range of motion, Decreased activity tolerance, Decreased safety awareness, Decreased strength, Increased fascial restricitons, Impaired flexibility, Impaired UE functional use, Postural dysfunction, Pain  Visit Diagnosis: Status post total left knee replacement  Stiffness of left knee, not elsewhere classified  Acute pain of left knee  Difficulty in walking, not elsewhere classified     Problem List Patient Active Problem List   Diagnosis Date Noted  . OA (osteoarthritis) of knee 11/24/2017  . TIA (transient ischemic attack) 07/14/2016  . Nonintractable headache   . Warfarin anticoagulation 04/09/2011     Class: Chronic  . Hypertension   . Irritable bowel syndrome   . Atrial fibrillation [facility])     PAA,JENNIFER 11/28/2017, 12:30 PM  [place], [place], [address] Phone: [phone]   Fax:  [phone]  Name: WESLEIGH MARKOVIC MRN: [id number] Date of Birth: 12-Dec-1930   Raeford Razor, PT 11/28/17 12:31 PM Phone: [phone] Fax: [phone]

## 2017-11-28 NOTE — Patient Instructions (Signed)
Has from MD office level 1 knee post op

## 2017-12-01 NOTE — Discharge Summary (Signed)
Physician Discharge Summary   Patient ID: Gloria [name] MRN: [id number] DOB/AGE: [birth year] 82 y.o.  Admit date: 11/24/2017 Discharge date: 11/26/2017  Primary Diagnosis: Osteoarthritis, left knee   Admission Diagnoses:  Past Medical History:  Diagnosis Date  . Arthritis    right  . Atrial fibrillation ([place])   . GERD (gastroesophageal reflux disease)    hx of  . Hepatitis C   . Humerus fracture   . Hyperlipidemia   . Hypertension   . Irritable bowel syndrome   . Meningioma ([place])   . PONV (postoperative nausea and vomiting)    Discharge Diagnoses:   Principal Problem:   OA (osteoarthritis) of knee  Estimated body mass index is 23.11 kg/m as calculated from the following:   Height as of this encounter: _0  (1.651 m).   Weight as of this encounter: 63 kg.  Procedure:  Procedure(s) (LRB): TOTAL KNEE ARTHROPLASTY (Left)   Consults: None  HPI: Gloria [name] is a 84 y.o. year old female with end stage OA of her left knee with progressively worsening pain and dysfunction. She has constant pain, with activity and at rest and significant functional deficits with difficulties even with ADLs. She has had extensive non-op management including analgesics, injections of cortisone and viscosupplements, and home exercise program, but remains in significant pain with significant dysfunction. Radiographs show bone on bone arthritis lateral and patellofemoral. She presents now for left Total Knee Arthroplasty.    Laboratory Data: Admission on 11/24/2017, Discharged on 11/26/2017  Component Date Value Ref Range Status  . WBC 11/25/2017 12.1* 4.0 - 10.5 K/uL Final  . RBC 11/25/2017 3.66* 3.87 - 5.11 MIL/uL Final  . Hemoglobin 11/25/2017 10.7* 12.0 - 15.0 g/dL Final  . HCT 11/25/2017 34.5* 36.0 - 46.0 % Final  . MCV 11/25/2017 94.3  80.0 - 100.0 fL Final  . MCH 11/25/2017 29.2  26.0 - 34.0 pg Final  . MCHC 11/25/2017 31.0  30.0 - 36.0 g/dL Final  . RDW 11/25/2017 13.3  11.5 -  15.5 % Final  . Platelets 11/25/2017 193  150 - 400 K/uL Final  . nRBC 11/25/2017 0.0  0.0 - 0.2 % Final   Performed at [facility], [place] [address], [place], [place] [address]  . Sodium 11/25/2017 140  135 - 145 mmol/L Final  . Potassium 11/25/2017 3.8  3.5 - 5.1 mmol/L Final  . Chloride 11/25/2017 105  98 - 111 mmol/L Final  . CO2 11/25/2017 26  22 - 32 mmol/L Final  . Glucose, Bld 11/25/2017 134* 70 - 99 mg/dL Final  . BUN 11/25/2017 13  8 - 23 mg/dL Final  . Creatinine, Ser 11/25/2017 0.64  0.44 - 1.00 mg/dL Final  . Calcium 11/25/2017 8.9  8.9 - 10.3 mg/dL Final  . GFR calc non Af Amer 11/25/2017 >60  >60 mL/min Final  . GFR calc Af Amer 11/25/2017 >60  >60 mL/min Final   Comment: (NOTE) The eGFR has been calculated using the CKD EPI equation. This calculation has not been validated in all clinical situations. eGFR's persistently <60 mL/min signify possible Chronic Kidney Disease.   Georgiann Hahn gap 11/25/2017 9  5 - 15 Final   Performed at [facility], [place] [address], [place], [place] [address]  . Prothrombin Time 11/25/2017 14.2  11.4 - 15.2 seconds Final  . INR 11/25/2017 1.11   Final   Performed at [facility], [place] [address] [place], [place] [address]  . Glucose-Capillary 11/24/2017  150* 70 - 99 mg/dL Final  . WBC 11/26/2017 12.1* 4.0 - 10.5 K/uL Final  . RBC 11/26/2017 3.32* 3.87 - 5.11 MIL/uL Final  . Hemoglobin 11/26/2017 10.0* 12.0 - 15.0 g/dL Final  . HCT 11/26/2017 32.1* 36.0 - 46.0 % Final  . MCV 11/26/2017 96.7  80.0 - 100.0 fL Final  . MCH 11/26/2017 30.1  26.0 - 34.0 pg Final  . MCHC 11/26/2017 31.2  30.0 - 36.0 g/dL Final  . RDW 11/26/2017 13.6  11.5 - 15.5 % Final  . Platelets 11/26/2017 179  150 - 400 K/uL Final  . nRBC 11/26/2017 0.0  0.0 - 0.2 % Final   Performed at [facility], [place] [address] [place], [place] [address]  . Sodium 11/26/2017 137  135 - 145 mmol/L Final  .  Potassium 11/26/2017 3.4* 3.5 - 5.1 mmol/L Final  . Chloride 11/26/2017 104  98 - 111 mmol/L Final  . CO2 11/26/2017 27  22 - 32 mmol/L Final  . Glucose, Bld 11/26/2017 103* 70 - 99 mg/dL Final  . BUN 11/26/2017 11  8 - 23 mg/dL Final  . Creatinine, Ser 11/26/2017 0.56  0.44 - 1.00 mg/dL Final  . Calcium 11/26/2017 8.8* 8.9 - 10.3 mg/dL Final  . GFR calc non Af Amer 11/26/2017 >60  >60 mL/min Final  . GFR calc Af Amer 11/26/2017 >60  >60 mL/min Final   Comment: (NOTE) The eGFR has been calculated using the CKD EPI equation. This calculation has not been validated in all clinical situations. eGFR's persistently <60 mL/min signify possible Chronic Kidney Disease.   Georgiann Hahn gap 11/26/2017 6  5 - 15 Final   Performed at [facility], [place] [address], [place], [place] [address]  . Prothrombin Time 11/26/2017 16.7* 11.4 - 15.2 seconds Final  . INR 11/26/2017 1.37   Final   Performed at [facility], [place] [address], [place], [place] [address]  Hospital Outpatient Visit on 11/17/2017  Component Date Value Ref Range Status  . aPTT 11/17/2017 32  24 - 36 seconds Final   Performed at [facility], [place] [address] [place], [place] [address]  . WBC 11/17/2017 6.0  4.0 - 10.5 K/uL Final  . RBC 11/17/2017 4.85  3.87 - 5.11 MIL/uL Final  . Hemoglobin 11/17/2017 14.1  12.0 - 15.0 g/dL Final  . HCT 11/17/2017 45.4  36.0 - 46.0 % Final  . MCV 11/17/2017 93.6  80.0 - 100.0 fL Final  . MCH 11/17/2017 29.1  26.0 - 34.0 pg Final  . MCHC 11/17/2017 31.1  30.0 - 36.0 g/dL Final  . RDW 11/17/2017 13.3  11.5 - 15.5 % Final  . Platelets 11/17/2017 236  150 - 400 K/uL Final  . nRBC 11/17/2017 0.0  0.0 - 0.2 % Final   Performed at [facility], [place] [address] [place], [place] [address]  . Sodium 11/17/2017 140  135 - 145 mmol/L Final  . Potassium 11/17/2017 3.7  3.5 - 5.1 mmol/L Final  . Chloride 11/17/2017 103  98 - 111 mmol/L Final    . CO2 11/17/2017 27  22 - 32 mmol/L Final  . Glucose, Bld 11/17/2017 100* 70 - 99 mg/dL Final  . BUN 11/17/2017 16  8 - 23 mg/dL Final  . Creatinine, Ser 11/17/2017 0.79  0.44 - 1.00 mg/dL Final  . Calcium 11/17/2017 9.6  8.9 - 10.3 mg/dL Final  . Total Protein 11/17/2017 7.2  6.5 - 8.1 g/dL  Final  . Albumin 11/17/2017 4.5  3.5 - 5.0 g/dL Final  . AST 11/17/2017 22  15 - 41 U/L Final  . ALT 11/17/2017 16  0 - 44 U/L Final  . Alkaline Phosphatase 11/17/2017 148* 38 - 126 U/L Final  . Total Bilirubin 11/17/2017 1.1  0.3 - 1.2 mg/dL Final  . GFR calc non Af Amer 11/17/2017 >60  >60 mL/min Final  . GFR calc Af Amer 11/17/2017 >60  >60 mL/min Final   Comment: (NOTE) The eGFR has been calculated using the CKD EPI equation. This calculation has not been validated in all clinical situations. eGFR's persistently <60 mL/min signify possible Chronic Kidney Disease.   Georgiann Hahn gap 11/17/2017 10  5 - 15 Final   Performed at [facility], [place] [address], [place], [place] [address]  . Prothrombin Time 11/17/2017 21.5* 11.4 - 15.2 seconds Final  . INR 11/17/2017 1.89   Final   Performed at [facility], [place] [address] [place], [place] [address]  . ABO/RH(D) 11/17/2017 O POS   Final  . Antibody Screen 11/17/2017 NEG   Final  . Sample Expiration 11/17/2017 11/27/2017   Final  . Extend sample reason 11/17/2017    Final                   Value:NO TRANSFUSIONS OR PREGNANCY IN THE PAST 3 MONTHS Performed at [facility], [place] [address] [place], [place] [address]   . MRSA, PCR 11/17/2017 NEGATIVE  NEGATIVE Final  . Staphylococcus aureus 11/17/2017 NEGATIVE  NEGATIVE Final   Comment: (NOTE) The Xpert SA Assay (FDA approved for NASAL specimens in patients 42 years of age and older), is one component of a comprehensive surveillance program. It is not intended to diagnose infection nor to guide or monitor treatment. Performed at [facility], [place] [address], [place], [place] [address]      X-Rays:No results found.  EKG: Orders placed or performed in visit on 11/03/17  . EKG 12-Lead     Hospital Course: Gloria [name] is a 84 y.o. who was admitted to [facility]. They were brought to the operating room on 11/24/2017 and underwent Procedure(s): TOTAL KNEE ARTHROPLASTY.  Patient tolerated the procedure well and was later transferred to the recovery room and then to the orthopaedic floor for postoperative care. They were given PO and IV analgesics for pain control following their surgery. They were given 24 hours of postoperative antibiotics of  Anti-infectives (From admission, onward)   Start     Dose/Rate Route Frequency Ordered Stop   11/25/17 0015  vancomycin (VANCOCIN) IVPB 1000 mg/200 mL premix     1,000 mg 200 mL/hr over 60 Minutes Intravenous Every 12 hours 11/24/17 1444 11/25/17 0214   11/24/17 1030  vancomycin (VANCOCIN) IVPB 1000 mg/200 mL premix     1,000 mg 200 mL/hr over 60 Minutes Intravenous On call to O.R. 11/24/17 1018 11/24/17 1212     and started on DVT prophylaxis in the form of Lovenox and Coumadin.   PT and OT were ordered for total joint protocol. Discharge planning consulted to help with postop disposition and equipment needs. Patient had a decent night on the evening of surgery. They started to get up OOB with therapy on POD #0. Hemovac drain was pulled without difficulty on day one. Continued to work with therapy into POD #2. Pt was seen during rounds on day two and was ready to go home  pending progress with therapy. Dressing was changed and the incision was clean, dry, and intact with no drainage. Potassium was noted to be low at 3.4, one dose of 40 mEq KCl ordered.Pt worked with therapy for two additional sessions and was meeting their goals. She was discharged to home later that day in stable condition.  Diet: Cardiac diet Activity: WBAT Follow-up: in 2 weeks with Dr.  Wynelle Link Disposition: Home with outpatient PT at [facility] Discharged Condition: stable   Discharge Instructions    Call MD / Call 911   Complete by:  As directed    If you experience chest pain or shortness of breath, CALL 911 and be transported to the hospital emergency room.  If you develope a fever above 101 F, pus (white drainage) or increased drainage or redness at the wound, or calf pain, call your surgeon's office.   Change dressing   Complete by:  As directed    Change the dressing daily with sterile 4 x 4 inch gauze dressing and apply TED hose.   Constipation Prevention   Complete by:  As directed    Drink plenty of fluids.  Prune juice may be helpful.  You may use a stool softener, such as Colace (over the counter) 100 mg twice a day.  Use MiraLax (over the counter) for constipation as needed.   Diet - low sodium heart healthy   Complete by:  As directed    Discharge instructions   Complete by:  As directed    Dr. Gaynelle Arabian Total Joint Specialist [facility] [address] [place], [place], [place] [address] [phone]  TOTAL KNEE REPLACEMENT POSTOPERATIVE DIRECTIONS  Knee Rehabilitation, Guidelines Following Surgery  Results after knee surgery are often greatly improved when you follow the exercise, range of motion and muscle strengthening exercises prescribed by your doctor. Safety measures are also important to protect the knee from further injury. Any time any of these exercises cause you to have increased pain or swelling in your knee joint, decrease the amount until you are comfortable again and slowly increase them. If you have problems or questions, call your caregiver or physical therapist for advice.   HOME CARE INSTRUCTIONS  Remove items at home which could result in a fall. This includes throw rugs or furniture in walking pathways.  ICE to the affected knee every three hours for 30 minutes at a time and then as needed for pain and swelling.   Continue to use ice on the knee for pain and swelling from surgery. You may notice swelling that will progress down to the foot and ankle.  This is normal after surgery.  Elevate the leg when you are not up walking on it.   Continue to use the breathing machine which will help keep your temperature down.  It is common for your temperature to cycle up and down following surgery, especially at night when you are not up moving around and exerting yourself.  The breathing machine keeps your lungs expanded and your temperature down. Do not place pillow under knee, focus on keeping the knee straight while resting   DIET You may resume your previous home diet once your are discharged from the hospital.  DRESSING / WOUND CARE / SHOWERING You may shower 3 days after surgery, but keep the wounds dry during showering.  You may use an occlusive plastic wrap (Press'n Seal for example), NO SOAKING/SUBMERGING IN THE BATHTUB.  If the bandage gets wet, change with a  clean dry gauze.  If the incision gets wet, pat the wound dry with a clean towel. You may start showering once you are discharged home but do not submerge the incision under water. Just pat the incision dry and apply a dry gauze dressing on daily. Change the surgical dressing daily and reapply a dry dressing each time.  ACTIVITY Walk with your walker as instructed. Use walker as long as suggested by your caregivers. Avoid periods of inactivity such as sitting longer than an hour when not asleep. This helps prevent blood clots.  You may resume a sexual relationship in one month or when given the OK by your doctor.  You may return to work once you are cleared by your doctor.  Do not drive a car for 6 weeks or until released by you surgeon.  Do not drive while taking narcotics.  WEIGHT BEARING Weight bearing as tolerated with assist device (walker, cane, etc) as directed, use it as long as suggested by your surgeon or therapist, typically at least 4-6  weeks.  POSTOPERATIVE CONSTIPATION PROTOCOL Constipation - defined medically as fewer than three stools per week and severe constipation as less than one stool per week.  One of the most common issues patients have following surgery is constipation.  Even if you have a regular bowel pattern at home, your normal regimen is likely to be disrupted due to multiple reasons following surgery.  Combination of anesthesia, postoperative narcotics, change in appetite and fluid intake all can affect your bowels.  In order to avoid complications following surgery, here are some recommendations in order to help you during your recovery period.  Colace (docusate) - Pick up an over-the-counter form of Colace or another stool softener and take twice a day as long as you are requiring postoperative pain medications.  Take with a full glass of water daily.  If you experience loose stools or diarrhea, hold the colace until you stool forms back up.  If your symptoms do not get better within 1 week or if they get worse, check with your doctor.  Dulcolax (bisacodyl) - Pick up over-the-counter and take as directed by the product packaging as needed to assist with the movement of your bowels.  Take with a full glass of water.  Use this product as needed if not relieved by Colace only.   MiraLax (polyethylene glycol) - Pick up over-the-counter to have on hand.  MiraLax is a solution that will increase the amount of water in your bowels to assist with bowel movements.  Take as directed and can mix with a glass of water, juice, soda, coffee, or tea.  Take if you go more than two days without a movement. Do not use MiraLax more than once per day. Call your doctor if you are still constipated or irregular after using this medication for 7 days in a row.  If you continue to have problems with postoperative constipation, please contact the office for further assistance and recommendations.  If you experience "the worst abdominal  pain ever" or develop nausea or vomiting, please contact the office immediatly for further recommendations for treatment.  ITCHING  If you experience itching with your medications, try taking only a single pain pill, or even half a pain pill at a time.  You can also use Benadryl over the counter for itching or also to help with sleep.   TED HOSE STOCKINGS Wear the elastic stockings on both legs for three weeks following surgery during the day but  you may remove then at night for sleeping.  MEDICATIONS See your medication summary on the "After Visit Summary" that the nursing staff will review with you prior to discharge.  You may have some home medications which will be placed on hold until you complete the course of blood thinner medication.  It is important for you to complete the blood thinner medication as prescribed by your surgeon.  Continue your approved medications as instructed at time of discharge.  PRECAUTIONS If you experience chest pain or shortness of breath - call 911 immediately for transfer to the hospital emergency department.  If you develop a fever greater that 101 F, purulent drainage from wound, increased redness or drainage from wound, foul odor from the wound/dressing, or calf pain - CONTACT YOUR SURGEON.                                                   FOLLOW-UP APPOINTMENTS Make sure you keep all of your appointments after your operation with your surgeon and caregivers. You should call the office at the above phone number and make an appointment for approximately two weeks after the date of your surgery or on the date instructed by your surgeon outlined in the "After Visit Summary".   RANGE OF MOTION AND STRENGTHENING EXERCISES  Rehabilitation of the knee is important following a knee injury or an operation. After just a few days of immobilization, the muscles of the thigh which control the knee become weakened and shrink (atrophy). Knee exercises are designed to build  up the tone and strength of the thigh muscles and to improve knee motion. Often times heat used for twenty to thirty minutes before working out will loosen up your tissues and help with improving the range of motion but do not use heat for the first two weeks following surgery. These exercises can be done on a training (exercise) mat, on the floor, on a table or on a bed. Use what ever works the best and is most comfortable for you Knee exercises include:  Leg Lifts - While your knee is still immobilized in a splint or cast, you can do straight leg raises. Lift the leg to 60 degrees, hold for 3 sec, and slowly lower the leg. Repeat 10-20 times 2-3 times daily. Perform this exercise against resistance later as your knee gets better.  Quad and Hamstring Sets - Tighten up the muscle on the front of the thigh (Quad) and hold for 5-10 sec. Repeat this 10-20 times hourly. Hamstring sets are done by pushing the foot backward against an object and holding for 5-10 sec. Repeat as with quad sets.  Leg Slides: Lying on your back, slowly slide your foot toward your buttocks, bending your knee up off the floor (only go as far as is comfortable). Then slowly slide your foot back down until your leg is flat on the floor again. Angel Wings: Lying on your back spread your legs to the side as far apart as you can without causing discomfort.  A rehabilitation program following serious knee injuries can speed recovery and prevent re-injury in the future due to weakened muscles. Contact your doctor or a physical therapist for more information on knee rehabilitation.   IF YOU ARE TRANSFERRED TO A SKILLED REHAB FACILITY If the patient is transferred to a skilled rehab facility following release  from the hospital, a list of the current medications will be sent to the facility for the patient to continue.  When discharged from the skilled rehab facility, please have the facility set up the patient's [place]  prior to being released. Also, the skilled facility will be responsible for providing the patient with their medications at time of release from the facility to include their pain medication, the muscle relaxants, and their blood thinner medication. If the patient is still at the rehab facility at time of the two week follow up appointment, the skilled rehab facility will also need to assist the patient in arranging follow up appointment in our office and any transportation needs.  MAKE SURE YOU:  Understand these instructions.  Get help right away if you are not doing well or get worse.    Pick up stool softner and laxative for home use following surgery while on pain medications. Do not submerge incision under water. Please use good hand washing techniques while changing dressing each day. May shower starting three days after surgery. Please use a clean towel to pat the incision dry following showers. Continue to use ice for pain and swelling after surgery. Do not use any lotions or creams on the incision until instructed by your surgeon.   Do not put a pillow under the knee. Place it under the heel.   Complete by:  As directed    Driving restrictions   Complete by:  As directed    No driving for two weeks   TED hose   Complete by:  As directed    Use stockings (TED hose) for three weeks on both leg(s).  You may remove them at night for sleeping.   Weight bearing as tolerated   Complete by:  As directed      Allergies as of 11/26/2017      Reactions   Amlodipine Swelling   Cephalexin Other (See Comments)   unknown   Codeine Other (See Comments)   unknown   Metoclopramide Hcl Other (See Comments)   unknown   Penicillins Other (See Comments)   Told my MD that she was allergic       Medication List    TAKE these medications   acetaminophen 500 MG tablet Commonly known as:  TYLENOL Take 500 mg by mouth every 6 (six) hours as needed for mild pain or headache.   atorvastatin  10 MG tablet Commonly known as:  LIPITOR Take 10 mg by mouth at bedtime.   docusate sodium 50 MG capsule Commonly known as:  COLACE Take 50 mg by mouth daily as needed for mild constipation.   enoxaparin 40 MG/0.4ML injection Commonly known as:  LOVENOX Inject 60 mg into the skin every 12 (twelve) hours.   enoxaparin 60 MG/0.6ML injection Commonly known as:  LOVENOX Inject 60 mg into the skin every 12 (twelve) hours.   ergocalciferol 1.25 MG (50000 UT) capsule Commonly known as:  VITAMIN D2 Take 50,000 Units by mouth every Friday.   flecainide 50 MG tablet Commonly known as:  TAMBOCOR Take 1 tablet (50 mg total) by mouth 2 (two) times daily.   hydrochlorothiazide 25 MG tablet Commonly known as:  HYDRODIURIL TAKE 1 TABLET BY MOUTH ONCE DAILY   losartan 25 MG tablet Commonly known as:  COZAAR TAKE 1 TABLET BY MOUTH ONCE DAILY What changed:  when to take this   methocarbamol 500 MG tablet Commonly known as:  ROBAXIN Take 1 tablet (500 mg total)  by mouth every 6 (six) hours as needed for muscle spasms.   nitroGLYCERIN 0.4 MG SL tablet Commonly known as:  NITROSTAT Place 1 tablet (0.4 mg total) under the tongue every 5 (five) minutes as needed for chest pain.   omeprazole 20 MG capsule Commonly known as:  PRILOSEC Take 20 mg by mouth 2 (two) times daily.   oxyCODONE 5 MG immediate release tablet Commonly known as:  Oxy IR/ROXICODONE Take 1-2 tablets (5-10 mg total) by mouth every 6 (six) hours as needed for severe pain.   polyethylene glycol packet Commonly known as:  MIRALAX / GLYCOLAX Take 17 g by mouth daily as needed for moderate constipation.   PRESERVISION AREDS PO Take 1 capsule by mouth 2 (two) times daily.   traMADol 50 MG tablet Commonly known as:  ULTRAM Take 1-2 tablets (50-100 mg total) by mouth every 6 (six) hours as needed for moderate pain.   vitamin B-12 1000 MCG tablet Commonly known as:  CYANOCOBALAMIN Take 1,000 mcg by mouth every  evening.   warfarin 6 MG tablet Commonly known as:  COUMADIN Take 3-6 tablets by mouth See admin instructions. Take 3 mg by mouth daily on Monday, Wednesday and Friday. Take 6 mg by mouth daily on all other days.            Discharge Care Instructions  (From admission, onward)         Start     Ordered   11/25/17 0000  Weight bearing as tolerated     11/25/17 0801   11/25/17 0000  Change dressing    Comments:  Change the dressing daily with sterile 4 x 4 inch gauze dressing and apply TED hose.   11/25/17 0801         Follow-up Information    Gaynelle Arabian, MD. Go on 12/09/2017.   Specialty:  Orthopedic Surgery Why:  You are scheduled for a post-op appointment with Dr. Wynelle Link on 12-09-17 at 1:30 pm. Contact information: [address] [place] [address] [address] [phone]        [facility] Follow up on 11/28/2017.   Why:  You are scheduled for a physical therapy appointment at [facility] on 11-28-17 at 10:15 am.          Signed: Theresa Duty, PA-C Orthopedic Surgery 12/01/2017, 3:33 PM

## 2017-12-02 ENCOUNTER — Ambulatory Visit: Payer: Medicare Other | Admitting: Physical Therapy

## 2017-12-03 DIAGNOSIS — Z7902 Long term (current) use of antithrombotics/antiplatelets: Secondary | ICD-10-CM | POA: Diagnosis not present

## 2017-12-03 DIAGNOSIS — Z6823 Body mass index (BMI) 23.0-23.9, adult: Secondary | ICD-10-CM | POA: Diagnosis not present

## 2017-12-03 DIAGNOSIS — K219 Gastro-esophageal reflux disease without esophagitis: Secondary | ICD-10-CM | POA: Diagnosis not present

## 2017-12-03 DIAGNOSIS — Z7901 Long term (current) use of anticoagulants: Secondary | ICD-10-CM | POA: Diagnosis not present

## 2017-12-03 DIAGNOSIS — I10 Essential (primary) hypertension: Secondary | ICD-10-CM | POA: Diagnosis not present

## 2017-12-03 DIAGNOSIS — I480 Paroxysmal atrial fibrillation: Secondary | ICD-10-CM | POA: Diagnosis not present

## 2017-12-03 DIAGNOSIS — Z96652 Presence of left artificial knee joint: Secondary | ICD-10-CM | POA: Diagnosis not present

## 2017-12-06 DIAGNOSIS — Z96653 Presence of artificial knee joint, bilateral: Secondary | ICD-10-CM | POA: Diagnosis not present

## 2017-12-06 DIAGNOSIS — Z7901 Long term (current) use of anticoagulants: Secondary | ICD-10-CM | POA: Diagnosis not present

## 2017-12-06 DIAGNOSIS — I4891 Unspecified atrial fibrillation: Secondary | ICD-10-CM | POA: Diagnosis not present

## 2017-12-06 DIAGNOSIS — D329 Benign neoplasm of meninges, unspecified: Secondary | ICD-10-CM | POA: Diagnosis not present

## 2017-12-06 DIAGNOSIS — K589 Irritable bowel syndrome without diarrhea: Secondary | ICD-10-CM | POA: Diagnosis not present

## 2017-12-06 DIAGNOSIS — Z8673 Personal history of transient ischemic attack (TIA), and cerebral infarction without residual deficits: Secondary | ICD-10-CM | POA: Diagnosis not present

## 2017-12-06 DIAGNOSIS — B1920 Unspecified viral hepatitis C without hepatic coma: Secondary | ICD-10-CM | POA: Diagnosis not present

## 2017-12-06 DIAGNOSIS — Z471 Aftercare following joint replacement surgery: Secondary | ICD-10-CM | POA: Diagnosis not present

## 2017-12-06 DIAGNOSIS — I10 Essential (primary) hypertension: Secondary | ICD-10-CM | POA: Diagnosis not present

## 2017-12-06 DIAGNOSIS — E785 Hyperlipidemia, unspecified: Secondary | ICD-10-CM | POA: Diagnosis not present

## 2017-12-06 DIAGNOSIS — K219 Gastro-esophageal reflux disease without esophagitis: Secondary | ICD-10-CM | POA: Diagnosis not present

## 2017-12-08 DIAGNOSIS — Z471 Aftercare following joint replacement surgery: Secondary | ICD-10-CM | POA: Diagnosis not present

## 2017-12-08 DIAGNOSIS — I4891 Unspecified atrial fibrillation: Secondary | ICD-10-CM | POA: Diagnosis not present

## 2017-12-08 DIAGNOSIS — D329 Benign neoplasm of meninges, unspecified: Secondary | ICD-10-CM | POA: Diagnosis not present

## 2017-12-08 DIAGNOSIS — I10 Essential (primary) hypertension: Secondary | ICD-10-CM | POA: Diagnosis not present

## 2017-12-08 DIAGNOSIS — K219 Gastro-esophageal reflux disease without esophagitis: Secondary | ICD-10-CM | POA: Diagnosis not present

## 2017-12-08 DIAGNOSIS — K589 Irritable bowel syndrome without diarrhea: Secondary | ICD-10-CM | POA: Diagnosis not present

## 2017-12-10 DIAGNOSIS — K589 Irritable bowel syndrome without diarrhea: Secondary | ICD-10-CM | POA: Diagnosis not present

## 2017-12-10 DIAGNOSIS — Z471 Aftercare following joint replacement surgery: Secondary | ICD-10-CM | POA: Diagnosis not present

## 2017-12-10 DIAGNOSIS — I10 Essential (primary) hypertension: Secondary | ICD-10-CM | POA: Diagnosis not present

## 2017-12-10 DIAGNOSIS — I4891 Unspecified atrial fibrillation: Secondary | ICD-10-CM | POA: Diagnosis not present

## 2017-12-10 DIAGNOSIS — K219 Gastro-esophageal reflux disease without esophagitis: Secondary | ICD-10-CM | POA: Diagnosis not present

## 2017-12-10 DIAGNOSIS — D329 Benign neoplasm of meninges, unspecified: Secondary | ICD-10-CM | POA: Diagnosis not present

## 2017-12-11 DIAGNOSIS — K219 Gastro-esophageal reflux disease without esophagitis: Secondary | ICD-10-CM | POA: Diagnosis not present

## 2017-12-11 DIAGNOSIS — I4891 Unspecified atrial fibrillation: Secondary | ICD-10-CM | POA: Diagnosis not present

## 2017-12-11 DIAGNOSIS — I10 Essential (primary) hypertension: Secondary | ICD-10-CM | POA: Diagnosis not present

## 2017-12-11 DIAGNOSIS — Z471 Aftercare following joint replacement surgery: Secondary | ICD-10-CM | POA: Diagnosis not present

## 2017-12-11 DIAGNOSIS — D329 Benign neoplasm of meninges, unspecified: Secondary | ICD-10-CM | POA: Diagnosis not present

## 2017-12-11 DIAGNOSIS — K589 Irritable bowel syndrome without diarrhea: Secondary | ICD-10-CM | POA: Diagnosis not present

## 2017-12-12 ENCOUNTER — Encounter

## 2017-12-12 ENCOUNTER — Encounter (HOSPITAL_COMMUNITY): Payer: Self-pay

## 2017-12-12 ENCOUNTER — Emergency Department (HOSPITAL_COMMUNITY): Payer: Medicare Other

## 2017-12-12 ENCOUNTER — Other Ambulatory Visit: Payer: Self-pay

## 2017-12-12 ENCOUNTER — Emergency Department (HOSPITAL_COMMUNITY)
Admission: EM | Admit: 2017-12-12 | Discharge: 2017-12-12 | Disposition: A | Discharge status: Home / Self Care | Payer: Medicare Other | Attending: Emergency Medicine | Admitting: Emergency Medicine

## 2017-12-12 DIAGNOSIS — I10 Essential (primary) hypertension: Secondary | ICD-10-CM | POA: Insufficient documentation

## 2017-12-12 DIAGNOSIS — R0600 Dyspnea, unspecified: Secondary | ICD-10-CM | POA: Insufficient documentation

## 2017-12-12 DIAGNOSIS — Z79899 Other long term (current) drug therapy: Secondary | ICD-10-CM | POA: Insufficient documentation

## 2017-12-12 DIAGNOSIS — R079 Chest pain, unspecified: Secondary | ICD-10-CM | POA: Diagnosis not present

## 2017-12-12 DIAGNOSIS — Z87891 Personal history of nicotine dependence: Secondary | ICD-10-CM | POA: Insufficient documentation

## 2017-12-12 DIAGNOSIS — Z7901 Long term (current) use of anticoagulants: Secondary | ICD-10-CM | POA: Insufficient documentation

## 2017-12-12 DIAGNOSIS — R001 Bradycardia, unspecified: Secondary | ICD-10-CM | POA: Diagnosis not present

## 2017-12-12 DIAGNOSIS — Z96642 Presence of left artificial hip joint: Secondary | ICD-10-CM | POA: Diagnosis not present

## 2017-12-12 DIAGNOSIS — J80 Acute respiratory distress syndrome: Secondary | ICD-10-CM | POA: Diagnosis not present

## 2017-12-12 DIAGNOSIS — R0789 Other chest pain: Secondary | ICD-10-CM | POA: Diagnosis not present

## 2017-12-12 DIAGNOSIS — R0602 Shortness of breath: Secondary | ICD-10-CM | POA: Diagnosis not present

## 2017-12-12 DIAGNOSIS — Z96652 Presence of left artificial knee joint: Secondary | ICD-10-CM | POA: Insufficient documentation

## 2017-12-12 LAB — CBC WITH DIFFERENTIAL/PLATELET
Abs Immature Granulocytes: 0.04 10*3/uL (ref 0.00–0.07)
Basophils Absolute: 0.1 10*3/uL (ref 0.0–0.1)
Basophils Relative: 1 %
Eosinophils Absolute: 0.2 10*3/uL (ref 0.0–0.5)
Eosinophils Relative: 2 %
HCT: 40.3 % (ref 36.0–46.0)
Hemoglobin: 12.6 g/dL (ref 12.0–15.0)
Immature Granulocytes: 1 %
Lymphocytes Relative: 13 %
Lymphs Abs: 0.9 10*3/uL (ref 0.7–4.0)
MCH: 29.3 pg (ref 26.0–34.0)
MCHC: 31.3 g/dL (ref 30.0–36.0)
MCV: 93.7 fL (ref 80.0–100.0)
Monocytes Absolute: 0.5 10*3/uL (ref 0.1–1.0)
Monocytes Relative: 8 %
Neutro Abs: 5.1 10*3/uL (ref 1.7–7.7)
Neutrophils Relative %: 75 %
Platelets: 417 10*3/uL — ABNORMAL HIGH (ref 150–400)
RBC: 4.3 MIL/uL (ref 3.87–5.11)
RDW: 14.9 % (ref 11.5–15.5)
WBC: 6.7 10*3/uL (ref 4.0–10.5)
nRBC: 0 % (ref 0.0–0.2)

## 2017-12-12 LAB — I-STAT CHEM 8, ED
BUN: 17 mg/dL (ref 8–23)
Calcium, Ion: 1.18 mmol/L (ref 1.15–1.40)
Chloride: 100 mmol/L (ref 98–111)
Creatinine, Ser: 0.7 mg/dL (ref 0.44–1.00)
Glucose, Bld: 102 mg/dL — ABNORMAL HIGH (ref 70–99)
HCT: 41 % (ref 36.0–46.0)
Hemoglobin: 13.9 g/dL (ref 12.0–15.0)
Potassium: 4 mmol/L (ref 3.5–5.1)
Sodium: 136 mmol/L (ref 135–145)
TCO2: 28 mmol/L (ref 22–32)

## 2017-12-12 LAB — I-STAT TROPONIN, ED
Troponin i, poc: 0 ng/mL (ref 0.00–0.08)
Troponin i, poc: 0 ng/mL (ref 0.00–0.08)

## 2017-12-12 LAB — PROTIME-INR
INR: 1.13
Prothrombin Time: 14.4 seconds (ref 11.4–15.2)

## 2017-12-12 MED ORDER — IOPAMIDOL (ISOVUE-370) INJECTION 76%
INTRAVENOUS | Status: AC
  Filled 2017-12-12: qty 100

## 2017-12-12 MED ORDER — IOPAMIDOL (ISOVUE-370) INJECTION 76%
75.0000 mL | Freq: Once | INTRAVENOUS | Status: AC | PRN
  Administered 2017-12-12: 75 mL via INTRAVENOUS

## 2017-12-12 MED ORDER — FENTANYL CITRATE (PF) 100 MCG/2ML IJ SOLN
50.0000 ug | Freq: Once | INTRAMUSCULAR | Status: AC
  Administered 2017-12-12: 50 ug via INTRAVENOUS
  Filled 2017-12-12: qty 2

## 2017-12-12 NOTE — ED Notes (Signed)
Patient verbalizes understanding of discharge instructions. Opportunity for questioning and answers were provided. Armband removed by staff, pt discharged from ED via wheelchair with family.  

## 2017-12-12 NOTE — ED Notes (Signed)
Patient transported to X-ray 

## 2017-12-12 NOTE — ED Triage Notes (Signed)
Pt arrives to ED from home with complaints of epigastric chest pain and shortness of breath since yesterday at 4:30 in the afternoon. EMS reports pt took 1 nitro at home and 1 oxycodone at home with little improvement. EMS gave 324 asa, slight improvement, pain is exacerbated with deep breaths. Pt has recent left knee replacement last month. Pt placed in position of comfort with bed locked and lowered, call bell in reach. EMS last set of VS: Bp 174/60 after nitro, HR 56, 99% on 2L, RR 20

## 2017-12-12 NOTE — Discharge Instructions (Addendum)
Your INR was 1.13 today.

## 2017-12-12 NOTE — ED Notes (Signed)
Patient transported to CT 

## 2017-12-12 NOTE — ED Provider Notes (Signed)
[facility] EMERGENCY DEPARTMENT Provider Note   CSN: [id number] Arrival date & time: 12/12/17  0844     History   Chief Complaint Chief Complaint  Patient presents with  . Shortness of Breath  . Chest Pain    HPI Gloria [name] is a 84 y.o. female.  HPI Patient presents with chest pain shortness of breath.  Began in the mid chest yesterday.  Began while she was sitting.  Worse with breathing.  Feels short of breath.  Had a nitroglycerin at home.  She is around 3 weeks post left knee replacement.  She is on Coumadin for atrial fibrillation.  Has not had pains like this before.  States her leg feels to be healing well and is been in physical therapy. Past Medical History:  Diagnosis Date  . Arthritis    right  . Atrial fibrillation ([place])   . GERD (gastroesophageal reflux disease)    hx of  . Hepatitis C   . Humerus fracture   . Hyperlipidemia   . Hypertension   . Irritable bowel syndrome   . Meningioma ([place])   . PONV (postoperative nausea and vomiting)     Patient Active Problem List   Diagnosis Date Noted  . OA (osteoarthritis) of knee 11/24/2017  . TIA (transient ischemic attack) 07/14/2016  . Nonintractable headache   . Warfarin anticoagulation 04/09/2011    Class: Chronic  . Hypertension   . Irritable bowel syndrome   . Atrial fibrillation [facility])     Past Surgical History:  Procedure Laterality Date  . BREAST SURGERY     lumpectomy  . CARDIOVASCULAR STRESS TEST  11/14/2006   EF 67%  . COLONOSCOPY W/ BIOPSIES AND POLYPECTOMY    . ORIF HUMERUS FRACTURE Right 04/18/2014   Procedure: OPEN REDUCTION INTERNAL FIXATION (ORIF) RIGHT PROXIMAL HUMERUS FRACTURE;  Surgeon: Netta Cedars, MD;  Location: [facility];  Service: Orthopedics;  Laterality: Right;  . ORIF PERIPROSTHETIC FRACTURE Left 04/30/2013   Procedure: OPEN REDUCTION INTERNAL FIXATION (ORIF) PERIPROSTHETIC FRACTURE;  Surgeon: [name], MD;  Location: [facility];  Service: Orthopedics;   Laterality: Left;  . PAROTID GLAND TUMOR EXCISION    . TOTAL HIP ARTHROPLASTY  11/07   left  . TOTAL KNEE ARTHROPLASTY    . TOTAL KNEE ARTHROPLASTY Left 11/24/2017   Procedure: TOTAL KNEE ARTHROPLASTY;  Surgeon: Gaynelle Arabian, MD;  [facility];  Service: Orthopedics;  Laterality: Left;  68min  . TRANSTHORACIC ECHOCARDIOGRAM  10/25/2009   EF 55-60%     OB History   None      Home Medications    Prior to Admission medications   Medication Sig Start Date End Date Taking? Authorizing Provider  acetaminophen (TYLENOL) 500 MG tablet Take 500 mg by mouth every 6 (six) hours as needed for mild pain or headache.    Yes [provider]  Artificial Tear Ointment (DRY EYES OP) Place 1 drop into both eyes daily at 2 PM.   Yes [provider]  atorvastatin (LIPITOR) 10 MG tablet Take 10 mg by mouth at bedtime.   Yes [provider]  docusate sodium (COLACE) 50 MG capsule Take 50 mg by mouth daily as needed for mild constipation.   Yes [provider]  ergocalciferol (VITAMIN D2) 1.25 MG (50000 UT) capsule Take 50,000 Units by mouth every Friday.   Yes [provider]  flecainide (TAMBOCOR) 50 MG tablet Take 1 tablet (50 mg total) by mouth 2 (two) times daily. 04/29/11  Yes Martinique, Peter M, MD  fluticasone [facility]) 50 MCG/ACT nasal spray Place 1 spray into both nostrils daily at 2 PM. 12/05/17  Yes [provider]  hydrochlorothiazide (HYDRODIURIL) 25 MG tablet TAKE 1 TABLET BY MOUTH ONCE DAILY Patient taking differently: Take 25 mg by mouth daily.  03/17/17  Yes Martinique, Peter M, MD  losartan (COZAAR) 25 MG tablet TAKE 1 TABLET BY MOUTH ONCE DAILY Patient taking differently: Take 25 mg by mouth at bedtime.  06/20/17  Yes Martinique, Peter M, MD  methocarbamol (ROBAXIN) 500 MG tablet Take 1 tablet (500 mg total) by mouth every 6 (six) hours as needed for muscle spasms. 11/25/17  Yes Edmisten, Kristie L, PA  Multiple Vitamins-Minerals  (PRESERVISION AREDS PO) Take 1 capsule by mouth 2 (two) times daily.   Yes [provider]  nitroGLYCERIN (NITROSTAT) 0.4 MG SL tablet Place 1 tablet (0.4 mg total) under the tongue every 5 (five) minutes as needed for chest pain. 06/05/16  Yes Florencia Reasons, MD  omeprazole (PRILOSEC) 20 MG capsule Take 20 mg by mouth 2 (two) times daily.    Yes [provider]  ondansetron (ZOFRAN) 4 MG tablet Take 4 mg by mouth every 8 (eight) hours as needed. for nausea 12/03/17  Yes [provider]  oxyCODONE (OXY IR/ROXICODONE) 5 MG immediate release tablet Take 1-2 tablets (5-10 mg total) by mouth every 6 (six) hours as needed for severe pain. 11/25/17  Yes Edmisten, Kristie L, PA  polyethylene glycol (MIRALAX / GLYCOLAX) packet Take 17 g by mouth daily as needed for moderate constipation.   Yes [provider]  traMADol (ULTRAM) 50 MG tablet Take 1-2 tablets (50-100 mg total) by mouth every 6 (six) hours as needed for moderate pain. 11/25/17  Yes Edmisten, Kristie L, PA  vitamin B-12 (CYANOCOBALAMIN) 1000 MCG tablet Take 1,000 mcg by mouth every evening.    Yes [provider]  warfarin (COUMADIN) 6 MG tablet Take 3-6 tablets by mouth See admin instructions. Take 6 mg by mouth daily on Monday, Wednesday  Take 3 mg by mouth daily on all other days. 08/16/14  Yes [provider]    Family History Family History  Problem Relation Age of Onset  . Heart disease Brother     Social History Social History   Tobacco Use  . Smoking status: Former Smoker    Packs/day: 0.30    Years: 15.00    Pack years: 4.50    Types: Cigarettes    Last attempt to quit: 06/07/1980    Years since quitting: 37.5  . Smokeless tobacco: Never Used  Substance Use Topics  . Alcohol use: No  . Drug use: No     Allergies   Amlodipine; Cephalexin; Codeine; Metoclopramide hcl; and Penicillins   Review of Systems Review of Systems  Constitutional: Negative for appetite change.    HENT: Negative for congestion.   Respiratory: Positive for cough and shortness of breath.   Cardiovascular: Positive for chest pain and leg swelling.  Gastrointestinal: Negative for abdominal pain.  Genitourinary: Negative for flank pain.  Musculoskeletal: Negative for back pain.  Neurological: Negative for weakness.  Hematological: Negative for adenopathy.  Psychiatric/Behavioral: Negative for confusion.     Physical Exam Updated Vital Signs BP (!) 166/69   Pulse (!) 56   Temp 98.2 F (36.8 C) (Oral)   Resp 19   Ht 5\' 5"  (1.651 m)   Wt 64 kg   SpO2 98%   BMI 23.46 kg/m   Physical  Exam  Constitutional: She appears well-developed.  HENT:  Head: Normocephalic.  Eyes: Pupils are equal, round, and reactive to light.  Neck: Neck supple.  Cardiovascular:  Mild bradycardia  Pulmonary/Chest: Effort normal.  No chest tenderness.  Abdominal: There is no tenderness.  Musculoskeletal:  Some edema left lower extremity.  Left knee postsurgical.  Mild erythema.  No drainage.  Neurological: She is alert.  Skin: Skin is warm.  Psychiatric: She has a normal mood and affect.     ED Treatments / Results  Labs (all labs ordered are listed, but only abnormal results are displayed) Labs Reviewed  CBC WITH DIFFERENTIAL/PLATELET - Abnormal; Notable for the following components:      Result Value   Platelets 417 (*)    All other components within normal limits  I-STAT CHEM 8, ED - Abnormal; Notable for the following components:   Glucose, Bld 102 (*)    All other components within normal limits  PROTIME-INR  I-STAT TROPONIN, ED  I-STAT TROPONIN, ED    EKG EKG Interpretation  Date/Time:  Friday December 12 2017 09:17:14 EST Ventricular Rate:  55 PR Interval:    QRS Duration: 101 QT Interval:  456 QTC Calculation: 437 R Axis:   -39 Text Interpretation:  Sinus rhythm Left ventricular hypertrophy Anterior Q waves, possibly due to LVH No significant change since last tracing  Confirmed by Davonna Belling [phone]) on 12/12/2017 10:48:54 AM   Radiology Dg Chest 2 View  Result Date: 12/12/2017 CLINICAL DATA:  Epigastric pain and shortness of breath. History of atrial fibrillation, hypertension, hepatitis C, former smoker. EXAM: CHEST - 2 VIEW COMPARISON:  Chest x-ray of July 28, 2017 and PA and lateral chest x-ray of Jun 04, 2016 FINDINGS: The lungs are mildly hyperinflated. The interstitial markings are coarse though stable. Patchy density in the retrocardiac region on the left is more conspicuous than in the past. There is no pleural effusion or pneumothorax. The cardiac silhouette is enlarged. The pulmonary vascularity is normal. There is calcification in the wall of the aortic arch. The trachea is midline. The observed bony thorax exhibits no acute abnormality. IMPRESSION: Chronic bronchitic changes. Probable subsegmental atelectasis or developing pneumonia in the left lower lobe posteriorly. Stable cardiomegaly without pulmonary edema. Followup PA and lateral chest X-ray is recommended in 3-4 weeks following trial of antibiotic therapy to ensure resolution and exclude underlying malignancy. Thoracic aortic atherosclerosis. Electronically Signed   By: David  Martinique M.D.   On: 12/12/2017 09:43   Ct Angio Chest Pe W And/or Wo Contrast  Result Date: 12/12/2017 CLINICAL DATA:  Shortness of breath LEFT-sided chest pain. Recent knee surgery. PE suspected, high pretest probability. EXAM: CT ANGIOGRAPHY CHEST WITH CONTRAST TECHNIQUE: Multidetector CT imaging of the chest was performed using the standard protocol during bolus administration of intravenous contrast. Multiplanar CT image reconstructions and MIPs were obtained to evaluate the vascular anatomy. CONTRAST:  35mL ISOVUE-370 IOPAMIDOL (ISOVUE-370) INJECTION 76% COMPARISON:  Chest CT dated 04/12/2014. FINDINGS: Cardiovascular: Evaluation of the most peripheral segmental and subsegmental pulmonary artery branches is limited by  patient breathing motion artifact, especially in the RIGHT lower lobe. However, there is no pulmonary embolism identified within the main, lobar or central segmental pulmonary arteries bilaterally. Heart size is within normal limits. No pericardial effusion. Diffuse coronary artery calcifications, particularly dense within the LEFT main and LEFT anterior descending coronary arteries. Scattered aortic atherosclerosis. No thoracic aortic aneurysm. Insufficient contrast within the aorta to evaluate for dissection. Mediastinum/Nodes: No mass or enlarged lymph nodes seen  within the mediastinum or perihilar regions. Esophagus appears normal. Trachea and central bronchi are unremarkable. Lungs/Pleura: Lungs are clear. No evidence of pneumonia. No pulmonary edema. No pleural effusion or pneumothorax. Upper Abdomen: Limited images of the upper abdomen are unremarkable. Musculoskeletal: Degenerative spondylosis of the slightly kyphotic thoracic spine. No acute or suspicious osseous finding. Old rib fractures on the RIGHT. Review of the MIP images confirms the above findings. IMPRESSION: 1. No acute findings. No pulmonary embolism seen, with study limitations detailed above. No pneumonia or pulmonary edema. 2. Diffuse coronary artery calcifications, particularly dense within the LEFT main and LEFT anterior descending coronary arteries. Recommend correlation with any possible associated cardiac symptoms. 3. Old rib fractures on the RIGHT. Aortic Atherosclerosis (ICD10-I70.0). Electronically Signed   By: Franki Cabot M.D.   On: 12/12/2017 11:06    Procedures Procedures (including critical care time)  Medications Ordered in ED Medications  iopamidol (ISOVUE-370) 76 % injection (has no administration in time range)  fentaNYL (SUBLIMAZE) injection 50 mcg (50 mcg Intravenous Given 12/12/17 1045)  iopamidol (ISOVUE-370) 76 % injection 75 mL (75 mLs Intravenous Contrast Given 12/12/17 1033)     Initial Impression /  Assessment and Plan / ED Course  I have reviewed the triage vital signs and the nursing notes.  Pertinent labs & imaging results that were available during my care of the patient were reviewed by me and considered in my medical decision making (see chart for details).     Patient with chest pain.  However it is more of a dyspnea component.  Recent knee surgery.  CT angiography done and does not show clot.  Troponin negative x2.  Has had recent negative stress test although she does have calcifications in LAD on CT.  Discharge home.  INR is low.  PCP has been managing this and she [name] follow with Dr. Sharlett Iles.  Final Clinical Impressions(s) / ED Diagnoses   Final diagnoses:  Dyspnea, unspecified type  Atypical chest pain    ED Discharge Orders    None       Davonna Belling, MD 12/12/17 1523

## 2017-12-15 ENCOUNTER — Other Ambulatory Visit: Payer: Self-pay | Admitting: Cardiology

## 2017-12-15 DIAGNOSIS — I10 Essential (primary) hypertension: Secondary | ICD-10-CM | POA: Diagnosis not present

## 2017-12-15 DIAGNOSIS — I4891 Unspecified atrial fibrillation: Secondary | ICD-10-CM | POA: Diagnosis not present

## 2017-12-15 DIAGNOSIS — K219 Gastro-esophageal reflux disease without esophagitis: Secondary | ICD-10-CM | POA: Diagnosis not present

## 2017-12-15 DIAGNOSIS — K589 Irritable bowel syndrome without diarrhea: Secondary | ICD-10-CM | POA: Diagnosis not present

## 2017-12-15 DIAGNOSIS — Z471 Aftercare following joint replacement surgery: Secondary | ICD-10-CM | POA: Diagnosis not present

## 2017-12-15 DIAGNOSIS — D329 Benign neoplasm of meninges, unspecified: Secondary | ICD-10-CM | POA: Diagnosis not present

## 2017-12-16 ENCOUNTER — Encounter: Payer: Self-pay | Admitting: Physical Therapy

## 2017-12-16 ENCOUNTER — Ambulatory Visit: Payer: Medicare Other | Attending: Orthopedic Surgery | Admitting: Physical Therapy

## 2017-12-16 DIAGNOSIS — R262 Difficulty in walking, not elsewhere classified: Secondary | ICD-10-CM

## 2017-12-16 DIAGNOSIS — M25562 Pain in left knee: Secondary | ICD-10-CM | POA: Diagnosis not present

## 2017-12-16 DIAGNOSIS — M25662 Stiffness of left knee, not elsewhere classified: Secondary | ICD-10-CM | POA: Insufficient documentation

## 2017-12-16 DIAGNOSIS — Z96652 Presence of left artificial knee joint: Secondary | ICD-10-CM | POA: Diagnosis not present

## 2017-12-16 NOTE — Therapy (Signed)
[place], [place], [address] Phone: [phone]   Fax:  [phone]  Physical Therapy Treatment/Re-valuation   Patient Details  Name: INTISAR CLAUDIO MRN: [id number] Date of Birth: [birth year] Referring Provider (PT): Dr. Maureen Ralphs   Encounter Date: 12/16/2017  PT End of Session - 12/16/17 1056    Visit Number  2    Number of Visits  16    Date for PT Re-Evaluation  01/27/18    PT Start Time  9373    PT Stop Time  1104    PT Time Calculation (min)  49 min    Activity Tolerance  Patient tolerated treatment well    Behavior During Therapy  [facility] for tasks assessed/performed       Past Medical History:  Diagnosis Date  . Arthritis    right  . Atrial fibrillation ([place])   . GERD (gastroesophageal reflux disease)    hx of  . Hepatitis C   . Humerus fracture   . Hyperlipidemia   . Hypertension   . Irritable bowel syndrome   . Meningioma ([place])   . PONV (postoperative nausea and vomiting)     Past Surgical History:  Procedure Laterality Date  . BREAST SURGERY     lumpectomy  . CARDIOVASCULAR STRESS TEST  11/14/2006   EF 67%  . COLONOSCOPY W/ BIOPSIES AND POLYPECTOMY    . ORIF HUMERUS FRACTURE Right 04/18/2014   Procedure: OPEN REDUCTION INTERNAL FIXATION (ORIF) RIGHT PROXIMAL HUMERUS FRACTURE;  Surgeon: Netta Cedars, MD;  Location: [facility];  Service: Orthopedics;  Laterality: Right;  . ORIF PERIPROSTHETIC FRACTURE Left 04/30/2013   Procedure: OPEN REDUCTION INTERNAL FIXATION (ORIF) PERIPROSTHETIC FRACTURE;  Surgeon: [name], MD;  Location: [place];  Service: Orthopedics;  Laterality: Left;  . PAROTID GLAND TUMOR EXCISION    . TOTAL HIP ARTHROPLASTY  11/07   left  . TOTAL KNEE ARTHROPLASTY    . TOTAL KNEE ARTHROPLASTY Left 11/24/2017   Procedure: TOTAL KNEE ARTHROPLASTY;  Surgeon: Gaynelle Arabian, MD;  [facility];  Service: Orthopedics;  Laterality: Left;  67min  . TRANSTHORACIC ECHOCARDIOGRAM   10/25/2009   EF 55-60%    There were no vitals filed for this visit.  Subjective Assessment - 12/16/17 1019    Subjective  Had 3 visits HHPT but now feels ready for OPPT.  Saw MD last week.  No pain right now, premedicated      Currently in Pain?  No/denies    Pain Score  4     Pain Location  Knee    Pain Orientation  Left;Anterior;Posterior    Pain Descriptors / Indicators  Aching;Sore    Pain Type  Surgical pain    Pain Onset  1 to 4 weeks ago    Pain Frequency  Constant    Aggravating Factors   walking, bending    Pain Relieving Factors  ice, meds, propping it          [facility] PT Assessment - 12/16/17 0001      Assessment   Medical Diagnosis  L TKA     Referring Provider (PT)  Dr. Maureen Ralphs    Onset Date/Surgical Date  11/24/17    Prior Therapy  Yes, for Rt knee , no HHA       Precautions   Precautions  Other (comment)    Precaution Comments  general fall risk for frail older adult    Coumadin      Restrictions  Weight Bearing Restrictions  No      Balance Screen   Has the patient fallen in the past 6 months  Yes    How many times?  1   July    Has the patient had a decrease in activity level because of a fear of falling?   Yes    Is the patient reluctant to leave their home because of a fear of falling?   Yes      [place] residence    Living Arrangements  Alone    Available Help at Discharge  Family    Type of [place] to enter    Entrance Stairs-Number of Steps  2    [place]  One level    [place] - 2 wheels;Bedside commode;Shower seat      Prior Function   Level of Independence  Requires assistive device for independence;Needs assistance with ADLs;Needs assistance with homemaking;Needs assistance with gait;Needs assistance with transfers    Vocation  Retired    Leisure  visiting family       Cognition   Overall Cognitive Status  Within  Functional Limits for tasks assessed      Observation/Other Assessments-Edema    Edema  Circumferential      Circumferential Edema   Circumferential - Right  14.5    Circumferential - Left   16 inch       Posture/Postural Control   Posture/Postural Control  Postural limitations    Postural Limitations  Rounded Shoulders;Forward head;Increased thoracic kyphosis    Posture Comments  decreased knee flexion with gait       AROM   Left Knee Extension  -8    Left Knee Flexion  90   with strap      Strength   Left Hip ABduction  3-/5    Left Knee Flexion  4-/5   pain    Left Knee Extension  4/5   pain      Transfers   Sit to Stand  6: Modified independent (Device/Increase time)    Stand Pivot Transfers  6: Modified independent (Device/Increase time)      Ambulation/Gait   Ambulation Distance (Feet)  150 Feet    Assistive device  Rolling walker    Gait Pattern  Step-to pattern    Gait Comments  decreased gait velocity                    [facility] Adult PT Treatment/Exercise - 12/16/17 0001      Knee/Hip Exercises: Stretches   Knee: Self-Stretch to increase Flexion  Left;20 seconds    Knee: Self-Stretch Limitations  x 10       Knee/Hip Exercises: Aerobic   Nustep  5 min for ROM and strength , L3 LE only       Knee/Hip Exercises: Seated   Long Arc Quad  Strengthening;Left;1 set;20 reps    Heel Slides  AAROM;Strengthening;Left;1 set;20 reps      Knee/Hip Exercises: Supine   Quad Sets  Strengthening;Both;1 set;15 reps    Short Arc Quad Sets  Strengthening;Left;1 set;20 reps    Short Arc Quad Sets Limitations  2.5 lbs     Straight Leg Raises  Strengthening;Left;2 sets;10 reps      Cryotherapy   Number Minutes Cryotherapy  10 Minutes    Cryotherapy Location  Knee  Manual Therapy   Manual Therapy  Edema management;Soft tissue mobilization;Passive ROM    Soft tissue mobilization  quads , anterior knee for edema, discomfort     Passive ROM  patellar mobs,  extension                PT Short Term Goals - 12/16/17 1047      PT SHORT TERM GOAL #1   Title  Patient will be I with HEP for L knee ROM, swelling, strength     Time  3    Period  Weeks    Status  New    Target Date  01/06/18      PT SHORT TERM GOAL #2   Title  Pt will be able to flex her L knee to 90 deg with AAROM working towards normal gait, transfers     Time  3    Status  Achieved    Target Date  01/06/18      PT SHORT TERM GOAL #3   Title  Pt will be able to walk 300 feet with improved knee flexion, pain controlled, min increase in pain from baseline.     Time  3    Period  Weeks    Status  New    Target Date  01/06/18      PT SHORT TERM GOAL #4   Title  Pt will be mod I with stair negotiation (has 2 STE) with 1 rail    Status  Achieved    Target Date  01/06/18      PT SHORT TERM GOAL #5   Title  Pt will be screened for balance and goal TBA     Time  3    Status  New    Target Date  01/06/18        PT Long Term Goals - 12/16/17 1048      PT LONG TERM GOAL #1   Title  Pt will be I with her HEP upon discharge     Time  6    Period  Weeks    Status  New    Target Date  01/27/18      PT LONG TERM GOAL #2   Title  Pt will demo 4+/5 or more strength in knees, hips for improved gait stability and function in her home.     Time  6    Period  Weeks    Status  New    Target Date  01/27/18      PT LONG TERM GOAL #3   Title  pt will be able to extend her knee to -8 deg or better for improved gait    Time  6    Period  Weeks    Status  New    Target Date  01/27/18      PT LONG TERM GOAL #4   Title  Pt will be able to sit with knee flexed to 100 deg for normal transfers     Time  6    Period  Weeks    Status  New    Target Date  01/27/18      PT LONG TERM GOAL #5   Title  Pt will be able to walk in the community with RW > 500 feet without lasting increase in pain.     Time  6    Period  Weeks    Status  New    Target Date  01/27/18  Plan - 12/16/17 1044    Clinical Impression Statement  Patient returns to PT after a short episode of HHPT.  Her AROM is much improved, overall energy levels.  The bruising on her thigh is much bettter.  She has been keeping up with HEP, should do very well with PT.     Rehab Potential  Excellent    PT Frequency  2x / week    PT Duration  6 weeks    PT Treatment/Interventions  ADLs/Self Care Home Management;Functional mobility training;Taping;Vasopneumatic Device;Passive range of motion;Manual lymph drainage;Manual techniques;Patient/family education;Balance training;Therapeutic exercise;Therapeutic activities;Traction;Cryotherapy;Ultrasound;Gait training;Stair training;DME Instruction    PT Next Visit Plan  level 1 knee, gentle, ROM, RICE, manage edema, gait    PT Home Exercise Plan  level 1 knee     Consulted and Agree with Plan of Care  Patient       Patient will benefit from skilled therapeutic intervention in order to improve the following deficits and impairments:  Abnormal gait, Decreased balance, Decreased endurance, Decreased mobility, Decreased skin integrity, Difficulty walking, Hypomobility, Increased edema, Decreased scar mobility, Decreased range of motion, Decreased activity tolerance, Decreased safety awareness, Decreased strength, Increased fascial restricitons, Impaired flexibility, Impaired UE functional use, Postural dysfunction, Pain  Visit Diagnosis: Status post total left knee replacement  Stiffness of left knee, not elsewhere classified  Acute pain of left knee  Difficulty in walking, not elsewhere classified     Problem List Patient Active Problem List   Diagnosis Date Noted  . OA (osteoarthritis) of knee 11/24/2017  . TIA (transient ischemic attack) 07/14/2016  . Nonintractable headache   . Warfarin anticoagulation 04/09/2011    Class: Chronic  . Hypertension   . Irritable bowel syndrome   . Atrial fibrillation [facility])      PAA,JENNIFER 12/16/2017, 10:59 AM  [facility] [address] [place], [place], [address] Phone: [phone]   Fax:  [phone]  Name: DEZIYA AMERO MRN: [id number] Date of Birth: [birth year]  Raeford Razor, PT 12/16/17 10:59 AM Phone: [phone] Fax: [phone]

## 2017-12-17 ENCOUNTER — Other Ambulatory Visit: Payer: Self-pay

## 2017-12-17 MED ORDER — LOSARTAN POTASSIUM 25 MG PO TABS: 25.0000 mg | ORAL_TABLET | Freq: Every day | ORAL | 1 refills | Status: DC

## 2017-12-18 ENCOUNTER — Ambulatory Visit: Payer: Medicare Other | Admitting: Physical Therapy

## 2017-12-18 ENCOUNTER — Encounter: Payer: Self-pay | Admitting: Physical Therapy

## 2017-12-18 DIAGNOSIS — M25662 Stiffness of left knee, not elsewhere classified: Secondary | ICD-10-CM

## 2017-12-18 DIAGNOSIS — R262 Difficulty in walking, not elsewhere classified: Secondary | ICD-10-CM

## 2017-12-18 DIAGNOSIS — Z96652 Presence of left artificial knee joint: Secondary | ICD-10-CM

## 2017-12-18 DIAGNOSIS — M25562 Pain in left knee: Secondary | ICD-10-CM

## 2017-12-18 NOTE — Therapy (Signed)
[place], [place], [address] Phone: [phone]   Fax:  [phone]  Physical Therapy Treatment  Patient Details  Name: Gloria [name] MRN: [id number] Date of Birth: February 04, 1930 Referring Provider (PT): Dr. Maureen Ralphs   Encounter Date: 12/18/2017  PT End of Session - 12/18/17 1351    Visit Number  3    Number of Visits  16    Date for PT Re-Evaluation  01/27/18    PT Start Time  1332    PT Stop Time  1430    PT Time Calculation (min)  58 min    Activity Tolerance  Patient tolerated treatment well    Behavior During Therapy  [facility] for tasks assessed/performed       Past Medical History:  Diagnosis Date  . Arthritis    right  . Atrial fibrillation ([place])   . GERD (gastroesophageal reflux disease)    hx of  . Hepatitis C   . Humerus fracture   . Hyperlipidemia   . Hypertension   . Irritable bowel syndrome   . Meningioma ([place])   . PONV (postoperative nausea and vomiting)     Past Surgical History:  Procedure Laterality Date  . BREAST SURGERY     lumpectomy  . CARDIOVASCULAR STRESS TEST  11/14/2006   EF 67%  . COLONOSCOPY W/ BIOPSIES AND POLYPECTOMY    . ORIF HUMERUS FRACTURE Right 04/18/2014   Procedure: OPEN REDUCTION INTERNAL FIXATION (ORIF) RIGHT PROXIMAL HUMERUS FRACTURE;  Surgeon: Netta Cedars, MD;  Location: [place];  Service: Orthopedics;  Laterality: Right;  . ORIF PERIPROSTHETIC FRACTURE Left 04/30/2013   Procedure: OPEN REDUCTION INTERNAL FIXATION (ORIF) PERIPROSTHETIC FRACTURE;  Surgeon: [name], MD;  Location: [place];  Service: Orthopedics;  Laterality: Left;  . PAROTID GLAND TUMOR EXCISION    . TOTAL HIP ARTHROPLASTY  11/07   left  . TOTAL KNEE ARTHROPLASTY    . TOTAL KNEE ARTHROPLASTY Left 11/24/2017   Procedure: TOTAL KNEE ARTHROPLASTY;  Surgeon: Gaynelle Arabian, MD;  [facility];  Service: Orthopedics;  Laterality: Left;  11min  . TRANSTHORACIC ECHOCARDIOGRAM  10/25/2009    EF 55-60%    There were no vitals filed for this visit.  Subjective Assessment - 12/18/17 1340    Subjective  I slept the best I have in a long time last night.  Pain minimal.  Today stiff, pain 3/10.     Currently in Pain?  Yes    Pain Score  3     Pain Location  Knee    Pain Orientation  Left    Pain Descriptors / Indicators  Tightness    Pain Type  Surgical pain    Pain Onset  1 to 4 weeks ago    Pain Frequency  Constant    Aggravating Factors   walking, bending     Pain Relieving Factors  ice , meds, propping             [facility] Adult PT Treatment/Exercise - 12/18/17 0001      Knee/Hip Exercises: Stretches   Knee: Self-Stretch to increase Flexion  Left;20 seconds    Knee: Self-Stretch Limitations  x 10       Knee/Hip Exercises: Aerobic   Nustep  5 min for ROM and strength , L3 LE only       Knee/Hip Exercises: Standing   Heel Raises  Both;1 set;15 reps    Wall Squat  10 reps    Other Standing  Knee Exercises  wall slide x 10 LLE forward for quad activation       Knee/Hip Exercises: Seated   Long Arc Quad  Strengthening;Left;1 set;20 reps;Weights    Long Arc Quad Weight  3 lbs.      Knee/Hip Exercises: Supine   Quad Sets  Strengthening;Left;1 set    [employer]  Strengthening;Both;1 set;10 reps    Straight Leg Raises  Strengthening;Left;1 set;10 reps      Knee/Hip Exercises: Sidelying   Hip ABduction  Strengthening;Both;2 sets;10 reps      Cryotherapy   Number Minutes Cryotherapy  10 Minutes    Cryotherapy Location  Knee    Type of Cryotherapy  Ice pack      Manual Therapy   Edema Management  retromassage    Soft tissue mobilization  quads , anterior/lateral  knee for edema, discomfort              PT Education - 12/18/17 1351    Education Details  goals for rehab , gait     Person(s) Educated  Patient    Methods  Explanation    Comprehension  Verbalized understanding       PT Short Term Goals - 12/16/17 1047      PT SHORT TERM GOAL #1    Title  Patient will be I with HEP for L knee ROM, swelling, strength     Time  3    Period  Weeks    Status  New    Target Date  01/06/18      PT SHORT TERM GOAL #2   Title  Pt will be able to flex her L knee to 90 deg with AAROM working towards normal gait, transfers     Time  3    Status  Achieved    Target Date  01/06/18      PT SHORT TERM GOAL #3   Title  Pt will be able to walk 300 feet with improved knee flexion, pain controlled, min increase in pain from baseline.     Time  3    Period  Weeks    Status  New    Target Date  01/06/18      PT SHORT TERM GOAL #4   Title  Pt will be mod I with stair negotiation (has 2 STE) with 1 rail    Status  Achieved    Target Date  01/06/18      PT SHORT TERM GOAL #5   Title  Pt will be screened for balance and goal TBA     Time  3    Status  New    Target Date  01/06/18        PT Long Term Goals - 12/16/17 1048      PT LONG TERM GOAL #1   Title  Pt will be I with her HEP upon discharge     Time  6    Period  Weeks    Status  New    Target Date  01/27/18      PT LONG TERM GOAL #2   Title  Pt will demo 4+/5 or more strength in knees, hips for improved gait stability and function in her home.     Time  6    Period  Weeks    Status  New    Target Date  01/27/18      PT LONG TERM GOAL #3   Title  pt will be able  to extend her knee to -8 deg or better for improved gait    Time  6    Period  Weeks    Status  New    Target Date  01/27/18      PT LONG TERM GOAL #4   Title  Pt will be able to sit with knee flexed to 100 deg for normal transfers     Time  6    Period  Weeks    Status  New    Target Date  01/27/18      PT LONG TERM GOAL #5   Title  Pt will be able to walk in the community with RW > 500 feet without lasting increase in pain.     Time  6    Period  Weeks    Status  New    Target Date  01/27/18            Plan - 12/18/17 1352    Clinical Impression Statement  Patient doing well overall,  maintaining 90 deg knee flexion.  SLR aggravate her L hip (has "cable" in her L hip) .  Needs frequent rest breaks during ther ex.     PT Treatment/Interventions  ADLs/Self Care Home Management;Functional mobility training;Taping;Vasopneumatic Device;Passive range of motion;Manual lymph drainage;Manual techniques;Patient/family education;Balance training;Therapeutic exercise;Therapeutic activities;Traction;Cryotherapy;Ultrasound;Gait training;Stair training;DME Instruction    PT Next Visit Plan  gait with cane , gentle ROM     PT Home Exercise Plan  level 1 knee     Consulted and Agree with Plan of Care  Patient       Patient will benefit from skilled therapeutic intervention in order to improve the following deficits and impairments:  Abnormal gait, Decreased balance, Decreased endurance, Decreased mobility, Decreased skin integrity, Difficulty walking, Hypomobility, Increased edema, Decreased scar mobility, Decreased range of motion, Decreased activity tolerance, Decreased safety awareness, Decreased strength, Increased fascial restricitons, Impaired flexibility, Impaired UE functional use, Postural dysfunction, Pain  Visit Diagnosis: Status post total left knee replacement  Stiffness of left knee, not elsewhere classified  Acute pain of left knee  Difficulty in walking, not elsewhere classified     Problem List Patient Active Problem List   Diagnosis Date Noted  . OA (osteoarthritis) of knee 11/24/2017  . TIA (transient ischemic attack) 07/14/2016  . Nonintractable headache   . Warfarin anticoagulation 04/09/2011    Class: Chronic  . Hypertension   . Irritable bowel syndrome   . Atrial fibrillation ([place])     PAA,JENNIFER 12/18/2017, 2:22 PM  [facility] [address] [place], [place], [address] Phone: [phone]   Fax:  [phone]  Name: AMAIA LAVALLIE MRN: [id number] Date of Birth: 1931-01-07  Raeford Razor,  PT 12/18/17 2:22 PM Phone: [phone] Fax: [phone]

## 2017-12-19 ENCOUNTER — Ambulatory Visit: Payer: Medicare Other | Admitting: Cardiology

## 2017-12-22 ENCOUNTER — Encounter: Payer: Self-pay | Admitting: Physical Therapy

## 2017-12-22 ENCOUNTER — Ambulatory Visit: Payer: Medicare Other | Admitting: Physical Therapy

## 2017-12-22 DIAGNOSIS — R262 Difficulty in walking, not elsewhere classified: Secondary | ICD-10-CM | POA: Diagnosis not present

## 2017-12-22 DIAGNOSIS — M25562 Pain in left knee: Secondary | ICD-10-CM | POA: Diagnosis not present

## 2017-12-22 DIAGNOSIS — M25662 Stiffness of left knee, not elsewhere classified: Secondary | ICD-10-CM

## 2017-12-22 DIAGNOSIS — Z96652 Presence of left artificial knee joint: Secondary | ICD-10-CM

## 2017-12-22 NOTE — Therapy (Signed)
Gloria [name], [place], [address] Phone: [phone]   Fax:  [phone]  Physical Therapy Treatment  Patient Details  Name: Gloria [name] MRN: [id number] Date of Birth: Jun 15, 1930 Referring Provider (PT): Dr. Maureen Ralphs   Encounter Date: 12/22/2017  PT End of Session - 12/22/17 0936    Visit Number  4    Number of Visits  16    Date for PT Re-Evaluation  01/27/18    PT Start Time  0932    PT Stop Time  1022    PT Time Calculation (min)  50 min       Past Medical History:  Diagnosis Date  . Arthritis    right  . Atrial fibrillation ([place])   . GERD (gastroesophageal reflux disease)    hx of  . Hepatitis C   . Humerus fracture   . Hyperlipidemia   . Hypertension   . Irritable bowel syndrome   . Meningioma ([place])   . PONV (postoperative nausea and vomiting)     Past Surgical History:  Procedure Laterality Date  . BREAST SURGERY     lumpectomy  . CARDIOVASCULAR STRESS TEST  11/14/2006   EF 67%  . COLONOSCOPY W/ BIOPSIES AND POLYPECTOMY    . ORIF HUMERUS FRACTURE Right 04/18/2014   Procedure: OPEN REDUCTION INTERNAL FIXATION (ORIF) RIGHT PROXIMAL HUMERUS FRACTURE;  Surgeon: Netta Cedars, MD;  Location: [place];  Service: Orthopedics;  Laterality: Right;  . ORIF PERIPROSTHETIC FRACTURE Left 04/30/2013   Procedure: OPEN REDUCTION INTERNAL FIXATION (ORIF) PERIPROSTHETIC FRACTURE;  Surgeon: [name], MD;  Location: [place];  Service: Orthopedics;  Laterality: Left;  . PAROTID GLAND TUMOR EXCISION    . TOTAL HIP ARTHROPLASTY  11/07   left  . TOTAL KNEE ARTHROPLASTY    . TOTAL KNEE ARTHROPLASTY Left 11/24/2017   Procedure: TOTAL KNEE ARTHROPLASTY;  Surgeon: Gaynelle Arabian, MD;  [facility];  Service: Orthopedics;  Laterality: Left;  27min  . TRANSTHORACIC ECHOCARDIOGRAM  10/25/2009   EF 55-60%    There were no vitals filed for this visit.  Subjective Assessment - 12/22/17 0935    Subjective  I  am already hurting this morning.     Currently in Pain?  Yes    Pain Score  4     Pain Location  Knee    Pain Orientation  Left    Pain Descriptors / Indicators  Tightness   feels like a tourniquet   Pain Type  Surgical pain         [facility] PT Assessment - 12/22/17 0001      AROM   Left Knee Flexion  92      PROM   Overall PROM Comments  96                   [facility] Adult PT Treatment/Exercise - 12/22/17 0001      Knee/Hip Exercises: Stretches   Active Hamstring Stretch  3 reps;10 seconds    Knee: Self-Stretch to increase Flexion  Left;20 seconds    Knee: Self-Stretch Limitations  seated scoot to edge 10 sec x 5       Knee/Hip Exercises: Aerobic   Nustep  5 min L3       Knee/Hip Exercises: Standing   Heel Raises  10 reps    Hip Flexion  10 reps;Right;Left    Hip Abduction  10 reps;Right;Left    Functional Squat  10 reps  Knee/Hip Exercises: Seated   Long Arc Quad  Strengthening;Left;1 set;20 reps;Weights    Long Arc Quad Weight  3 lbs.    Heel Slides  AAROM;Strengthening;Left;1 set;20 reps    Heel Slides Limitations  pillow case slide       Knee/Hip Exercises: Supine   Quad Sets  Strengthening;Left;1 set;10 reps    Heel Slides  10 reps;2 sets    Heel Slides Limitations  2 reps with strap       Knee/Hip Exercises: Sidelying   Hip ABduction Limitations  left very small lift, painful    Clams  left x 10       Cryotherapy   Number Minutes Cryotherapy  10 Minutes    Cryotherapy Location  Knee    Type of Cryotherapy  Ice pack      Manual Therapy   Edema Management  massage roller to thigh in hip flexor stretch off edge of mat                PT Short Term Goals - 12/16/17 1047      PT SHORT TERM GOAL #1   Title  Patient will be I with HEP for L knee ROM, swelling, strength     Time  3    Period  Weeks    Status  New    Target Date  01/06/18      PT SHORT TERM GOAL #2   Title  Pt will be able to flex her L knee to 90 deg with AAROM  working towards normal gait, transfers     Time  3    Status  Achieved    Target Date  01/06/18      PT SHORT TERM GOAL #3   Title  Pt will be able to walk 300 feet with improved knee flexion, pain controlled, min increase in pain from baseline.     Time  3    Period  Weeks    Status  New    Target Date  01/06/18      PT SHORT TERM GOAL #4   Title  Pt will be mod I with stair negotiation (has 2 STE) with 1 rail    Status  Achieved    Target Date  01/06/18      PT SHORT TERM GOAL #5   Title  Pt will be screened for balance and goal TBA     Time  3    Status  New    Target Date  01/06/18        PT Long Term Goals - 12/16/17 1048      PT LONG TERM GOAL #1   Title  Pt will be I with her HEP upon discharge     Time  6    Period  Weeks    Status  New    Target Date  01/27/18      PT LONG TERM GOAL #2   Title  Pt will demo 4+/5 or more strength in knees, hips for improved gait stability and function in her home.     Time  6    Period  Weeks    Status  New    Target Date  01/27/18      PT LONG TERM GOAL #3   Title  pt will be able to extend her knee to -8 deg or better for improved gait    Time  6    Period  Weeks  Status  New    Target Date  01/27/18      PT LONG TERM GOAL #4   Title  Pt will be able to sit with knee flexed to 100 deg for normal transfers     Time  6    Period  Weeks    Status  New    Target Date  01/27/18      PT LONG TERM GOAL #5   Title  Pt will be able to walk in the community with RW > 500 feet without lasting increase in pain.     Time  6    Period  Weeks    Status  New    Target Date  01/27/18            Plan - 12/22/17 1017    Clinical Impression Statement  She continues to make small improvments in AROM. Some increased pain with weight bearing exercise although she tolerated it well. Unable to lift left leg into side abduction today. Performed clam instead which she quickly fatigued.     PT Next Visit Plan  gait with cane ,  gentle ROM     PT Home Exercise Plan  level 1 knee     Consulted and Agree with Plan of Care  Patient       Patient will benefit from skilled therapeutic intervention in order to improve the following deficits and impairments:  Abnormal gait, Decreased balance, Decreased endurance, Decreased mobility, Decreased skin integrity, Difficulty walking, Hypomobility, Increased edema, Decreased scar mobility, Decreased range of motion, Decreased activity tolerance, Decreased safety awareness, Decreased strength, Increased fascial restricitons, Impaired flexibility, Impaired UE functional use, Postural dysfunction, Pain  Visit Diagnosis: Status post total left knee replacement  Stiffness of left knee, not elsewhere classified  Acute pain of left knee  Difficulty in walking, not elsewhere classified     Problem List Patient Active Problem List   Diagnosis Date Noted  . OA (osteoarthritis) of knee 11/24/2017  . TIA (transient ischemic attack) 07/14/2016  . Nonintractable headache   . Warfarin anticoagulation 04/09/2011    Class: Chronic  . Hypertension   . Irritable bowel syndrome   . Atrial fibrillation [facility])     Dorene Ar, [place] 12/22/2017, 10:19 AM  [place], [place], [address] Phone: [phone]   Fax:  [phone]  Name: Gloria [name] MRN: [id number] Date of Birth: December 28, 1930

## 2017-12-23 DIAGNOSIS — I10 Essential (primary) hypertension: Secondary | ICD-10-CM | POA: Diagnosis not present

## 2017-12-23 DIAGNOSIS — I480 Paroxysmal atrial fibrillation: Secondary | ICD-10-CM | POA: Diagnosis not present

## 2017-12-23 DIAGNOSIS — Z7901 Long term (current) use of anticoagulants: Secondary | ICD-10-CM | POA: Diagnosis not present

## 2017-12-23 DIAGNOSIS — Z96652 Presence of left artificial knee joint: Secondary | ICD-10-CM | POA: Diagnosis not present

## 2017-12-23 DIAGNOSIS — Z6823 Body mass index (BMI) 23.0-23.9, adult: Secondary | ICD-10-CM | POA: Diagnosis not present

## 2017-12-24 ENCOUNTER — Encounter: Payer: Self-pay | Admitting: Physical Therapy

## 2017-12-24 ENCOUNTER — Ambulatory Visit: Payer: Medicare Other | Admitting: Physical Therapy

## 2017-12-24 DIAGNOSIS — R262 Difficulty in walking, not elsewhere classified: Secondary | ICD-10-CM

## 2017-12-24 DIAGNOSIS — Z96652 Presence of left artificial knee joint: Secondary | ICD-10-CM | POA: Diagnosis not present

## 2017-12-24 DIAGNOSIS — M25662 Stiffness of left knee, not elsewhere classified: Secondary | ICD-10-CM | POA: Diagnosis not present

## 2017-12-24 DIAGNOSIS — M25562 Pain in left knee: Secondary | ICD-10-CM | POA: Diagnosis not present

## 2017-12-24 NOTE — Therapy (Signed)
[place], [place], [address] Phone: [phone]   Fax:  [phone]  Physical Therapy Treatment  Patient Details  Name: Gloria [name] MRN: [id number] Date of Birth: [birth year] Referring Provider (PT): Dr. Maureen Ralphs   Encounter Date: 12/24/2017  PT End of Session - 12/24/17 1142    Visit Number  5    Number of Visits  16    Date for PT Re-Evaluation  01/27/18    PT Start Time  1100    PT Stop Time  1145    PT Time Calculation (min)  45 min       Past Medical History:  Diagnosis Date  . Arthritis    right  . Atrial fibrillation ([place])   . GERD (gastroesophageal reflux disease)    hx of  . Hepatitis C   . Humerus fracture   . Hyperlipidemia   . Hypertension   . Irritable bowel syndrome   . Meningioma ([place])   . PONV (postoperative nausea and vomiting)     Past Surgical History:  Procedure Laterality Date  . BREAST SURGERY     lumpectomy  . CARDIOVASCULAR STRESS TEST  11/14/2006   EF 67%  . COLONOSCOPY W/ BIOPSIES AND POLYPECTOMY    . ORIF HUMERUS FRACTURE Right 04/18/2014   Procedure: OPEN REDUCTION INTERNAL FIXATION (ORIF) RIGHT PROXIMAL HUMERUS FRACTURE;  Surgeon: Netta Cedars, MD;  Location: [place];  Service: Orthopedics;  Laterality: Right;  . ORIF PERIPROSTHETIC FRACTURE Left 04/30/2013   Procedure: OPEN REDUCTION INTERNAL FIXATION (ORIF) PERIPROSTHETIC FRACTURE;  Surgeon: [name], MD;  Location: [place];  Service: Orthopedics;  Laterality: Left;  . PAROTID GLAND TUMOR EXCISION    . TOTAL HIP ARTHROPLASTY  11/07   left  . TOTAL KNEE ARTHROPLASTY    . TOTAL KNEE ARTHROPLASTY Left 11/24/2017   Procedure: TOTAL KNEE ARTHROPLASTY;  Surgeon: Gaynelle Arabian, MD;  [facility];  Service: Orthopedics;  Laterality: Left;  52min  . TRANSTHORACIC ECHOCARDIOGRAM  10/25/2009   EF 55-60%    There were no vitals filed for this visit.  Subjective Assessment - 12/24/17 1105    Subjective   Very sore after last session. I brought my cane today.     Currently in Pain?  Yes    Pain Score  5     Pain Location  Knee    Pain Orientation  Left    Pain Descriptors / Indicators  Tightness    Aggravating Factors   walking, bending     Pain Relieving Factors  ice, meds, propping         [facility] PT Assessment - 12/24/17 0001      AROM   Left Knee Flexion  92                   [facility] Adult PT Treatment/Exercise - 12/24/17 0001      Ambulation/Gait   Ambulation Distance (Feet)  75 Feet    Assistive device  Straight cane    Gait Pattern  Step-through pattern    Ambulation Surface  Indoor    Gait Comments  Safe in clinic with [facility]       Knee/Hip Exercises: Aerobic   Recumbent Bike  partial revolutions      Knee/Hip Exercises: Standing   Heel Raises  10 reps    Knee Flexion  10 reps    Hip Flexion  10 reps;Right;Left    Hip Abduction  10 reps;Right;Left  Hip Extension  10 reps;Right;Left    Functional Squat  10 reps      Knee/Hip Exercises: Seated   Long Arc Quad  Strengthening;Left;1 set;20 reps;Weights      Knee/Hip Exercises: Supine   Quad Sets  20 reps    Quad Sets Limitations  with and without heel prop     Short Arc Quad Sets  2 sets;10 reps    Heel Slides  10 reps;2 sets    Bridges  Strengthening;Both;1 set;10 reps    Straight Leg Raises  5 reps;2 sets      Knee/Hip Exercises: Sidelying   Clams  left x 10       Modalities   Modalities  Vasopneumatic      Vasopneumatic   Number Minutes Vasopneumatic   10 minutes    Vasopnuematic Location   Knee    Vasopneumatic Pressure  Low    Vasopneumatic Temperature   32               PT Short Term Goals - 12/16/17 1047      PT SHORT TERM GOAL #1   Title  Patient will be I with HEP for L knee ROM, swelling, strength     Time  3    Period  Weeks    Status  New    Target Date  01/06/18      PT SHORT TERM GOAL #2   Title  Pt will be able to flex her L knee to 90 deg with AAROM working  towards normal gait, transfers     Time  3    Status  Achieved    Target Date  01/06/18      PT SHORT TERM GOAL #3   Title  Pt will be able to walk 300 feet with improved knee flexion, pain controlled, min increase in pain from baseline.     Time  3    Period  Weeks    Status  New    Target Date  01/06/18      PT SHORT TERM GOAL #4   Title  Pt will be mod I with stair negotiation (has 2 STE) with 1 rail    Status  Achieved    Target Date  01/06/18      PT SHORT TERM GOAL #5   Title  Pt will be screened for balance and goal TBA     Time  3    Status  New    Target Date  01/06/18        PT Long Term Goals - 12/16/17 1048      PT LONG TERM GOAL #1   Title  Pt will be I with her HEP upon discharge     Time  6    Period  Weeks    Status  New    Target Date  01/27/18      PT LONG TERM GOAL #2   Title  Pt will demo 4+/5 or more strength in knees, hips for improved gait stability and function in her home.     Time  6    Period  Weeks    Status  New    Target Date  01/27/18      PT LONG TERM GOAL #3   Title  pt will be able to extend her knee to -8 deg or better for improved gait    Time  6    Period  Weeks    Status  New    Target Date  01/27/18      PT LONG TERM GOAL #4   Title  Pt will be able to sit with knee flexed to 100 deg for normal transfers     Time  6    Period  Weeks    Status  New    Target Date  01/27/18      PT LONG TERM GOAL #5   Title  Pt will be able to walk in the community with RW > 500 feet without lasting increase in pain.     Time  6    Period  Weeks    Status  New    Target Date  01/27/18            Plan - 12/24/17 1144    Clinical Impression Statement  Increased soreness after last session and she reports increased swelling, Reviewed exercises. She brought [facility] and is safe in clinic. Will use [facility] indoor full time and take RW outdoors. Began partial revolutions on the recumbent bike. Trial of Vaso today for edema.     PT Next  Visit Plan   gentle ROM , assess vaso , gait (encouraged [facility] indoor for now)     PT Home Exercise Plan  level 1 knee     Consulted and Agree with Plan of Care  Patient       Patient will benefit from skilled therapeutic intervention in order to improve the following deficits and impairments:  Abnormal gait, Decreased balance, Decreased endurance, Decreased mobility, Decreased skin integrity, Difficulty walking, Hypomobility, Increased edema, Decreased scar mobility, Decreased range of motion, Decreased activity tolerance, Decreased safety awareness, Decreased strength, Increased fascial restricitons, Impaired flexibility, Impaired UE functional use, Postural dysfunction, Pain  Visit Diagnosis: Status post total left knee replacement  Stiffness of left knee, not elsewhere classified  Acute pain of left knee  Difficulty in walking, not elsewhere classified     Problem List Patient Active Problem List   Diagnosis Date Noted  . OA (osteoarthritis) of knee 11/24/2017  . TIA (transient ischemic attack) 07/14/2016  . Nonintractable headache   . Warfarin anticoagulation 04/09/2011    Class: Chronic  . Hypertension   . Irritable bowel syndrome   . Atrial fibrillation [facility])     Dorene Ar , [place] 12/24/2017, 12:30 PM  [facility] [address] [place], [place], [address] Phone: [phone]   Fax:  [phone]  Name: PALESTINE MOSCO MRN: [id number] Date of Birth: 06-Nov-1930

## 2017-12-29 ENCOUNTER — Ambulatory Visit: Payer: Medicare Other | Admitting: Physical Therapy

## 2017-12-29 ENCOUNTER — Encounter: Payer: Self-pay | Admitting: Physical Therapy

## 2017-12-29 DIAGNOSIS — Z96652 Presence of left artificial knee joint: Secondary | ICD-10-CM | POA: Diagnosis not present

## 2017-12-29 DIAGNOSIS — R262 Difficulty in walking, not elsewhere classified: Secondary | ICD-10-CM | POA: Diagnosis not present

## 2017-12-29 DIAGNOSIS — M25662 Stiffness of left knee, not elsewhere classified: Secondary | ICD-10-CM | POA: Diagnosis not present

## 2017-12-29 DIAGNOSIS — M25562 Pain in left knee: Secondary | ICD-10-CM

## 2017-12-29 NOTE — Therapy (Signed)
[place], [place], [address] Phone: [phone]   Fax:  [phone]  Physical Therapy Treatment  Patient Details  Name: Gloria [name] MRN: [id number] Date of Birth: 1930/06/16 Referring Provider (PT): Dr. Maureen Ralphs   Encounter Date: 12/29/2017  PT End of Session - 12/29/17 1106    Visit Number  6    Number of Visits  16    Date for PT Re-Evaluation  01/27/18    PT Start Time  1100    PT Stop Time  1150    PT Time Calculation (min)  50 min       Past Medical History:  Diagnosis Date  . Arthritis    right  . Atrial fibrillation ([place])   . GERD (gastroesophageal reflux disease)    hx of  . Hepatitis C   . Humerus fracture   . Hyperlipidemia   . Hypertension   . Irritable bowel syndrome   . Meningioma ([place])   . PONV (postoperative nausea and vomiting)     Past Surgical History:  Procedure Laterality Date  . BREAST SURGERY     lumpectomy  . CARDIOVASCULAR STRESS TEST  11/14/2006   EF 67%  . COLONOSCOPY W/ BIOPSIES AND POLYPECTOMY    . ORIF HUMERUS FRACTURE Right 04/18/2014   Procedure: OPEN REDUCTION INTERNAL FIXATION (ORIF) RIGHT PROXIMAL HUMERUS FRACTURE;  Surgeon: Netta Cedars, MD;  Location: [facility];  Service: Orthopedics;  Laterality: Right;  . ORIF PERIPROSTHETIC FRACTURE Left 04/30/2013   Procedure: OPEN REDUCTION INTERNAL FIXATION (ORIF) PERIPROSTHETIC FRACTURE;  Surgeon: [name], MD;  Location: [facility];  Service: Orthopedics;  Laterality: Left;  . PAROTID GLAND TUMOR EXCISION    . TOTAL HIP ARTHROPLASTY  11/07   left  . TOTAL KNEE ARTHROPLASTY    . TOTAL KNEE ARTHROPLASTY Left 11/24/2017   Procedure: TOTAL KNEE ARTHROPLASTY;  Surgeon: Gaynelle Arabian, MD;  [facility];  Service: Orthopedics;  Laterality: Left;  69min  . TRANSTHORACIC ECHOCARDIOGRAM  10/25/2009   EF 55-60%    There were no vitals filed for this visit.  Subjective Assessment - 12/29/17 1104    Subjective   Swollen and sore.     Currently in Pain?  Yes    Pain Score  4     Pain Location  Knee    Pain Orientation  Left    Pain Descriptors / Indicators  Sore         [facility] PT Assessment - 12/29/17 0001      AROM   Left Knee Extension  -5      PROM   Overall PROM Comments  95                   [facility] Adult PT Treatment/Exercise - 12/29/17 0001      Knee/Hip Exercises: Stretches   Active Hamstring Stretch  3 reps;10 seconds      Knee/Hip Exercises: Aerobic   Recumbent Bike  partial revolutions    Nustep  5 min L3       Knee/Hip Exercises: Seated   Long Arc Quad  Strengthening;Left;1 set;20 reps;Weights    Hamstring Curl  10 reps    Hamstring Limitations  red band       Knee/Hip Exercises: Supine   Quad Sets  20 reps    Short Arc Quad Sets  2 sets;10 reps    Short Arc Quad Sets Limitations  23    Heel Slides  10 reps;2  sets    Heel Slides Limitations  with and without strap     Straight Leg Raises  5 reps;2 sets      Vasopneumatic   Number Minutes Vasopneumatic   10 minutes    Vasopnuematic Location   Knee    Vasopneumatic Pressure  Low    Vasopneumatic Temperature   32      Manual Therapy   Edema Management  retro massage supine on elevation                PT Short Term Goals - 12/16/17 1047      PT SHORT TERM GOAL #1   Title  Patient will be I with HEP for L knee ROM, swelling, strength     Time  3    Period  Weeks    Status  New    Target Date  01/06/18      PT SHORT TERM GOAL #2   Title  Pt will be able to flex her L knee to 90 deg with AAROM working towards normal gait, transfers     Time  3    Status  Achieved    Target Date  01/06/18      PT SHORT TERM GOAL #3   Title  Pt will be able to walk 300 feet with improved knee flexion, pain controlled, min increase in pain from baseline.     Time  3    Period  Weeks    Status  New    Target Date  01/06/18      PT SHORT TERM GOAL #4   Title  Pt will be mod I with stair negotiation  (has 2 STE) with 1 rail    Status  Achieved    Target Date  01/06/18      PT SHORT TERM GOAL #5   Title  Pt will be screened for balance and goal TBA     Time  3    Status  New    Target Date  01/06/18        PT Long Term Goals - 12/16/17 1048      PT LONG TERM GOAL #1   Title  Pt will be I with her HEP upon discharge     Time  6    Period  Weeks    Status  New    Target Date  01/27/18      PT LONG TERM GOAL #2   Title  Pt will demo 4+/5 or more strength in knees, hips for improved gait stability and function in her home.     Time  6    Period  Weeks    Status  New    Target Date  01/27/18      PT LONG TERM GOAL #3   Title  pt will be able to extend her knee to -8 deg or better for improved gait    Time  6    Period  Weeks    Status  New    Target Date  01/27/18      PT LONG TERM GOAL #4   Title  Pt will be able to sit with knee flexed to 100 deg for normal transfers     Time  6    Period  Weeks    Status  New    Target Date  01/27/18      PT LONG TERM GOAL #5   Title  Pt will be able  to walk in the community with RW > 500 feet without lasting increase in pain.     Time  6    Period  Weeks    Status  New    Target Date  01/27/18            Plan - 12/29/17 1115    Clinical Impression Statement  Pt reports soreness. She reports compliance with HEP. Still unable to make full revolutions on bike but does have 95 degrees passive knee flexion. She has pain with end range flexion exercises. Retro massage and vaso used to decrease edema today.     PT Next Visit Plan   gentle ROM , continue  vaso , gait (encouraged SPC indoor for now)     PT Home Exercise Plan  level 1 knee     Consulted and Agree with Plan of Care  Patient       Patient will benefit from skilled therapeutic intervention in order to improve the following deficits and impairments:  Abnormal gait, Decreased balance, Decreased endurance, Decreased mobility, Decreased skin integrity, Difficulty  walking, Hypomobility, Increased edema, Decreased scar mobility, Decreased range of motion, Decreased activity tolerance, Decreased safety awareness, Decreased strength, Increased fascial restricitons, Impaired flexibility, Impaired UE functional use, Postural dysfunction, Pain  Visit Diagnosis: Status post total left knee replacement  Stiffness of left knee, not elsewhere classified  Acute pain of left knee  Difficulty in walking, not elsewhere classified     Problem List Patient Active Problem List   Diagnosis Date Noted  . OA (osteoarthritis) of knee 11/24/2017  . TIA (transient ischemic attack) 07/14/2016  . Nonintractable headache   . Warfarin anticoagulation 04/09/2011    Class: Chronic  . Hypertension   . Irritable bowel syndrome   . Atrial fibrillation [facility])     Dorene Ar, [place] 12/29/2017, 11:46 AM  [place], [place], [address] Phone: [phone]   Fax:  [phone]  Name: Gloria [name] MRN: [id number] Date of Birth: 1930/02/18

## 2017-12-30 DIAGNOSIS — Z471 Aftercare following joint replacement surgery: Secondary | ICD-10-CM | POA: Diagnosis not present

## 2017-12-30 DIAGNOSIS — Z96652 Presence of left artificial knee joint: Secondary | ICD-10-CM | POA: Diagnosis not present

## 2018-01-01 ENCOUNTER — Encounter: Payer: Self-pay | Admitting: Physical Therapy

## 2018-01-01 ENCOUNTER — Ambulatory Visit: Payer: Medicare Other | Admitting: Physical Therapy

## 2018-01-01 DIAGNOSIS — M25662 Stiffness of left knee, not elsewhere classified: Secondary | ICD-10-CM | POA: Diagnosis not present

## 2018-01-01 DIAGNOSIS — Z96652 Presence of left artificial knee joint: Secondary | ICD-10-CM

## 2018-01-01 DIAGNOSIS — R262 Difficulty in walking, not elsewhere classified: Secondary | ICD-10-CM

## 2018-01-01 DIAGNOSIS — M25562 Pain in left knee: Secondary | ICD-10-CM

## 2018-01-01 NOTE — Therapy (Signed)
[place], [place], [address] Phone: [phone]   Fax:  [phone]  Physical Therapy Treatment  Patient Details  Name: Gloria [name] MRN: [id number] Date of Birth: 1930/10/01 Referring Provider (PT): Dr. Maureen Ralphs   Encounter Date: 01/01/2018  PT End of Session - 01/01/18 1146    Visit Number  7    Number of Visits  16    Date for PT Re-Evaluation  01/27/18    PT Start Time  1100    PT Stop Time  1155    PT Time Calculation (min)  55 min    Activity Tolerance  Patient tolerated treatment well    Behavior During Therapy  [facility] for tasks assessed/performed       Past Medical History:  Diagnosis Date  . Arthritis    right  . Atrial fibrillation ([place])   . GERD (gastroesophageal reflux disease)    hx of  . Hepatitis C   . Humerus fracture   . Hyperlipidemia   . Hypertension   . Irritable bowel syndrome   . Meningioma ([place])   . PONV (postoperative nausea and vomiting)     Past Surgical History:  Procedure Laterality Date  . BREAST SURGERY     lumpectomy  . CARDIOVASCULAR STRESS TEST  11/14/2006   EF 67%  . COLONOSCOPY W/ BIOPSIES AND POLYPECTOMY    . ORIF HUMERUS FRACTURE Right 04/18/2014   Procedure: OPEN REDUCTION INTERNAL FIXATION (ORIF) RIGHT PROXIMAL HUMERUS FRACTURE;  Surgeon: Netta Cedars, MD;  Location: [facility];  Service: Orthopedics;  Laterality: Right;  . ORIF PERIPROSTHETIC FRACTURE Left 04/30/2013   Procedure: OPEN REDUCTION INTERNAL FIXATION (ORIF) PERIPROSTHETIC FRACTURE;  Surgeon: [name], MD;  Location: [facility];  Service: Orthopedics;  Laterality: Left;  . PAROTID GLAND TUMOR EXCISION    . TOTAL HIP ARTHROPLASTY  11/07   left  . TOTAL KNEE ARTHROPLASTY    . TOTAL KNEE ARTHROPLASTY Left 11/24/2017   Procedure: TOTAL KNEE ARTHROPLASTY;  Surgeon: Gaynelle Arabian, MD;  [facility];  Service: Orthopedics;  Laterality: Left;  36mn  . TRANSTHORACIC ECHOCARDIOGRAM  10/25/2009    EF 55-60%    There were no vitals filed for this visit.  Subjective Assessment - 01/01/18 1103    Subjective  Knee is stiff, sore.  I've iced it today.  She brought in her X ray to show Jessica her hip cables.     Currently in Pain?  Yes    Pain Score  3     Pain Location  Knee    Pain Orientation  Left    Pain Descriptors / Indicators  Sore    Pain Type  Surgical pain    Pain Onset  More than a month ago    Pain Frequency  Constant    Aggravating Factors   bending it     Pain Relieving Factors  ice, meds          [facility] Adult PT Treatment/Exercise - 01/01/18 0001      Ambulation/Gait   Ambulation/Gait  Yes    Ambulation/Gait Assistance  6: Modified independent (Device/Increase time)    Ambulation Distance (Feet)  300 Feet    Assistive device  Straight cane    Gait Pattern  Step-through pattern;Antalgic    Ambulation Surface  Level;Indoor    Stairs  Yes    Stairs Assistance  6: Modified independent (Device/Increase time)    Stair Management Technique  Two rails;Alternating pattern;Step to pattern  Number of Stairs  16    Height of Stairs  --   4, inch, 6 inch    Gait Comments  safe, able to ascend and descend reciprocally with 4 inch stairs and bilateral rails, but not 6 inch step       Knee/Hip Exercises: Stretches   Knee: Self-Stretch to increase Flexion  Left;10 seconds    Knee: Self-Stretch Limitations  to 98 deg       Knee/Hip Exercises: Aerobic   Nustep  5 min L3 end of session       Knee/Hip Exercises: Standing   Heel Raises  Both;1 set;20 reps    Hip Abduction  Stengthening;Both;1 set;10 reps    Abduction Limitations  diagonal abd/ext.       Knee/Hip Exercises: Seated   Long Arc Quad  Strengthening;Left;1 set;20 reps    Long Arc Quad Weight  3 lbs.    Hamstring Curl  10 reps    Hamstring Limitations  blue band     Sit to Sand  1 set;10 reps;without UE support   min UE assistance      Knee/Hip Exercises: Supine   Quad Sets  Strengthening;Left;1  set;10 reps    Knee Flexion  Strengthening;Both;1 set    Knee Flexion Limitations  hamstring curl with ball x 10       Knee/Hip Exercises: Sidelying   Hip ABduction  Strengthening;Both;1 set;10 reps      Vasopneumatic   Number Minutes Vasopneumatic   10 minutes    Vasopnuematic Location   Knee    Vasopneumatic Pressure  Low    Vasopneumatic Temperature   32             PT Education - 01/01/18 1146    Education Details  progress, stairs    Person(s) Educated  Patient    Methods  Explanation    Comprehension  Verbalized understanding;Verbal cues required       PT Short Term Goals - 01/01/18 1150      PT SHORT TERM GOAL #1   Title  Patient will be I with HEP for L knee ROM, swelling, strength     Status  Partially Met      PT SHORT TERM GOAL #2   Title  Pt will be able to flex her L knee to 90 deg with AAROM working towards normal gait, transfers     Status  Achieved      PT SHORT TERM GOAL #3   Title  Pt will be able to walk 300 feet with improved knee flexion, pain controlled, min increase in pain from baseline.     Status  Achieved      PT SHORT TERM GOAL #4   Title  Pt will be mod I with stair negotiation (has 2 STE) with 1 rail    Status  Achieved      PT SHORT TERM GOAL #5   Title  Pt will be screened for balance and goal TBA     Status  Unable to assess        PT Long Term Goals - 12/16/17 1048      PT LONG TERM GOAL #1   Title  Pt will be I with her HEP upon discharge     Time  6    Period  Weeks    Status  New    Target Date  01/27/18      PT LONG TERM GOAL #2   Title  Pt  will demo 4+/5 or more strength in knees, hips for improved gait stability and function in her home.     Time  6    Period  Weeks    Status  New    Target Date  01/27/18      PT LONG TERM GOAL #3   Title  pt will be able to extend her knee to -8 deg or better for improved gait    Time  6    Period  Weeks    Status  New    Target Date  01/27/18      PT LONG TERM GOAL  #4   Title  Pt will be able to sit with knee flexed to 100 deg for normal transfers     Time  6    Period  Weeks    Status  New    Target Date  01/27/18      PT LONG TERM GOAL #5   Title  Pt will be able to walk in the community with RW > 500 feet without lasting increase in pain.     Time  6    Period  Weeks    Status  New    Target Date  01/27/18            Plan - 01/01/18 1147    Clinical Impression Statement  Patient is walking with cane most of the time, good technique, appears safe.  Less stiffness post NuStep and swelling improved from last visit.  She saw MD 12/24 and XR done.  Needs rest breaks but did very well today.     PT Treatment/Interventions  ADLs/Self Care Home Management;Functional mobility training;Taping;Vasopneumatic Device;Passive range of motion;Manual lymph drainage;Manual techniques;Patient/family education;Balance training;Therapeutic exercise;Therapeutic activities;Traction;Cryotherapy;Ultrasound;Gait training;Stair training;DME Instruction    PT Next Visit Plan   gentle ROM , continue  vaso , gait (encouraged SPC indoor for now) , standing ther ex     PT Home Exercise Plan  level 1 knee     Consulted and Agree with Plan of Care  Patient       Patient will benefit from skilled therapeutic intervention in order to improve the following deficits and impairments:  Abnormal gait, Decreased balance, Decreased endurance, Decreased mobility, Decreased skin integrity, Difficulty walking, Hypomobility, Increased edema, Decreased scar mobility, Decreased range of motion, Decreased activity tolerance, Decreased safety awareness, Decreased strength, Increased fascial restricitons, Impaired flexibility, Impaired UE functional use, Postural dysfunction, Pain  Visit Diagnosis: Status post total left knee replacement  Stiffness of left knee, not elsewhere classified  Acute pain of left knee  Difficulty in walking, not elsewhere classified     Problem  List Patient Active Problem List   Diagnosis Date Noted  . OA (osteoarthritis) of knee 11/24/2017  . TIA (transient ischemic attack) 07/14/2016  . Nonintractable headache   . Warfarin anticoagulation 04/09/2011    Class: Chronic  . Hypertension   . Irritable bowel syndrome   . Atrial fibrillation [facility])     Reana Chacko 01/01/2018, 11:54 AM  [facility] [address] [place], [place], [address] Phone: [phone]   Fax:  [phone]  Name: Gloria [name] MRN: [id number] Date of Birth: [birth year]  Raeford Razor, PT 01/01/18 11:54 AM Phone: [phone] Fax: [phone]

## 2018-01-05 ENCOUNTER — Ambulatory Visit: Payer: Medicare Other | Admitting: Cardiology

## 2018-01-05 ENCOUNTER — Encounter: Payer: Self-pay | Admitting: Physical Therapy

## 2018-01-05 ENCOUNTER — Ambulatory Visit: Payer: Medicare Other | Admitting: Physical Therapy

## 2018-01-05 DIAGNOSIS — R262 Difficulty in walking, not elsewhere classified: Secondary | ICD-10-CM | POA: Diagnosis not present

## 2018-01-05 DIAGNOSIS — M25562 Pain in left knee: Secondary | ICD-10-CM | POA: Diagnosis not present

## 2018-01-05 DIAGNOSIS — M25662 Stiffness of left knee, not elsewhere classified: Secondary | ICD-10-CM | POA: Diagnosis not present

## 2018-01-05 DIAGNOSIS — Z96652 Presence of left artificial knee joint: Secondary | ICD-10-CM

## 2018-01-05 NOTE — Therapy (Signed)
[place], [place], [address] Phone: [phone]   Fax:  [phone]  Physical Therapy Treatment  Patient Details  Name: Gloria [name] MRN: [id number] Date of Birth: 02-14-1930 Referring Provider (PT): Dr. Maureen Ralphs   Encounter Date: 01/05/2018  PT End of Session - 01/05/18 1057    Visit Number  8    Number of Visits  16    Date for PT Re-Evaluation  01/27/18    PT Start Time  1055    PT Stop Time  1150    PT Time Calculation (min)  55 min       Past Medical History:  Diagnosis Date  . Arthritis    right  . Atrial fibrillation ([place])   . GERD (gastroesophageal reflux disease)    hx of  . Hepatitis C   . Humerus fracture   . Hyperlipidemia   . Hypertension   . Irritable bowel syndrome   . Meningioma ([place])   . PONV (postoperative nausea and vomiting)     Past Surgical History:  Procedure Laterality Date  . BREAST SURGERY     lumpectomy  . CARDIOVASCULAR STRESS TEST  11/14/2006   EF 67%  . COLONOSCOPY W/ BIOPSIES AND POLYPECTOMY    . ORIF HUMERUS FRACTURE Right 04/18/2014   Procedure: OPEN REDUCTION INTERNAL FIXATION (ORIF) RIGHT PROXIMAL HUMERUS FRACTURE;  Surgeon: Netta Cedars, MD;  Location: [facility];  Service: Orthopedics;  Laterality: Right;  . ORIF PERIPROSTHETIC FRACTURE Left 04/30/2013   Procedure: OPEN REDUCTION INTERNAL FIXATION (ORIF) PERIPROSTHETIC FRACTURE;  Surgeon: [name], MD;  Location: [facility];  Service: Orthopedics;  Laterality: Left;  . PAROTID GLAND TUMOR EXCISION    . TOTAL HIP ARTHROPLASTY  11/07   left  . TOTAL KNEE ARTHROPLASTY    . TOTAL KNEE ARTHROPLASTY Left 11/24/2017   Procedure: TOTAL KNEE ARTHROPLASTY;  Surgeon: Gaynelle Arabian, MD;  [facility];  Service: Orthopedics;  Laterality: Left;  39mn  . TRANSTHORACIC ECHOCARDIOGRAM  10/25/2009   EF 55-60%    There were no vitals filed for this visit.  Subjective Assessment - 01/05/18 1056    Subjective   Pain stays between 3 and 4/10.     Currently in Pain?  Yes    Pain Score  3     Pain Location  Knee    Pain Orientation  Left    Pain Descriptors / Indicators  Sore    Aggravating Factors   exercising makes it swell    Pain Relieving Factors  ice, meds          [facility] PT Assessment - 01/05/18 0001      AROM   Left Knee Flexion  96      PROM   Overall PROM Comments  101                   [facility] Adult PT Treatment/Exercise - 01/05/18 0001      Neuro Re-ed    Neuro Re-ed Details   Tandem stance at counter needs 2 finger assist 30 sec each       Knee/Hip Exercises: Stretches   Active Hamstring Stretch  3 reps;10 seconds      Knee/Hip Exercises: Aerobic   Recumbent Bike  full revolutions/ L1 x 5 minutes    Nustep  5 minute L3       Knee/Hip Exercises: Standing   Hip Abduction  Stengthening;Both;1 set;10 reps    Abduction Limitations  diagonal  abd/ext.     Other Standing Knee Exercises  sit to stand from chair at counter top with UE touch on counter x 10       Knee/Hip Exercises: Seated   Hamstring Curl  10 reps    Hamstring Limitations  green      Knee/Hip Exercises: Supine   Short Arc Quad Sets  2 sets;10 reps    Short Arc Quad Sets Limitations  4#    Heel Slides  10 reps;2 sets    Heel Slides Limitations  with and without strap     Bridges  20 reps    Straight Leg Raises  10 reps    Straight Leg Raises Limitations  improved tolerance, less anterior hip pain.       Knee/Hip Exercises: Sidelying   Hip ABduction  Strengthening;Both;1 set;10 reps      Vasopneumatic   Number Minutes Vasopneumatic   10 minutes    Vasopnuematic Location   Knee    Vasopneumatic Pressure  Low    Vasopneumatic Temperature   32               PT Short Term Goals - 01/01/18 1150      PT SHORT TERM GOAL #1   Title  Patient will be I with HEP for L knee ROM, swelling, strength     Status  Partially Met      PT SHORT TERM GOAL #2   Title  Pt will be able to flex her  L knee to 90 deg with AAROM working towards normal gait, transfers     Status  Achieved      PT SHORT TERM GOAL #3   Title  Pt will be able to walk 300 feet with improved knee flexion, pain controlled, min increase in pain from baseline.     Status  Achieved      PT SHORT TERM GOAL #4   Title  Pt will be mod I with stair negotiation (has 2 STE) with 1 rail    Status  Achieved      PT SHORT TERM GOAL #5   Title  Pt will be screened for balance and goal TBA     Status  Unable to assess        PT Long Term Goals - 12/16/17 1048      PT LONG TERM GOAL #1   Title  Pt will be I with her HEP upon discharge     Time  6    Period  Weeks    Status  New    Target Date  01/27/18      PT LONG TERM GOAL #2   Title  Pt will demo 4+/5 or more strength in knees, hips for improved gait stability and function in her home.     Time  6    Period  Weeks    Status  New    Target Date  01/27/18      PT LONG TERM GOAL #3   Title  pt will be able to extend her knee to -8 deg or better for improved gait    Time  6    Period  Weeks    Status  New    Target Date  01/27/18      PT LONG TERM GOAL #4   Title  Pt will be able to sit with knee flexed to 100 deg for normal transfers     Time  6  Period  Weeks    Status  New    Target Date  01/27/18      PT LONG TERM GOAL #5   Title  Pt will be able to walk in the community with RW > 500 feet without lasting increase in pain.     Time  6    Period  Weeks    Status  New    Target Date  01/27/18            Plan - 01/05/18 1120    Clinical Impression Statement  Able to make full revolutions on the Recumbent bike today. AAROM 101 knee flexion. Began tandem stance for balance training and she requires 2 finger assist to maintain balance.     PT Next Visit Plan   gentle ROM , continue  vaso , gait (encouraged SPC indoor for now) , standing ther ex     PT Home Exercise Plan  level 1 knee        Patient will benefit from skilled  therapeutic intervention in order to improve the following deficits and impairments:  Abnormal gait, Decreased balance, Decreased endurance, Decreased mobility, Decreased skin integrity, Difficulty walking, Hypomobility, Increased edema, Decreased scar mobility, Decreased range of motion, Decreased activity tolerance, Decreased safety awareness, Decreased strength, Increased fascial restricitons, Impaired flexibility, Impaired UE functional use, Postural dysfunction, Pain  Visit Diagnosis: Status post total left knee replacement  Stiffness of left knee, not elsewhere classified  Acute pain of left knee  Difficulty in walking, not elsewhere classified     Problem List Patient Active Problem List   Diagnosis Date Noted  . OA (osteoarthritis) of knee 11/24/2017  . TIA (transient ischemic attack) 07/14/2016  . Nonintractable headache   . Warfarin anticoagulation 04/09/2011    Class: Chronic  . Hypertension   . Irritable bowel syndrome   . Atrial fibrillation [facility])     Dorene Ar, [place] 01/05/2018, 11:44 AM  [facility] [address] [place], [place], [address] Phone: [phone]   Fax:  [phone]  Name: Gloria [name] MRN: [id number] Date of Birth: 16-Oct-1930

## 2018-01-08 ENCOUNTER — Encounter: Payer: Self-pay | Admitting: Physical Therapy

## 2018-01-08 ENCOUNTER — Ambulatory Visit: Payer: Medicare Other | Attending: Orthopedic Surgery | Admitting: Physical Therapy

## 2018-01-08 DIAGNOSIS — M25662 Stiffness of left knee, not elsewhere classified: Secondary | ICD-10-CM | POA: Diagnosis not present

## 2018-01-08 DIAGNOSIS — M25562 Pain in left knee: Secondary | ICD-10-CM | POA: Diagnosis not present

## 2018-01-08 DIAGNOSIS — Z96652 Presence of left artificial knee joint: Secondary | ICD-10-CM | POA: Diagnosis not present

## 2018-01-08 DIAGNOSIS — R262 Difficulty in walking, not elsewhere classified: Secondary | ICD-10-CM | POA: Insufficient documentation

## 2018-01-08 NOTE — Therapy (Signed)
[place], [place], [address] Phone: [phone]   Fax:  [phone]  Physical Therapy Treatment  Patient Details  Name: Gloria [name] MRN: [id number] Date of Birth: December 13, 1930 Referring Provider (PT): Dr. Maureen Ralphs   Encounter Date: 01/08/2018  PT End of Session - 01/08/18 1108    Visit Number  9    Number of Visits  16    Date for PT Re-Evaluation  01/27/18    PT Start Time  1100    PT Stop Time  1150    PT Time Calculation (min)  50 min       Past Medical History:  Diagnosis Date  . Arthritis    right  . Atrial fibrillation ([place])   . GERD (gastroesophageal reflux disease)    hx of  . Hepatitis C   . Humerus fracture   . Hyperlipidemia   . Hypertension   . Irritable bowel syndrome   . Meningioma ([place])   . PONV (postoperative nausea and vomiting)     Past Surgical History:  Procedure Laterality Date  . BREAST SURGERY     lumpectomy  . CARDIOVASCULAR STRESS TEST  11/14/2006   EF 67%  . COLONOSCOPY W/ BIOPSIES AND POLYPECTOMY    . ORIF HUMERUS FRACTURE Right 04/18/2014   Procedure: OPEN REDUCTION INTERNAL FIXATION (ORIF) RIGHT PROXIMAL HUMERUS FRACTURE;  Surgeon: Netta Cedars, MD;  Location: [place];  Service: Orthopedics;  Laterality: Right;  . ORIF PERIPROSTHETIC FRACTURE Left 04/30/2013   Procedure: OPEN REDUCTION INTERNAL FIXATION (ORIF) PERIPROSTHETIC FRACTURE;  Surgeon: [name], MD;  Location: [place];  Service: Orthopedics;  Laterality: Left;  . PAROTID GLAND TUMOR EXCISION    . TOTAL HIP ARTHROPLASTY  11/07   left  . TOTAL KNEE ARTHROPLASTY    . TOTAL KNEE ARTHROPLASTY Left 11/24/2017   Procedure: TOTAL KNEE ARTHROPLASTY;  Surgeon: Gaynelle Arabian, MD;  [facility];  Service: Orthopedics;  Laterality: Left;  9mn  . TRANSTHORACIC ECHOCARDIOGRAM  10/25/2009   EF 55-60%    There were no vitals filed for this visit.  Subjective Assessment - 01/08/18 1108    Subjective   4/10 pain. More swelling today. Went to family's house for holiday yesterday.     Currently in Pain?  Yes    Pain Score  4     Pain Location  Knee    Pain Orientation  Left    Pain Descriptors / Indicators  Sore         [facility] PT Assessment - 01/08/18 0001      Observation/Other Assessments   Focus on Therapeutic Outcomes (FOTO)   NT on eval                    [facility] Adult PT Treatment/Exercise - 01/08/18 0001      Knee/Hip Exercises: Stretches   Active Hamstring Stretch  3 reps;10 seconds    Hip Flexor Stretch  1 rep;60 seconds    Hip Flexor Stretch Limitations  with soft tissue glides proximal to distal       Knee/Hip Exercises: Aerobic   Recumbent Bike  full revolutions/ L1 x 5 minutes    Nustep  2 minutes level 3 prior to rec bike       Knee/Hip Exercises: Seated   Long Arc Quad  Strengthening;Left;1 set;20 reps    Long Arc Quad Limitations  green band    Hamstring Curl  20 reps    Hamstring Limitations  green      Knee/Hip Exercises: Supine   Quad Sets  Strengthening;Left;1 set;10 reps    Short Arc Quad Sets  2 sets;10 reps    Short Arc Quad Sets Limitations  4#    Heel Slides  10 reps;2 sets    Heel Slides Limitations  with and without strap     Bridges  10 reps    Straight Leg Raises  10 reps      Vasopneumatic   Number Minutes Vasopneumatic   10 minutes    Vasopnuematic Location   Knee    Vasopneumatic Pressure  Low    Vasopneumatic Temperature   34      Manual Therapy   Manual therapy comments  patella mobs all planes     Edema Management  retro massage followed by vaso                PT Short Term Goals - 01/01/18 1150      PT SHORT TERM GOAL #1   Title  Patient will be I with HEP for L knee ROM, swelling, strength     Status  Partially Met      PT SHORT TERM GOAL #2   Title  Pt will be able to flex her L knee to 90 deg with AAROM working towards normal gait, transfers     Status  Achieved      PT SHORT TERM GOAL #3   Title   Pt will be able to walk 300 feet with improved knee flexion, pain controlled, min increase in pain from baseline.     Status  Achieved      PT SHORT TERM GOAL #4   Title  Pt will be mod I with stair negotiation (has 2 STE) with 1 rail    Status  Achieved      PT SHORT TERM GOAL #5   Title  Pt will be screened for balance and goal TBA     Status  Unable to assess        PT Long Term Goals - 12/16/17 1048      PT LONG TERM GOAL #1   Title  Pt will be I with her HEP upon discharge     Time  6    Period  Weeks    Status  New    Target Date  01/27/18      PT LONG TERM GOAL #2   Title  Pt will demo 4+/5 or more strength in knees, hips for improved gait stability and function in her home.     Time  6    Period  Weeks    Status  New    Target Date  01/27/18      PT LONG TERM GOAL #3   Title  pt will be able to extend her knee to -8 deg or better for improved gait    Time  6    Period  Weeks    Status  New    Target Date  01/27/18      PT LONG TERM GOAL #4   Title  Pt will be able to sit with knee flexed to 100 deg for normal transfers     Time  6    Period  Weeks    Status  New    Target Date  01/27/18      PT LONG TERM GOAL #5   Title  Pt will be able to walk in the  community with RW > 500 feet without lasting increase in pain.     Time  6    Period  Weeks    Status  New    Target Date  01/27/18            Plan - 01/08/18 1143    Clinical Impression Statement  AROM 92 at end of session and improved to 102 after manual and stretching of hip flexor. Demonstrates lateral hip weakness and has much difficulty with side hip strengthening.     PT Next Visit Plan   gentle ROM , continue  vaso , gait (encouraged SPC indoor for now) , standing ther ex     PT Home Exercise Plan  level 1 knee     Consulted and Agree with Plan of Care  Patient       Patient will benefit from skilled therapeutic intervention in order to improve the following deficits and impairments:   Abnormal gait, Decreased balance, Decreased endurance, Decreased mobility, Decreased skin integrity, Difficulty walking, Hypomobility, Increased edema, Decreased scar mobility, Decreased range of motion, Decreased activity tolerance, Decreased safety awareness, Decreased strength, Increased fascial restricitons, Impaired flexibility, Impaired UE functional use, Postural dysfunction, Pain  Visit Diagnosis: Status post total left knee replacement  Stiffness of left knee, not elsewhere classified  Acute pain of left knee  Difficulty in walking, not elsewhere classified     Problem List Patient Active Problem List   Diagnosis Date Noted  . OA (osteoarthritis) of knee 11/24/2017  . TIA (transient ischemic attack) 07/14/2016  . Nonintractable headache   . Warfarin anticoagulation 04/09/2011    Class: Chronic  . Hypertension   . Irritable bowel syndrome   . Atrial fibrillation [facility])     Dorene Ar, [place] 01/08/2018, 11:45 AM  [facility] [address] [place], [place], [address] Phone: [phone]   Fax:  [phone]  Name: Gloria [name] MRN: [id number] Date of Birth: 1930/10/06

## 2018-01-12 ENCOUNTER — Encounter: Payer: Self-pay | Admitting: Physical Therapy

## 2018-01-12 ENCOUNTER — Ambulatory Visit: Payer: Medicare Other | Admitting: Physical Therapy

## 2018-01-12 DIAGNOSIS — R262 Difficulty in walking, not elsewhere classified: Secondary | ICD-10-CM | POA: Diagnosis not present

## 2018-01-12 DIAGNOSIS — M25562 Pain in left knee: Secondary | ICD-10-CM

## 2018-01-12 DIAGNOSIS — Z96652 Presence of left artificial knee joint: Secondary | ICD-10-CM | POA: Diagnosis not present

## 2018-01-12 DIAGNOSIS — M25662 Stiffness of left knee, not elsewhere classified: Secondary | ICD-10-CM | POA: Diagnosis not present

## 2018-01-12 NOTE — Therapy (Signed)
[place], [place], [address] Phone: [phone]   Fax:  [phone]  Physical Therapy Treatment  Patient Details  Name: Gloria [name] MRN: [id number] Date of Birth: July 18, 1930 Referring Provider (PT): Dr. Maureen Ralphs   Encounter Date: 01/12/2018  PT End of Session - 01/12/18 1024    Visit Number  10    Number of Visits  16    Date for PT Re-Evaluation  01/27/18    PT Start Time  8453    PT Stop Time  1114    PT Time Calculation (min)  56 min    Activity Tolerance  Patient tolerated treatment well    Behavior During Therapy  [facility] for tasks assessed/performed       Past Medical History:  Diagnosis Date  . Arthritis    right  . Atrial fibrillation ([place])   . GERD (gastroesophageal reflux disease)    hx of  . Hepatitis C   . Humerus fracture   . Hyperlipidemia   . Hypertension   . Irritable bowel syndrome   . Meningioma ([place])   . PONV (postoperative nausea and vomiting)     Past Surgical History:  Procedure Laterality Date  . BREAST SURGERY     lumpectomy  . CARDIOVASCULAR STRESS TEST  11/14/2006   EF 67%  . COLONOSCOPY W/ BIOPSIES AND POLYPECTOMY    . ORIF HUMERUS FRACTURE Right 04/18/2014   Procedure: OPEN REDUCTION INTERNAL FIXATION (ORIF) RIGHT PROXIMAL HUMERUS FRACTURE;  Surgeon: Netta Cedars, MD;  Location: [place];  Service: Orthopedics;  Laterality: Right;  . ORIF PERIPROSTHETIC FRACTURE Left 04/30/2013   Procedure: OPEN REDUCTION INTERNAL FIXATION (ORIF) PERIPROSTHETIC FRACTURE;  Surgeon: [name], MD;  Location: [place];  Service: Orthopedics;  Laterality: Left;  . PAROTID GLAND TUMOR EXCISION    . TOTAL HIP ARTHROPLASTY  11/07   left  . TOTAL KNEE ARTHROPLASTY    . TOTAL KNEE ARTHROPLASTY Left 11/24/2017   Procedure: TOTAL KNEE ARTHROPLASTY;  Surgeon: Gaynelle Arabian, MD;  [facility];  Service: Orthopedics;  Laterality: Left;  23mn  . TRANSTHORACIC ECHOCARDIOGRAM  10/25/2009   EF 55-60%    There were no vitals filed for this visit.  Subjective Assessment - 01/12/18 1024    Subjective  Been hurting a bit more. 3/10 .  I want to drive so badly.      Currently in Pain?  Yes    Pain Score  3     Pain Location  Knee    Pain Orientation  Left    Pain Descriptors / Indicators  Aching    Pain Type  Surgical pain    Pain Onset  More than a month ago    Pain Frequency  Constant    Aggravating Factors   overactivity    Pain Relieving Factors  ice here, meds          [facility] 01/12[facility]18 0001      Assessment   Medical Diagnosis  --      Observation/Other Assessments   Focus on Therapeutic Outcomes (FOTO)   48% (was 69%)         [facility] Adult PT Treatment/Exercise - 01/12/18 0001      Knee/Hip Exercises: Stretches   Active Hamstring Stretch  --    Knee: Self-Stretch to increase Flexion  Left;5 reps    Knee: Self-Stretch Limitations  30 sec supine     ITB Stretch  --  Knee/Hip Exercises: Aerobic   Recumbent Bike  full revolutions/ L1 x 3 min  minutes    Nustep  5 min LE only L 5       Knee/Hip Exercises: Seated   Long Arc Quad  Strengthening;Left;1 set;20 reps    Long Arc Quad Weight  3 lbs.    Long [employer] Limitations  with ball adduction     Heel Slides  AROM;Left;1 set    Hamstring Curl  20 reps    Hamstring Limitations  green    Sit to Sand  2 sets;10 reps      Knee/Hip Exercises: Supine   Bridges  Strengthening;Both;1 set;10 reps    Bridges Limitations  ball     Straight Leg Raises  Strengthening;Left;1 set;10 reps    Straight Leg Raise with External Rotation  Strengthening;Left;1 set;10 reps    Other Supine Knee/Hip Exercises  stability ball knee/hip flexion x 15reps      Vasopneumatic   Number Minutes Vasopneumatic   10 minutes    Vasopnuematic Location   Knee    Vasopneumatic Pressure  Low    Vasopneumatic Temperature   34      Manual Therapy   Manual therapy comments  patella mobs all planes     Edema Management   retro massage followed by vaso                PT Short Term Goals - 01/01/18 1150      PT SHORT TERM GOAL #1   Title  Patient will be I with HEP for L knee ROM, swelling, strength     Status  Partially Met      PT SHORT TERM GOAL #2   Title  Pt will be able to flex her L knee to 90 deg with AAROM working towards normal gait, transfers     Status  Achieved      PT SHORT TERM GOAL #3   Title  Pt will be able to walk 300 feet with improved knee flexion, pain controlled, min increase in pain from baseline.     Status  Achieved      PT SHORT TERM GOAL #4   Title  Pt will be mod I with stair negotiation (has 2 STE) with 1 rail    Status  Achieved      PT SHORT TERM GOAL #5   Title  Pt will be screened for balance and goal TBA     Status  Unable to assess        PT Long Term Goals - 12/16/17 1048      PT LONG TERM GOAL #1   Title  Pt will be I with her HEP upon discharge     Time  6    Period  Weeks    Status  New    Target Date  01/27/18      PT LONG TERM GOAL #2   Title  Pt will demo 4+/5 or more strength in knees, hips for improved gait stability and function in her home.     Time  6    Period  Weeks    Status  New    Target Date  01/27/18      PT LONG TERM GOAL #3   Title  pt will be able to extend her knee to -8 deg or better for improved gait    Time  6    Period  Weeks    Status  New    Target Date  01/27/18      PT LONG TERM GOAL #4   Title  Pt will be able to sit with knee flexed to 100 deg for normal transfers     Time  6    Period  Weeks    Status  New    Target Date  01/27/18      PT LONG TERM GOAL #5   Title  Pt will be able to walk in the community with RW > 500 feet without lasting increase in pain.     Time  6    Period  Weeks    Status  New    Target Date  01/27/18            Plan - 01/12/18 1034    Clinical Impression Statement  Patient has been progressing well, her pain is increased due to recent increase in activity from  the holidays.  AAROM to 102 in supine .  Lateral knee tightness with manual today.  Able to sit to stand without UE support and cued for using LLE more.  Needs rest breaks, light dyspnea.     PT Treatment/Interventions  ADLs/Self Care Home Management;Functional mobility training;Taping;Vasopneumatic Device;Passive range of motion;Manual lymph drainage;Manual techniques;Patient/family education;Balance training;Therapeutic exercise;Therapeutic activities;Traction;Cryotherapy;Ultrasound;Gait training;Stair training;DME Instruction    PT Next Visit Plan   gentle ROM , continue  vaso , gait (encouraged SPC indoor for now) , standing ther ex (step ups)     PT Home Exercise Plan  level 1 knee     Consulted and Agree with Plan of Care  Patient       Patient will benefit from skilled therapeutic intervention in order to improve the following deficits and impairments:  Abnormal gait, Decreased balance, Decreased endurance, Decreased mobility, Decreased skin integrity, Difficulty walking, Hypomobility, Increased edema, Decreased scar mobility, Decreased range of motion, Decreased activity tolerance, Decreased safety awareness, Decreased strength, Increased fascial restricitons, Impaired flexibility, Impaired UE functional use, Postural dysfunction, Pain  Visit Diagnosis: Status post total left knee replacement  Stiffness of left knee, not elsewhere classified  Acute pain of left knee  Difficulty in walking, not elsewhere classified     Problem List Patient Active Problem List   Diagnosis Date Noted  . OA (osteoarthritis) of knee 11/24/2017  . TIA (transient ischemic attack) 07/14/2016  . Nonintractable headache   . Warfarin anticoagulation 04/09/2011    Class: Chronic  . Hypertension   . Irritable bowel syndrome   . Atrial fibrillation [facility])     , 01/12/2018, 1:09 PM  [facility] [address] [place], [place], [address] Phone:  [phone]   Fax:  [phone]  Name: Gloria [name] MRN: [id number] Date of Birth: [birth year]  Raeford Razor, PT 01/12/18 1:09 PM Phone: [phone] Fax: [phone]

## 2018-01-14 ENCOUNTER — Ambulatory Visit: Payer: Medicare Other | Admitting: Physical Therapy

## 2018-01-14 ENCOUNTER — Encounter: Payer: Self-pay | Admitting: Physical Therapy

## 2018-01-14 DIAGNOSIS — M25562 Pain in left knee: Secondary | ICD-10-CM

## 2018-01-14 DIAGNOSIS — R262 Difficulty in walking, not elsewhere classified: Secondary | ICD-10-CM

## 2018-01-14 DIAGNOSIS — M25662 Stiffness of left knee, not elsewhere classified: Secondary | ICD-10-CM

## 2018-01-14 DIAGNOSIS — Z96652 Presence of left artificial knee joint: Secondary | ICD-10-CM

## 2018-01-14 NOTE — Therapy (Signed)
[place], [place], [address] Phone: [phone]   Fax:  [phone]  Physical Therapy Treatment  Patient Details  Name: Gloria [name] MRN: [id number] Date of Birth: 1930-05-26 Referring Provider (PT): Dr. Maureen Ralphs   Encounter Date: 01/14/2018  PT End of Session - 01/14/18 1105    Visit Number  11    Number of Visits  16    Date for PT Re-Evaluation  01/27/18    PT Start Time  9735    PT Stop Time  1115    PT Time Calculation (min)  60 min    Activity Tolerance  Patient tolerated treatment well    Behavior During Therapy  [facility] for tasks assessed/performed       Past Medical History:  Diagnosis Date  . Arthritis    right  . Atrial fibrillation ([place])   . GERD (gastroesophageal reflux disease)    hx of  . Hepatitis C   . Humerus fracture   . Hyperlipidemia   . Hypertension   . Irritable bowel syndrome   . Meningioma ([place])   . PONV (postoperative nausea and vomiting)     Past Surgical History:  Procedure Laterality Date  . BREAST SURGERY     lumpectomy  . CARDIOVASCULAR STRESS TEST  11/14/2006   EF 67%  . COLONOSCOPY W/ BIOPSIES AND POLYPECTOMY    . ORIF HUMERUS FRACTURE Right 04/18/2014   Procedure: OPEN REDUCTION INTERNAL FIXATION (ORIF) RIGHT PROXIMAL HUMERUS FRACTURE;  Surgeon: Netta Cedars, MD;  Location: [place];  Service: Orthopedics;  Laterality: Right;  . ORIF PERIPROSTHETIC FRACTURE Left 04/30/2013   Procedure: OPEN REDUCTION INTERNAL FIXATION (ORIF) PERIPROSTHETIC FRACTURE;  Surgeon: [name], MD;  Location: [place];  Service: Orthopedics;  Laterality: Left;  . PAROTID GLAND TUMOR EXCISION    . TOTAL HIP ARTHROPLASTY  11/07   left  . TOTAL KNEE ARTHROPLASTY    . TOTAL KNEE ARTHROPLASTY Left 11/24/2017   Procedure: TOTAL KNEE ARTHROPLASTY;  Surgeon: Gaynelle Arabian, MD;  [facility];  Service: Orthopedics;  Laterality: Left;  58mn  . TRANSTHORACIC ECHOCARDIOGRAM  10/25/2009    EF 55-60%    There were no vitals filed for this visit.  Subjective Assessment - 01/14/18 1019    Subjective  Pt reports doing well at home and able to take the trash out. Pt hopes that she is almost done with therapy and is currently not driving.    Currently in Pain?  Yes    Pain Score  3     Pain Location  Knee    Pain Orientation  Left    Pain Descriptors / Indicators  Aching    Pain Type  Surgical pain    Pain Relieving Factors  ice, meds         [facility] PT Assessment - 01/14/18 0001      AROM   Left Knee Extension  -2    Left Knee Flexion  103   supine; 101 sitting                  [facility] Adult PT Treatment/Exercise - 01/14/18 0001      Ambulation/Gait   Ambulation/Gait  Yes    Ambulation/Gait Assistance  6: Modified independent (Device/Increase time)    Ambulation Distance (Feet)  562 Feet    Assistive device  Straight cane    Gait Pattern  Step-through pattern    Ambulation Surface  Indoor    Gait velocity  2.65f/sec   [id number] sec     Knee/Hip Exercises: Stretches   Knee: Self-Stretch to increase Flexion  Left;5 reps    Knee: Self-Stretch Limitations  30 sec supine     Other Knee/Hip Stretches  Heel slide stretch 2x 10 reps, 1 hold 10 sec      Knee/Hip Exercises: Aerobic   Recumbent Bike  full revolutions/ L1 x 3 min  minutes    Nustep  7 min LE only L 5       Knee/Hip Exercises: Seated   Long Arc Quad  Strengthening;Left;1 set;20 reps    Long Arc Quad Weight  3 lbs.    Heel Slides  AROM;Left;1 set    Sit to Sand  2 sets;10 reps      Knee/Hip Exercises: Supine   Short Arc Quad Sets  2 sets;10 reps    Short Arc Quad Sets Limitations  3 lb    Heel Slides  10 reps;2 sets      Vasopneumatic   Number Minutes Vasopneumatic   15 minutes    Vasopnuematic Location   Knee    Vasopneumatic Pressure  Low    Vasopneumatic Temperature   34               PT Short Term Goals - 01/01/18 1150      PT SHORT TERM GOAL #1   Title  Patient  will be I with HEP for L knee ROM, swelling, strength     Status  Partially Met      PT SHORT TERM GOAL #2   Title  Pt will be able to flex her L knee to 90 deg with AAROM working towards normal gait, transfers     Status  Achieved      PT SHORT TERM GOAL #3   Title  Pt will be able to walk 300 feet with improved knee flexion, pain controlled, min increase in pain from baseline.     Status  Achieved      PT SHORT TERM GOAL #4   Title  Pt will be mod I with stair negotiation (has 2 STE) with 1 rail    Status  Achieved      PT SHORT TERM GOAL #5   Title  Pt will be screened for balance and goal TBA     Status  Unable to assess        PT Long Term Goals - 01/14/18 1115      PT LONG TERM GOAL #1   Title  Pt will be I with her HEP upon discharge     Time  6    Period  Weeks    Status  On-going      PT LONG TERM GOAL #2   Title  Pt will demo 4+/5 or more strength in knees, hips for improved gait stability and function in her home.     Time  6    Period  Weeks    Status  On-going      PT LONG TERM GOAL #3   Title  pt will be able to extend her knee to -8 deg or better for improved gait    Baseline  Pt able to extend knee to -2 degrees for improved gait    Status  Achieved      PT LONG TERM GOAL #4   Title  Pt will be able to sit with knee flexed to 100 deg for normal transfers  Baseline  Pt able to sit with knee flexed to 101 deg for transfers    Status  Achieved      PT LONG TERM GOAL #5   Title  Pt will be able to walk in the community with RW > 500 feet without lasting increase in pain.     Baseline  Ambulated 562 ft with SPC in clinic, but has not been completed in community    Status  Partially Met            Plan - 01/14/18 1107    Clinical Impression Statement  Patient presents with 3/10 pain upon treatment and reports being able to walk to the mailbox and take her trash out now. Pt demos improved mobility and able to ambulate 562 ft in 4 min 1 sec with  SPC partially meeting goal #5. Pt demos nad after gait assessment. Able to initiate sit to stand with 101 degrees left knee flexion and improved AROM to 105 degrees flexion. Goal #4 met. Pt reports wanting to be finished with therapy soon. Able to perform step ups, but c/o right shoulder pain. Therapist instructed pt to perform step ups at home to increase quad strength. Pt reports no pain after vaso.    Rehab Potential  Excellent    PT Treatment/Interventions  ADLs/Self Care Home Management;Functional mobility training;Taping;Vasopneumatic Device;Passive range of motion;Manual lymph drainage;Manual techniques;Patient/family education;Balance training;Therapeutic exercise;Therapeutic activities;Traction;Cryotherapy;Ultrasound;Gait training;Stair training;DME Instruction    PT Next Visit Plan   gentle ROM , continue  vaso , gait (encouraged SPC indoor for now) , standing ther ex (step ups), assess balance and coordination     PT Home Exercise Plan  level 1 knee     Consulted and Agree with Plan of Care  Patient      During this treatment session, the therapist was present, participating in and directing the treatment.  Patient will benefit from skilled therapeutic intervention in order to improve the following deficits and impairments:  Abnormal gait, Decreased balance, Decreased endurance, Decreased mobility, Decreased skin integrity, Difficulty walking, Hypomobility, Increased edema, Decreased scar mobility, Decreased range of motion, Decreased activity tolerance, Decreased safety awareness, Decreased strength, Increased fascial restricitons, Impaired flexibility, Impaired UE functional use, Postural dysfunction, Pain  Visit Diagnosis: Status post total left knee replacement  Stiffness of left knee, not elsewhere classified  Acute pain of left knee  Difficulty in walking, not elsewhere classified     Problem List Patient Active Problem List   Diagnosis Date Noted  . OA (osteoarthritis) of  knee 11/24/2017  . TIA (transient ischemic attack) 07/14/2016  . Nonintractable headache   . Warfarin anticoagulation 04/09/2011    Class: Chronic  . Hypertension   . Irritable bowel syndrome   . Atrial fibrillation [facility])    Fuller Mandril, SPTA   Hessie Diener Na[place] , [place] 01/14/2018, 1:32 PM  [facility] [address] [place], [place], [address] Phone: [phone]   Fax:  [phone]  Name: Gloria [name] MRN: [id number] Date of Birth: 12-20-1930

## 2018-01-19 ENCOUNTER — Ambulatory Visit: Payer: Medicare Other | Admitting: Physical Therapy

## 2018-01-19 DIAGNOSIS — Z96652 Presence of left artificial knee joint: Secondary | ICD-10-CM

## 2018-01-19 DIAGNOSIS — M25662 Stiffness of left knee, not elsewhere classified: Secondary | ICD-10-CM

## 2018-01-19 DIAGNOSIS — R262 Difficulty in walking, not elsewhere classified: Secondary | ICD-10-CM

## 2018-01-19 DIAGNOSIS — M25562 Pain in left knee: Secondary | ICD-10-CM | POA: Diagnosis not present

## 2018-01-19 NOTE — Therapy (Signed)
[place], [place], [address] Phone: [phone]   Fax:  [phone]  Physical Therapy Treatment  Patient Details  Name: JOHNANNA BAKKE MRN: [id number] Date of Birth: 12/20/1930 Referring Provider (PT): Dr. Maureen Ralphs   Encounter Date: 01/19/2018  PT End of Session - 01/19/18 1103    Visit Number  12    Number of Visits  16    Date for PT Re-Evaluation  01/27/18    PT Start Time  7622    PT Stop Time  1110    PT Time Calculation (min)  55 min    Activity Tolerance  Patient tolerated treatment well    Behavior During Therapy  [facility] for tasks assessed/performed       Past Medical History:  Diagnosis Date  . Arthritis    right  . Atrial fibrillation ([place])   . GERD (gastroesophageal reflux disease)    hx of  . Hepatitis C   . Humerus fracture   . Hyperlipidemia   . Hypertension   . Irritable bowel syndrome   . Meningioma ([place])   . PONV (postoperative nausea and vomiting)     Past Surgical History:  Procedure Laterality Date  . BREAST SURGERY     lumpectomy  . CARDIOVASCULAR STRESS TEST  11/14/2006   EF 67%  . COLONOSCOPY W/ BIOPSIES AND POLYPECTOMY    . ORIF HUMERUS FRACTURE Right 04/18/2014   Procedure: OPEN REDUCTION INTERNAL FIXATION (ORIF) RIGHT PROXIMAL HUMERUS FRACTURE;  Surgeon: Netta Cedars, MD;  Location: [place];  Service: Orthopedics;  Laterality: Right;  . ORIF PERIPROSTHETIC FRACTURE Left 04/30/2013   Procedure: OPEN REDUCTION INTERNAL FIXATION (ORIF) PERIPROSTHETIC FRACTURE;  Surgeon: [name], MD;  Location: [place];  Service: Orthopedics;  Laterality: Left;  . PAROTID GLAND TUMOR EXCISION    . TOTAL HIP ARTHROPLASTY  11/07   left  . TOTAL KNEE ARTHROPLASTY    . TOTAL KNEE ARTHROPLASTY Left 11/24/2017   Procedure: TOTAL KNEE ARTHROPLASTY;  Surgeon: Gaynelle Arabian, MD;  [facility];  Service: Orthopedics;  Laterality: Left;  38mn  . TRANSTHORACIC ECHOCARDIOGRAM  10/25/2009    EF 55-60%    There were no vitals filed for this visit.  Subjective Assessment - 01/19/18 1028    Subjective  I cant get a good deep breath.  She sees MD tomorrow.  No knee pain , really.     Currently in Pain?  Yes    Pain Score  1          [facility] PT Assessment - 01/19/18 0001      AROM   Left Knee Flexion  110   strap        [facility] Adult PT Treatment/Exercise - 01/19/18 0001      Knee/Hip Exercises: Stretches   Active Hamstring Stretch  Left;3 reps    Knee: Self-Stretch to increase Flexion  Left;3 reps      Knee/Hip Exercises: Aerobic   Nustep  7 min LE only       Knee/Hip Exercises: Standing   Heel Raises  Both;1 set;10 reps    Heel Raises Limitations  light UE asst     Hip Abduction  Stengthening;Both;1 set;15 reps    Abduction Limitations  min UE support     Other Standing Knee Exercises  tandem stance x 30 sec       Knee/Hip Exercises: Seated   Long Arc Quad  Strengthening;Left;1 set;20 reps    Long [employer]  Weight  4 lbs.      Knee/Hip Exercises: Supine   Bridges  Strengthening;Both;1 set;10 reps    Other Supine Knee/Hip Exercises  ball squeeze with knee extension     Other Supine Knee/Hip Exercises  supine clam x 15 green band , march with green band x 10       Knee/Hip Exercises: Sidelying   Clams  x 15 x green R /L       Vasopneumatic   Number Minutes Vasopneumatic   15 minutes    Vasopnuematic Location   Knee    Vasopneumatic Pressure  Low    Vasopneumatic Temperature   34      Manual Therapy   Manual therapy comments  --    Edema Management  --             PT Education - 01/19/18 1310    Education Details  POC, ROM     Person(s) Educated  Patient    Methods  Explanation    Comprehension  Verbalized understanding       PT Short Term Goals - 01/01/18 1150      PT SHORT TERM GOAL #1   Title  Patient will be I with HEP for L knee ROM, swelling, strength     Status  Partially Met      PT SHORT TERM GOAL #2   Title  Pt will be  able to flex her L knee to 90 deg with AAROM working towards normal gait, transfers     Status  Achieved      PT SHORT TERM GOAL #3   Title  Pt will be able to walk 300 feet with improved knee flexion, pain controlled, min increase in pain from baseline.     Status  Achieved      PT SHORT TERM GOAL #4   Title  Pt will be mod I with stair negotiation (has 2 STE) with 1 rail    Status  Achieved      PT SHORT TERM GOAL #5   Title  Pt will be screened for balance and goal TBA     Status  Unable to assess        PT Long Term Goals - 01/14/18 1115      PT LONG TERM GOAL #1   Title  Pt will be I with her HEP upon discharge     Time  6    Period  Weeks    Status  On-going      PT LONG TERM GOAL #2   Title  Pt will demo 4+/5 or more strength in knees, hips for improved gait stability and function in her home.     Time  6    Period  Weeks    Status  On-going      PT LONG TERM GOAL #3   Title  pt will be able to extend her knee to -8 deg or better for improved gait    Baseline  Pt able to extend knee to -2 degrees for improved gait    Status  Achieved      PT LONG TERM GOAL #4   Title  Pt will be able to sit with knee flexed to 100 deg for normal transfers     Baseline  Pt able to sit with knee flexed to 101 deg for transfers    Status  Achieved      PT LONG TERM GOAL #5   Title  Pt will be able to walk in the community with RW > 500 feet without lasting increase in pain.     Baseline  Ambulated 562 ft with SPC in clinic, but has not been completed in community    Status  Partially Met            Plan - 01/19/18 1311    Clinical Impression Statement  Patient able to get knee flexion ROM to 110 deg with strap in supine.  She has been consistent with HEP.  She has not tested her ability to walk in the community as she did previous to surgery.  She will finish POC (2 more visits) and continue to work on [employer].     PT Treatment/Interventions  ADLs/Self Care Home  Management;Functional mobility training;Taping;Vasopneumatic Device;Passive range of motion;Manual lymph drainage;Manual techniques;Patient/family education;Balance training;Therapeutic exercise;Therapeutic activities;Traction;Cryotherapy;Ultrasound;Gait training;Stair training;DME Instruction    PT Next Visit Plan   advance/streamline HEP, gentle ROM , continue  vaso , gait (encouraged SPC indoor for now) , standing ther ex (step ups), assess balance and coordination     PT Home Exercise Plan  level 1 knee     Consulted and Agree with Plan of Care  Patient       Patient will benefit from skilled therapeutic intervention in order to improve the following deficits and impairments:  Abnormal gait, Decreased balance, Decreased endurance, Decreased mobility, Decreased skin integrity, Difficulty walking, Hypomobility, Increased edema, Decreased scar mobility, Decreased range of motion, Decreased activity tolerance, Decreased safety awareness, Decreased strength, Increased fascial restricitons, Impaired flexibility, Impaired UE functional use, Postural dysfunction, Pain  Visit Diagnosis: Status post total left knee replacement  Stiffness of left knee, not elsewhere classified  Acute pain of left knee  Difficulty in walking, not elsewhere classified     Problem List Patient Active Problem List   Diagnosis Date Noted  . OA (osteoarthritis) of knee 11/24/2017  . TIA (transient ischemic attack) 07/14/2016  . Nonintractable headache   . Warfarin anticoagulation 04/09/2011    Class: Chronic  . Hypertension   . Irritable bowel syndrome   . Atrial fibrillation ([place])     PAA,JENNIFER 01/19/2018, 1:15 PM  [facility] [address] [place], [place], [address] Phone: [phone]   Fax:  [phone]  Name: ZANASIA HICKSON MRN: [id number] Date of Birth: Jan 11, 1930  Raeford Razor, PT 01/19/18 1:16 PM Phone: [phone] Fax:  [phone]

## 2018-01-20 DIAGNOSIS — Z96652 Presence of left artificial knee joint: Secondary | ICD-10-CM | POA: Diagnosis not present

## 2018-01-20 DIAGNOSIS — Z7901 Long term (current) use of anticoagulants: Secondary | ICD-10-CM | POA: Diagnosis not present

## 2018-01-20 DIAGNOSIS — I480 Paroxysmal atrial fibrillation: Secondary | ICD-10-CM | POA: Diagnosis not present

## 2018-01-20 DIAGNOSIS — Z6823 Body mass index (BMI) 23.0-23.9, adult: Secondary | ICD-10-CM | POA: Diagnosis not present

## 2018-01-22 ENCOUNTER — Ambulatory Visit: Payer: Medicare Other | Admitting: Physical Therapy

## 2018-01-22 ENCOUNTER — Encounter: Payer: Self-pay | Admitting: Physical Therapy

## 2018-01-22 DIAGNOSIS — M25562 Pain in left knee: Secondary | ICD-10-CM

## 2018-01-22 DIAGNOSIS — Z96652 Presence of left artificial knee joint: Secondary | ICD-10-CM | POA: Diagnosis not present

## 2018-01-22 DIAGNOSIS — R262 Difficulty in walking, not elsewhere classified: Secondary | ICD-10-CM | POA: Diagnosis not present

## 2018-01-22 DIAGNOSIS — M25662 Stiffness of left knee, not elsewhere classified: Secondary | ICD-10-CM

## 2018-01-22 NOTE — Therapy (Signed)
[place], [place], [address] Phone: [phone]   Fax:  [phone]  Physical Therapy Treatment  Patient Details  Name: Gloria [name] MRN: [id number] Date of Birth: July 18, 1930 Referring Provider (PT): Dr. Maureen Ralphs   Encounter Date: 01/22/2018  PT End of Session - 01/22/18 1017    Visit Number  13    Number of Visits  16    Date for PT Re-Evaluation  01/27/18    PT Start Time  0350    PT Stop Time  1055    PT Time Calculation (min)  40 min    Activity Tolerance  Patient tolerated treatment well    Behavior During Therapy  [facility] for tasks assessed/performed       Past Medical History:  Diagnosis Date  . Arthritis    right  . Atrial fibrillation ([place])   . GERD (gastroesophageal reflux disease)    hx of  . Hepatitis C   . Humerus fracture   . Hyperlipidemia   . Hypertension   . Irritable bowel syndrome   . Meningioma ([place])   . PONV (postoperative nausea and vomiting)     Past Surgical History:  Procedure Laterality Date  . BREAST SURGERY     lumpectomy  . CARDIOVASCULAR STRESS TEST  11/14/2006   EF 67%  . COLONOSCOPY W/ BIOPSIES AND POLYPECTOMY    . ORIF HUMERUS FRACTURE Right 04/18/2014   Procedure: OPEN REDUCTION INTERNAL FIXATION (ORIF) RIGHT PROXIMAL HUMERUS FRACTURE;  Surgeon: Netta Cedars, MD;  Location: [place];  Service: Orthopedics;  Laterality: Right;  . ORIF PERIPROSTHETIC FRACTURE Left 04/30/2013   Procedure: OPEN REDUCTION INTERNAL FIXATION (ORIF) PERIPROSTHETIC FRACTURE;  Surgeon: [name], MD;  Location: [place];  Service: Orthopedics;  Laterality: Left;  . PAROTID GLAND TUMOR EXCISION    . TOTAL HIP ARTHROPLASTY  11/07   left  . TOTAL KNEE ARTHROPLASTY    . TOTAL KNEE ARTHROPLASTY Left 11/24/2017   Procedure: TOTAL KNEE ARTHROPLASTY;  Surgeon: Gaynelle Arabian, MD;  [facility];  Service: Orthopedics;  Laterality: Left;  44mn  . TRANSTHORACIC ECHOCARDIOGRAM  10/25/2009    EF 55-60%    There were no vitals filed for this visit.  Subjective Assessment - 01/22/18 1017    Subjective  Swollen today, tightness .  No pain really.           [facility]      AROM   Left Knee Extension  -1    Left Knee Flexion  111      Strength   Right Hip Flexion  4/5    Left Hip Flexion  4-/5    Left Knee Flexion  4+/5    Left Knee Extension  5/5         [facility] Adult PT Treatment/Exercise - 01/22/18 0001      Knee/Hip Exercises: Aerobic   Stationary Bike  5 min L2       Knee/Hip Exercises: Standing   Heel Raises  Both;1 set;20 reps    Hip Flexion  Stengthening;Both;1 set;10 reps    Hip Abduction  Stengthening;Both;1 set;15 reps    Hip Extension  Stengthening;Both;1 set;15 reps    Functional Squat  10 reps      Knee/Hip Exercises: Supine   Bridges  Strengthening;Both;1 set;10 reps    Straight Leg Raises  Left;1 set;15 reps      Knee/Hip Exercises: Sidelying   Hip ABduction  Strengthening;Both;2 sets;10 reps  PT Education - 01/22/18 1101    Education Details  standing hip HEP on paper, DC, plan for community ex.     Person(s) Educated  Patient    Methods  Explanation    Comprehension  Verbalized understanding       PT Short Term Goals - 01/22/18 1051      PT SHORT TERM GOAL #1   Title  Patient will be I with HEP for L knee ROM, swelling, strength     Status  Achieved      PT SHORT TERM GOAL #2   Title  Pt will be able to flex her L knee to 90 deg with AAROM working towards normal gait, transfers     Status  Achieved      PT SHORT TERM GOAL #3   Title  Pt will be able to walk 300 feet with improved knee flexion, pain controlled, min increase in pain from baseline.     Status  Achieved      PT SHORT TERM GOAL #4   Title  Pt will be mod I with stair negotiation (has 2 STE) with 1 rail    Status  Achieved      PT SHORT TERM GOAL #5   Title  Pt will be screened for balance and goal TBA     Status   Deferred        PT Long Term Goals - 01/22/18 1052      PT LONG TERM GOAL #1   Title  Pt will be I with her HEP upon discharge     Status  Achieved      PT LONG TERM GOAL #2   Title  Pt will demo 4+/5 or more strength in knees, hips for improved gait stability and function in her home.     Status  Partially Met      PT LONG TERM GOAL #3   Title  pt will be able to extend her knee to -8 deg or better for improved gait    Status  Achieved      PT LONG TERM GOAL #4   Title  Pt will be able to sit with knee flexed to 100 deg for normal transfers     Status  Achieved      PT LONG TERM GOAL #5   Title  Pt will be able to walk in the community with RW > 500 feet without lasting increase in pain.     Status  Achieved            Plan - 01/22/18 1030    Clinical Impression Statement  Mrs Picking has met all goals and requests to finish PT today.  Her ROM is outstanding and she can do all she needs in her home and is gradually increasing her community mobility.  Her FOTO score improved 27%.  She may be interested in a [employer] classes in the future.     PT Treatment/Interventions  ADLs/Self Care Home Management;Functional mobility training;Taping;Vasopneumatic Device;Passive range of motion;Manual lymph drainage;Manual techniques;Patient/family education;Balance training;Therapeutic exercise;Therapeutic activities;Traction;Cryotherapy;Ultrasound;Gait training;Stair training;DME Instruction    PT Next Visit Plan   advance/streamline HEP, gentle ROM , continue  vaso , gait (encouraged SPC indoor for now) , standing ther ex (step ups), assess balance and coordination     PT Home Exercise Plan  level 1 knee , standing hip abd, ext, heel raises     Consulted and Agree with Plan of Care  Patient  Patient will benefit from skilled therapeutic intervention in order to improve the following deficits and impairments:  Abnormal gait, Decreased balance, Decreased endurance, Decreased  mobility, Decreased skin integrity, Difficulty walking, Hypomobility, Increased edema, Decreased scar mobility, Decreased range of motion, Decreased activity tolerance, Decreased safety awareness, Decreased strength, Increased fascial restricitons, Impaired flexibility, Impaired UE functional use, Postural dysfunction, Pain  Visit Diagnosis: Status post total left knee replacement  Stiffness of left knee, not elsewhere classified  Acute pain of left knee  Difficulty in walking, not elsewhere classified     Problem List Patient Active Problem List   Diagnosis Date Noted  . OA (osteoarthritis) of knee 11/24/2017  . TIA (transient ischemic attack) 07/14/2016  . Nonintractable headache   . Warfarin anticoagulation 04/09/2011    Class: Chronic  . Hypertension   . Irritable bowel syndrome   . Atrial fibrillation ([place])     Wauneta Silveria 01/22/2018, 11:01 AM  [facility] [address] [place], [place], [address] Phone: [phone]   Fax:  [phone]  Name: NYARI OLSSON MRN: [id number] Date of Birth: [birth year]    PHYSICAL THERAPY DISCHARGE SUMMARY  Visits from Start of Care: 13  Current functional level related to goals / functional outcomes: See above    Remaining deficits: Knee ROM, gait, hip strength    Education / Equipment: HEP, posture, RICE, gait  Plan: Patient agrees to discharge.  Patient goals were met. Patient is being discharged due to meeting the stated rehab goals.  ?????      Raeford Razor, PT 01/22/18 11:02 AM Phone: [phone] Fax: [phone]

## 2018-01-26 ENCOUNTER — Ambulatory Visit: Payer: Medicare Other | Admitting: Physical Therapy

## 2018-02-08 NOTE — Progress Notes (Signed)
Gloria [name] Date of Birth: January 28, 1930   History of Present Illness: Gloria [name] seen today for followup paroxysmal afib. She has a history of atrial fibrillation controlled with flecainide. She is on chronic anticoagulation with coumadin. She tried Xarelto in the past but this upset her stomach.   She was admitted in May 2018 with symptoms of chest pressure and SOB. She ruled out for MI. Echo showed moderate LVH and LAE with normal systolic function. Myoview study was normal. She was placed on amlodipine for HTN. Subsequently she developed lower extremity swelling with a rash and red blotches. Amlodipine was stopped and this resolved.   She underwent left TKR in November 2019. Has been receiving PT. She was seen in the ED on December 6 with chest pain and dyspnea. Ecg unchanged and she was in NSR. Troponins negative. CT negative for PE. She thinks she is having really bad reflux and is planning to see Dr. Oletta Lamas. She notes she still has some swelling in her knee. She is able to walk at the store.   Current Outpatient Medications on File Prior to Visit  Medication Sig Dispense Refill  . acetaminophen (TYLENOL) 500 MG tablet Take 500 mg by mouth every 6 (six) hours as needed for mild pain or headache.     . Artificial Tear Ointment (DRY EYES OP) Place 1 drop into both eyes daily at 2 PM.    . atorvastatin (LIPITOR) 10 MG tablet Take 10 mg by mouth at bedtime.    . docusate sodium (COLACE) 50 MG capsule Take 50 mg by mouth daily as needed for mild constipation.    . ergocalciferol (VITAMIN D2) 1.25 MG (50000 UT) capsule Take 50,000 Units by mouth every Friday.    . flecainide (TAMBOCOR) 50 MG tablet Take 1 tablet (50 mg total) by mouth 2 (two) times daily. 180 tablet 1  . fluticasone (FLONASE) 50 MCG/ACT nasal spray Place 1 spray into both nostrils daily at 2 PM.  3  . hydrochlorothiazide (HYDRODIURIL) 25 MG tablet TAKE 1 TABLET BY MOUTH ONCE DAILY (Patient taking differently: Take 25 mg  by mouth daily. ) 90 tablet 3  . losartan (COZAAR) 25 MG tablet Take 1 tablet (25 mg total) by mouth daily. 90 tablet 1  . Multiple Vitamins-Minerals (PRESERVISION AREDS PO) Take 1 capsule by mouth 2 (two) times daily.    . nitroGLYCERIN (NITROSTAT) 0.4 MG SL tablet Place 1 tablet (0.4 mg total) under the tongue every 5 (five) minutes as needed for chest pain. 30 tablet 0  . omeprazole (PRILOSEC) 20 MG capsule Take 20 mg by mouth 2 (two) times daily.     . ondansetron (ZOFRAN) 4 MG tablet Take 4 mg by mouth every 8 (eight) hours as needed. for nausea  0  . polyethylene glycol (MIRALAX / GLYCOLAX) packet Take 17 g by mouth daily as needed for moderate constipation.    . traMADol (ULTRAM) 50 MG tablet Take 1-2 tablets (50-100 mg total) by mouth every 6 (six) hours as needed for moderate pain. 40 tablet 0  . vitamin B-12 (CYANOCOBALAMIN) 1000 MCG tablet Take 1,000 mcg by mouth every evening.     . warfarin (COUMADIN) 6 MG tablet Take 3-6 tablets by mouth See admin instructions. Take 6 mg by mouth daily on Monday, Wednesday  Take 3 mg by mouth daily on all other days.     No current facility-administered medications on file prior to visit.     Allergies  Allergen Reactions  .  Amlodipine Swelling  . Cephalexin Other (See Comments)    unknown  . Codeine Other (See Comments)    unknown  . Metoclopramide Hcl Other (See Comments)    unknown    Past Medical History:  Diagnosis Date  . Arthritis    right  . Atrial fibrillation ([place])   . GERD (gastroesophageal reflux disease)    hx of  . Hepatitis C   . Humerus fracture   . Hyperlipidemia   . Hypertension   . Irritable bowel syndrome   . Meningioma ([place])   . PONV (postoperative nausea and vomiting)     Past Surgical History:  Procedure Laterality Date  . BREAST SURGERY     lumpectomy  . CARDIOVASCULAR STRESS TEST  11/14/2006   EF 67%  . COLONOSCOPY W/ BIOPSIES AND POLYPECTOMY    . ORIF HUMERUS FRACTURE Right 04/18/2014    Procedure: OPEN REDUCTION INTERNAL FIXATION (ORIF) RIGHT PROXIMAL HUMERUS FRACTURE;  Surgeon: Netta Cedars, MD;  Location: [place];  Service: Orthopedics;  Laterality: Right;  . ORIF PERIPROSTHETIC FRACTURE Left 04/30/2013   Procedure: OPEN REDUCTION INTERNAL FIXATION (ORIF) PERIPROSTHETIC FRACTURE;  Surgeon: [name], MD;  Location: [place];  Service: Orthopedics;  Laterality: Left;  . PAROTID GLAND TUMOR EXCISION    . TOTAL HIP ARTHROPLASTY  11/07   left  . TOTAL KNEE ARTHROPLASTY    . TOTAL KNEE ARTHROPLASTY Left 11/24/2017   Procedure: TOTAL KNEE ARTHROPLASTY;  Surgeon: Gaynelle Arabian, MD;  [facility];  Service: Orthopedics;  Laterality: Left;  65min  . TRANSTHORACIC ECHOCARDIOGRAM  10/25/2009   EF 55-60%    Social History   Tobacco Use  Smoking Status Former Smoker  . Packs/day: 0.30  . Years: 15.00  . Pack years: 4.50  . Types: Cigarettes  . Last attempt to quit: 06/07/1980  . Years since quitting: 37.7  Smokeless Tobacco Never Used    Social History   Substance and Sexual Activity  Alcohol Use No    Family History  Problem Relation Age of Onset  . Heart disease Brother     Review of Systems: As noted in history of present illness.  All other systems were reviewed and are negative.  Physical Exam: BP 140/70 (BP Location: Left Arm, Cuff Size: Normal)   Pulse (!) 54   Ht 5\' 5"  (1.651 m)   Wt 141 lb 3.2 oz (64 kg)   SpO2 96%   BMI 23.50 kg/m  GENERAL:  Well appearing, elderly WF  HEENT:  PERRL, EOMI, sclera are clear. Oropharynx is clear. NECK:  No jugular venous distention, carotid upstroke brisk and symmetric, no bruits, no thyromegaly or adenopathy LUNGS:  Clear to auscultation bilaterally CHEST:  Unremarkable HEART:  RRR,  PMI not displaced or sustained,S1 and S2 within normal limits, no S3, no S4: no clicks, no rubs, no murmurs ABD:  Soft, nontender. BS +, no masses or bruits. No hepatomegaly, no splenomegaly EXT:  2 + pulses throughout, no edema,  no cyanosis no clubbing. Left knee is swollen compared to right. SKIN:  Warm and dry.  No rashes NEURO:  Alert and oriented x 3. Cranial nerves II through XII intact. PSYCH:  Cognitively intact     LABORATORY DATA: Lab Results  Component Value Date   WBC 6.7 12/12/2017   HGB 13.9 12/12/2017   HCT 41.0 12/12/2017   PLT 417 (H) 12/12/2017   GLUCOSE 102 (H) 12/12/2017   CHOL 178 07/14/2016   TRIG 69 07/14/2016   HDL 58  07/14/2016   LDLCALC 106 (H) 07/14/2016   ALT 16 11/17/2017   AST 22 11/17/2017   NA 136 12/12/2017   K 4.0 12/12/2017   CL 100 12/12/2017   CREATININE 0.70 12/12/2017   BUN 17 12/12/2017   CO2 27 11/26/2017   TSH 2.129 06/05/2016   INR 1.13 12/12/2017   HGBA1C 5.5 07/14/2016   Labs dated 03/20/16: cholesterol 200, triglycerides 75, LDL 116, HDL 69. CMET is normal. Dated 03/20/17: cholesterol 160, triglycerides 65, HDL 65, LDL 82.  Myoview 06/05/16: Study Result     The study is normal.  This is a low risk study.  The left ventricular ejection fraction is hyperdynamic (>65%).  There was no ST segment deviation noted during stress.   Normal resting and stress perfusion. No ischemia or infarction EF 72%    Echo 06/05/16: Study Conclusions  - Left ventricle: The cavity size was normal. Wall thickness was   increased in a pattern of mild LVH. There was moderate concentric   hypertrophy. Systolic function was normal. The estimated ejection   fraction was in the range of 55% to 60%. - Left atrium: The atrium was moderately dilated. - Atrial septum: No defect or patent foramen ovale was identified.  Assessment / Plan: 1. Atrial fibrillation- paroxysmal. This is well controlled on low dose flecainide. Maintaining NSR on my exam. We will continue her current therapy.   2. Chronic anticoagulation, therapeutic. Continue Coumadin.  3. Hypertension,   well controlled. Continue losartan and HCT.     I will follow up in 6 months

## 2018-02-10 ENCOUNTER — Encounter: Payer: Self-pay | Admitting: Cardiology

## 2018-02-10 ENCOUNTER — Ambulatory Visit (INDEPENDENT_AMBULATORY_CARE_PROVIDER_SITE_OTHER): Payer: Medicare Other | Admitting: Cardiology

## 2018-02-10 ENCOUNTER — Ambulatory Visit (INDEPENDENT_AMBULATORY_CARE_PROVIDER_SITE_OTHER): Payer: Medicare Other | Admitting: Pharmacist Clinician (PhC)/ Clinical Pharmacy Specialist

## 2018-02-10 VITALS — BP 140/70 | HR 54 | Ht 65.0 in | Wt 141.2 lb

## 2018-02-10 DIAGNOSIS — I480 Paroxysmal atrial fibrillation: Secondary | ICD-10-CM

## 2018-02-10 DIAGNOSIS — I10 Essential (primary) hypertension: Secondary | ICD-10-CM | POA: Diagnosis not present

## 2018-02-10 DIAGNOSIS — Z7901 Long term (current) use of anticoagulants: Secondary | ICD-10-CM

## 2018-02-10 DIAGNOSIS — E7800 Pure hypercholesterolemia, unspecified: Secondary | ICD-10-CM | POA: Diagnosis not present

## 2018-02-10 DIAGNOSIS — I4891 Unspecified atrial fibrillation: Secondary | ICD-10-CM

## 2018-02-10 LAB — POCT INR: INR: 1.9 — AB (ref 2.0–3.0)

## 2018-02-16 ENCOUNTER — Other Ambulatory Visit: Payer: Self-pay | Admitting: Gastroenterology

## 2018-02-16 DIAGNOSIS — Z86010 Personal history of colonic polyps: Secondary | ICD-10-CM | POA: Diagnosis not present

## 2018-02-16 DIAGNOSIS — R1013 Epigastric pain: Secondary | ICD-10-CM

## 2018-02-16 DIAGNOSIS — K219 Gastro-esophageal reflux disease without esophagitis: Secondary | ICD-10-CM | POA: Diagnosis not present

## 2018-02-16 DIAGNOSIS — I4891 Unspecified atrial fibrillation: Secondary | ICD-10-CM | POA: Diagnosis not present

## 2018-02-16 DIAGNOSIS — Z7901 Long term (current) use of anticoagulants: Secondary | ICD-10-CM | POA: Diagnosis not present

## 2018-02-18 ENCOUNTER — Ambulatory Visit
Admission: RE | Admit: 2018-02-18 | Discharge: 2018-02-18 | Disposition: A | Discharge status: Home / Self Care | Payer: Medicare Other | Source: Ambulatory Visit | Attending: Gastroenterology | Admitting: Gastroenterology

## 2018-02-18 DIAGNOSIS — N281 Cyst of kidney, acquired: Secondary | ICD-10-CM | POA: Diagnosis not present

## 2018-02-18 DIAGNOSIS — R1013 Epigastric pain: Secondary | ICD-10-CM

## 2018-03-02 DIAGNOSIS — R1013 Epigastric pain: Secondary | ICD-10-CM | POA: Diagnosis not present

## 2018-03-04 ENCOUNTER — Other Ambulatory Visit: Payer: Self-pay | Admitting: Cardiology

## 2018-03-05 DIAGNOSIS — Z6823 Body mass index (BMI) 23.0-23.9, adult: Secondary | ICD-10-CM | POA: Diagnosis not present

## 2018-03-05 DIAGNOSIS — K219 Gastro-esophageal reflux disease without esophagitis: Secondary | ICD-10-CM | POA: Diagnosis not present

## 2018-03-05 DIAGNOSIS — I480 Paroxysmal atrial fibrillation: Secondary | ICD-10-CM | POA: Diagnosis not present

## 2018-03-05 DIAGNOSIS — Z7901 Long term (current) use of anticoagulants: Secondary | ICD-10-CM | POA: Diagnosis not present

## 2018-03-09 DIAGNOSIS — I4891 Unspecified atrial fibrillation: Secondary | ICD-10-CM | POA: Diagnosis not present

## 2018-03-09 DIAGNOSIS — Z7901 Long term (current) use of anticoagulants: Secondary | ICD-10-CM | POA: Diagnosis not present

## 2018-03-09 DIAGNOSIS — R1013 Epigastric pain: Secondary | ICD-10-CM | POA: Diagnosis not present

## 2018-03-09 DIAGNOSIS — Z86010 Personal history of colonic polyps: Secondary | ICD-10-CM | POA: Diagnosis not present

## 2018-03-24 DIAGNOSIS — I10 Essential (primary) hypertension: Secondary | ICD-10-CM | POA: Diagnosis not present

## 2018-03-24 DIAGNOSIS — E7849 Other hyperlipidemia: Secondary | ICD-10-CM | POA: Diagnosis not present

## 2018-03-24 DIAGNOSIS — M859 Disorder of bone density and structure, unspecified: Secondary | ICD-10-CM | POA: Diagnosis not present

## 2018-03-31 DIAGNOSIS — M859 Disorder of bone density and structure, unspecified: Secondary | ICD-10-CM | POA: Diagnosis not present

## 2018-03-31 DIAGNOSIS — Z7901 Long term (current) use of anticoagulants: Secondary | ICD-10-CM | POA: Diagnosis not present

## 2018-03-31 DIAGNOSIS — M545 Low back pain: Secondary | ICD-10-CM | POA: Diagnosis not present

## 2018-03-31 DIAGNOSIS — Z0000 Encounter for general adult medical examination without abnormal findings: Secondary | ICD-10-CM | POA: Diagnosis not present

## 2018-03-31 DIAGNOSIS — I480 Paroxysmal atrial fibrillation: Secondary | ICD-10-CM | POA: Diagnosis not present

## 2018-03-31 DIAGNOSIS — E7849 Other hyperlipidemia: Secondary | ICD-10-CM | POA: Diagnosis not present

## 2018-03-31 DIAGNOSIS — I10 Essential (primary) hypertension: Secondary | ICD-10-CM | POA: Diagnosis not present

## 2018-03-31 DIAGNOSIS — K219 Gastro-esophageal reflux disease without esophagitis: Secondary | ICD-10-CM | POA: Diagnosis not present

## 2018-03-31 DIAGNOSIS — Z1331 Encounter for screening for depression: Secondary | ICD-10-CM | POA: Diagnosis not present

## 2018-03-31 DIAGNOSIS — Z6823 Body mass index (BMI) 23.0-23.9, adult: Secondary | ICD-10-CM | POA: Diagnosis not present

## 2018-03-31 DIAGNOSIS — R2681 Unsteadiness on feet: Secondary | ICD-10-CM | POA: Diagnosis not present

## 2018-04-28 DIAGNOSIS — I480 Paroxysmal atrial fibrillation: Secondary | ICD-10-CM | POA: Diagnosis not present

## 2018-04-28 DIAGNOSIS — I1310 Hypertensive heart and chronic kidney disease without heart failure, with stage 1 through stage 4 chronic kidney disease, or unspecified chronic kidney disease: Secondary | ICD-10-CM | POA: Diagnosis not present

## 2018-04-28 DIAGNOSIS — M545 Low back pain: Secondary | ICD-10-CM | POA: Diagnosis not present

## 2018-04-28 DIAGNOSIS — K3184 Gastroparesis: Secondary | ICD-10-CM | POA: Diagnosis not present

## 2018-04-28 DIAGNOSIS — Z7901 Long term (current) use of anticoagulants: Secondary | ICD-10-CM | POA: Diagnosis not present

## 2018-04-28 DIAGNOSIS — K219 Gastro-esophageal reflux disease without esophagitis: Secondary | ICD-10-CM | POA: Diagnosis not present

## 2018-04-28 DIAGNOSIS — N183 Chronic kidney disease, stage 3 (moderate): Secondary | ICD-10-CM | POA: Diagnosis not present

## 2018-06-02 DIAGNOSIS — I480 Paroxysmal atrial fibrillation: Secondary | ICD-10-CM | POA: Diagnosis not present

## 2018-06-02 DIAGNOSIS — I1310 Hypertensive heart and chronic kidney disease without heart failure, with stage 1 through stage 4 chronic kidney disease, or unspecified chronic kidney disease: Secondary | ICD-10-CM | POA: Diagnosis not present

## 2018-06-02 DIAGNOSIS — Z7901 Long term (current) use of anticoagulants: Secondary | ICD-10-CM | POA: Diagnosis not present

## 2018-06-02 DIAGNOSIS — M542 Cervicalgia: Secondary | ICD-10-CM | POA: Diagnosis not present

## 2018-06-02 DIAGNOSIS — K219 Gastro-esophageal reflux disease without esophagitis: Secondary | ICD-10-CM | POA: Diagnosis not present

## 2018-06-02 DIAGNOSIS — N183 Chronic kidney disease, stage 3 (moderate): Secondary | ICD-10-CM | POA: Diagnosis not present

## 2018-06-08 DIAGNOSIS — D225 Melanocytic nevi of trunk: Secondary | ICD-10-CM | POA: Diagnosis not present

## 2018-06-16 ENCOUNTER — Telehealth: Payer: Self-pay | Admitting: Cardiology

## 2018-06-16 NOTE — Telephone Encounter (Signed)
Returned call to patient. She explained her dentist noted carotid artery calcifications on xray. Explained that she has known mild carotid disease by 07/2016 doppler study. Patient reports a stiff neck on the left side. No other complaints. Routed to MD to advise if any testing is needed to f/up on this.

## 2018-06-16 NOTE — Telephone Encounter (Signed)
Spoke to patient Dr.Jordan's advice given.Advised to keep appointment as planned 09/25/18 at 4:00 pm.Advised to call sooner if needed.

## 2018-06-16 NOTE — Telephone Encounter (Signed)
She had carotid dopplers 2 years ago with mild nonobstructive plaque. Nothing needs to be done. Will talk to her at her next visit.  Peter Martinique MD, [facility]

## 2018-06-16 NOTE — Telephone Encounter (Signed)
  Gloria [name] went to dentist today and had xrays where they found she has calcification around the carotid area. Her dentist told her to call Dr Martinique to let him know and to say that if we need the xrays the dentist would be happy to send it. Dentist is Dr Daneen Schick phone [phone]. Patient has appt in September but would like to know if she should be seen sooner.

## 2018-06-23 DIAGNOSIS — I480 Paroxysmal atrial fibrillation: Secondary | ICD-10-CM | POA: Diagnosis not present

## 2018-06-23 DIAGNOSIS — I1310 Hypertensive heart and chronic kidney disease without heart failure, with stage 1 through stage 4 chronic kidney disease, or unspecified chronic kidney disease: Secondary | ICD-10-CM | POA: Diagnosis not present

## 2018-06-23 DIAGNOSIS — N183 Chronic kidney disease, stage 3 (moderate): Secondary | ICD-10-CM | POA: Diagnosis not present

## 2018-06-23 DIAGNOSIS — Z7901 Long term (current) use of anticoagulants: Secondary | ICD-10-CM | POA: Diagnosis not present

## 2018-06-23 DIAGNOSIS — K1120 Sialoadenitis, unspecified: Secondary | ICD-10-CM | POA: Diagnosis not present

## 2018-06-24 DIAGNOSIS — Z7901 Long term (current) use of anticoagulants: Secondary | ICD-10-CM | POA: Diagnosis not present

## 2018-06-24 DIAGNOSIS — K1120 Sialoadenitis, unspecified: Secondary | ICD-10-CM | POA: Diagnosis not present

## 2018-06-24 DIAGNOSIS — I480 Paroxysmal atrial fibrillation: Secondary | ICD-10-CM | POA: Diagnosis not present

## 2018-06-30 DIAGNOSIS — Z7901 Long term (current) use of anticoagulants: Secondary | ICD-10-CM | POA: Diagnosis not present

## 2018-06-30 DIAGNOSIS — K1120 Sialoadenitis, unspecified: Secondary | ICD-10-CM | POA: Diagnosis not present

## 2018-06-30 DIAGNOSIS — I480 Paroxysmal atrial fibrillation: Secondary | ICD-10-CM | POA: Diagnosis not present

## 2018-06-30 IMAGING — MR MR HEAD W/O CM
9 of 11 series · 32 of 48 positions shown · non-contrast
Comparison: Head CT from yesterday.  Brain MRI 11/21/2016

CLINICAL DATA: TIA.  Right-sided facial droop.

EXAM:
MRI HEAD WITHOUT CONTRAST
MRA HEAD WITHOUT CONTRAST
TECHNIQUE: Multiplanar, multiecho pulse sequences of the brain and surrounding
structures were obtained without intravenous contrast. Angiographic
images of the head were obtained using MRA technique without
contrast.

[Series 3: DWI · axial · 3.0mm · 1.09mm/px · z∈[-101,+40]mm · 7 of 96 slices shown (1 of 4)]
[im 1/96]
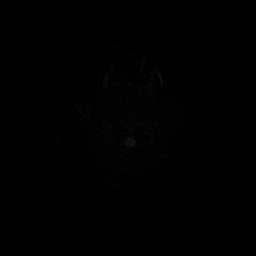
[im 16/96]
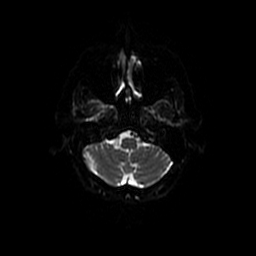
[im 32/96]
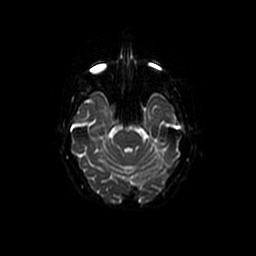
[im 48/96]
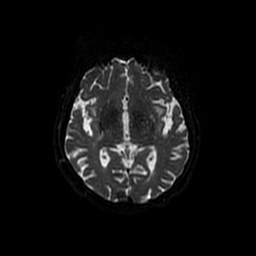
[im 64/96]
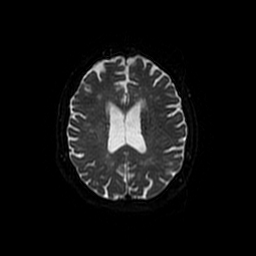
[im 80/96]
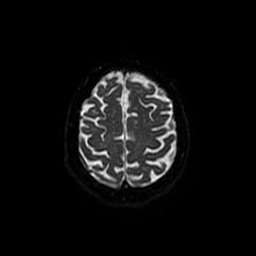
[im 96/96]
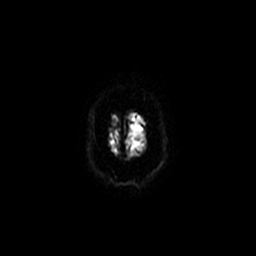

[Series 4: (id) mt fs · axial · 1.4mm · 0.43mm/px · z∈[-104,-47]mm · 5 of 136 slices shown]
[im 1/136]
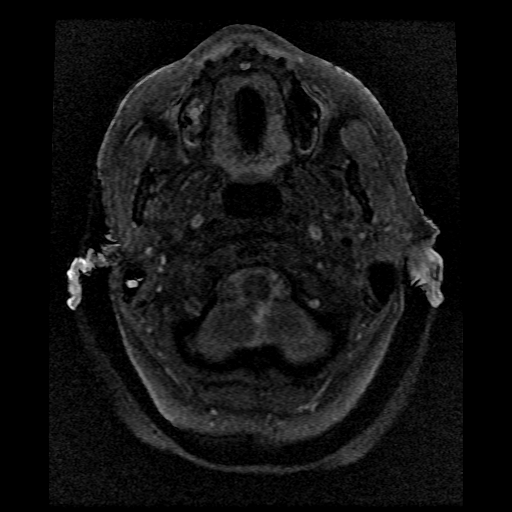
[im 28/136]
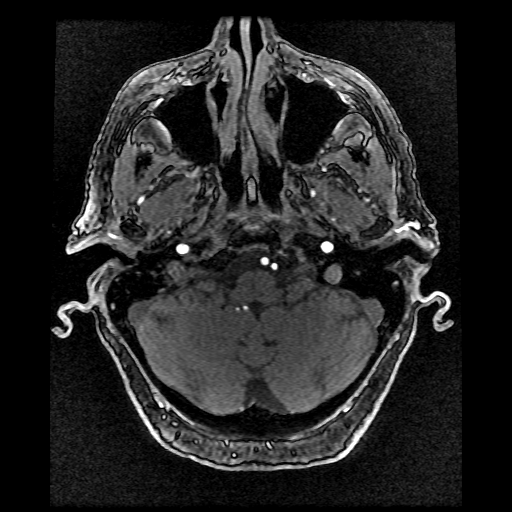
[im 41/136]
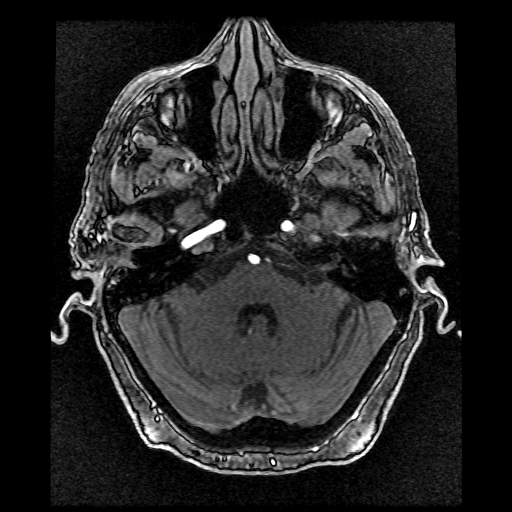
[im 55/136]
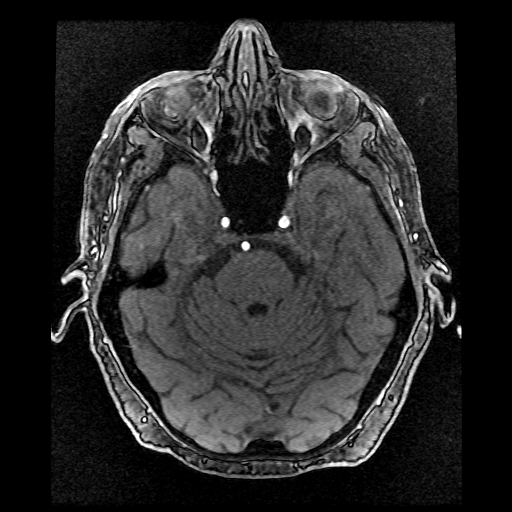
[im 82/136]
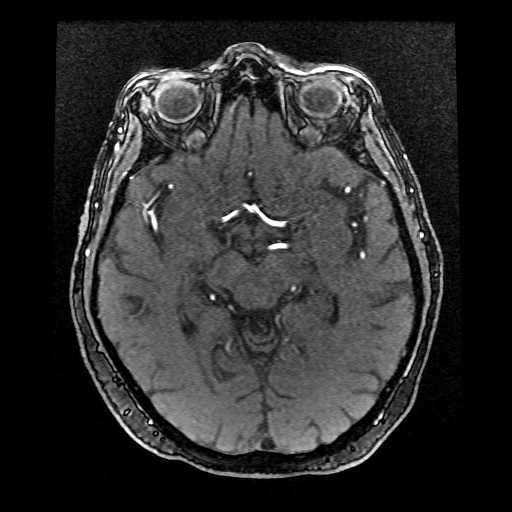

[Series 5: T1 · sagittal · 5.0mm · 0.47mm/px · 2 of 23 slices shown]
[im 1/23]
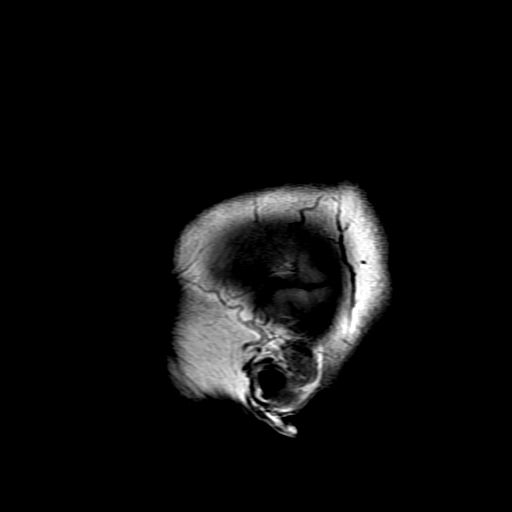
[im 23/23]
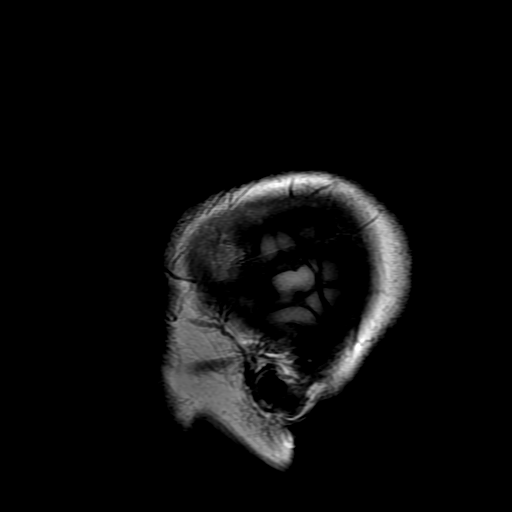

[Series 6: T2 · axial · 5.0mm · 0.47mm/px · z∈[-111,+38]mm · 2 of 26 slices shown (1 of 2)]
[im 1/26]
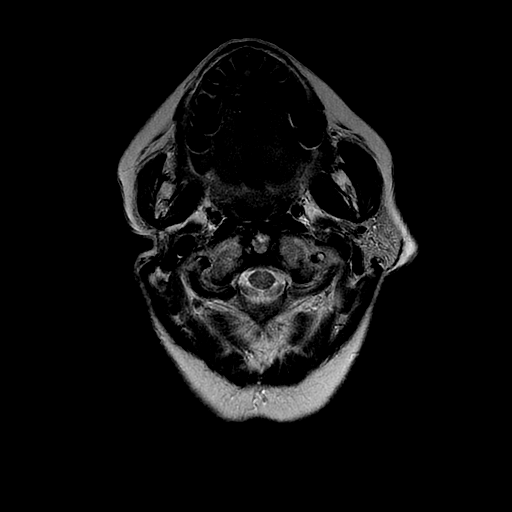
[im 26/26]
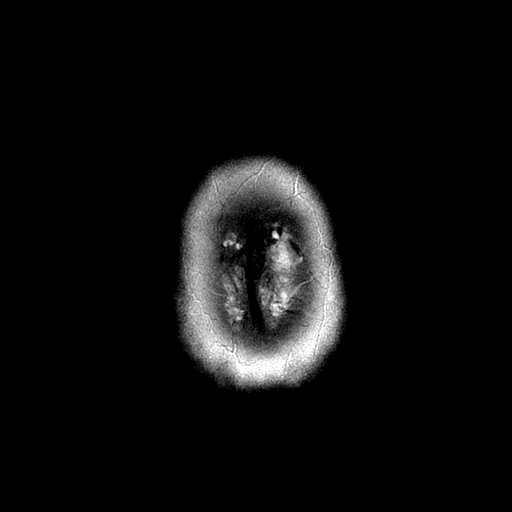

[Series 7: FLAIR · axial · 5.0mm · 0.47mm/px · z∈[-111,+38]mm · 2 of 26 slices shown]
[im 1/26]
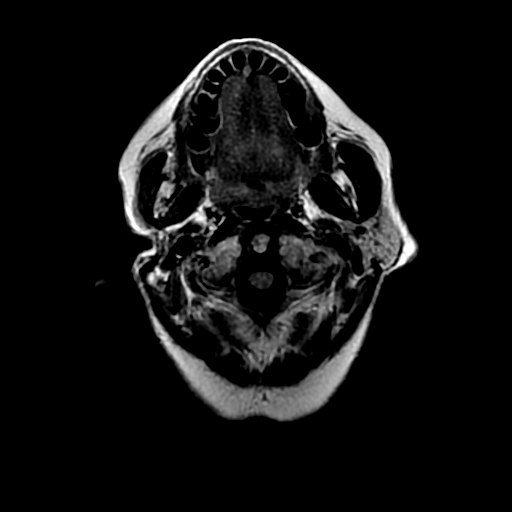
[im 26/26]
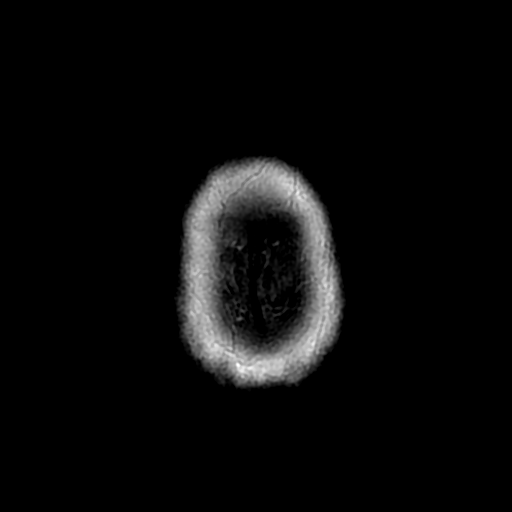

[Series 8: DWI · coronal · 5.0mm · 1.09mm/px · 5 of 70 slices shown (2 of 4)]
[im 1/70]
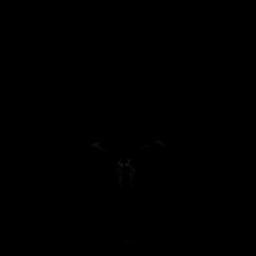
[im 18/70]
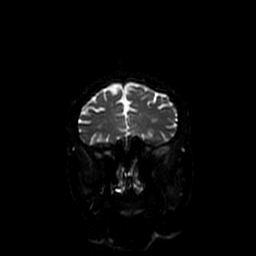
[im 35/70]
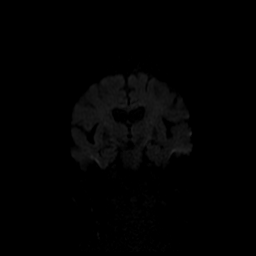
[im 52/70]
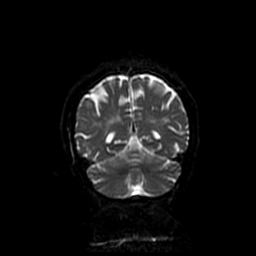
[im 70/70]
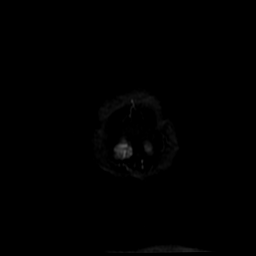

[Series 11: T2 · coronal · 5.0mm · 0.43mm/px · 2 of 25 slices shown (2 of 2)]
[im 1/25]
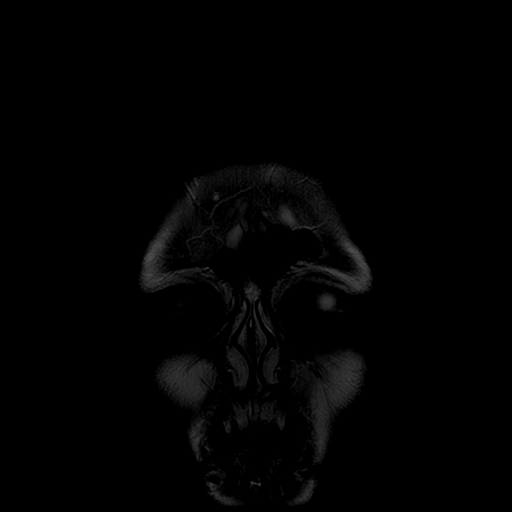
[im 25/25]
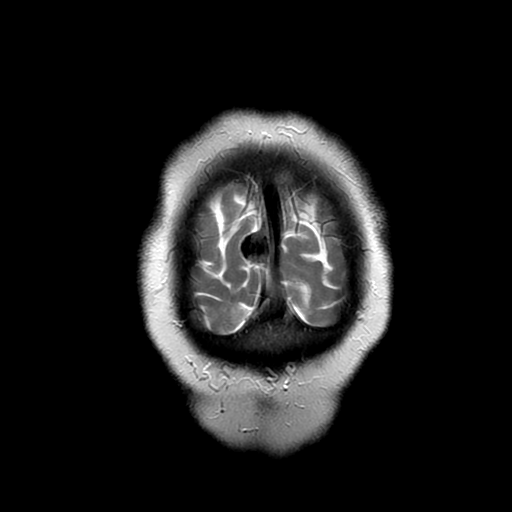

[Series 300: DWI · axial · 3.0mm · 1.09mm/px · z∈[-101,+40]mm · 4 of 48 slices shown (3 of 4)]
[im 1/48]
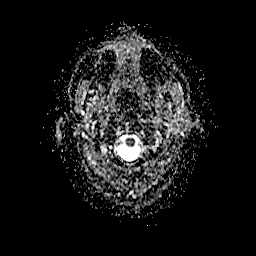
[im 16/48]
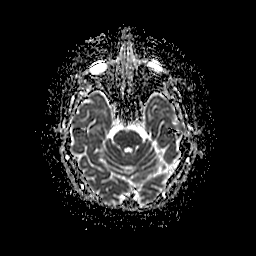
[im 32/48]
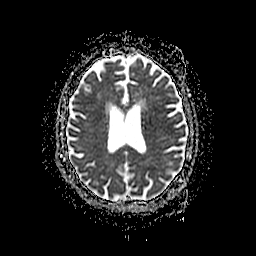
[im 48/48]
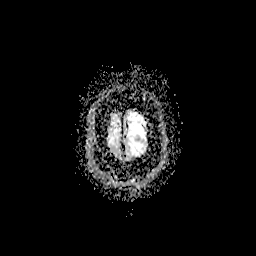

[Series 800: DWI · coronal · 5.0mm · 1.09mm/px · 3 of 35 slices shown (4 of 4)]
[im 1/35]
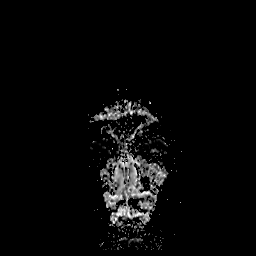
[im 18/35]
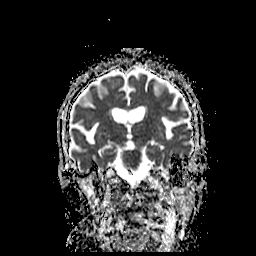
[im 35/35]
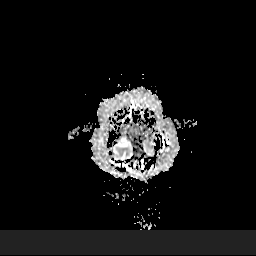

[32 of 48 positions shown; findings below may reference images not displayed]

FINDINGS: MRI HEAD FINDINGS

Brain: No acute infarction, hemorrhage, hydrocephalus, extra-axial
collection. Long-standing calcified meningioma at the right
occipital pole measuring 15 mm. Mild for age chronic microvascular
ischemic gliosis in the cerebral white matter. There have been
interval but remote lacunar infarcts in the right centrum semiovale
and right caudate head. Normal brain volume for age.

Vascular: Arterial findings below. Normal dural venous sinus flow
voids.

Skull and upper cervical spine: Negative for marrow lesion

Sinuses/Orbits: Bilateral cataract resection. New right mastoid air
cell fluid/mucosal thickening. Opacified cell at the right petrous
apex is chronic and non expansile. Negative nasopharynx.

MRA HEAD FINDINGS

Symmetric carotid and vertebral arteries. Major vessels are smooth
and widely patent. No branch occlusion or aneurysm noted.
IMPRESSION: 1. No acute intracranial finding, including infarct. Negative
intracranial MRA.
2. Right mastoid opacification that is new from 9465. Negative
nasopharynx.
3. Chronic small vessel ischemia with mild progression since April 21, 09 mm right occipital calcified meningioma.

## 2018-07-08 DIAGNOSIS — H353131 Nonexudative age-related macular degeneration, bilateral, early dry stage: Secondary | ICD-10-CM | POA: Diagnosis not present

## 2018-07-08 DIAGNOSIS — H52203 Unspecified astigmatism, bilateral: Secondary | ICD-10-CM | POA: Diagnosis not present

## 2018-07-08 DIAGNOSIS — Z961 Presence of intraocular lens: Secondary | ICD-10-CM | POA: Diagnosis not present

## 2018-07-28 DIAGNOSIS — Z7901 Long term (current) use of anticoagulants: Secondary | ICD-10-CM | POA: Diagnosis not present

## 2018-07-28 DIAGNOSIS — I480 Paroxysmal atrial fibrillation: Secondary | ICD-10-CM | POA: Diagnosis not present

## 2018-08-30 ENCOUNTER — Other Ambulatory Visit: Payer: Self-pay | Admitting: Cardiology

## 2018-09-01 DIAGNOSIS — Z7901 Long term (current) use of anticoagulants: Secondary | ICD-10-CM | POA: Diagnosis not present

## 2018-09-01 DIAGNOSIS — I480 Paroxysmal atrial fibrillation: Secondary | ICD-10-CM | POA: Diagnosis not present

## 2018-09-01 DIAGNOSIS — K3184 Gastroparesis: Secondary | ICD-10-CM | POA: Diagnosis not present

## 2018-09-10 ENCOUNTER — Other Ambulatory Visit: Payer: Self-pay | Admitting: Internal Medicine

## 2018-09-10 DIAGNOSIS — Z1231 Encounter for screening mammogram for malignant neoplasm of breast: Secondary | ICD-10-CM

## 2018-09-16 DIAGNOSIS — H6123 Impacted cerumen, bilateral: Secondary | ICD-10-CM | POA: Diagnosis not present

## 2018-09-16 DIAGNOSIS — H9113 Presbycusis, bilateral: Secondary | ICD-10-CM | POA: Diagnosis not present

## 2018-09-21 DIAGNOSIS — H903 Sensorineural hearing loss, bilateral: Secondary | ICD-10-CM | POA: Diagnosis not present

## 2018-09-24 NOTE — Progress Notes (Signed)
Gloria [name] Date of Birth: 10-28-1930   History of Present Illness: [name] seen today for followup paroxysmal afib. She has a history of atrial fibrillation controlled with flecainide. She is on chronic anticoagulation with coumadin. She tried Xarelto in the past but this upset her stomach.   She was admitted in May 2018 with symptoms of chest pressure and SOB. She ruled out for MI. Echo showed moderate LVH and LAE with normal systolic function. Myoview study was normal. She was placed on amlodipine for HTN. Subsequently she developed lower extremity swelling with a rash and red blotches. Amlodipine was stopped and this resolved.   She did have dental Xray in June which showed some calcification in her carotid artery. Last dopplers in June 2018 showed less than 39% stenosis.  On follow up today she is seen with her daughter. She is generally doing well. She reports BP at home has been well controlled. She  denies any symptoms of palpitations. She denies any chest pain or SOB.   She reports INRs have been therapeutic. No bleeding. No Neurologic symptoms.  Current Outpatient Medications on File Prior to Visit  Medication Sig Dispense Refill  . acetaminophen (TYLENOL) 500 MG tablet Take 500 mg by mouth every 6 (six) hours as needed for mild pain or headache.     . Artificial Tear Ointment (DRY EYES OP) Place 1 drop into both eyes daily at 2 PM.    . atorvastatin (LIPITOR) 10 MG tablet Take 10 mg by mouth at bedtime.    . ergocalciferol (VITAMIN D2) 1.25 MG (50000 UT) capsule Take 50,000 Units by mouth every Friday.    . flecainide (TAMBOCOR) 50 MG tablet Take 1 tablet (50 mg total) by mouth 2 (two) times daily. 180 tablet 1  . hydrochlorothiazide (HYDRODIURIL) 25 MG tablet Take 1 tablet by mouth once daily 90 tablet 1  . losartan (COZAAR) 25 MG tablet Take 1 tablet (25 mg total) by mouth daily. 90 tablet 1  . Multiple Vitamins-Minerals (PRESERVISION AREDS PO) Take 1 capsule by mouth  2 (two) times daily.    . nitroGLYCERIN (NITROSTAT) 0.4 MG SL tablet Place 1 tablet (0.4 mg total) under the tongue every 5 (five) minutes as needed for chest pain. 30 tablet 0  . omeprazole (PRILOSEC) 20 MG capsule Take 20 mg by mouth 2 (two) times daily.     . polyethylene glycol (MIRALAX / GLYCOLAX) packet Take 17 g by mouth daily as needed for moderate constipation.    . vitamin B-12 (CYANOCOBALAMIN) 1000 MCG tablet Take 1,000 mcg by mouth every evening.     . warfarin (COUMADIN) 6 MG tablet Take 3-6 tablets by mouth See admin instructions. Take 6 mg by mouth daily on Monday, Wednesday  Take 3 mg by mouth daily on all other days.     No current facility-administered medications on file prior to visit.     Allergies  Allergen Reactions  . Amlodipine Swelling  . Cephalexin Other (See Comments)    unknown  . Codeine Other (See Comments)    unknown  . Metoclopramide Hcl Other (See Comments)    unknown    Past Medical History:  Diagnosis Date  . Arthritis    right  . Atrial fibrillation ([place])   . GERD (gastroesophageal reflux disease)    hx of  . Hepatitis C   . Humerus fracture   . Hyperlipidemia   . Hypertension   . Irritable bowel syndrome   . Meningioma ([place])   .  PONV (postoperative nausea and vomiting)     Past Surgical History:  Procedure Laterality Date  . BREAST SURGERY     lumpectomy  . CARDIOVASCULAR STRESS TEST  11/14/2006   EF 67%  . COLONOSCOPY W/ BIOPSIES AND POLYPECTOMY    . ORIF HUMERUS FRACTURE Right 04/18/2014   Procedure: OPEN REDUCTION INTERNAL FIXATION (ORIF) RIGHT PROXIMAL HUMERUS FRACTURE;  Surgeon: Netta Cedars, MD;  Location: [facility];  Service: Orthopedics;  Laterality: Right;  . ORIF PERIPROSTHETIC FRACTURE Left 04/30/2013   Procedure: OPEN REDUCTION INTERNAL FIXATION (ORIF) PERIPROSTHETIC FRACTURE;  Surgeon: [name], MD;  Location: [facility];  Service: Orthopedics;  Laterality: Left;  . PAROTID GLAND TUMOR EXCISION    . TOTAL HIP ARTHROPLASTY   11/07   left  . TOTAL KNEE ARTHROPLASTY    . TOTAL KNEE ARTHROPLASTY Left 11/24/2017   Procedure: TOTAL KNEE ARTHROPLASTY;  Surgeon: Gaynelle Arabian, MD;  Location: [facility];  Service: Orthopedics;  Laterality: Left;  65min  . TRANSTHORACIC ECHOCARDIOGRAM  10/25/2009   EF 55-60%    Social History   Tobacco Use  Smoking Status Former Smoker  . Packs/day: 0.30  . Years: 15.00  . Pack years: 4.50  . Types: Cigarettes  . Quit date: 06/07/1980  . Years since quitting: 38.3  Smokeless Tobacco Never Used    Social History   Substance and Sexual Activity  Alcohol Use No    Family History  Problem Relation Age of Onset  . Heart disease Brother     Review of Systems: As noted in history of present illness.  All other systems were reviewed and are negative.  Physical Exam: BP (!) 174/76   Pulse 86   Temp (!) 97.3 F (36.3 C) (Temporal)   Ht 5' 0.75" (1.543 m)   Wt 150 lb 6.4 oz (68.2 kg)   SpO2 93%   BMI 28.65 kg/m  GENERAL:  Well appearing, elderly WF  HEENT:  PERRL, EOMI, sclera are clear. Oropharynx is clear. NECK:  No jugular venous distention, carotid upstroke brisk and symmetric, bilateral carotid bruits, no thyromegaly or adenopathy LUNGS:  Clear to auscultation bilaterally CHEST:  Unremarkable HEART:  RRR,  PMI not displaced or sustained,S1 and S2 within normal limits, no S3, no S4: no clicks, no rubs, gr [id number] SEM RUSB ABD:  Soft, nontender. BS +, no masses or bruits. No hepatomegaly, no splenomegaly EXT:  2 + pulses throughout, no edema, no cyanosis no clubbing SKIN:  Warm and dry.  No rashes NEURO:  Alert and oriented x 3. Cranial nerves II through XII intact. PSYCH:  Cognitively intact     LABORATORY DATA: Lab Results  Component Value Date   WBC 6.7 12/12/2017   HGB 13.9 12/12/2017   HCT 41.0 12/12/2017   PLT 417 (H) 12/12/2017   GLUCOSE 102 (H) 12/12/2017   CHOL 178 07/14/2016   TRIG 69 07/14/2016   HDL 58 07/14/2016   LDLCALC 106 (H) 07/14/2016    ALT 16 11/17/2017   AST 22 11/17/2017   NA 136 12/12/2017   K 4.0 12/12/2017   CL 100 12/12/2017   CREATININE 0.70 12/12/2017   BUN 17 12/12/2017   CO2 27 11/26/2017   TSH 2.129 06/05/2016   INR 1.9 (A) 02/10/2018   HGBA1C 5.5 07/14/2016   Labs dated 03/20/16: cholesterol 200, triglycerides 75, LDL 116, HDL 69. CMET is normal. Dated 03/24/18: cholesterol 172, triglycerides 70, HDL 63, LDL 95. CBC and CMET normal   Myoview 06/05/16: Study Result  The study is normal.  This is a low risk study.  The left ventricular ejection fraction is hyperdynamic (>65%).  There was no ST segment deviation noted during stress.   Normal resting and stress perfusion. No ischemia or infarction EF 72%    Echo 06/05/16: Study Conclusions  - Left ventricle: The cavity size was normal. Wall thickness was   increased in a pattern of mild LVH. There was moderate concentric   hypertrophy. Systolic function was normal. The estimated ejection   fraction was in the range of 55% to 60%. - Left atrium: The atrium was moderately dilated. - Atrial septum: No defect or patent foramen ovale was identified.  Assessment / Plan: 1. Atrial fibrillation- paroxysmal. This is well controlled on low dose flecainide. Appears to be in NSR today.We will continue her current therapy. On coumadin.   2. Chronic anticoagulation, therapeutic.   3. Hypertension, BP elevated today but has been well controlled at home.  Continue losartan and HCT and monitor.   4. Carotid plaque with bilateral bruits. Will update carotid dopplers.     I will follow up in 6 months

## 2018-09-25 ENCOUNTER — Encounter: Payer: Self-pay | Admitting: Cardiology

## 2018-09-25 ENCOUNTER — Other Ambulatory Visit: Payer: Self-pay

## 2018-09-25 ENCOUNTER — Ambulatory Visit (INDEPENDENT_AMBULATORY_CARE_PROVIDER_SITE_OTHER): Payer: Medicare Other | Admitting: Cardiology

## 2018-09-25 VITALS — BP 174/76 | HR 86 | Temp 97.3°F | Ht 60.75 in | Wt 150.4 lb

## 2018-09-25 DIAGNOSIS — I480 Paroxysmal atrial fibrillation: Secondary | ICD-10-CM

## 2018-09-25 DIAGNOSIS — E7800 Pure hypercholesterolemia, unspecified: Secondary | ICD-10-CM | POA: Diagnosis not present

## 2018-09-25 DIAGNOSIS — I10 Essential (primary) hypertension: Secondary | ICD-10-CM

## 2018-09-25 DIAGNOSIS — R0989 Other specified symptoms and signs involving the circulatory and respiratory systems: Secondary | ICD-10-CM

## 2018-09-25 NOTE — Patient Instructions (Signed)
Medication Instructions:  Continue same medications If you need a refill on your cardiac medications before your next appointment, please call your pharmacy.   Lab work: None ordered   Testing/Procedures: Schedule carotid dopplers  Follow-Up: At [facility], you and your health needs are our priority.  As part of our continuing mission to provide you with exceptional heart care, we have created designated Provider Care Teams.  These Care Teams include your primary Cardiologist (physician) and Advanced Practice Providers (APPs -  Physician Assistants and Nurse Practitioners) who all work together to provide you with the care you need, when you need it. . Schedule follow up appointment in 6 months    Call in Dec to schedule March appointment

## 2018-10-01 ENCOUNTER — Other Ambulatory Visit: Payer: Self-pay

## 2018-10-01 ENCOUNTER — Ambulatory Visit (HOSPITAL_COMMUNITY)
Admission: RE | Admit: 2018-10-01 | Discharge: 2018-10-01 | Disposition: A | Discharge status: Home / Self Care | Payer: Medicare Other | Source: Ambulatory Visit | Attending: Cardiology | Admitting: Cardiology

## 2018-10-01 DIAGNOSIS — R0989 Other specified symptoms and signs involving the circulatory and respiratory systems: Secondary | ICD-10-CM | POA: Insufficient documentation

## 2018-10-01 DIAGNOSIS — E7800 Pure hypercholesterolemia, unspecified: Secondary | ICD-10-CM | POA: Insufficient documentation

## 2018-10-01 DIAGNOSIS — I480 Paroxysmal atrial fibrillation: Secondary | ICD-10-CM | POA: Diagnosis not present

## 2018-10-01 DIAGNOSIS — I10 Essential (primary) hypertension: Secondary | ICD-10-CM | POA: Diagnosis not present

## 2018-10-08 DIAGNOSIS — Z23 Encounter for immunization: Secondary | ICD-10-CM | POA: Diagnosis not present

## 2018-10-08 DIAGNOSIS — Z7901 Long term (current) use of anticoagulants: Secondary | ICD-10-CM | POA: Diagnosis not present

## 2018-10-08 DIAGNOSIS — I480 Paroxysmal atrial fibrillation: Secondary | ICD-10-CM | POA: Diagnosis not present

## 2018-10-28 ENCOUNTER — Ambulatory Visit
Admission: RE | Admit: 2018-10-28 | Discharge: 2018-10-28 | Disposition: A | Discharge status: Home / Self Care | Payer: Medicare Other | Source: Ambulatory Visit | Attending: Internal Medicine | Admitting: Internal Medicine

## 2018-10-28 ENCOUNTER — Other Ambulatory Visit: Payer: Self-pay

## 2018-10-28 DIAGNOSIS — Z1231 Encounter for screening mammogram for malignant neoplasm of breast: Secondary | ICD-10-CM | POA: Diagnosis not present

## 2018-10-29 ENCOUNTER — Other Ambulatory Visit: Payer: Self-pay | Admitting: Internal Medicine

## 2018-10-29 DIAGNOSIS — R928 Other abnormal and inconclusive findings on diagnostic imaging of breast: Secondary | ICD-10-CM

## 2018-10-30 DIAGNOSIS — S80811A Abrasion, right lower leg, initial encounter: Secondary | ICD-10-CM | POA: Diagnosis not present

## 2018-11-02 ENCOUNTER — Ambulatory Visit
Admission: RE | Admit: 2018-11-02 | Discharge: 2018-11-02 | Disposition: A | Discharge status: Home / Self Care | Payer: Medicare Other | Source: Ambulatory Visit | Attending: Internal Medicine | Admitting: Internal Medicine

## 2018-11-02 ENCOUNTER — Other Ambulatory Visit: Payer: Self-pay

## 2018-11-02 DIAGNOSIS — R928 Other abnormal and inconclusive findings on diagnostic imaging of breast: Secondary | ICD-10-CM

## 2018-11-02 DIAGNOSIS — R922 Inconclusive mammogram: Secondary | ICD-10-CM | POA: Diagnosis not present

## 2018-11-02 DIAGNOSIS — N6001 Solitary cyst of right breast: Secondary | ICD-10-CM | POA: Diagnosis not present

## 2018-11-10 DIAGNOSIS — Z7901 Long term (current) use of anticoagulants: Secondary | ICD-10-CM | POA: Diagnosis not present

## 2018-11-10 DIAGNOSIS — I480 Paroxysmal atrial fibrillation: Secondary | ICD-10-CM | POA: Diagnosis not present

## 2018-11-10 DIAGNOSIS — K3184 Gastroparesis: Secondary | ICD-10-CM | POA: Diagnosis not present

## 2018-11-30 ENCOUNTER — Other Ambulatory Visit: Payer: Self-pay | Admitting: Cardiology

## 2018-12-08 DIAGNOSIS — Z5189 Encounter for other specified aftercare: Secondary | ICD-10-CM | POA: Diagnosis not present

## 2018-12-08 DIAGNOSIS — Z7901 Long term (current) use of anticoagulants: Secondary | ICD-10-CM | POA: Diagnosis not present

## 2018-12-08 DIAGNOSIS — G479 Sleep disorder, unspecified: Secondary | ICD-10-CM | POA: Diagnosis not present

## 2018-12-08 DIAGNOSIS — K219 Gastro-esophageal reflux disease without esophagitis: Secondary | ICD-10-CM | POA: Diagnosis not present

## 2018-12-08 DIAGNOSIS — K3184 Gastroparesis: Secondary | ICD-10-CM | POA: Diagnosis not present

## 2018-12-08 DIAGNOSIS — I480 Paroxysmal atrial fibrillation: Secondary | ICD-10-CM | POA: Diagnosis not present

## 2019-01-12 DIAGNOSIS — Z7901 Long term (current) use of anticoagulants: Secondary | ICD-10-CM | POA: Diagnosis not present

## 2019-01-12 DIAGNOSIS — I480 Paroxysmal atrial fibrillation: Secondary | ICD-10-CM | POA: Diagnosis not present

## 2019-01-12 DIAGNOSIS — K3184 Gastroparesis: Secondary | ICD-10-CM | POA: Diagnosis not present

## 2019-01-26 ENCOUNTER — Ambulatory Visit: Payer: Medicare Other | Attending: Internal Medicine

## 2019-01-26 DIAGNOSIS — Z23 Encounter for immunization: Secondary | ICD-10-CM | POA: Diagnosis not present

## 2019-01-26 NOTE — Progress Notes (Signed)
   Covid-19 Vaccination Clinic  Name:  Gloria [name]    MRN: [id number] DOB: January 13, 1930  01/26/2019  Ms. Minervini was observed post Covid-19 immunization for 15 minutes without incidence. She was provided with Vaccine Information Sheet and instruction to access the V-Safe system.   Ms. Welford was instructed to call 911 with any severe reactions post vaccine: Marland Kitchen Difficulty breathing  . Swelling of your face and throat  . A fast heartbeat  . A bad rash all over your body  . Dizziness and weakness    Immunizations Administered    Name Date Dose VIS Date Route   Pfizer COVID-19 Vaccine 01/26/2019  1:33 PM 0.3 mL 12/18/2018 Intramuscular   Manufacturer: [place]   Lot: [id number]   [place]: [id number]

## 2019-02-09 DIAGNOSIS — Z7901 Long term (current) use of anticoagulants: Secondary | ICD-10-CM | POA: Diagnosis not present

## 2019-02-09 DIAGNOSIS — I480 Paroxysmal atrial fibrillation: Secondary | ICD-10-CM | POA: Diagnosis not present

## 2019-02-15 ENCOUNTER — Ambulatory Visit: Payer: Medicare Other | Attending: Internal Medicine

## 2019-02-15 DIAGNOSIS — Z23 Encounter for immunization: Secondary | ICD-10-CM | POA: Insufficient documentation

## 2019-02-15 NOTE — Progress Notes (Signed)
   Covid-19 Vaccination Clinic  Name:  KAROLYNA BRISCOE    MRN: [id number] DOB: 11-27-1930  02/15/2019  Ms. Casasanta was observed post Covid-19 immunization for 15 minutes without incidence. She was provided with Vaccine Information Sheet and instruction to access the V-Safe system.   Ms. Chachere was instructed to call 911 with any severe reactions post vaccine: Marland Kitchen Difficulty breathing  . Swelling of your face and throat  . A fast heartbeat  . A bad rash all over your body  . Dizziness and weakness    Immunizations Administered    Name Date Dose VIS Date Route   Pfizer COVID-19 Vaccine 02/15/2019  1:10 PM 0.3 mL 12/18/2018 Intramuscular   Manufacturer: [place]   Lot: [id number]   [place]: [id number]

## 2019-02-26 ENCOUNTER — Other Ambulatory Visit: Payer: Self-pay | Admitting: Cardiology

## 2019-03-01 DIAGNOSIS — M545 Low back pain: Secondary | ICD-10-CM | POA: Diagnosis not present

## 2019-03-01 DIAGNOSIS — M5417 Radiculopathy, lumbosacral region: Secondary | ICD-10-CM | POA: Diagnosis not present

## 2019-03-08 DIAGNOSIS — M545 Low back pain: Secondary | ICD-10-CM | POA: Diagnosis not present

## 2019-03-08 DIAGNOSIS — M5417 Radiculopathy, lumbosacral region: Secondary | ICD-10-CM | POA: Diagnosis not present

## 2019-03-11 DIAGNOSIS — M545 Low back pain: Secondary | ICD-10-CM | POA: Diagnosis not present

## 2019-03-16 DIAGNOSIS — Z7901 Long term (current) use of anticoagulants: Secondary | ICD-10-CM | POA: Diagnosis not present

## 2019-03-16 DIAGNOSIS — I480 Paroxysmal atrial fibrillation: Secondary | ICD-10-CM | POA: Diagnosis not present

## 2019-03-17 DIAGNOSIS — H0014 Chalazion left upper eyelid: Secondary | ICD-10-CM | POA: Diagnosis not present

## 2019-03-17 DIAGNOSIS — H01004 Unspecified blepharitis left upper eyelid: Secondary | ICD-10-CM | POA: Diagnosis not present

## 2019-03-24 DIAGNOSIS — Z7901 Long term (current) use of anticoagulants: Secondary | ICD-10-CM | POA: Diagnosis not present

## 2019-03-24 DIAGNOSIS — I480 Paroxysmal atrial fibrillation: Secondary | ICD-10-CM | POA: Diagnosis not present

## 2019-03-24 DIAGNOSIS — Z7902 Long term (current) use of antithrombotics/antiplatelets: Secondary | ICD-10-CM | POA: Diagnosis not present

## 2019-03-24 DIAGNOSIS — M545 Low back pain: Secondary | ICD-10-CM | POA: Diagnosis not present

## 2019-03-24 NOTE — Progress Notes (Signed)
Virtual Visit via Telephone Note   This visit type was conducted due to national recommendations for restrictions regarding the COVID-19 Pandemic (e.g. social distancing) in an effort to limit this patient's exposure and mitigate transmission in our community.  Due to her co-morbid illnesses, this patient is at least at moderate risk for complications without adequate follow up.  This format is felt to be most appropriate for this patient at this time.  The patient did not have access to video technology/had technical difficulties with video requiring transitioning to audio format only (telephone).  All issues noted in this document were discussed and addressed.  No physical exam could be performed with this format.  Please refer to the patient's chart for her  consent to telehealth for [facility].   The patient was identified using 2 identifiers.  Date:  03/29/2019   ID:  Gloria [name], DOB 03-01-1930, MRN [id number]  Patient Location: Home Provider Location: Home  PCP:  Leanna Battles, MD  Cardiologist:  Averie Hornbaker [place], MD  Electrophysiologist:  None   Evaluation Performed:  Follow-Up Visit  Chief Complaint:  Afib  History of Present Illness:    Gloria [name] is a 86 y.o. female with a history of atrial fibrillation controlled with flecainide. She is on chronic anticoagulation with coumadin. She tried Xarelto in the past but this upset her stomach.   She was admitted in May 2018 with symptoms of chest pressure and SOB. She ruled out for MI. Echo showed moderate LVH and LAE with normal systolic function. Myoview study was normal. She was placed on amlodipine for HTN. Subsequently she developed lower extremity swelling with a rash and red blotches. Amlodipine was stopped and this resolved.   She did have dental Xray in June which showed some calcification in her carotid artery. Last dopplers in June 2018 showed less than 39% stenosis.  On follow up  Today she is doing very  well. Has had both vaccine shots. No chest pain, palpitations, dizziness. Tolerating medication well. Is due for complete physical with primary care next week. No bleeding.   The patient does not have symptoms concerning for COVID-19 infection (fever, chills, cough, or new shortness of breath).    Past Medical History:  Diagnosis Date  . Arthritis    right  . Atrial fibrillation ([place])   . GERD (gastroesophageal reflux disease)    hx of  . Hepatitis C   . Humerus fracture   . Hyperlipidemia   . Hypertension   . Irritable bowel syndrome   . Meningioma ([place])   . PONV (postoperative nausea and vomiting)    Past Surgical History:  Procedure Laterality Date  . BREAST SURGERY     lumpectomy  . CARDIOVASCULAR STRESS TEST  11/14/2006   EF 67%  . COLONOSCOPY W/ BIOPSIES AND POLYPECTOMY    . ORIF HUMERUS FRACTURE Right 04/18/2014   Procedure: OPEN REDUCTION INTERNAL FIXATION (ORIF) RIGHT PROXIMAL HUMERUS FRACTURE;  Surgeon: Netta Cedars, MD;  Location: [facility];  Service: Orthopedics;  Laterality: Right;  . ORIF PERIPROSTHETIC FRACTURE Left 04/30/2013   Procedure: OPEN REDUCTION INTERNAL FIXATION (ORIF) PERIPROSTHETIC FRACTURE;  Surgeon: [name], MD;  Location: [facility];  Service: Orthopedics;  Laterality: Left;  . PAROTID GLAND TUMOR EXCISION    . TOTAL HIP ARTHROPLASTY  11/07   left  . TOTAL KNEE ARTHROPLASTY    . TOTAL KNEE ARTHROPLASTY Left 11/24/2017   Procedure: TOTAL KNEE ARTHROPLASTY;  Surgeon: Gaynelle Arabian, MD;  [facility];  Service: Orthopedics;  Laterality: Left;  40min  . TRANSTHORACIC ECHOCARDIOGRAM  10/25/2009   EF 55-60%     Current Meds  Medication Sig  . acetaminophen (TYLENOL) 500 MG tablet Take 500 mg by mouth every 6 (six) hours as needed for mild pain or headache.   . Artificial Tear Ointment (DRY EYES OP) Place 1 drop into both eyes daily at 2 PM.  . atorvastatin (LIPITOR) 10 MG tablet Take 10 mg by mouth at bedtime.  . ergocalciferol (VITAMIN D2) 1.25  MG (50000 UT) capsule Take 50,000 Units by mouth every Friday.  . famotidine (PEPCID) 20 MG tablet Take daily as needed  . flecainide (TAMBOCOR) 50 MG tablet Take 1 tablet (50 mg total) by mouth 2 (two) times daily.  . hydrochlorothiazide (HYDRODIURIL) 25 MG tablet Take 1 tablet by mouth once daily  . losartan (COZAAR) 25 MG tablet Take 1 tablet by mouth once daily  . Multiple Vitamins-Minerals (PRESERVISION AREDS PO) Take 1 capsule by mouth 2 (two) times daily.  . nitroGLYCERIN (NITROSTAT) 0.4 MG SL tablet Place 1 tablet (0.4 mg total) under the tongue every 5 (five) minutes as needed for chest pain.  Marland Kitchen omeprazole (PRILOSEC) 20 MG capsule Take 20 mg by mouth 2 (two) times daily.   . polyethylene glycol (MIRALAX / GLYCOLAX) packet Take 17 g by mouth daily as needed for moderate constipation.  . vitamin B-12 (CYANOCOBALAMIN) 1000 MCG tablet Take 1,000 mcg by mouth every evening.   . warfarin (COUMADIN) 6 MG tablet Take 3-6 tablets by mouth See admin instructions. Take 6 mg by mouth daily on Monday, Wednesday  Take 3 mg by mouth daily on all other days.     Allergies:   Amlodipine, Cephalexin, Codeine, and Metoclopramide hcl   Social History   Tobacco Use  . Smoking status: Former Smoker    Packs/day: 0.30    Years: 15.00    Pack years: 4.50    Types: Cigarettes    Quit date: 06/07/1980    Years since quitting: 38.8  . Smokeless tobacco: Never Used  Substance Use Topics  . Alcohol use: No  . Drug use: No     Family Hx: The patient's family history includes Heart disease in her brother. There is no history of Breast cancer.  ROS:   Please see the history of present illness.    All other systems reviewed and are negative.   Prior CV studies:   The following studies were reviewed today:  view 06/05/16: Study Result     The study is normal.  This is a low risk study.  The left ventricular ejection fraction is hyperdynamic (>65%).  There was no ST segment deviation noted  during stress.  Normal resting and stress perfusion. No ischemia or infarction EF 72%    Echo 06/05/16: Study Conclusions  - Left ventricle: The cavity size was normal. Wall thickness was increased in a pattern of mild LVH. There was moderate concentric hypertrophy. Systolic function was normal. The estimated ejection fraction was in the range of 55% to 60%. - Left atrium: The atrium was moderately dilated. - Atrial septum: No defect or patent foramen ovale was identified.   Labs/Other Tests and Data Reviewed:    EKG:  No ECG reviewed.  Recent Labs: No results found for requested labs within last 8760 hours.   Recent Lipid Panel Lab Results  Component Value Date/Time   CHOL 178 07/14/2016 05:36 AM   TRIG 69 07/14/2016 05:36 AM   HDL 58  07/14/2016 05:36 AM   CHOLHDL 3.1 07/14/2016 05:36 AM   LDLCALC 106 (H) 07/14/2016 05:36 AM   Dated 03/24/18: cholesterol 172, triglycerides 70, HDL 63, LDL 95. CMET and CBC normal  Wt Readings from Last 3 Encounters:  03/29/19 155 lb (70.3 kg)  09/25/18 150 lb 6.4 oz (68.2 kg)  02/10/18 141 lb 3.2 oz (64 kg)     Objective:    Vital Signs:  BP 136/68   Pulse (!) 56   Ht 5\' 5"  (1.651 m)   Wt 155 lb (70.3 kg)   BMI 25.79 kg/m    VITAL SIGNS:  reviewed  ASSESSMENT & PLAN:    1. Atrial fibrillation- paroxysmal. This is well controlled on low dose flecainide. Appears to be maintaining NSR today.We will continue her current therapy. On coumadin.   2. Chronic anticoagulation, therapeutic.   3. Hypertension, BP is well controlled.  4. Carotid plaque with bilateral bruits. Will update carotid dopplers.     COVID-19 Education: The signs and symptoms of COVID-19 were discussed with the patient and how to seek care for testing (follow up with PCP or arrange E-visit).  The importance of social distancing was discussed today.  Time:   Today, I have spent 10 minutes with the patient with telehealth technology discussing  the above problems.     Medication Adjustments/Labs and Tests Ordered: Current medicines are reviewed at length with the patient today.  Concerns regarding medicines are outlined above.   Tests Ordered: No orders of the defined types were placed in this encounter.   Medication Changes: No orders of the defined types were placed in this encounter.   Follow Up:  In Person in 1 year(s)  Signed, Joyclyn Plazola [place], MD  03/29/2019 9:15 AM    [place]

## 2019-03-29 ENCOUNTER — Ambulatory Visit: Payer: Medicare Other | Admitting: Cardiology

## 2019-03-29 ENCOUNTER — Telehealth (INDEPENDENT_AMBULATORY_CARE_PROVIDER_SITE_OTHER): Payer: Medicare Other | Admitting: Cardiology

## 2019-03-29 ENCOUNTER — Encounter: Payer: Self-pay | Admitting: Cardiology

## 2019-03-29 VITALS — BP 136/68 | HR 56 | Ht 65.0 in | Wt 155.0 lb

## 2019-03-29 DIAGNOSIS — Z7901 Long term (current) use of anticoagulants: Secondary | ICD-10-CM | POA: Diagnosis not present

## 2019-03-29 DIAGNOSIS — I6523 Occlusion and stenosis of bilateral carotid arteries: Secondary | ICD-10-CM

## 2019-03-29 DIAGNOSIS — I10 Essential (primary) hypertension: Secondary | ICD-10-CM

## 2019-03-29 DIAGNOSIS — I480 Paroxysmal atrial fibrillation: Secondary | ICD-10-CM

## 2019-03-29 NOTE — Patient Instructions (Signed)
Medication Instructions:  Continue same medications *If you need a refill on your cardiac medications before your next appointment, please call your pharmacy*   Lab Work: None ordered    Testing/Procedures: None ordered   Follow-Up: At [facility], you and your health needs are our priority.  As part of our continuing mission to provide you with exceptional heart care, we have created designated Provider Care Teams.  These Care Teams include your primary Cardiologist (physician) and Advanced Practice Providers (APPs -  Physician Assistants and Nurse Practitioners) who all work together to provide you with the care you need, when you need it.  We recommend signing up for the patient portal called "MyChart".  Sign up information is provided on this After Visit Summary.  MyChart is used to connect with patients for Virtual Visits (Telemedicine).  Patients are able to view lab/test results, encounter notes, upcoming appointments, etc.  Non-urgent messages can be sent to your provider as well.   To learn more about what you can do with MyChart, go to [email or web address].      Your next appointment:  1 year      Call in November to schedule March appointment    The format for your next appointment: Office     Provider:  Dr.Jordan

## 2019-03-31 DIAGNOSIS — I480 Paroxysmal atrial fibrillation: Secondary | ICD-10-CM | POA: Diagnosis not present

## 2019-03-31 DIAGNOSIS — Z7902 Long term (current) use of antithrombotics/antiplatelets: Secondary | ICD-10-CM | POA: Diagnosis not present

## 2019-03-31 DIAGNOSIS — Z7901 Long term (current) use of anticoagulants: Secondary | ICD-10-CM | POA: Diagnosis not present

## 2019-04-01 DIAGNOSIS — M5136 Other intervertebral disc degeneration, lumbar region: Secondary | ICD-10-CM | POA: Diagnosis not present

## 2019-04-06 DIAGNOSIS — I480 Paroxysmal atrial fibrillation: Secondary | ICD-10-CM | POA: Diagnosis not present

## 2019-04-06 DIAGNOSIS — Z7901 Long term (current) use of anticoagulants: Secondary | ICD-10-CM | POA: Diagnosis not present

## 2019-04-06 DIAGNOSIS — Z7902 Long term (current) use of antithrombotics/antiplatelets: Secondary | ICD-10-CM | POA: Diagnosis not present

## 2019-04-13 DIAGNOSIS — E7849 Other hyperlipidemia: Secondary | ICD-10-CM | POA: Diagnosis not present

## 2019-04-13 DIAGNOSIS — M859 Disorder of bone density and structure, unspecified: Secondary | ICD-10-CM | POA: Diagnosis not present

## 2019-04-20 DIAGNOSIS — R2681 Unsteadiness on feet: Secondary | ICD-10-CM | POA: Diagnosis not present

## 2019-04-20 DIAGNOSIS — N183 Chronic kidney disease, stage 3 unspecified: Secondary | ICD-10-CM | POA: Diagnosis not present

## 2019-04-20 DIAGNOSIS — R82998 Other abnormal findings in urine: Secondary | ICD-10-CM | POA: Diagnosis not present

## 2019-04-20 DIAGNOSIS — E785 Hyperlipidemia, unspecified: Secondary | ICD-10-CM | POA: Diagnosis not present

## 2019-04-20 DIAGNOSIS — Z0000 Encounter for general adult medical examination without abnormal findings: Secondary | ICD-10-CM | POA: Diagnosis not present

## 2019-04-20 DIAGNOSIS — K219 Gastro-esophageal reflux disease without esophagitis: Secondary | ICD-10-CM | POA: Diagnosis not present

## 2019-04-20 DIAGNOSIS — M545 Low back pain: Secondary | ICD-10-CM | POA: Diagnosis not present

## 2019-04-20 DIAGNOSIS — M8580 Other specified disorders of bone density and structure, unspecified site: Secondary | ICD-10-CM | POA: Diagnosis not present

## 2019-04-20 DIAGNOSIS — Z1339 Encounter for screening examination for other mental health and behavioral disorders: Secondary | ICD-10-CM | POA: Diagnosis not present

## 2019-04-20 DIAGNOSIS — I10 Essential (primary) hypertension: Secondary | ICD-10-CM | POA: Diagnosis not present

## 2019-04-20 DIAGNOSIS — I480 Paroxysmal atrial fibrillation: Secondary | ICD-10-CM | POA: Diagnosis not present

## 2019-04-20 DIAGNOSIS — Z1331 Encounter for screening for depression: Secondary | ICD-10-CM | POA: Diagnosis not present

## 2019-04-20 DIAGNOSIS — I1310 Hypertensive heart and chronic kidney disease without heart failure, with stage 1 through stage 4 chronic kidney disease, or unspecified chronic kidney disease: Secondary | ICD-10-CM | POA: Diagnosis not present

## 2019-04-20 DIAGNOSIS — Z7901 Long term (current) use of anticoagulants: Secondary | ICD-10-CM | POA: Diagnosis not present

## 2019-05-05 DIAGNOSIS — I480 Paroxysmal atrial fibrillation: Secondary | ICD-10-CM | POA: Diagnosis not present

## 2019-05-05 DIAGNOSIS — Z7901 Long term (current) use of anticoagulants: Secondary | ICD-10-CM | POA: Diagnosis not present

## 2019-06-08 DIAGNOSIS — Z7901 Long term (current) use of anticoagulants: Secondary | ICD-10-CM | POA: Diagnosis not present

## 2019-06-08 DIAGNOSIS — I480 Paroxysmal atrial fibrillation: Secondary | ICD-10-CM | POA: Diagnosis not present

## 2019-08-02 DIAGNOSIS — Z7901 Long term (current) use of anticoagulants: Secondary | ICD-10-CM | POA: Diagnosis not present

## 2019-08-02 DIAGNOSIS — I480 Paroxysmal atrial fibrillation: Secondary | ICD-10-CM | POA: Diagnosis not present

## 2019-08-10 DIAGNOSIS — H6121 Impacted cerumen, right ear: Secondary | ICD-10-CM | POA: Diagnosis not present

## 2019-08-30 DIAGNOSIS — Z7901 Long term (current) use of anticoagulants: Secondary | ICD-10-CM | POA: Diagnosis not present

## 2019-08-30 DIAGNOSIS — I480 Paroxysmal atrial fibrillation: Secondary | ICD-10-CM | POA: Diagnosis not present

## 2019-09-20 ENCOUNTER — Encounter (INDEPENDENT_AMBULATORY_CARE_PROVIDER_SITE_OTHER): Payer: Medicare Other | Admitting: Ophthalmology

## 2019-09-21 ENCOUNTER — Encounter (INDEPENDENT_AMBULATORY_CARE_PROVIDER_SITE_OTHER): Payer: Medicare Other | Admitting: Ophthalmology

## 2019-09-24 ENCOUNTER — Other Ambulatory Visit: Payer: Self-pay | Admitting: Internal Medicine

## 2019-09-24 DIAGNOSIS — Z1231 Encounter for screening mammogram for malignant neoplasm of breast: Secondary | ICD-10-CM

## 2019-09-30 NOTE — Progress Notes (Signed)
[facility] Note  10/04/2019     CHIEF COMPLAINT Patient presents for Retina Evaluation   HISTORY OF PRESENT ILLNESS: Gloria [name] is a 86 y.o. female who presents to the clinic today for:   HPI    Retina Evaluation    In both eyes.  This started 6 months ago.  Duration of 6 months.  Context:  distance vision and near vision.  I, the attending physician,  performed the HPI with the patient and updated documentation appropriately.          Comments    Hx of CEIOL OU (Dr. Kathrin Penner) Hx of YAG cap (Dr. Kathrin Penner) Pt states she has noticed a decrease in her vision over the last 6 months or so.  Patient denies eye pain or discomfort and denies any new or worsening floaters or fol OU.       Last edited by Bernarda Caffey, MD on 10/04/2019  1:32 PM. (History)      Referring physician: Shon Hough, MD [place],  [place] [id number]  HISTORICAL INFORMATION:   Selected notes from the MEDICAL RECORD NUMBER Referred by Dr. Kathrin Penner LEE: 06-28-19 Ocular Hx- ARMD OU PMH-    CURRENT MEDICATIONS: Current Outpatient Medications (Ophthalmic Drugs)  Medication Sig  . Artificial Tear Ointment (DRY EYES OP) Place 1 drop into both eyes daily at 2 PM.   No current facility-administered medications for this visit. (Ophthalmic Drugs)   Current Outpatient Medications (Other)  Medication Sig  . acetaminophen (TYLENOL) 500 MG tablet Take 500 mg by mouth every 6 (six) hours as needed for mild pain or headache.   Marland Kitchen atorvastatin (LIPITOR) 10 MG tablet Take 10 mg by mouth at bedtime.  . ergocalciferol (VITAMIN D2) 1.25 MG (50000 UT) capsule Take 50,000 Units by mouth every Friday.  . famotidine (PEPCID) 20 MG tablet Take daily as needed  . flecainide (TAMBOCOR) 50 MG tablet Take 1 tablet (50 mg total) by mouth 2 (two) times daily.  . hydrochlorothiazide (HYDRODIURIL) 25 MG tablet Take 1 tablet by mouth once daily  . losartan (COZAAR) 25 MG  tablet Take 1 tablet by mouth once daily  . Multiple Vitamins-Minerals (PRESERVISION AREDS PO) Take 1 capsule by mouth 2 (two) times daily.  . nitroGLYCERIN (NITROSTAT) 0.4 MG SL tablet Place 1 tablet (0.4 mg total) under the tongue every 5 (five) minutes as needed for chest pain.  Marland Kitchen omeprazole (PRILOSEC) 20 MG capsule Take 20 mg by mouth 2 (two) times daily.   . polyethylene glycol (MIRALAX / GLYCOLAX) packet Take 17 g by mouth daily as needed for moderate constipation.  . vitamin B-12 (CYANOCOBALAMIN) 1000 MCG tablet Take 1,000 mcg by mouth every evening.   . warfarin (COUMADIN) 6 MG tablet Take 3-6 tablets by mouth See admin instructions. Take 6 mg by mouth daily on Monday, Wednesday  Take 3 mg by mouth daily on all other days.   No current facility-administered medications for this visit. (Other)      REVIEW OF SYSTEMS: ROS    Positive for: Cardiovascular, Eyes   Negative for: Constitutional, Gastrointestinal, Neurological, Skin, Genitourinary, Musculoskeletal, HENT, Endocrine, Respiratory, Psychiatric, Allergic/Imm, Heme/Lymph   Last edited by Doneen Poisson on 10/04/2019  1:23 PM. (History)       ALLERGIES Allergies  Allergen Reactions  . Amlodipine Swelling  . Cephalexin Other (See Comments)    unknown  . Codeine Other (See Comments)    unknown  . Metoclopramide Hcl Other (  See Comments)    unknown    PAST MEDICAL HISTORY Past Medical History:  Diagnosis Date  . Arthritis    right  . Atrial fibrillation ([place])   . GERD (gastroesophageal reflux disease)    hx of  . Hepatitis C   . Humerus fracture   . Hyperlipidemia   . Hypertension   . Irritable bowel syndrome   . Meningioma ([place])   . PONV (postoperative nausea and vomiting)    Past Surgical History:  Procedure Laterality Date  . BREAST SURGERY     lumpectomy  . CARDIOVASCULAR STRESS TEST  11/14/2006   EF 67%  . COLONOSCOPY W/ BIOPSIES AND POLYPECTOMY    . ORIF HUMERUS FRACTURE Right 04/18/2014    Procedure: OPEN REDUCTION INTERNAL FIXATION (ORIF) RIGHT PROXIMAL HUMERUS FRACTURE;  Surgeon: Netta Cedars, MD;  Location: [facility];  Service: Orthopedics;  Laterality: Right;  . ORIF PERIPROSTHETIC FRACTURE Left 04/30/2013   Procedure: OPEN REDUCTION INTERNAL FIXATION (ORIF) PERIPROSTHETIC FRACTURE;  Surgeon: [name], MD;  Location: [facility];  Service: Orthopedics;  Laterality: Left;  . PAROTID GLAND TUMOR EXCISION    . TOTAL HIP ARTHROPLASTY  11/07   left  . TOTAL KNEE ARTHROPLASTY    . TOTAL KNEE ARTHROPLASTY Left 11/24/2017   Procedure: TOTAL KNEE ARTHROPLASTY;  Surgeon: Gaynelle Arabian, MD;  Location: [facility];  Service: Orthopedics;  Laterality: Left;  39min  . TRANSTHORACIC ECHOCARDIOGRAM  10/25/2009   EF 55-60%    FAMILY HISTORY Family History  Problem Relation Age of Onset  . Heart disease Brother   . Breast cancer Neg Hx     SOCIAL HISTORY Social History   Tobacco Use  . Smoking status: Former Smoker    Packs/day: 0.30    Years: 15.00    Pack years: 4.50    Types: Cigarettes    Quit date: 06/07/1980    Years since quitting: 39.3  . Smokeless tobacco: Never Used  Vaping Use  . Vaping Use: Never used  Substance Use Topics  . Alcohol use: No  . Drug use: No         OPHTHALMIC EXAM:  Base Eye Exam    Visual Acuity (Snellen - Linear)      Right Left   Dist cc 20/60 -2 20/70 -1   Dist ph cc NI NI   Correction: Glasses       Tonometry (Tonopen, 1:24 PM)      Right Left   Pressure 12 13       Pupils      Dark Light Shape React APD   Right 4 3 Round Brisk 0   Left 4 3 Round Brisk 0       Visual Fields      Left Right    Full Full       Extraocular Movement      Right Left    Full Full       Neuro/Psych    Oriented x3: Yes   Mood/Affect: Normal       Dilation    Both eyes: 1.0% Mydriacyl, 2.5% Phenylephrine @ 1:24 PM        Slit Lamp and Fundus Exam    Slit Lamp Exam      Right Left   Lids/Lashes Dermatochalasis - upper lid, Meibomian  gland dysfunction Dermatochalasis - upper lid, Meibomian gland dysfunction   Conjunctiva/Sclera white, quiet white, quiet   Cornea +flecks; mild haze; mild guttata; well healed cataract wounds +flecks; mild haze;  mild guttata; well healed cataract wounds   Anterior Chamber deep; clear deep; clear   Iris round dilated round dilated   Lens PCIOL; open PC PCIOL; open PC   Vitreous syneresis syneresis       Fundus Exam      Right Left   Disc mild pallor; sharp rim mild pallor; sharp rim   C/D Ratio 0.2 0.2   Macula flat; drusen; central PEDs; blunted foveal reflex; RPE mottling and clumping flat; blunted foveal reflex; drusen; RPE mottling and clumping   Vessels attenuated; tortuous attenuated; tortuous   Periphery attached; reticular degen; mild peripheral drusen attached; reticular degen; mild peripheral drusen        Refraction    Wearing Rx      Sphere Cylinder Axis Add   Right -0.50 +1.25 010 +3.00   Left -0.75 +2.25 006 +3.00   Type: prog       Manifest Refraction      Sphere Cylinder Axis Dist VA   Right -0.50 +1.25 025 20/60-2   Left -1.25 +2.00 005 20/60-2          IMAGING AND PROCEDURES  Imaging and Procedures for 10/04/2019  OCT, Retina - OU - Both Eyes       Right Eye Quality was good. Central Foveal Thickness: 324. Progression has no prior data. Findings include normal foveal contour, no IRF, no SRF, retinal drusen , pigment epithelial detachment, outer retinal atrophy.   Left Eye Quality was good. Central Foveal Thickness: 324. Progression has no prior data. Findings include normal foveal contour, no IRF, no SRF, pigment epithelial detachment, retinal drusen .   Notes *Images captured and stored on drive  Diagnosis / Impression:  Non-exu ARMD OU  Central PEDs OU (OD>OS)  Clinical management:  See below  Abbreviations: NFP - Normal foveal profile. CME - cystoid macular edema. PED - pigment epithelial detachment. IRF - intraretinal fluid. SRF -  subretinal fluid. EZ - ellipsoid zone. ERM - epiretinal membrane. ORA - outer retinal atrophy. ORT - outer retinal tubulation. SRHM - subretinal hyper-reflective material. IRHM - intraretinal hyper-reflective material                 ASSESSMENT/PLAN:    ICD-10-CM   1. Intermediate stage nonexudative age-related macular degeneration of both eyes  H35.3132   2. Retinal edema  H35.81 OCT, Retina - OU - Both Eyes  3. Fleck corneal dystrophy  H18.599   4. Essential hypertension  I10   5. Hypertensive retinopathy of both eyes  H35.033   6. Pseudophakia of both eyes  Z96.1     1,2. Age related macular degeneration, non-exudative, OU  - Intermediate stage with prominent central PEDs OU (OD > OS)  - The incidence, anatomy, and pathology of dry AMD, risk of progression, and the AREDS and AREDS 2 studies including smoking risks discussed with patient.  - Recommend amsler grid monitoring  - f/u 3-4 mos  3. Fleck corneal dystrophy OU  - under the expert management of Dr. Kathrin Penner  4,5. Hypertensive retinopathy OU - discussed importance of tight BP control - monitor  6. Pseudophakia OU  - s/p CE/IOL OU  - IOLs in good position, doing well  - monitor   Ophthalmic Meds Ordered this visit:  No orders of the defined types were placed in this encounter.      No follow-ups on file.  There are no Patient Instructions on file for this visit.   Explained the diagnoses, plan, and follow up  with the patient and they expressed understanding.  Patient expressed understanding of the importance of proper follow up care.    This document serves as a record of services personally performed by Gardiner Sleeper, MD, PhD. It was created on their behalf by Leeann Must, [place], an ophthalmic technician. The creation of this record is the provider's dictation and/or activities during the visit.    Electronically signed by: Leeann Must, [place] 09.23.2021 4:06 PM   This document serves as a  record of services personally performed by Gardiner Sleeper, MD, PhD. It was created on their behalf by San Jetty. Owens Shark, OA an ophthalmic technician. The creation of this record is the provider's dictation and/or activities during the visit.    Electronically signed by: San Jetty. Owens Shark, [place] 09.27.2021 4:06 PM  Gardiner Sleeper, M.D., Ph.D. Diseases & Surgery of the Retina and [place] 10/04/2019   I have reviewed the above documentation for accuracy and completeness, and I agree with the above. Gardiner Sleeper, M.D., Ph.D. 10/04/19 4:09 PM   Abbreviations: M myopia (nearsighted); A astigmatism; H hyperopia (farsighted); P presbyopia; Mrx spectacle prescription;  CTL contact lenses; OD right eye; OS left eye; OU both eyes  XT exotropia; ET esotropia; PEK punctate epithelial keratitis; PEE punctate epithelial erosions; DES dry eye syndrome; MGD meibomian gland dysfunction; ATs artificial tears; PFAT's preservative free artificial tears; [place] nuclear sclerotic cataract; PSC posterior subcapsular cataract; ERM epi-retinal membrane; PVD posterior vitreous detachment; RD retinal detachment; DM diabetes mellitus; DR diabetic retinopathy; NPDR non-proliferative diabetic retinopathy; PDR proliferative diabetic retinopathy; CSME clinically significant macular edema; DME diabetic macular edema; dbh dot blot hemorrhages; CWS cotton wool spot; POAG primary open angle glaucoma; C/D cup-to-disc ratio; HVF humphrey visual field; GVF goldmann visual field; OCT optical coherence tomography; IOP intraocular pressure; BRVO Branch retinal vein occlusion; CRVO central retinal vein occlusion; CRAO central retinal artery occlusion; BRAO branch retinal artery occlusion; RT retinal tear; SB scleral buckle; PPV pars plana vitrectomy; VH Vitreous hemorrhage; PRP panretinal laser photocoagulation; IVK intravitreal kenalog; VMT vitreomacular traction; MH Macular hole;  NVD neovascularization of the  disc; NVE neovascularization elsewhere; AREDS age related eye disease study; ARMD age related macular degeneration; POAG primary open angle glaucoma; EBMD epithelial/anterior basement membrane dystrophy; ACIOL anterior chamber intraocular lens; IOL intraocular lens; PCIOL posterior chamber intraocular lens; Phaco/IOL phacoemulsification with intraocular lens placement; [place] photorefractive keratectomy; LASIK laser assisted in situ keratomileusis; HTN hypertension; DM diabetes mellitus; COPD chronic obstructive pulmonary disease

## 2019-10-04 ENCOUNTER — Ambulatory Visit (INDEPENDENT_AMBULATORY_CARE_PROVIDER_SITE_OTHER): Payer: Medicare Other | Admitting: Ophthalmology

## 2019-10-04 ENCOUNTER — Encounter (INDEPENDENT_AMBULATORY_CARE_PROVIDER_SITE_OTHER): Payer: Self-pay | Admitting: Ophthalmology

## 2019-10-04 ENCOUNTER — Other Ambulatory Visit: Payer: Self-pay

## 2019-10-04 DIAGNOSIS — H353132 Nonexudative age-related macular degeneration, bilateral, intermediate dry stage: Secondary | ICD-10-CM | POA: Diagnosis not present

## 2019-10-04 DIAGNOSIS — H18599 Other hereditary corneal dystrophies, unspecified eye: Secondary | ICD-10-CM | POA: Diagnosis not present

## 2019-10-04 DIAGNOSIS — Z961 Presence of intraocular lens: Secondary | ICD-10-CM | POA: Diagnosis not present

## 2019-10-04 DIAGNOSIS — H3581 Retinal edema: Secondary | ICD-10-CM | POA: Diagnosis not present

## 2019-10-04 DIAGNOSIS — H35033 Hypertensive retinopathy, bilateral: Secondary | ICD-10-CM | POA: Diagnosis not present

## 2019-10-04 DIAGNOSIS — I10 Essential (primary) hypertension: Secondary | ICD-10-CM

## 2019-10-05 DIAGNOSIS — Z7901 Long term (current) use of anticoagulants: Secondary | ICD-10-CM | POA: Diagnosis not present

## 2019-10-05 DIAGNOSIS — I480 Paroxysmal atrial fibrillation: Secondary | ICD-10-CM | POA: Diagnosis not present

## 2019-10-05 DIAGNOSIS — Z23 Encounter for immunization: Secondary | ICD-10-CM | POA: Diagnosis not present

## 2019-11-01 DIAGNOSIS — I480 Paroxysmal atrial fibrillation: Secondary | ICD-10-CM | POA: Diagnosis not present

## 2019-11-01 DIAGNOSIS — Z7901 Long term (current) use of anticoagulants: Secondary | ICD-10-CM | POA: Diagnosis not present

## 2019-11-01 DIAGNOSIS — M545 Low back pain, unspecified: Secondary | ICD-10-CM | POA: Diagnosis not present

## 2019-11-02 ENCOUNTER — Other Ambulatory Visit: Payer: Self-pay

## 2019-11-02 ENCOUNTER — Ambulatory Visit
Admission: RE | Admit: 2019-11-02 | Discharge: 2019-11-02 | Disposition: A | Discharge status: Home / Self Care | Payer: Medicare Other | Source: Ambulatory Visit | Attending: Internal Medicine | Admitting: Internal Medicine

## 2019-11-02 DIAGNOSIS — Z1231 Encounter for screening mammogram for malignant neoplasm of breast: Secondary | ICD-10-CM

## 2019-11-03 DIAGNOSIS — Z23 Encounter for immunization: Secondary | ICD-10-CM | POA: Diagnosis not present

## 2019-12-07 DIAGNOSIS — Z7901 Long term (current) use of anticoagulants: Secondary | ICD-10-CM | POA: Diagnosis not present

## 2019-12-07 DIAGNOSIS — I480 Paroxysmal atrial fibrillation: Secondary | ICD-10-CM | POA: Diagnosis not present

## 2020-01-05 DIAGNOSIS — Z7901 Long term (current) use of anticoagulants: Secondary | ICD-10-CM | POA: Diagnosis not present

## 2020-01-05 DIAGNOSIS — N183 Chronic kidney disease, stage 3 unspecified: Secondary | ICD-10-CM | POA: Diagnosis not present

## 2020-01-05 DIAGNOSIS — N390 Urinary tract infection, site not specified: Secondary | ICD-10-CM | POA: Diagnosis not present

## 2020-01-11 DIAGNOSIS — Z7901 Long term (current) use of anticoagulants: Secondary | ICD-10-CM | POA: Diagnosis not present

## 2020-01-11 DIAGNOSIS — I480 Paroxysmal atrial fibrillation: Secondary | ICD-10-CM | POA: Diagnosis not present

## 2020-01-11 DIAGNOSIS — R519 Headache, unspecified: Secondary | ICD-10-CM | POA: Diagnosis not present

## 2020-01-11 DIAGNOSIS — K644 Residual hemorrhoidal skin tags: Secondary | ICD-10-CM | POA: Diagnosis not present

## 2020-01-11 DIAGNOSIS — Z1152 Encounter for screening for COVID-19: Secondary | ICD-10-CM | POA: Diagnosis not present

## 2020-01-11 DIAGNOSIS — R109 Unspecified abdominal pain: Secondary | ICD-10-CM | POA: Diagnosis not present

## 2020-01-12 DIAGNOSIS — K521 Toxic gastroenteritis and colitis: Secondary | ICD-10-CM | POA: Diagnosis not present

## 2020-01-12 DIAGNOSIS — Z7901 Long term (current) use of anticoagulants: Secondary | ICD-10-CM | POA: Diagnosis not present

## 2020-01-12 DIAGNOSIS — K625 Hemorrhage of anus and rectum: Secondary | ICD-10-CM | POA: Diagnosis not present

## 2020-01-12 DIAGNOSIS — K602 Anal fissure, unspecified: Secondary | ICD-10-CM | POA: Diagnosis not present

## 2020-01-17 ENCOUNTER — Encounter (INDEPENDENT_AMBULATORY_CARE_PROVIDER_SITE_OTHER): Payer: Medicare Other | Admitting: Ophthalmology

## 2020-02-08 DIAGNOSIS — Z7901 Long term (current) use of anticoagulants: Secondary | ICD-10-CM | POA: Diagnosis not present

## 2020-02-08 DIAGNOSIS — I480 Paroxysmal atrial fibrillation: Secondary | ICD-10-CM | POA: Diagnosis not present

## 2020-02-16 ENCOUNTER — Other Ambulatory Visit: Payer: Self-pay | Admitting: Cardiology

## 2020-02-29 NOTE — Progress Notes (Signed)
[facility] Note  03/07/2020     CHIEF COMPLAINT Patient presents for Retina Follow Up   HISTORY OF PRESENT ILLNESS: Gloria [name] is a 87 y.o. female who presents to the clinic today for:   HPI    Retina Follow Up    Patient presents with  Dry AMD.  In both eyes.  This started 5 months ago.  I, the attending physician,  performed the HPI with the patient and updated documentation appropriately.          Comments    Patient here for 5 months retina follow up for non exu ARMD OU. Patient states vision sometimes good and sometimes bad. No eye pain.       Last edited by Bernarda Caffey, MD on 03/07/2020 12:42 PM. (History)    pt states no change in vision, she is still taking AREDS and monitoring her [facility] on an amsler grid  Referring physician: Shon Hough, MD [place],  [place] [id number]  HISTORICAL INFORMATION:   Selected notes from the MEDICAL RECORD NUMBER Referred by Dr. Kathrin Penner LEE: 06-28-19 Ocular Hx- ARMD OU   CURRENT MEDICATIONS: Current Outpatient Medications (Ophthalmic Drugs)  Medication Sig  . Artificial Tear Ointment (DRY EYES OP) Place 1 drop into both eyes daily at 2 PM.   No current facility-administered medications for this visit. (Ophthalmic Drugs)   Current Outpatient Medications (Other)  Medication Sig  . hydrochlorothiazide (HYDRODIURIL) 25 MG tablet Take 1 tablet by mouth once daily  . losartan (COZAAR) 25 MG tablet Take 1 tablet by mouth once daily  . acetaminophen (TYLENOL) 500 MG tablet Take 500 mg by mouth every 6 (six) hours as needed for mild pain or headache.   Marland Kitchen atorvastatin (LIPITOR) 10 MG tablet Take 10 mg by mouth at bedtime.  . ergocalciferol (VITAMIN D2) 1.25 MG (50000 UT) capsule Take 50,000 Units by mouth every Friday.  . famotidine (PEPCID) 20 MG tablet Take daily as needed  . flecainide (TAMBOCOR) 50 MG tablet Take 1 tablet (50 mg total) by mouth 2 (two) times daily.  . Multiple  Vitamins-Minerals (PRESERVISION AREDS PO) Take 1 capsule by mouth 2 (two) times daily.  . nitroGLYCERIN (NITROSTAT) 0.4 MG SL tablet Place 1 tablet (0.4 mg total) under the tongue every 5 (five) minutes as needed for chest pain.  Marland Kitchen omeprazole (PRILOSEC) 20 MG capsule Take 20 mg by mouth 2 (two) times daily.   . polyethylene glycol (MIRALAX / GLYCOLAX) packet Take 17 g by mouth daily as needed for moderate constipation.  . vitamin B-12 (CYANOCOBALAMIN) 1000 MCG tablet Take 1,000 mcg by mouth every evening.   . warfarin (COUMADIN) 6 MG tablet Take 3-6 tablets by mouth See admin instructions. Take 6 mg by mouth daily on Monday, Wednesday  Take 3 mg by mouth daily on all other days.   No current facility-administered medications for this visit. (Other)      REVIEW OF SYSTEMS: ROS    Positive for: Cardiovascular, Eyes   Negative for: Constitutional, Gastrointestinal, Neurological, Skin, Genitourinary, Musculoskeletal, HENT, Endocrine, Respiratory, Psychiatric, Allergic/Imm, Heme/Lymph   Last edited by Theodore Demark, COA on 03/07/2020 10:10 AM. (History)       ALLERGIES Allergies  Allergen Reactions  . Amlodipine Swelling  . Cephalexin Other (See Comments)    unknown  . Codeine Other (See Comments)    unknown  . Metoclopramide Hcl Other (See Comments)    unknown    PAST MEDICAL  HISTORY Past Medical History:  Diagnosis Date  . Arthritis    right  . Atrial fibrillation ([place])   . GERD (gastroesophageal reflux disease)    hx of  . Hepatitis C   . Humerus fracture   . Hyperlipidemia   . Hypertension   . Irritable bowel syndrome   . Meningioma ([place])   . PONV (postoperative nausea and vomiting)    Past Surgical History:  Procedure Laterality Date  . BREAST SURGERY     lumpectomy  . CARDIOVASCULAR STRESS TEST  11/14/2006   EF 67%  . COLONOSCOPY W/ BIOPSIES AND POLYPECTOMY    . ORIF HUMERUS FRACTURE Right 04/18/2014   Procedure: OPEN REDUCTION INTERNAL FIXATION (ORIF) RIGHT  PROXIMAL HUMERUS FRACTURE;  Surgeon: Netta Cedars, MD;  Location: [facility];  Service: Orthopedics;  Laterality: Right;  . ORIF PERIPROSTHETIC FRACTURE Left 04/30/2013   Procedure: OPEN REDUCTION INTERNAL FIXATION (ORIF) PERIPROSTHETIC FRACTURE;  Surgeon: [name], MD;  Location: [facility];  Service: Orthopedics;  Laterality: Left;  . PAROTID GLAND TUMOR EXCISION    . TOTAL HIP ARTHROPLASTY  11/07   left  . TOTAL KNEE ARTHROPLASTY    . TOTAL KNEE ARTHROPLASTY Left 11/24/2017   Procedure: TOTAL KNEE ARTHROPLASTY;  Surgeon: Gaynelle Arabian, MD;  Location: [facility];  Service: Orthopedics;  Laterality: Left;  62min  . TRANSTHORACIC ECHOCARDIOGRAM  10/25/2009   EF 55-60%    FAMILY HISTORY Family History  Problem Relation Age of Onset  . Heart disease Brother   . Breast cancer Neg Hx     SOCIAL HISTORY Social History   Tobacco Use  . Smoking status: Former Smoker    Packs/day: 0.30    Years: 15.00    Pack years: 4.50    Types: Cigarettes    Quit date: 06/07/1980    Years since quitting: 39.7  . Smokeless tobacco: Never Used  Vaping Use  . Vaping Use: Never used  Substance Use Topics  . Alcohol use: No  . Drug use: No         OPHTHALMIC EXAM:  Base Eye Exam    Visual Acuity (Snellen - Linear)      Right Left   Dist cc 20/60 -2 20/70 +2   Dist ph cc NI 20/50 -2   Correction: Glasses       Tonometry (Tonopen, 10:07 AM)      Right Left   Pressure 13 12       Pupils      Dark Light Shape React APD   Right 4 3 Round Brisk None   Left 4 3 Round Brisk None       Visual Fields (Counting fingers)      Left Right    Full Full       Extraocular Movement      Right Left    Full Full       Neuro/Psych    Oriented x3: Yes   Mood/Affect: Normal       Dilation    Both eyes: 1.0% Mydriacyl, 2.5% Phenylephrine @ 10:07 AM        Slit Lamp and Fundus Exam    Slit Lamp Exam      Right Left   Lids/Lashes Dermatochalasis - upper lid, Meibomian gland dysfunction, flesh  colored cyst temporal LL margin Dermatochalasis - upper lid, Meibomian gland dysfunction   Conjunctiva/Sclera white, quiet white, quiet   Cornea +flecks; mild haze, well healed cataract wounds, 3-4+ Punctate epithelial erosions, irregular epi, arcus +  flecks; well healed cataract wounds, 3+ Punctate epithelial erosions, tear film debris, irregular epi, arcus   Anterior Chamber deep; clear deep; clear   Iris round dilated round dilated   Lens PCIOL; open PC PCIOL; open PC   Vitreous syneresis syneresis       Fundus Exam      Right Left   Disc mild pallor; sharp rim mild pallor; sharp rim   C/D Ratio 0.2 0.1   Macula flat; drusen; central PEDs; blunted foveal reflex; RPE mottling and clumping flat; blunted foveal reflex; drusen; RPE mottling and clumping, mild PEDs   Vessels mild attenuation, mild tortuousity attenuated; tortuous   Periphery attached; reticular degen; mild peripheral drusen attached; reticular degen; mild peripheral drusen        Refraction    Wearing Rx      Sphere Cylinder Axis Add   Right -0.50 +1.25 010 +3.00   Left -0.75 +2.25 006 +3.00   Type: prog          IMAGING AND PROCEDURES  Imaging and Procedures for 03/07/2020  OCT, Retina - OU - Both Eyes       Right Eye Quality was good. Central Foveal Thickness: 312. Progression has been stable. Findings include normal foveal contour, no IRF, no SRF, retinal drusen , pigment epithelial detachment, outer retinal atrophy.   Left Eye Quality was good. Central Foveal Thickness: 299. Progression has been stable. Findings include normal foveal contour, no IRF, no SRF, pigment epithelial detachment, retinal drusen .   Notes *Images captured and stored on drive  Diagnosis / Impression:  Non-exu ARMD OU  Central PEDs OU (OD>OS)  Clinical management:  See below  Abbreviations: NFP - Normal foveal profile. CME - cystoid macular edema. PED - pigment epithelial detachment. IRF - intraretinal fluid. SRF - subretinal  fluid. EZ - ellipsoid zone. ERM - epiretinal membrane. ORA - outer retinal atrophy. ORT - outer retinal tubulation. SRHM - subretinal hyper-reflective material. IRHM - intraretinal hyper-reflective material                 ASSESSMENT/PLAN:    ICD-10-CM   1. Intermediate stage nonexudative age-related macular degeneration of both eyes  H35.3132   2. Retinal edema  H35.81 OCT, Retina - OU - Both Eyes  3. Fleck corneal dystrophy  H18.599   4. Essential hypertension  I10   5. Hypertensive retinopathy of both eyes  H35.033   6. Pseudophakia of both eyes  Z96.1     1,2. Age related macular degeneration, non-exudative, OU -- stable  - Intermediate stage with prominent central PEDs OU (OD > OS)  - BCVA stable at 20/60 OD, 20/50 OS  - The incidence, anatomy, and pathology of dry AMD, risk of progression, and the AREDS and AREDS 2 studies including smoking risks discussed with patient.  - cont AREDS 2 supplementation and amsler grid monitoring  - f/u 6 mos -- DFE/OCT  3. Fleck corneal dystrophy OU  - under the expert management of Dr. Kathrin Penner  4,5. Hypertensive retinopathy OU - discussed importance of tight BP control - monitor  6. Pseudophakia OU  - s/p CE/IOL OU  - IOLs in good position, doing well  - monitor   Ophthalmic Meds Ordered this visit:  No orders of the defined types were placed in this encounter.      Return in about 6 months (around 09/07/2020) for f/u non-exu ARMD OU, DFE, OCT.  There are no Patient Instructions on file for this visit.   Explained  the diagnoses, plan, and follow up with the patient and they expressed understanding.  Patient expressed understanding of the importance of proper follow up care.    This document serves as a record of services personally performed by Gardiner Sleeper, MD, PhD. It was created on their behalf by Estill Bakes, COT an ophthalmic technician. The creation of this record is the provider's dictation and/or activities  during the visit.    Electronically signed by: Estill Bakes, COT 2.22.22 @ 12:59 PM   This document serves as a record of services personally performed by Gardiner Sleeper, MD, PhD. It was created on their behalf by San Jetty. Owens Shark, OA an ophthalmic technician. The creation of this record is the provider's dictation and/or activities during the visit.    Electronically signed by: San Jetty. Owens Shark, [place] 03.01.2022 12:59 PM  Gardiner Sleeper, M.D., Ph.D. Diseases & Surgery of the Retina and [place] 03/07/2020   I have reviewed the above documentation for accuracy and completeness, and I agree with the above. Gardiner Sleeper, M.D., Ph.D. 03/07/20 12:59 PM   Abbreviations: M myopia (nearsighted); A astigmatism; H hyperopia (farsighted); P presbyopia; Mrx spectacle prescription;  CTL contact lenses; OD right eye; OS left eye; OU both eyes  XT exotropia; ET esotropia; PEK punctate epithelial keratitis; PEE punctate epithelial erosions; DES dry eye syndrome; MGD meibomian gland dysfunction; ATs artificial tears; PFAT's preservative free artificial tears; [place] nuclear sclerotic cataract; PSC posterior subcapsular cataract; ERM epi-retinal membrane; PVD posterior vitreous detachment; RD retinal detachment; DM diabetes mellitus; DR diabetic retinopathy; NPDR non-proliferative diabetic retinopathy; PDR proliferative diabetic retinopathy; CSME clinically significant macular edema; DME diabetic macular edema; dbh dot blot hemorrhages; CWS cotton wool spot; POAG primary open angle glaucoma; C/D cup-to-disc ratio; HVF humphrey visual field; GVF goldmann visual field; OCT optical coherence tomography; IOP intraocular pressure; BRVO Branch retinal vein occlusion; CRVO central retinal vein occlusion; CRAO central retinal artery occlusion; BRAO branch retinal artery occlusion; RT retinal tear; SB scleral buckle; PPV pars plana vitrectomy; VH Vitreous hemorrhage; PRP panretinal laser  photocoagulation; IVK intravitreal kenalog; VMT vitreomacular traction; MH Macular hole;  NVD neovascularization of the disc; NVE neovascularization elsewhere; AREDS age related eye disease study; ARMD age related macular degeneration; POAG primary open angle glaucoma; EBMD epithelial/anterior basement membrane dystrophy; ACIOL anterior chamber intraocular lens; IOL intraocular lens; PCIOL posterior chamber intraocular lens; Phaco/IOL phacoemulsification with intraocular lens placement; [place] photorefractive keratectomy; LASIK laser assisted in situ keratomileusis; HTN hypertension; DM diabetes mellitus; COPD chronic obstructive pulmonary disease

## 2020-03-07 ENCOUNTER — Ambulatory Visit (INDEPENDENT_AMBULATORY_CARE_PROVIDER_SITE_OTHER): Payer: Medicare Other | Admitting: Ophthalmology

## 2020-03-07 ENCOUNTER — Encounter (INDEPENDENT_AMBULATORY_CARE_PROVIDER_SITE_OTHER): Payer: Self-pay | Admitting: Ophthalmology

## 2020-03-07 ENCOUNTER — Other Ambulatory Visit: Payer: Self-pay

## 2020-03-07 DIAGNOSIS — H3581 Retinal edema: Secondary | ICD-10-CM | POA: Diagnosis not present

## 2020-03-07 DIAGNOSIS — H353132 Nonexudative age-related macular degeneration, bilateral, intermediate dry stage: Secondary | ICD-10-CM

## 2020-03-07 DIAGNOSIS — Z961 Presence of intraocular lens: Secondary | ICD-10-CM | POA: Diagnosis not present

## 2020-03-07 DIAGNOSIS — H18599 Other hereditary corneal dystrophies, unspecified eye: Secondary | ICD-10-CM

## 2020-03-07 DIAGNOSIS — H35033 Hypertensive retinopathy, bilateral: Secondary | ICD-10-CM | POA: Diagnosis not present

## 2020-03-07 DIAGNOSIS — I10 Essential (primary) hypertension: Secondary | ICD-10-CM

## 2020-03-13 DIAGNOSIS — I480 Paroxysmal atrial fibrillation: Secondary | ICD-10-CM | POA: Diagnosis not present

## 2020-03-13 DIAGNOSIS — Z7901 Long term (current) use of anticoagulants: Secondary | ICD-10-CM | POA: Diagnosis not present

## 2020-03-31 NOTE — Progress Notes (Signed)
Gloria [name] Date of Birth: 11-Aug-1930   History of Present Illness: Gloria [name] seen today for followup paroxysmal afib. She has a history of atrial fibrillation controlled with flecainide. She is on chronic anticoagulation with coumadin. She tried Xarelto in the past but this upset her stomach.   She was admitted in May 2018 with symptoms of chest pressure and SOB. She ruled out for MI. Echo showed moderate LVH and LAE with normal systolic function. Myoview study was normal. She was placed on amlodipine for HTN. Subsequently she developed lower extremity swelling with a rash and red blotches. Amlodipine was stopped and this resolved.   She did have dental Xray in June 2020 which showed some calcification in her carotid artery. Dopplers in Sept 2020 showed less than 39% stenosis.  On follow up today she is seen with her daughter. She is generally doing well. She reports BP at home has been up and down but generally OK.  She does feel her heart pound when she lies on her left side. She notes some discomfort when her heart is not beating like it should. This typically occurs once a week and lasts a few minutes. She does worry a lot and has anxiety.  She reports INRs have been therapeutic. No bleeding. No Neurologic symptoms.  Current Outpatient Medications on File Prior to Visit  Medication Sig Dispense Refill  . acetaminophen (TYLENOL) 500 MG tablet Take 500 mg by mouth every 6 (six) hours as needed for mild pain or headache.     . Artificial Tear Ointment (DRY EYES OP) Place 1 drop into both eyes daily at 2 PM.    . atorvastatin (LIPITOR) 10 MG tablet Take 10 mg by mouth at bedtime.    . calcium carbonate (OSCAL) 1500 (600 Ca) MG TABS tablet Take 600 mg by mouth 2 (two) times daily.    . ergocalciferol (VITAMIN D2) 1.25 MG (50000 UT) capsule Take 50,000 Units by mouth every Friday.    . famotidine (PEPCID) 20 MG tablet Take daily as needed    . flecainide (TAMBOCOR) 50 MG tablet Take 1  tablet (50 mg total) by mouth 2 (two) times daily. 180 tablet 1  . gabapentin (NEURONTIN) 100 MG capsule Take 100 mg by mouth as needed.    . hydrochlorothiazide (HYDRODIURIL) 25 MG tablet Take 1 tablet by mouth once daily 90 tablet 0  . hydrocortisone (ANUSOL-HC) 2.5 % rectal cream 1 application    . losartan (COZAAR) 25 MG tablet Take 1 tablet by mouth once daily 90 tablet 0  . Multiple Vitamins-Minerals (PRESERVISION AREDS PO) Take 1 capsule by mouth 2 (two) times daily.    . nitroGLYCERIN (NITROSTAT) 0.4 MG SL tablet Place 1 tablet (0.4 mg total) under the tongue every 5 (five) minutes as needed for chest pain. 30 tablet 0  . omeprazole (PRILOSEC) 40 MG capsule Take 40 mg by mouth 2 (two) times daily.    . ondansetron (ZOFRAN) 4 MG tablet Take 4 mg by mouth every 6 (six) hours as needed.    Vladimir Faster Glycol-Propyl Glycol (SYSTANE) 0.4-0.3 % SOLN Place 1 drop into both eyes daily as needed.    . polyethylene glycol (MIRALAX / GLYCOLAX) packet Take 17 g by mouth daily as needed for moderate constipation.    Marland Kitchen PROCTO-MED HC 2.5 % rectal cream Apply topically daily.    . vitamin B-12 (CYANOCOBALAMIN) 1000 MCG tablet Take 1,000 mcg by mouth every evening.     . warfarin (COUMADIN) 6  MG tablet Take 3-6 tablets by mouth See admin instructions. Take 6 mg by mouth daily on Monday, Wednesday  Take 3 mg by mouth daily on all other days.    Marland Kitchen zolpidem (AMBIEN) 5 MG tablet Take 5 mg by mouth at bedtime as needed.     No current facility-administered medications on file prior to visit.    Allergies  Allergen Reactions  . Amlodipine Swelling  . Cephalexin Other (See Comments)    unknown  . Codeine Other (See Comments)    unknown  . Metoclopramide Hcl Other (See Comments)    unknown    Past Medical History:  Diagnosis Date  . Arthritis    right  . Atrial fibrillation ([place])   . GERD (gastroesophageal reflux disease)    hx of  . Hepatitis C   . Humerus fracture   . Hyperlipidemia   .  Hypertension   . Irritable bowel syndrome   . Meningioma ([place])   . PONV (postoperative nausea and vomiting)     Past Surgical History:  Procedure Laterality Date  . BREAST SURGERY     lumpectomy  . CARDIOVASCULAR STRESS TEST  11/14/2006   EF 67%  . COLONOSCOPY W/ BIOPSIES AND POLYPECTOMY    . ORIF HUMERUS FRACTURE Right 04/18/2014   Procedure: OPEN REDUCTION INTERNAL FIXATION (ORIF) RIGHT PROXIMAL HUMERUS FRACTURE;  Surgeon: Netta Cedars, MD;  Location: [facility];  Service: Orthopedics;  Laterality: Right;  . ORIF PERIPROSTHETIC FRACTURE Left 04/30/2013   Procedure: OPEN REDUCTION INTERNAL FIXATION (ORIF) PERIPROSTHETIC FRACTURE;  Surgeon: [name], MD;  Location: [facility];  Service: Orthopedics;  Laterality: Left;  . PAROTID GLAND TUMOR EXCISION    . TOTAL HIP ARTHROPLASTY  11/07   left  . TOTAL KNEE ARTHROPLASTY    . TOTAL KNEE ARTHROPLASTY Left 11/24/2017   Procedure: TOTAL KNEE ARTHROPLASTY;  Surgeon: Gaynelle Arabian, MD;  [facility];  Service: Orthopedics;  Laterality: Left;  32min  . TRANSTHORACIC ECHOCARDIOGRAM  10/25/2009   EF 55-60%    Social History   Tobacco Use  Smoking Status Former Smoker  . Packs/day: 0.30  . Years: 15.00  . Pack years: 4.50  . Types: Cigarettes  . Quit date: 06/07/1980  . Years since quitting: 39.8  Smokeless Tobacco Never Used    Social History   Substance and Sexual Activity  Alcohol Use No    Family History  Problem Relation Age of Onset  . Heart disease Brother   . Breast cancer Neg Hx     Review of Systems: As noted in history of present illness.  All other systems were reviewed and are negative.  Physical Exam: BP (!) 170/80 (BP Location: Left Arm, Patient Position: Sitting)   Pulse (!) 56   Ht 5\' 5"  (1.651 m)   Wt 146 lb (66.2 kg)   SpO2 96%   BMI 24.30 kg/m  GENERAL:  Well appearing, elderly WF  HEENT:  PERRL, EOMI, sclera are clear. Oropharynx is clear. NECK:  No jugular venous distention, carotid upstroke brisk  and symmetric, bilateral carotid bruits, no thyromegaly or adenopathy LUNGS:  Clear to auscultation bilaterally CHEST:  Unremarkable HEART:  RRR,  PMI not displaced or sustained,S1 and S2 within normal limits, no S3, no S4: no clicks, no rubs, gr 1-6/6 SEM RUSB ABD:  Soft, nontender. BS +, no masses or bruits. No hepatomegaly, no splenomegaly EXT:  2 + pulses throughout, no edema, no cyanosis no clubbing SKIN:  Warm and dry.  No rashes  NEURO:  Alert and oriented x 3. Cranial nerves II through XII intact. PSYCH:  Cognitively intact     LABORATORY DATA: Lab Results  Component Value Date   WBC 6.7 12/12/2017   HGB 13.9 12/12/2017   HCT 41.0 12/12/2017   PLT 417 (H) 12/12/2017   GLUCOSE 102 (H) 12/12/2017   CHOL 178 07/14/2016   TRIG 69 07/14/2016   HDL 58 07/14/2016   LDLCALC 106 (H) 07/14/2016   ALT 16 11/17/2017   AST 22 11/17/2017   NA 136 12/12/2017   K 4.0 12/12/2017   CL 100 12/12/2017   CREATININE 0.70 12/12/2017   BUN 17 12/12/2017   CO2 27 11/26/2017   TSH 2.129 06/05/2016   INR 1.9 (A) 02/10/2018   HGBA1C 5.5 07/14/2016   Labs dated 03/20/16: cholesterol 200, triglycerides 75, LDL 116, HDL 69. CMET is normal. Dated 03/24/18: cholesterol 172, triglycerides 70, HDL 63, LDL 95. CBC and CMET normal Dated 04/13/19: cholesterol 169, triglycerides 76, HDL 62, LDL 92. CBC normal Dated 01/11/20 normal CMET  Ecg today shows NSR rate 56 with occ. PACs. LAD, LVH. No change since Dec 2019. I have personally reviewed and interpreted this study.    Myoview 06/05/16: Study Result     The study is normal.  This is a low risk study.  The left ventricular ejection fraction is hyperdynamic (>65%).  There was no ST segment deviation noted during stress.   Normal resting and stress perfusion. No ischemia or infarction EF 72%    Echo 06/05/16: Study Conclusions  - Left ventricle: The cavity size was normal. Wall thickness was   increased in a pattern of mild LVH. There was  moderate concentric   hypertrophy. Systolic function was normal. The estimated ejection   fraction was in the range of 55% to 60%. - Left atrium: The atrium was moderately dilated. - Atrial septum: No defect or patent foramen ovale was identified.  Assessment / Plan: 1. Atrial fibrillation- paroxysmal. This is well controlled on low dose flecainide. She is in NSR today.We will continue her current therapy. On coumadin. INR followed by PCP.  2. Chronic anticoagulation, therapeutic.   3. Hypertension, BP elevated today but has been well controlled at home.  Continue losartan and HCT and monitor.   4. Carotid plaque with bilateral bruits. No significant obstruction    I will follow up in 6 months

## 2020-04-03 ENCOUNTER — Other Ambulatory Visit: Payer: Self-pay

## 2020-04-03 ENCOUNTER — Encounter: Payer: Self-pay | Admitting: Cardiology

## 2020-04-03 ENCOUNTER — Ambulatory Visit (INDEPENDENT_AMBULATORY_CARE_PROVIDER_SITE_OTHER): Payer: Medicare Other | Admitting: Cardiology

## 2020-04-03 VITALS — BP 170/80 | HR 56 | Ht 65.0 in | Wt 146.0 lb

## 2020-04-03 DIAGNOSIS — I480 Paroxysmal atrial fibrillation: Secondary | ICD-10-CM

## 2020-04-03 DIAGNOSIS — Z7901 Long term (current) use of anticoagulants: Secondary | ICD-10-CM

## 2020-04-03 DIAGNOSIS — I10 Essential (primary) hypertension: Secondary | ICD-10-CM | POA: Diagnosis not present

## 2020-04-03 DIAGNOSIS — E7800 Pure hypercholesterolemia, unspecified: Secondary | ICD-10-CM

## 2020-04-10 DIAGNOSIS — H6123 Impacted cerumen, bilateral: Secondary | ICD-10-CM | POA: Diagnosis not present

## 2020-04-18 DIAGNOSIS — E785 Hyperlipidemia, unspecified: Secondary | ICD-10-CM | POA: Diagnosis not present

## 2020-04-18 DIAGNOSIS — M859 Disorder of bone density and structure, unspecified: Secondary | ICD-10-CM | POA: Diagnosis not present

## 2020-04-25 DIAGNOSIS — I10 Essential (primary) hypertension: Secondary | ICD-10-CM | POA: Diagnosis not present

## 2020-04-25 DIAGNOSIS — G479 Sleep disorder, unspecified: Secondary | ICD-10-CM | POA: Diagnosis not present

## 2020-04-25 DIAGNOSIS — K219 Gastro-esophageal reflux disease without esophagitis: Secondary | ICD-10-CM | POA: Diagnosis not present

## 2020-04-25 DIAGNOSIS — E785 Hyperlipidemia, unspecified: Secondary | ICD-10-CM | POA: Diagnosis not present

## 2020-04-25 DIAGNOSIS — Z0000 Encounter for general adult medical examination without abnormal findings: Secondary | ICD-10-CM | POA: Diagnosis not present

## 2020-04-25 DIAGNOSIS — R2681 Unsteadiness on feet: Secondary | ICD-10-CM | POA: Diagnosis not present

## 2020-04-25 DIAGNOSIS — I1310 Hypertensive heart and chronic kidney disease without heart failure, with stage 1 through stage 4 chronic kidney disease, or unspecified chronic kidney disease: Secondary | ICD-10-CM | POA: Diagnosis not present

## 2020-04-25 DIAGNOSIS — R82998 Other abnormal findings in urine: Secondary | ICD-10-CM | POA: Diagnosis not present

## 2020-04-25 DIAGNOSIS — K3184 Gastroparesis: Secondary | ICD-10-CM | POA: Diagnosis not present

## 2020-04-25 DIAGNOSIS — Z7901 Long term (current) use of anticoagulants: Secondary | ICD-10-CM | POA: Diagnosis not present

## 2020-05-10 DIAGNOSIS — H353132 Nonexudative age-related macular degeneration, bilateral, intermediate dry stage: Secondary | ICD-10-CM | POA: Diagnosis not present

## 2020-05-10 DIAGNOSIS — H524 Presbyopia: Secondary | ICD-10-CM | POA: Diagnosis not present

## 2020-05-10 DIAGNOSIS — H1820 Unspecified corneal edema: Secondary | ICD-10-CM | POA: Diagnosis not present

## 2020-05-10 DIAGNOSIS — H52203 Unspecified astigmatism, bilateral: Secondary | ICD-10-CM | POA: Diagnosis not present

## 2020-05-11 ENCOUNTER — Other Ambulatory Visit: Payer: Self-pay | Admitting: Cardiology

## 2020-06-08 DIAGNOSIS — Z1152 Encounter for screening for COVID-19: Secondary | ICD-10-CM | POA: Diagnosis not present

## 2020-06-08 DIAGNOSIS — I480 Paroxysmal atrial fibrillation: Secondary | ICD-10-CM | POA: Diagnosis not present

## 2020-06-08 DIAGNOSIS — U071 COVID-19: Secondary | ICD-10-CM | POA: Diagnosis not present

## 2020-06-08 DIAGNOSIS — Z7901 Long term (current) use of anticoagulants: Secondary | ICD-10-CM | POA: Diagnosis not present

## 2020-06-08 DIAGNOSIS — R058 Other specified cough: Secondary | ICD-10-CM | POA: Diagnosis not present

## 2020-06-16 ENCOUNTER — Other Ambulatory Visit: Payer: Self-pay

## 2020-06-16 ENCOUNTER — Emergency Department (HOSPITAL_COMMUNITY): Payer: Medicare Other

## 2020-06-16 ENCOUNTER — Encounter (HOSPITAL_COMMUNITY): Payer: Self-pay

## 2020-06-16 ENCOUNTER — Inpatient Hospital Stay (HOSPITAL_COMMUNITY)
Admission: EM | Admit: 2020-06-16 | Discharge: 2020-06-18 | DRG: 149 | Disposition: A | Discharge status: Home Health | Payer: Medicare Other | Attending: Internal Medicine | Admitting: Internal Medicine

## 2020-06-16 ENCOUNTER — Observation Stay (HOSPITAL_COMMUNITY): Payer: Medicare Other

## 2020-06-16 DIAGNOSIS — I6523 Occlusion and stenosis of bilateral carotid arteries: Secondary | ICD-10-CM | POA: Diagnosis not present

## 2020-06-16 DIAGNOSIS — Z96652 Presence of left artificial knee joint: Secondary | ICD-10-CM | POA: Diagnosis present

## 2020-06-16 DIAGNOSIS — Z8673 Personal history of transient ischemic attack (TIA), and cerebral infarction without residual deficits: Secondary | ICD-10-CM

## 2020-06-16 DIAGNOSIS — K219 Gastro-esophageal reflux disease without esophagitis: Secondary | ICD-10-CM | POA: Diagnosis present

## 2020-06-16 DIAGNOSIS — I4891 Unspecified atrial fibrillation: Secondary | ICD-10-CM | POA: Diagnosis not present

## 2020-06-16 DIAGNOSIS — I160 Hypertensive urgency: Secondary | ICD-10-CM | POA: Diagnosis present

## 2020-06-16 DIAGNOSIS — R0602 Shortness of breath: Secondary | ICD-10-CM

## 2020-06-16 DIAGNOSIS — Z20822 Contact with and (suspected) exposure to covid-19: Secondary | ICD-10-CM | POA: Diagnosis present

## 2020-06-16 DIAGNOSIS — B1920 Unspecified viral hepatitis C without hepatic coma: Secondary | ICD-10-CM | POA: Diagnosis not present

## 2020-06-16 DIAGNOSIS — R079 Chest pain, unspecified: Secondary | ICD-10-CM | POA: Diagnosis present

## 2020-06-16 DIAGNOSIS — H8112 Benign paroxysmal vertigo, left ear: Secondary | ICD-10-CM | POA: Diagnosis present

## 2020-06-16 DIAGNOSIS — I639 Cerebral infarction, unspecified: Secondary | ICD-10-CM | POA: Diagnosis not present

## 2020-06-16 DIAGNOSIS — G4489 Other headache syndrome: Secondary | ICD-10-CM | POA: Diagnosis not present

## 2020-06-16 DIAGNOSIS — R001 Bradycardia, unspecified: Secondary | ICD-10-CM | POA: Diagnosis not present

## 2020-06-16 DIAGNOSIS — Z7901 Long term (current) use of anticoagulants: Secondary | ICD-10-CM | POA: Diagnosis not present

## 2020-06-16 DIAGNOSIS — Z87891 Personal history of nicotine dependence: Secondary | ICD-10-CM

## 2020-06-16 DIAGNOSIS — E860 Dehydration: Secondary | ICD-10-CM | POA: Diagnosis not present

## 2020-06-16 DIAGNOSIS — I6782 Cerebral ischemia: Secondary | ICD-10-CM | POA: Diagnosis not present

## 2020-06-16 DIAGNOSIS — R42 Dizziness and giddiness: Secondary | ICD-10-CM | POA: Diagnosis not present

## 2020-06-16 DIAGNOSIS — H8110 Benign paroxysmal vertigo, unspecified ear: Secondary | ICD-10-CM | POA: Diagnosis not present

## 2020-06-16 DIAGNOSIS — Z8616 Personal history of COVID-19: Secondary | ICD-10-CM | POA: Diagnosis present

## 2020-06-16 DIAGNOSIS — I10 Essential (primary) hypertension: Secondary | ICD-10-CM | POA: Diagnosis present

## 2020-06-16 DIAGNOSIS — I480 Paroxysmal atrial fibrillation: Secondary | ICD-10-CM | POA: Diagnosis present

## 2020-06-16 DIAGNOSIS — Z8249 Family history of ischemic heart disease and other diseases of the circulatory system: Secondary | ICD-10-CM

## 2020-06-16 DIAGNOSIS — E876 Hypokalemia: Secondary | ICD-10-CM | POA: Diagnosis present

## 2020-06-16 DIAGNOSIS — Z79899 Other long term (current) drug therapy: Secondary | ICD-10-CM

## 2020-06-16 DIAGNOSIS — Z96642 Presence of left artificial hip joint: Secondary | ICD-10-CM | POA: Diagnosis present

## 2020-06-16 DIAGNOSIS — M1990 Unspecified osteoarthritis, unspecified site: Secondary | ICD-10-CM | POA: Diagnosis present

## 2020-06-16 DIAGNOSIS — Z885 Allergy status to narcotic agent status: Secondary | ICD-10-CM

## 2020-06-16 DIAGNOSIS — G319 Degenerative disease of nervous system, unspecified: Secondary | ICD-10-CM | POA: Diagnosis not present

## 2020-06-16 DIAGNOSIS — E785 Hyperlipidemia, unspecified: Secondary | ICD-10-CM | POA: Diagnosis present

## 2020-06-16 DIAGNOSIS — Z888 Allergy status to other drugs, medicaments and biological substances status: Secondary | ICD-10-CM

## 2020-06-16 LAB — URINALYSIS, ROUTINE W REFLEX MICROSCOPIC
Bacteria, UA: NONE SEEN
Bilirubin Urine: NEGATIVE
Glucose, UA: NEGATIVE mg/dL
Hgb urine dipstick: NEGATIVE
Ketones, ur: 5 mg/dL — AB
Nitrite: NEGATIVE
Protein, ur: NEGATIVE mg/dL
Specific Gravity, Urine: 1.024 (ref 1.005–1.030)
pH: 9 — ABNORMAL HIGH (ref 5.0–8.0)

## 2020-06-16 LAB — CBC WITH DIFFERENTIAL/PLATELET
Abs Immature Granulocytes: 0.02 10*3/uL (ref 0.00–0.07)
Basophils Absolute: 0 10*3/uL (ref 0.0–0.1)
Basophils Relative: 1 %
Eosinophils Absolute: 0.1 10*3/uL (ref 0.0–0.5)
Eosinophils Relative: 1 %
HCT: 41.1 % (ref 36.0–46.0)
Hemoglobin: 13.3 g/dL (ref 12.0–15.0)
Immature Granulocytes: 0 %
Lymphocytes Relative: 18 %
Lymphs Abs: 1.1 10*3/uL (ref 0.7–4.0)
MCH: 29.4 pg (ref 26.0–34.0)
MCHC: 32.4 g/dL (ref 30.0–36.0)
MCV: 90.9 fL (ref 80.0–100.0)
Monocytes Absolute: 0.6 10*3/uL (ref 0.1–1.0)
Monocytes Relative: 9 %
Neutro Abs: 4.7 10*3/uL (ref 1.7–7.7)
Neutrophils Relative %: 71 %
Platelets: 223 10*3/uL (ref 150–400)
RBC: 4.52 MIL/uL (ref 3.87–5.11)
RDW: 13.7 % (ref 11.5–15.5)
WBC: 6.5 10*3/uL (ref 4.0–10.5)
nRBC: 0 % (ref 0.0–0.2)

## 2020-06-16 LAB — SARS CORONAVIRUS 2 (TAT 6-24 HRS): SARS Coronavirus 2: NEGATIVE

## 2020-06-16 LAB — PROTIME-INR
INR: 3 — ABNORMAL HIGH (ref 0.8–1.2)
Prothrombin Time: 30.7 seconds — ABNORMAL HIGH (ref 11.4–15.2)

## 2020-06-16 LAB — COMPREHENSIVE METABOLIC PANEL
ALT: 14 U/L (ref 0–44)
AST: 22 U/L (ref 15–41)
Albumin: 3 g/dL — ABNORMAL LOW (ref 3.5–5.0)
Alkaline Phosphatase: 87 U/L (ref 38–126)
Anion gap: 8 (ref 5–15)
BUN: 13 mg/dL (ref 8–23)
CO2: 22 mmol/L (ref 22–32)
Calcium: 7.9 mg/dL — ABNORMAL LOW (ref 8.9–10.3)
Chloride: 108 mmol/L (ref 98–111)
Creatinine, Ser: 0.61 mg/dL (ref 0.44–1.00)
GFR, Estimated: >60 mL/min (ref >60)
Glucose, Bld: 82 mg/dL (ref 70–99)
Potassium: 3.4 mmol/L — ABNORMAL LOW (ref 3.5–5.1)
Sodium: 138 mmol/L (ref 135–145)
Total Bilirubin: 1 mg/dL (ref 0.3–1.2)
Total Protein: 5 g/dL — ABNORMAL LOW (ref 6.5–8.1)

## 2020-06-16 LAB — MAGNESIUM: Magnesium: 2.1 mg/dL (ref 1.7–2.4)

## 2020-06-16 LAB — BRAIN NATRIURETIC PEPTIDE: B Natriuretic Peptide: 192.4 pg/mL — ABNORMAL HIGH (ref 0.0–100.0)

## 2020-06-16 LAB — LIPASE, BLOOD: Lipase: 37 U/L (ref 11–51)

## 2020-06-16 LAB — TROPONIN I (HIGH SENSITIVITY): Troponin I (High Sensitivity): 11 ng/L (ref <18)

## 2020-06-16 MED ORDER — ZOLPIDEM TARTRATE 5 MG PO TABS: 5.0000 mg | ORAL_TABLET | Freq: Every evening | ORAL | Status: DC | PRN

## 2020-06-16 MED ORDER — CALCIUM GLUCONATE-NACL 1-0.675 GM/50ML-% IV SOLN
1.0000 g | Freq: Once | INTRAVENOUS | Status: AC
  Administered 2020-06-16: 1000 mg via INTRAVENOUS
  Filled 2020-06-16: qty 50

## 2020-06-16 MED ORDER — FENTANYL CITRATE (PF) 100 MCG/2ML IJ SOLN
25.0000 ug | INTRAMUSCULAR | Status: DC | PRN
  Administered 2020-06-16: 25 ug via INTRAVENOUS
  Filled 2020-06-16: qty 2

## 2020-06-16 MED ORDER — HYDRALAZINE HCL 20 MG/ML IJ SOLN
10.0000 mg | Freq: Once | INTRAMUSCULAR | Status: AC
  Administered 2020-06-16: 10 mg via INTRAVENOUS
  Filled 2020-06-16: qty 1

## 2020-06-16 MED ORDER — POTASSIUM CHLORIDE 10 MEQ/100ML IV SOLN
10.0000 meq | Freq: Once | INTRAVENOUS | Status: AC
  Administered 2020-06-16: 10 meq via INTRAVENOUS
  Filled 2020-06-16: qty 100

## 2020-06-16 MED ORDER — LORAZEPAM 2 MG/ML IJ SOLN
0.5000 mg | INTRAMUSCULAR | Status: DC | PRN
  Administered 2020-06-16: 0.5 mg via INTRAVENOUS
  Filled 2020-06-16: qty 1

## 2020-06-16 MED ORDER — SODIUM CHLORIDE 0.9% FLUSH
3.0000 mL | Freq: Two times a day (BID) | INTRAVENOUS | Status: DC
  Administered 2020-06-16 – 2020-06-18 (×3): 3 mL via INTRAVENOUS

## 2020-06-16 MED ORDER — WARFARIN SODIUM 3 MG PO TABS
3.0000 mg | ORAL_TABLET | Freq: Once | ORAL | Status: AC
  Administered 2020-06-16: 3 mg via ORAL
  Filled 2020-06-16 (×2): qty 1

## 2020-06-16 MED ORDER — POLYETHYL GLYCOL-PROPYL GLYCOL 0.4-0.3 % OP SOLN: 1.0000 [drp] | Freq: Every day | OPHTHALMIC | Status: DC | PRN

## 2020-06-16 MED ORDER — WARFARIN - PHARMACIST DOSING INPATIENT: Freq: Every day | Status: DC

## 2020-06-16 MED ORDER — HYDRALAZINE HCL 20 MG/ML IJ SOLN: 10.0000 mg | INTRAMUSCULAR | Status: DC | PRN

## 2020-06-16 MED ORDER — ACETAMINOPHEN 325 MG PO TABS: 650.0000 mg | ORAL_TABLET | Freq: Four times a day (QID) | ORAL | Status: DC | PRN

## 2020-06-16 MED ORDER — PANTOPRAZOLE SODIUM 40 MG PO TBEC: 40.0000 mg | DELAYED_RELEASE_TABLET | Freq: Every day | ORAL | Status: DC

## 2020-06-16 MED ORDER — ONDANSETRON HCL 4 MG PO TABS: 4.0000 mg | ORAL_TABLET | Freq: Four times a day (QID) | ORAL | Status: DC | PRN

## 2020-06-16 MED ORDER — LOSARTAN POTASSIUM 25 MG PO TABS
25.0000 mg | ORAL_TABLET | Freq: Every day | ORAL | Status: DC
  Administered 2020-06-16 – 2020-06-17 (×2): 25 mg via ORAL
  Filled 2020-06-16 (×2): qty 1

## 2020-06-16 MED ORDER — POTASSIUM CHLORIDE CRYS ER 20 MEQ PO TBCR
40.0000 meq | EXTENDED_RELEASE_TABLET | ORAL | Status: AC
  Administered 2020-06-16: 40 meq via ORAL
  Filled 2020-06-16: qty 2

## 2020-06-16 MED ORDER — ATORVASTATIN CALCIUM 10 MG PO TABS
10.0000 mg | ORAL_TABLET | Freq: Every day | ORAL | Status: DC
  Administered 2020-06-16 – 2020-06-17 (×2): 10 mg via ORAL
  Filled 2020-06-16 (×2): qty 1

## 2020-06-16 MED ORDER — POLYVINYL ALCOHOL 1.4 % OP SOLN
1.0000 [drp] | OPHTHALMIC | Status: DC | PRN
  Filled 2020-06-16: qty 15

## 2020-06-16 MED ORDER — IOHEXOL 350 MG/ML SOLN
75.0000 mL | Freq: Once | INTRAVENOUS | Status: AC | PRN
  Administered 2020-06-16: 75 mL via INTRAVENOUS

## 2020-06-16 MED ORDER — ACETAMINOPHEN 650 MG RE SUPP: 650.0000 mg | Freq: Four times a day (QID) | RECTAL | Status: DC | PRN

## 2020-06-16 MED ORDER — GABAPENTIN 100 MG PO CAPS: 100.0000 mg | ORAL_CAPSULE | Freq: Every day | ORAL | Status: DC | PRN

## 2020-06-16 MED ORDER — PANTOPRAZOLE SODIUM 40 MG IV SOLR
40.0000 mg | Freq: Two times a day (BID) | INTRAVENOUS | Status: DC
  Administered 2020-06-16 – 2020-06-18 (×4): 40 mg via INTRAVENOUS
  Filled 2020-06-16 (×4): qty 40

## 2020-06-16 MED ORDER — ONDANSETRON HCL 4 MG/2ML IJ SOLN: 4.0000 mg | Freq: Four times a day (QID) | INTRAMUSCULAR | Status: DC | PRN

## 2020-06-16 MED ORDER — LORAZEPAM 2 MG/ML IJ SOLN
0.5000 mg | Freq: Once | INTRAMUSCULAR | Status: AC | PRN
  Administered 2020-06-16: 0.5 mg via INTRAVENOUS
  Filled 2020-06-16: qty 1

## 2020-06-16 NOTE — ED Notes (Signed)
Dr.Smith notified of pt pain to mid chest/epigastric area. Reports 2/10 pain scale. Ativan PRN given as well. Will continue to monitor.

## 2020-06-16 NOTE — ED Triage Notes (Signed)
Woke up this AM with dizziness, headache and abdominal pain. BP was 206/78 with EMS. Also reports increased urinary urgency. Alert and oriented x 4.

## 2020-06-16 NOTE — ED Notes (Signed)
MD notified of pt bp in the 856D systolic. Pt remains alert and oriented x 4. Continues to reports headache of 4/10 pain scale. Alert and oriented x 4.

## 2020-06-16 NOTE — ED Notes (Signed)
CT notified of normal creatinine level. Pt is on the list to go to CT.

## 2020-06-16 NOTE — ED Notes (Signed)
Pt called RN regarding pain at 4/10 mid chest to abdominal pain. Pt refused pain meds. Offered to tell MD to get pain meds. Pt states she will notify RN if pain gets worse. Will continue to monitor.

## 2020-06-16 NOTE — Progress Notes (Signed)
ANTICOAGULATION CONSULT NOTE - Initial Consult  Pharmacy Consult for warfarin Indication: atrial fibrillation  Allergies  Allergen Reactions   Amlodipine Swelling   Cephalexin Other (See Comments)    unknown   Codeine Other (See Comments)    unknown   Metoclopramide Hcl Other (See Comments)    unknown   Nitrofuran Derivatives Other (See Comments)    unknown    Patient Measurements: Height: 5\' 6"  (167.6 cm) Weight: 65.8 kg (145 lb) IBW/kg (Calculated) : 59.3  Vital Signs: Temp: 98.1 F (36.7 C) (06/10 1348) Temp Source: Oral (06/10 1348) BP: 183/57 (06/10 1615) Pulse Rate: 49 (06/10 1615)  Labs: Recent Labs    06/16/20 1004 06/16/20 1204  HGB 13.3  --   HCT 41.1  --   PLT 223  --   LABPROT  --  30.7*  INR  --  3.0*  CREATININE  --  0.61  TROPONINIHS  --  11    Estimated Creatinine Clearance: 45.5 mL/min (by C-G formula based on SCr of 0.61 mg/dL).   Medical History: Past Medical History:  Diagnosis Date   Arthritis    right   Atrial fibrillation ([facility])    GERD (gastroesophageal reflux disease)    hx of   Hepatitis C    Humerus fracture    Hyperlipidemia    Hypertension    Irritable bowel syndrome    Meningioma ([facility])    PONV (postoperative nausea and vomiting)     Medications:  (Not in a hospital admission)   Assessment: 85 YOF who presented with dizziness. On warfarin at home for a history of Afib. Pharmacy consulted to resume home warfarin.   INR on presentation was therapeutic on admission. H/H and Plt wnl. SCr wnl   Home warfarin regimen: 3 mg on Mon and Fri; 6 mg on all other days   Goal of Therapy:  INR 2-3 Monitor platelets by anticoagulation protocol: Yes   Plan:  -Warfarin 3 mg x 1 dose today  -Monitor daily PT/INR and s/s of bleeding   Albertina Parr, PharmD., BCPS, BCCCP Clinical Pharmacist Please refer to [facility] for unit-specific pharmacist

## 2020-06-16 NOTE — ED Provider Notes (Signed)
[facility] EMERGENCY DEPARTMENT Provider Note   CSN: [id number] Arrival date & time: 06/16/20  0944     History Chief Complaint  Patient presents with   Dizziness    Gloria [name] is a 87 y.o. female.   Dizziness Associated symptoms: chest pain   Associated symptoms: no shortness of breath and no weakness   Patient presents with dizziness.  States this morning when she was going to get up she began to feel lightheaded.  States she was feeling as if she could fall over.  Has a slight headache.  States it is on the front of her head not the typical right side of the head.  No chest pain.  Also has been having abdominal pain/chest pain.  Is been happening for months now.  There may not be associate with eating.  Is worse with laying down.  She is on warfarin for atrial fibrillation.  Does not feel numb or weak specifically on either side of her body.  States she feels that she would have some difficulty if she had to get up and walk around however.  Patient also feels that she gets tightness in her feet sometimes.  Feels that they curl up.  States it feels that it starts in the arch of her foot.    Past Medical History:  Diagnosis Date   Arthritis    right   Atrial fibrillation (HCC)    GERD (gastroesophageal reflux disease)    hx of   Hepatitis C    Humerus fracture    Hyperlipidemia    Hypertension    Irritable bowel syndrome    Meningioma (HCC)    PONV (postoperative nausea and vomiting)     Patient Active Problem List   Diagnosis Date Noted   Long term (current) use of anticoagulants 02/10/2018   OA (osteoarthritis) of knee 11/24/2017   TIA (transient ischemic attack) 07/14/2016   Nonintractable headache    Warfarin anticoagulation 04/09/2011    Class: Chronic   Hypertension    Irritable bowel syndrome    Atrial fibrillation ([place])     Past Surgical History:  Procedure Laterality Date   BREAST SURGERY     lumpectomy   CARDIOVASCULAR STRESS  TEST  11/14/2006   EF 67%   COLONOSCOPY W/ BIOPSIES AND POLYPECTOMY     ORIF HUMERUS FRACTURE Right 04/18/2014   Procedure: OPEN REDUCTION INTERNAL FIXATION (ORIF) RIGHT PROXIMAL HUMERUS FRACTURE;  Surgeon: Netta Cedars, MD;  Location: [place];  Service: Orthopedics;  Laterality: Right;   ORIF PERIPROSTHETIC FRACTURE Left 04/30/2013   Procedure: OPEN REDUCTION INTERNAL FIXATION (ORIF) PERIPROSTHETIC FRACTURE;  Surgeon: [name], MD;  Location: [place];  Service: Orthopedics;  Laterality: Left;   PAROTID GLAND TUMOR EXCISION     TOTAL HIP ARTHROPLASTY  11/07   left   TOTAL KNEE ARTHROPLASTY     TOTAL KNEE ARTHROPLASTY Left 11/24/2017   Procedure: TOTAL KNEE ARTHROPLASTY;  Surgeon: Gaynelle Arabian, MD;  Location: [facility];  Service: Orthopedics;  Laterality: Left;  53min   TRANSTHORACIC ECHOCARDIOGRAM  10/25/2009   EF 55-60%     OB History   No obstetric history on file.     Family History  Problem Relation Age of Onset   Heart disease Brother    Breast cancer Neg Hx     Social History   Tobacco Use   Smoking status: Former    Packs/day: 0.30    Years: 15.00    Pack years: 4.50  Types: Cigarettes    Quit date: 06/07/1980    Years since quitting: 40.0   Smokeless tobacco: Never  Vaping Use   Vaping Use: Never used  Substance Use Topics   Alcohol use: No   Drug use: No    Home Medications Prior to Admission medications   Medication Sig Start Date End Date Taking? Authorizing Provider  atorvastatin (LIPITOR) 10 MG tablet Take 10 mg by mouth at bedtime.   Yes [provider]  ergocalciferol (VITAMIN D2) 1.25 MG (50000 UT) capsule Take 50,000 Units by mouth every Friday.   Yes [provider]  flecainide (TAMBOCOR) 50 MG tablet Take 1 tablet (50 mg total) by mouth 2 (two) times daily. 04/29/11  Yes Martinique, Peter M, MD  gabapentin (NEURONTIN) 100 MG capsule Take 100 mg by mouth daily as needed (pain). 02/06/20  Yes [provider]  hydrochlorothiazide  (HYDRODIURIL) 25 MG tablet Take 1 tablet by mouth once daily 05/11/20  Yes [place]ique, Peter M, MD  hydrocortisone cream 0.5 % Apply 1 application topically 2 (two) times daily.   Yes [provider]  losartan (COZAAR) 25 MG tablet Take 1 tablet by mouth once daily Patient taking differently: Take 25 mg by mouth at bedtime. 05/11/20  Yes [place]ique, Peter M, MD  Multiple Vitamins-Minerals (PRESERVISION AREDS PO) Take 1 capsule by mouth 2 (two) times daily.   Yes [provider]  nitroGLYCERIN (NITROSTAT) 0.4 MG SL tablet Place 1 tablet (0.4 mg total) under the tongue every 5 (five) minutes as needed for chest pain. 06/05/16  Yes Florencia Reasons, MD  omeprazole (PRILOSEC) 40 MG capsule Take 40 mg by mouth 2 (two) times daily.   Yes [provider]  Polyethyl Glycol-Propyl Glycol (SYSTANE) 0.4-0.3 % SOLN Place 1 drop into both eyes daily as needed.   Yes [provider]  PROCTO-MED HC 2.5 % rectal cream Apply 1 application topically 2 (two) times daily. 03/13/20  Yes [provider]  vitamin B-12 (CYANOCOBALAMIN) 1000 MCG tablet Take 1,000 mcg by mouth every evening.    Yes [provider]  warfarin (COUMADIN) 6 MG tablet Take 3-6 mg by mouth See admin instructions. Take 6 mg by mouth daily on Monday, Wednesday  Take 3 mg by mouth daily on all other days. 08/16/14  Yes [provider]  zolpidem (AMBIEN) 5 MG tablet Take 5 mg by mouth at bedtime as needed. 12/08/19  Yes [provider]    Allergies    Amlodipine, Cephalexin, Codeine, Metoclopramide hcl, and Nitrofuran derivatives  Review of Systems   Review of Systems  Constitutional:  Negative for appetite change and fever.  HENT:  Negative for congestion.   Respiratory:  Negative for shortness of breath.   Cardiovascular:  Positive for chest pain.  Gastrointestinal:  Positive for abdominal pain.  Genitourinary:  Negative for flank pain.  Skin:  Negative for rash.  Neurological:  Positive for  dizziness. Negative for weakness.  Psychiatric/Behavioral:  Negative for confusion.    Physical Exam Updated Vital Signs BP 138/66   Pulse (!) 54   Temp 98.1 F (36.7 C) (Oral)   Resp (!) 27   Ht 5\' 6"  (1.676 m)   Wt 65.8 kg   SpO2 99%   BMI 23.40 kg/m   Physical Exam Vitals and nursing note reviewed.  HENT:     Head: Atraumatic.  Eyes:     General: No scleral icterus.    Extraocular Movements: Extraocular movements intact.     Pupils:  Pupils are equal, round, and reactive to light.  Cardiovascular:     Rate and Rhythm: Regular rhythm.  Pulmonary:     Breath sounds: No wheezing.  Chest:     Chest wall: No tenderness.  Abdominal:     Tenderness: There is no abdominal tenderness.  Musculoskeletal:        General: No tenderness.     Cervical back: Neck supple.  Skin:    General: Skin is warm.     Coloration: Skin is not jaundiced.  Neurological:     Mental Status: She is alert and oriented to person, place, and time.     Comments: Strength intact bilaterally.  Face symmetric.  Eye movements intact without nystagmus.  Finger-nose intact bilaterally but patient performs very slowly.  Heel shin also appears grossly intact.    ED Results / Procedures / Treatments   Labs (all labs ordered are listed, but only abnormal results are displayed) Labs Reviewed  PROTIME-INR - Abnormal; Notable for the following components:      Result Value   Prothrombin Time 30.7 (*)    INR 3.0 (*)    All other components within normal limits  COMPREHENSIVE METABOLIC PANEL - Abnormal; Notable for the following components:   Potassium 3.4 (*)    Calcium 7.9 (*)    Total Protein 5.0 (*)    Albumin 3.0 (*)    All other components within normal limits  SARS CORONAVIRUS 2 (TAT 6-24 HRS)  CBC WITH DIFFERENTIAL/PLATELET  LIPASE, BLOOD  URINALYSIS, ROUTINE W REFLEX MICROSCOPIC  TROPONIN I (HIGH SENSITIVITY)    EKG EKG Interpretation  Date/Time:  Friday June 16 2020 09:51:23  EDT Ventricular Rate:  47 PR Interval:  190 QRS Duration: 108 QT Interval:  493 QTC Calculation: 436 R Axis:   -39 Text Interpretation: Sinus bradycardia Left ventricular hypertrophy Anterior infarct, old Confirmed by Davonna Belling [phone]) on 06/16/2020 11:01:37 AM  Radiology CT Angio Head W or Wo Contrast  Result Date: 06/16/2020 CLINICAL DATA:  Vertigo EXAM: CT ANGIOGRAPHY HEAD TECHNIQUE: Multidetector CT imaging of the head was performed using the standard protocol during bolus administration of intravenous contrast. Multiplanar CT image reconstructions and MIPs were obtained to evaluate the vascular anatomy. CONTRAST:  90mL OMNIPAQUE IOHEXOL 350 MG/ML SOLN COMPARISON:  2018 MRI head and MRA head FINDINGS: CT HEAD Brain: There is no acute intracranial hemorrhage or edema. Gray-white differentiation is preserved. There is no extra-axial fluid collection. Heavily calcified meningioma along the posterior right cerebral convexity abutting the superior sagittal sinus is not substantially changed. Prominence of the ventricles and sulci reflects mild generalized parenchymal volume loss. Patchy hypoattenuation in the supratentorial white matter is nonspecific but may reflect moderate chronic microvascular ischemic changes. Chronic small vessel infarcts of the right basal ganglia. Vascular: Better evaluated on CTA portion.  No hyperdense vessel. Skull: Unremarkable. Sinuses/Orbits: No acute finding. Other: None. Review of the MIP images confirms the above findings CTA NECK Aortic arch: Mixed plaque along the visualized arch and at the patent great vessel origins. Right carotid system: Patent. Primarily calcified plaque at the common carotid bifurcation and proximal internal carotid with less than 50% stenosis. Left carotid system: Patent. Calcified plaque at the common carotid bifurcation and proximal internal carotid causing less than 50% stenosis. Vertebral arteries: Patent. Right vertebral artery  dominant. No significant stenosis or evidence of dissection. Skeleton: Degenerative changes of the included spine. Other neck: Unremarkable. Upper chest: No apical lung mass. Review of the MIP images confirms the above  findings CTA HEAD Anterior circulation: Intracranial internal carotid arteries are patent with calcified plaque but no significant stenosis. Middle cerebral arteries are patent. Posterior circulation: Intracranial vertebral arteries are patent with minimal calcified plaque on the left. Basilar artery is patent. Major cerebellar artery origins are patent. Posterior cerebral arteries are patent. Venous sinuses: Patent as allowed by contrast bolus timing. Review of the MIP images confirms the above findings IMPRESSION: No acute intracranial abnormality. Chronic microvascular ischemic changes. Chronic right basal ganglia infarcts. Similar small heavily calcified meningioma. No large vessel occlusion or evidence of dissection. Plaque at the ICA origins causes less than 50% stenosis. Electronically Signed   By: Macy Mis M.D.   On: 06/16/2020 14:48   CT Angio Neck W and/or Wo Contrast  Result Date: 06/16/2020 CLINICAL DATA:  Vertigo EXAM: CT ANGIOGRAPHY HEAD TECHNIQUE: Multidetector CT imaging of the head was performed using the standard protocol during bolus administration of intravenous contrast. Multiplanar CT image reconstructions and MIPs were obtained to evaluate the vascular anatomy. CONTRAST:  23mL OMNIPAQUE IOHEXOL 350 MG/ML SOLN COMPARISON:  2018 MRI head and MRA head FINDINGS: CT HEAD Brain: There is no acute intracranial hemorrhage or edema. Gray-white differentiation is preserved. There is no extra-axial fluid collection. Heavily calcified meningioma along the posterior right cerebral convexity abutting the superior sagittal sinus is not substantially changed. Prominence of the ventricles and sulci reflects mild generalized parenchymal volume loss. Patchy hypoattenuation in the  supratentorial white matter is nonspecific but may reflect moderate chronic microvascular ischemic changes. Chronic small vessel infarcts of the right basal ganglia. Vascular: Better evaluated on CTA portion.  No hyperdense vessel. Skull: Unremarkable. Sinuses/Orbits: No acute finding. Other: None. Review of the MIP images confirms the above findings CTA NECK Aortic arch: Mixed plaque along the visualized arch and at the patent great vessel origins. Right carotid system: Patent. Primarily calcified plaque at the common carotid bifurcation and proximal internal carotid with less than 50% stenosis. Left carotid system: Patent. Calcified plaque at the common carotid bifurcation and proximal internal carotid causing less than 50% stenosis. Vertebral arteries: Patent. Right vertebral artery dominant. No significant stenosis or evidence of dissection. Skeleton: Degenerative changes of the included spine. Other neck: Unremarkable. Upper chest: No apical lung mass. Review of the MIP images confirms the above findings CTA HEAD Anterior circulation: Intracranial internal carotid arteries are patent with calcified plaque but no significant stenosis. Middle cerebral arteries are patent. Posterior circulation: Intracranial vertebral arteries are patent with minimal calcified plaque on the left. Basilar artery is patent. Major cerebellar artery origins are patent. Posterior cerebral arteries are patent. Venous sinuses: Patent as allowed by contrast bolus timing. Review of the MIP images confirms the above findings IMPRESSION: No acute intracranial abnormality. Chronic microvascular ischemic changes. Chronic right basal ganglia infarcts. Similar small heavily calcified meningioma. No large vessel occlusion or evidence of dissection. Plaque at the ICA origins causes less than 50% stenosis. Electronically Signed   By: Macy Mis M.D.   On: 06/16/2020 14:48   DG Chest Portable 1 View  Result Date: 06/16/2020 CLINICAL DATA:   Chest pain EXAM: PORTABLE CHEST 1 VIEW COMPARISON:  CT chest dated 12/12/2017 FINDINGS: Mild left basilar scarring. No focal consolidation. No pleural effusion or pneumothorax. The heart is normal in size.  Thoracic aortic atherosclerosis. IMPRESSION: No evidence of acute cardiopulmonary disease. Electronically Signed   By: Julian Hy M.D.   On: 06/16/2020 10:48    Procedures Procedures   Medications Ordered in ED Medications  potassium chloride  10 mEq in 100 mL IVPB (has no administration in time range)  hydrALAZINE (APRESOLINE) injection 10 mg (10 mg Intravenous Given 06/16/20 1236)  iohexol (OMNIPAQUE) 350 MG/ML injection 75 mL (75 mLs Intravenous Contrast Given 06/16/20 1434)    ED Course  I have reviewed the triage vital signs and the nursing notes.  Pertinent labs & imaging results that were available during my care of the patient were reviewed by me and considered in my medical decision making (see chart for details).    MDM Rules/Calculators/A&P                          Patient with dizziness.  Woke with it.  Feels as if she will fall over if she stands.  Not like she is going to pass out.  Has a history of A. fib.  Appears to be in sinus bradycardia right now.  Rate is similar to what she has had before.  Head CT done and reassuring.  Finger-nose done and was able to do it but it was very slow.  Medicines given to help with her severely elevated blood pressure.  Blood pressure down and states headache is improved but now more pain in her feet.  Has a hypokalemia and will supplement that to see if that will help with the feet.  However with potential of dizziness as a central cause were admitted to medicine for further work-up.  Has also had chest pain abdominal pain.  Lab work and EKG reassuring.  Doubt cardiac ischemia. Final Clinical Impression(s) / ED Diagnoses Final diagnoses:  Dizziness  Bradycardia  Hypokalemia    Rx / DC Orders ED Discharge Orders     None         Davonna Belling, MD 06/16/20 1520

## 2020-06-16 NOTE — ED Notes (Signed)
MD is aware of pt bp and HR. EKG done when pt arrived in [facility].

## 2020-06-16 NOTE — ED Notes (Signed)
Lab called at this time regarding PT INR, troponin, CMP and lipase clotted. Labs resent by this RN. Spoke to CT regarding clotted labs. CT delayed at this time. Pending results.

## 2020-06-16 NOTE — H&P (Addendum)
History and Physical    Gloria [name] RXV:[id number] DOB: 1930-05-18 DOA: 06/16/2020  Referring MD/NP/PA: Davonna Belling, MD PCP: Leanna Battles, MD  Consultants: Peter Martinique, MD-cardiology Patient coming from: Home  Chief Complaint: Dizziness  I have personally briefly reviewed patient's old medical records in [place]   HPI: Gloria [name] is a 87 y.o. female with medical history significant of hypertension, hyperlipidemia, paroxysmal atrial fibrillation on Coumadin, hepatitis C, and GERD presents with complaints of dizziness.  Symptoms started after she had woken up this morning and was getting out of bed.  Complained of feeling like the room may have been spinning around her.  Denies any loss of consciousness and did not fall.  Patient reports associated symptoms of frontal headache, epigastric pain, left-sided chest pain, severe bilateral foot cramping, and now shortness of breath.  Denies having any focal weakness, slurred speech, facial droop, or changes in vision.  She had recently tested positive for COVID-19 on 5/26 for the patient's daughter.  She reports that she had been trying to eat bananas and yogurt at home.  Denies having any episodes of vomiting or dysuria.  ED Course: Upon admission into the emergency department patient was seen to be afebrile, pulse 42-93, respirations 12-28, blood pressure 138/66- 209/76, and O2 saturation maintained on room air.  Labs significant for potassium 3.4, calcium 7.9, albumin 3, high-sensitivity troponin 11, and INR 3.  Urinalysis significant for elevated pH and ketones.  CT scan of the head and cervical spine showed no acute intercranial abnormality and similar small heavily calcified meningioma.  Chest x-ray only noted mild basilar scarring without any acute abnormality.  Patient has been given hydralazine 10 mg IV and 10 mEq of potassium chloride.  TRH called to admit.  Review of Systems  Constitutional:  Positive for  malaise/fatigue. Negative for fever.  Eyes:  Negative for photophobia and pain.  Respiratory:  Positive for shortness of breath.   Cardiovascular:  Positive for chest pain. Negative for leg swelling.  Gastrointestinal:  Positive for abdominal pain.  Musculoskeletal:  Positive for myalgias. Negative for falls.  Skin:  Negative for rash.  Neurological:  Positive for dizziness and headaches. Negative for speech change and focal weakness.  Psychiatric/Behavioral:  Negative for substance abuse. The patient is nervous/anxious.    Past Medical History:  Diagnosis Date   Arthritis    right   Atrial fibrillation (HCC)    GERD (gastroesophageal reflux disease)    hx of   Hepatitis C    Humerus fracture    Hyperlipidemia    Hypertension    Irritable bowel syndrome    Meningioma (HCC)    PONV (postoperative nausea and vomiting)     Past Surgical History:  Procedure Laterality Date   BREAST SURGERY     lumpectomy   CARDIOVASCULAR STRESS TEST  11/14/2006   EF 67%   COLONOSCOPY W/ BIOPSIES AND POLYPECTOMY     ORIF HUMERUS FRACTURE Right 04/18/2014   Procedure: OPEN REDUCTION INTERNAL FIXATION (ORIF) RIGHT PROXIMAL HUMERUS FRACTURE;  Surgeon: Netta Cedars, MD;  Location: [place];  Service: Orthopedics;  Laterality: Right;   ORIF PERIPROSTHETIC FRACTURE Left 04/30/2013   Procedure: OPEN REDUCTION INTERNAL FIXATION (ORIF) PERIPROSTHETIC FRACTURE;  Surgeon: [name], MD;  Location: [facility];  Service: Orthopedics;  Laterality: Left;   PAROTID GLAND TUMOR EXCISION     TOTAL HIP ARTHROPLASTY  11/07   left   TOTAL KNEE ARTHROPLASTY     TOTAL KNEE ARTHROPLASTY Left 11/24/2017  Procedure: TOTAL KNEE ARTHROPLASTY;  Surgeon: Gaynelle Arabian, MD;  Location: [facility];  Service: Orthopedics;  Laterality: Left;  45min   TRANSTHORACIC ECHOCARDIOGRAM  10/25/2009   EF 55-60%     reports that she quit smoking about 40 years ago. Her smoking use included cigarettes. She has a 4.50 pack-year smoking history.  She has never used smokeless tobacco. She reports that she does not drink alcohol and does not use drugs.  Allergies  Allergen Reactions   Amlodipine Swelling   Cephalexin Other (See Comments)    unknown   Codeine Other (See Comments)    unknown   Metoclopramide Hcl Other (See Comments)    unknown   Nitrofuran Derivatives Other (See Comments)    unknown    Family History  Problem Relation Age of Onset   Heart disease Brother    Breast cancer Neg Hx     Prior to Admission medications   Medication Sig Start Date End Date Taking? Authorizing Provider  atorvastatin (LIPITOR) 10 MG tablet Take 10 mg by mouth at bedtime.   Yes [provider]  ergocalciferol (VITAMIN D2) 1.25 MG (50000 UT) capsule Take 50,000 Units by mouth every Friday.   Yes [provider]  flecainide (TAMBOCOR) 50 MG tablet Take 1 tablet (50 mg total) by mouth 2 (two) times daily. 04/29/11  Yes Martinique, Peter M, MD  gabapentin (NEURONTIN) 100 MG capsule Take 100 mg by mouth daily as needed (pain). 02/06/20  Yes [provider]  hydrochlorothiazide (HYDRODIURIL) 25 MG tablet Take 1 tablet by mouth once daily 05/11/20  Yes Martinique, Peter M, MD  hydrocortisone cream 0.5 % Apply 1 application topically 2 (two) times daily.   Yes [provider]  losartan (COZAAR) 25 MG tablet Take 1 tablet by mouth once daily Patient taking differently: Take 25 mg by mouth at bedtime. 05/11/20  Yes Martinique, Peter M, MD  Multiple Vitamins-Minerals (PRESERVISION AREDS PO) Take 1 capsule by mouth 2 (two) times daily.   Yes [provider]  nitroGLYCERIN (NITROSTAT) 0.4 MG SL tablet Place 1 tablet (0.4 mg total) under the tongue every 5 (five) minutes as needed for chest pain. 06/05/16  Yes Florencia Reasons, MD  omeprazole (PRILOSEC) 40 MG capsule Take 40 mg by mouth 2 (two) times daily.   Yes [provider]  Polyethyl Glycol-Propyl Glycol (SYSTANE) 0.4-0.3 % SOLN Place 1 drop into both eyes daily as  needed.   Yes [provider]  PROCTO-MED HC 2.5 % rectal cream Apply 1 application topically 2 (two) times daily. 03/13/20  Yes [provider]  vitamin B-12 (CYANOCOBALAMIN) 1000 MCG tablet Take 1,000 mcg by mouth every evening.    Yes [provider]  warfarin (COUMADIN) 6 MG tablet Take 3-6 mg by mouth See admin instructions. Take 6 mg by mouth daily on Monday, Wednesday  Take 3 mg by mouth daily on all other days. 08/16/14  Yes [provider]  zolpidem (AMBIEN) 5 MG tablet Take 5 mg by mouth at bedtime as needed. 12/08/19  Yes [provider]    Physical Exam:  Constitutional: Elderly female who appears to be in distress Vitals:   06/16/20 1300 06/16/20 1345 06/16/20 1348 06/16/20 1445  BP: (!) 147/51 (!) 172/72  138/66  Pulse: (!) 53 93  (!) 54  Resp: (!) 21 (!) 22  (!) 27  Temp:   98.1 F (36.7 C)   TempSrc:   Oral   SpO2: 98% 97%  99%  Weight:  Height:       Eyes: PERRL, lids and conjunctivae normal ENMT: Mucous membranes are moist. Posterior pharynx clear of any exudate or lesions.  Neck: normal, supple, no masses, no thyromegaly Respiratory: clear to auscultation bilaterally, no wheezing, no crackles. Normal respiratory effort. No accessory muscle use.  Cardiovascular: Bradycardic, no murmurs / rubs / gallops. No extremity edema. 2+ pedal pulses. No carotid bruits.  Abdomen: no tenderness, no masses palpated. No hepatosplenomegaly. Bowel sounds positive.  Musculoskeletal: no clubbing / cyanosis.  Tenderness to palpation of bilateral lower extremities Skin: Bruising present of the arm and poor skin turgor Neurologic: CN 2-12 grossly intact. Sensation intact, DTR normal. Strength 5/5 in all 4.  Psychiatric: Normal judgment and insight. Alert and oriented x 3.  Anxious mood.     Labs on Admission: I have personally reviewed following labs and imaging studies  CBC: Recent Labs  Lab 06/16/20 1004  WBC 6.5  NEUTROABS 4.7   HGB 13.3  HCT 41.1  MCV 90.9  PLT 353   Basic Metabolic Panel: Recent Labs  Lab 06/16/20 1204  NA 138  K 3.4*  CL 108  CO2 22  GLUCOSE 82  BUN 13  CREATININE 0.61  CALCIUM 7.9*   GFR: Estimated Creatinine Clearance: 45.5 mL/min (by C-G formula based on SCr of 0.61 mg/dL). Liver Function Tests: Recent Labs  Lab 06/16/20 1204  AST 22  ALT 14  ALKPHOS 87  BILITOT 1.0  PROT 5.0*  ALBUMIN 3.0*   Recent Labs  Lab 06/16/20 1204  LIPASE 37   No results for input(s): AMMONIA in the last 168 hours. Coagulation Profile: Recent Labs  Lab 06/16/20 1204  INR 3.0*   Cardiac Enzymes: No results for input(s): CKTOTAL, CKMB, CKMBINDEX, TROPONINI in the last 168 hours. BNP (last 3 results) No results for input(s): PROBNP in the last 8760 hours. HbA1C: No results for input(s): HGBA1C in the last 72 hours. CBG: No results for input(s): GLUCAP in the last 168 hours. Lipid Profile: No results for input(s): CHOL, HDL, LDLCALC, TRIG, CHOLHDL, LDLDIRECT in the last 72 hours. Thyroid Function Tests: No results for input(s): TSH, T4TOTAL, FREET4, T3FREE, THYROIDAB in the last 72 hours. Anemia Panel: No results for input(s): VITAMINB12, FOLATE, FERRITIN, TIBC, IRON, RETICCTPCT in the last 72 hours. Urine analysis:    Component Value Date/Time   COLORURINE YELLOW 05/19/2013 0917   APPEARANCEUR CLOUDY (A) 05/19/2013 0917   LABSPEC 1.012 05/19/2013 0917   PHURINE 7.5 05/19/2013 0917   GLUCOSEU NEGATIVE 05/19/2013 0917   HGBUR NEGATIVE 05/19/2013 0917   BILIRUBINUR NEGATIVE 05/19/2013 0917   KETONESUR NEGATIVE 05/19/2013 0917   PROTEINUR NEGATIVE 05/19/2013 0917   UROBILINOGEN 0.2 05/19/2013 0917   NITRITE NEGATIVE 05/19/2013 0917   LEUKOCYTESUR NEGATIVE 05/19/2013 0917   Sepsis Labs: No results found for this or any previous visit (from the past 240 hour(s)).   Radiological Exams on Admission: CT Angio Head W or Wo Contrast  Result Date: 06/16/2020 CLINICAL DATA:   Vertigo EXAM: CT ANGIOGRAPHY HEAD TECHNIQUE: Multidetector CT imaging of the head was performed using the standard protocol during bolus administration of intravenous contrast. Multiplanar CT image reconstructions and MIPs were obtained to evaluate the vascular anatomy. CONTRAST:  19mL OMNIPAQUE IOHEXOL 350 MG/ML SOLN COMPARISON:  2018 MRI head and MRA head FINDINGS: CT HEAD Brain: There is no acute intracranial hemorrhage or edema. Gray-white differentiation is preserved. There is no extra-axial fluid collection. Heavily calcified meningioma along the posterior right cerebral convexity abutting the superior sagittal  sinus is not substantially changed. Prominence of the ventricles and sulci reflects mild generalized parenchymal volume loss. Patchy hypoattenuation in the supratentorial white matter is nonspecific but may reflect moderate chronic microvascular ischemic changes. Chronic small vessel infarcts of the right basal ganglia. Vascular: Better evaluated on CTA portion.  No hyperdense vessel. Skull: Unremarkable. Sinuses/Orbits: No acute finding. Other: None. Review of the MIP images confirms the above findings CTA NECK Aortic arch: Mixed plaque along the visualized arch and at the patent great vessel origins. Right carotid system: Patent. Primarily calcified plaque at the common carotid bifurcation and proximal internal carotid with less than 50% stenosis. Left carotid system: Patent. Calcified plaque at the common carotid bifurcation and proximal internal carotid causing less than 50% stenosis. Vertebral arteries: Patent. Right vertebral artery dominant. No significant stenosis or evidence of dissection. Skeleton: Degenerative changes of the included spine. Other neck: Unremarkable. Upper chest: No apical lung mass. Review of the MIP images confirms the above findings CTA HEAD Anterior circulation: Intracranial internal carotid arteries are patent with calcified plaque but no significant stenosis. Middle  cerebral arteries are patent. Posterior circulation: Intracranial vertebral arteries are patent with minimal calcified plaque on the left. Basilar artery is patent. Major cerebellar artery origins are patent. Posterior cerebral arteries are patent. Venous sinuses: Patent as allowed by contrast bolus timing. Review of the MIP images confirms the above findings IMPRESSION: No acute intracranial abnormality. Chronic microvascular ischemic changes. Chronic right basal ganglia infarcts. Similar small heavily calcified meningioma. No large vessel occlusion or evidence of dissection. Plaque at the ICA origins causes less than 50% stenosis. Electronically Signed   By: Macy Mis M.D.   On: 06/16/2020 14:48   CT Angio Neck W and/or Wo Contrast  Result Date: 06/16/2020 CLINICAL DATA:  Vertigo EXAM: CT ANGIOGRAPHY HEAD TECHNIQUE: Multidetector CT imaging of the head was performed using the standard protocol during bolus administration of intravenous contrast. Multiplanar CT image reconstructions and MIPs were obtained to evaluate the vascular anatomy. CONTRAST:  20mL OMNIPAQUE IOHEXOL 350 MG/ML SOLN COMPARISON:  2018 MRI head and MRA head FINDINGS: CT HEAD Brain: There is no acute intracranial hemorrhage or edema. Gray-white differentiation is preserved. There is no extra-axial fluid collection. Heavily calcified meningioma along the posterior right cerebral convexity abutting the superior sagittal sinus is not substantially changed. Prominence of the ventricles and sulci reflects mild generalized parenchymal volume loss. Patchy hypoattenuation in the supratentorial white matter is nonspecific but may reflect moderate chronic microvascular ischemic changes. Chronic small vessel infarcts of the right basal ganglia. Vascular: Better evaluated on CTA portion.  No hyperdense vessel. Skull: Unremarkable. Sinuses/Orbits: No acute finding. Other: None. Review of the MIP images confirms the above findings CTA NECK Aortic arch:  Mixed plaque along the visualized arch and at the patent great vessel origins. Right carotid system: Patent. Primarily calcified plaque at the common carotid bifurcation and proximal internal carotid with less than 50% stenosis. Left carotid system: Patent. Calcified plaque at the common carotid bifurcation and proximal internal carotid causing less than 50% stenosis. Vertebral arteries: Patent. Right vertebral artery dominant. No significant stenosis or evidence of dissection. Skeleton: Degenerative changes of the included spine. Other neck: Unremarkable. Upper chest: No apical lung mass. Review of the MIP images confirms the above findings CTA HEAD Anterior circulation: Intracranial internal carotid arteries are patent with calcified plaque but no significant stenosis. Middle cerebral arteries are patent. Posterior circulation: Intracranial vertebral arteries are patent with minimal calcified plaque on the left. Basilar artery is patent.  Major cerebellar artery origins are patent. Posterior cerebral arteries are patent. Venous sinuses: Patent as allowed by contrast bolus timing. Review of the MIP images confirms the above findings IMPRESSION: No acute intracranial abnormality. Chronic microvascular ischemic changes. Chronic right basal ganglia infarcts. Similar small heavily calcified meningioma. No large vessel occlusion or evidence of dissection. Plaque at the ICA origins causes less than 50% stenosis. Electronically Signed   By: Macy Mis M.D.   On: 06/16/2020 14:48   DG Chest Portable 1 View  Result Date: 06/16/2020 CLINICAL DATA:  Chest pain EXAM: PORTABLE CHEST 1 VIEW COMPARISON:  CT chest dated 12/12/2017 FINDINGS: Mild left basilar scarring. No focal consolidation. No pleural effusion or pneumothorax. The heart is normal in size.  Thoracic aortic atherosclerosis. IMPRESSION: No evidence of acute cardiopulmonary disease. Electronically Signed   By: Julian Hy M.D.   On: 06/16/2020 10:48     EKG: Independently reviewed.  Sinus bradycardia at 47 bpm  Assessment/Plan Dizziness: Acute.  Patient present with complaints of dizziness upon waking up this morning.  She reports that she recently had tested positive for COVID-19 memorial day possibly decreased p.o. intake. -Admit to a medical telemetry bed -Neuro-Checks -Check MRI of the brain rule out the possibility of stroke -Vestibular PT evaluation in a.m.  Recent COVID-19 infection: Patient reports testing positive for COVID-19 sometime around 5/30.  COVID-19 screening was still pending as no positive result on file. -Follow-up COVID-19 screening.  Hypertensive urgency/emergency: Acute.  On admission patient presented with blood pressures elevated up to 209/76.  Home blood pressure medications include flecainide 50 mg twice daily, hydrochlorothiazide 25 mg daily, and losartan 25 mg daily.  Question the possibility of PRES. -Continue losartan -Held hydrochlorothiazide as patient appears to be little dehydrated -Hydralazine IV as needed for elevated blood  Chest/epigastric pain: Acute.  Initial high-sensitivity troponin negative and EKG without significant ischemic changes. -Trend cardiac troponin -Check echocardiogram  Shortness of breath: Patient reports feeling short of breath, but chest x-ray otherwise clear.  O2 saturations maintained on room air.  Low suspicion for PE as patient has therapeutic INR of 3.  She patient does not appear to be acutely fluid overloaded. -Continuous pulse oximetry with nasal cannula oxygen maintain O2 saturation greater than 92%. -Add-on BNP  Sinus bradycardia: On admission patient with heart rates into the 40s.  During last evaluation with Dr. Martinique heart rates were in the 30s.  Case was discussed with Dr. Cathie Olden who got in contact with Dr. Martinique and was okay with holding flecainide for now. -Follow-up telemetry -Hold flecainide   Hypokalemia: Acute.  Potassium just mildly low at 3.4 on  admission.  Question if this is the cause for patient's cramping.  Patient was given 10 mEq of potassium chloride IV. -Give 40 mEq of potassium chloride p.o. -Check magnesium level  Paroxysmal atrial fibrillation on chronic anticoagulation: Patient appears to be in sinus rhythm at this time.  INR was therapeutic at 3. -Continue Coumadin per pharmacy -Flecainide currently on hold due to bradycardia  Hyperlipidemia -Continue atorvastatin  GERD -Pharmacy substitution of Protonix IV for omeprazole  DVT prophylaxis: Coumadin Code Status: Full Family Communication: Patient declined Disposition Plan: To be determined Consults called: Case discussed with cardiology over the phone Admission status: observation  Norval Morton MD [facility]   If 7PM-7AM, please contact night-coverage   06/16/2020, 3:19 PM

## 2020-06-16 NOTE — ED Notes (Signed)
Pt is complaining 10/10 pain scale to mid chest and upper abdomen area. MD notified. Waiting for orders.

## 2020-06-17 ENCOUNTER — Other Ambulatory Visit (HOSPITAL_COMMUNITY): Payer: Medicare Other

## 2020-06-17 ENCOUNTER — Observation Stay (HOSPITAL_COMMUNITY): Payer: Medicare Other

## 2020-06-17 DIAGNOSIS — H8112 Benign paroxysmal vertigo, left ear: Secondary | ICD-10-CM | POA: Diagnosis present

## 2020-06-17 DIAGNOSIS — Z79899 Other long term (current) drug therapy: Secondary | ICD-10-CM | POA: Diagnosis not present

## 2020-06-17 DIAGNOSIS — Z8249 Family history of ischemic heart disease and other diseases of the circulatory system: Secondary | ICD-10-CM | POA: Diagnosis not present

## 2020-06-17 DIAGNOSIS — I10 Essential (primary) hypertension: Secondary | ICD-10-CM | POA: Diagnosis present

## 2020-06-17 DIAGNOSIS — I480 Paroxysmal atrial fibrillation: Secondary | ICD-10-CM | POA: Diagnosis present

## 2020-06-17 DIAGNOSIS — R0602 Shortness of breath: Secondary | ICD-10-CM | POA: Diagnosis not present

## 2020-06-17 DIAGNOSIS — K219 Gastro-esophageal reflux disease without esophagitis: Secondary | ICD-10-CM | POA: Diagnosis present

## 2020-06-17 DIAGNOSIS — Z885 Allergy status to narcotic agent status: Secondary | ICD-10-CM | POA: Diagnosis not present

## 2020-06-17 DIAGNOSIS — Z20822 Contact with and (suspected) exposure to covid-19: Secondary | ICD-10-CM | POA: Diagnosis present

## 2020-06-17 DIAGNOSIS — Z7901 Long term (current) use of anticoagulants: Secondary | ICD-10-CM | POA: Diagnosis not present

## 2020-06-17 DIAGNOSIS — Z96652 Presence of left artificial knee joint: Secondary | ICD-10-CM | POA: Diagnosis present

## 2020-06-17 DIAGNOSIS — Z8673 Personal history of transient ischemic attack (TIA), and cerebral infarction without residual deficits: Secondary | ICD-10-CM | POA: Diagnosis not present

## 2020-06-17 DIAGNOSIS — I160 Hypertensive urgency: Secondary | ICD-10-CM | POA: Diagnosis present

## 2020-06-17 DIAGNOSIS — B1920 Unspecified viral hepatitis C without hepatic coma: Secondary | ICD-10-CM | POA: Diagnosis present

## 2020-06-17 DIAGNOSIS — E860 Dehydration: Secondary | ICD-10-CM | POA: Diagnosis present

## 2020-06-17 DIAGNOSIS — Z96642 Presence of left artificial hip joint: Secondary | ICD-10-CM | POA: Diagnosis present

## 2020-06-17 DIAGNOSIS — R079 Chest pain, unspecified: Secondary | ICD-10-CM | POA: Diagnosis present

## 2020-06-17 DIAGNOSIS — R001 Bradycardia, unspecified: Secondary | ICD-10-CM | POA: Diagnosis present

## 2020-06-17 DIAGNOSIS — Z888 Allergy status to other drugs, medicaments and biological substances status: Secondary | ICD-10-CM | POA: Diagnosis not present

## 2020-06-17 DIAGNOSIS — E876 Hypokalemia: Secondary | ICD-10-CM | POA: Diagnosis present

## 2020-06-17 DIAGNOSIS — H8110 Benign paroxysmal vertigo, unspecified ear: Secondary | ICD-10-CM | POA: Diagnosis present

## 2020-06-17 DIAGNOSIS — R42 Dizziness and giddiness: Secondary | ICD-10-CM | POA: Diagnosis not present

## 2020-06-17 DIAGNOSIS — Z8616 Personal history of COVID-19: Secondary | ICD-10-CM | POA: Diagnosis not present

## 2020-06-17 DIAGNOSIS — M1990 Unspecified osteoarthritis, unspecified site: Secondary | ICD-10-CM | POA: Diagnosis present

## 2020-06-17 DIAGNOSIS — E785 Hyperlipidemia, unspecified: Secondary | ICD-10-CM | POA: Diagnosis present

## 2020-06-17 DIAGNOSIS — Z87891 Personal history of nicotine dependence: Secondary | ICD-10-CM | POA: Diagnosis not present

## 2020-06-17 LAB — BASIC METABOLIC PANEL
Anion gap: 9 (ref 5–15)
BUN: 12 mg/dL (ref 8–23)
CO2: 23 mmol/L (ref 22–32)
Calcium: 9.4 mg/dL (ref 8.9–10.3)
Chloride: 104 mmol/L (ref 98–111)
Creatinine, Ser: 0.72 mg/dL (ref 0.44–1.00)
GFR, Estimated: >60 mL/min (ref >60)
Glucose, Bld: 84 mg/dL (ref 70–99)
Potassium: 4.3 mmol/L (ref 3.5–5.1)
Sodium: 136 mmol/L (ref 135–145)

## 2020-06-17 LAB — CBC
HCT: 40.6 % (ref 36.0–46.0)
Hemoglobin: 13.3 g/dL (ref 12.0–15.0)
MCH: 30 pg (ref 26.0–34.0)
MCHC: 32.8 g/dL (ref 30.0–36.0)
MCV: 91.4 fL (ref 80.0–100.0)
Platelets: 225 10*3/uL (ref 150–400)
RBC: 4.44 MIL/uL (ref 3.87–5.11)
RDW: 14 % (ref 11.5–15.5)
WBC: 5 10*3/uL (ref 4.0–10.5)
nRBC: 0 % (ref 0.0–0.2)

## 2020-06-17 LAB — ECHOCARDIOGRAM COMPLETE
AR max vel: 1.99 cm2
AV Area VTI: 1.82 cm2
AV Area mean vel: 1.63 cm2
AV Mean grad: 9 mmHg
AV Peak grad: 17.3 mmHg
Ao pk vel: 2.08 m/s
Area-P 1/2: 2.69 cm2
Height: 66 in
MV VTI: 2.11 cm2
S' Lateral: 3.5 cm
Weight: 2320 oz

## 2020-06-17 LAB — PROTIME-INR
INR: 1.9 — ABNORMAL HIGH (ref 0.8–1.2)
Prothrombin Time: 21.9 seconds — ABNORMAL HIGH (ref 11.4–15.2)

## 2020-06-17 MED ORDER — SODIUM CHLORIDE 0.9 % IV SOLN: INTRAVENOUS | Status: AC

## 2020-06-17 MED ORDER — LEVALBUTEROL HCL 0.63 MG/3ML IN NEBU: 0.6300 mg | INHALATION_SOLUTION | Freq: Four times a day (QID) | RESPIRATORY_TRACT | Status: DC | PRN

## 2020-06-17 MED ORDER — IPRATROPIUM BROMIDE 0.02 % IN SOLN: 0.5000 mg | Freq: Three times a day (TID) | RESPIRATORY_TRACT | Status: DC

## 2020-06-17 MED ORDER — ALUM & MAG HYDROXIDE-SIMETH 200-200-20 MG/5ML PO SUSP: 30.0000 mL | Freq: Four times a day (QID) | ORAL | Status: DC | PRN

## 2020-06-17 MED ORDER — MOMETASONE FURO-FORMOTEROL FUM 100-5 MCG/ACT IN AERO
2.0000 | INHALATION_SPRAY | Freq: Two times a day (BID) | RESPIRATORY_TRACT | Status: DC
  Administered 2020-06-17 – 2020-06-18 (×2): 2 via RESPIRATORY_TRACT
  Filled 2020-06-17: qty 8.8

## 2020-06-17 MED ORDER — HYDRALAZINE HCL 25 MG PO TABS
25.0000 mg | ORAL_TABLET | Freq: Two times a day (BID) | ORAL | Status: DC
  Administered 2020-06-17 – 2020-06-18 (×2): 25 mg via ORAL
  Filled 2020-06-17 (×2): qty 1

## 2020-06-17 MED ORDER — LEVALBUTEROL HCL 0.63 MG/3ML IN NEBU: 0.6300 mg | INHALATION_SOLUTION | Freq: Three times a day (TID) | RESPIRATORY_TRACT | Status: DC

## 2020-06-17 MED ORDER — WARFARIN SODIUM 3 MG PO TABS
6.0000 mg | ORAL_TABLET | Freq: Once | ORAL | Status: AC
  Administered 2020-06-17: 6 mg via ORAL
  Filled 2020-06-17: qty 1
  Filled 2020-06-17: qty 2

## 2020-06-17 MED ORDER — LIDOCAINE VISCOUS HCL 2 % MT SOLN: 15.0000 mL | Freq: Four times a day (QID) | OROMUCOSAL | Status: DC | PRN

## 2020-06-17 MED ORDER — IPRATROPIUM BROMIDE 0.02 % IN SOLN: 0.5000 mg | Freq: Four times a day (QID) | RESPIRATORY_TRACT | Status: DC | PRN

## 2020-06-17 NOTE — Evaluation (Signed)
Occupational Therapy Evaluation Patient Details Name: Gloria [name] MRN: [id number] DOB: 17-Aug-1930 Today's Date: 06/17/2020    History of Present Illness 87 y.o. female presented with complaints of dizziness. Began when woke up and tried to get OOB. 5/26 +COVID 19; CT head and cervical spine no acute changes; BP 209/76 HR 40s; + chest pain with EKG normal and high sensitivity troponin negative;   PMH- significant of hypertension, hyperlipidemia, paroxysmal atrial fibrillation on Coumadin, hepatitis C, and GERD   Clinical Impression   PTA patient independent with ADLs, mobility and light IADLs. Admitted for above and presenting with problem list below, including generalized weakness, decreased activity tolerance, mild dizziness and impaired balance.  Pt completing bed mobility with supervision, transfers and in room mobility with min guard to min assist using RW, and ADLs with up to min assist.  She fatigues easily during session. Patient requires cueing for safety during session.  Believe she will benefit from continued OT services while admitted and after dc at [facility] level to optimize independence, safety with ADLs, mobility given 24/7 support initally.     Follow Up Recommendations  Home health OT;Supervision/Assistance - 24 hour    Equipment Recommendations  3 in 1 bedside commode    Recommendations for Other Services       Precautions / Restrictions Precautions Precautions: Fall Restrictions Weight Bearing Restrictions: No      Mobility Bed Mobility Overal bed mobility: Needs Assistance Bed Mobility: Supine to Sit;Sit to Supine     Supine to sit: Supervision Sit to supine: Supervision   General bed mobility comments: for safety, line mgmt    Transfers Overall transfer level: Needs assistance Equipment used: Rolling walker (2 wheeled);None Transfers: Sit to/from Stand Sit to Stand: Min assist;Min guard         General transfer comment: min assist fading to min  guard for safety/balance, cueing for hand placement    Balance Overall balance assessment: Needs assistance Sitting-balance support: No upper extremity supported;Feet supported Sitting balance-Leahy Scale: Fair     Standing balance support: Bilateral upper extremity supported;During functional activity;Single extremity supported Standing balance-Leahy Scale: Poor Standing balance comment: relies on BUE support dynamically, 1 UE support during ADLs                           ADL either performed or assessed with clinical judgement   ADL Overall ADL's : Needs assistance/impaired     Grooming: Min guard;Standing   Upper Body Bathing: Set up;Sitting   Lower Body Bathing: Minimal assistance;Sit to/from stand   Upper Body Dressing : Set up;Sitting   Lower Body Dressing: Minimal assistance;Sit to/from stand   Toilet Transfer: Minimal assistance;Ambulation;RW Toilet Transfer Details (indicate cue type and reason): simulated in room         Functional mobility during ADLs: Minimal assistance;Rolling walker General ADL Comments: pt limited by weakness, decreased activity tolerance, endurance and mild dizziness     Vision   Vision Assessment?: No apparent visual deficits     Perception     Praxis      Pertinent Vitals/Pain Pain Assessment: No/denies pain     Hand Dominance Right   Extremity/Trunk Assessment Upper Extremity Assessment Upper Extremity Assessment: Generalized weakness   Lower Extremity Assessment Lower Extremity Assessment: Defer to PT evaluation   Cervical / Trunk Assessment Cervical / Trunk Assessment: Normal   Communication Communication Communication: HOH   Cognition Arousal/Alertness: Awake/alert Behavior During Therapy: Flat affect Overall Cognitive Status:  Within Functional Limits for tasks assessed                                     General Comments  VSS, fatigues easily    Exercises     Shoulder  Instructions      Home Living Family/patient expects to be discharged to:: Private residence Living Arrangements: Alone Available Help at Discharge: Family;Available PRN/intermittently Type of Home: House Home Access: Stairs to enter [employer] of Steps: 2 Entrance Stairs-Rails: Left Home Layout: One level     Bathroom Shower/Tub: Occupational psychologist: Standard Bathroom Accessibility: Yes   Home Equipment: Environmental consultant - 2 wheels;Cane - single point;Bedside commode;Shower seat;Grab bars - tub/shower   Additional Comments: daughter works, other daughter who is available spouse is having shoulder surgery tuesday      Prior Functioning/Environment Level of Independence: Independent with assistive device(s)        Comments: does not use a device inside; uses RW if she goes out (keeps one at front and back door); independent ADLs, light iADLs (granddaughter cleans)- recently not driving        OT Problem List: Decreased strength;Decreased activity tolerance;Impaired balance (sitting and/or standing);Decreased knowledge of use of DME or AE;Decreased knowledge of precautions      OT Treatment/Interventions: Self-care/ADL training;Therapeutic exercise;DME and/or AE instruction;Therapeutic activities;Patient/family education;Balance training;Energy conservation    OT Goals(Current goals can be found in the care plan section) Acute Rehab OT Goals Patient Stated Goal: feel steady again OT Goal Formulation: With patient Time For Goal Achievement: 07/01/20 Potential to Achieve Goals: Good  OT Frequency: Min 2X/week   Barriers to D/C:            Co-evaluation              AM-PAC OT "6 Clicks" Daily Activity     Outcome Measure Help from another person eating meals?: None Help from another person taking care of personal grooming?: A Little Help from another person toileting, which includes using toliet, bedpan, or urinal?: A Little Help from another  person bathing (including washing, rinsing, drying)?: A Little Help from another person to put on and taking off regular upper body clothing?: A Little Help from another person to put on and taking off regular lower body clothing?: A Little 6 Click Score: 19   End of Session Equipment Utilized During Treatment: Rolling walker Nurse Communication: Mobility status  Activity Tolerance: Patient tolerated treatment well Patient left: in bed;with call bell/phone within reach;with bed alarm set  OT Visit Diagnosis: Other abnormalities of gait and mobility (R26.89);Muscle weakness (generalized) (M62.81);Dizziness and giddiness (R42)                Time: 5956-3875 OT Time Calculation (min): 18 min Charges:  OT General Charges $OT Visit: 1 Visit OT Evaluation $OT Eval Moderate Complexity: 1 Mod  Jolaine Artist, OT Acute Rehabilitation Services Pager [phone] Office [phone]   Delight Stare 06/17/2020, 2:03 PM

## 2020-06-17 NOTE — Progress Notes (Signed)
  Echocardiogram 2D Echocardiogram has been performed.  Gloria [name] 06/17/2020, 11:47 AM

## 2020-06-17 NOTE — Progress Notes (Signed)
ANTICOAGULATION CONSULT NOTE - Follow Up Consult  Pharmacy Consult for Warfarin Indication: atrial fibrillation  Allergies  Allergen Reactions   Amlodipine Swelling   Cephalexin Other (See Comments)    unknown   Codeine Other (See Comments)    unknown   Metoclopramide Hcl Other (See Comments)    unknown   Nitrofuran Derivatives Other (See Comments)    unknown    Patient Measurements: Height: 5\' 6"  (167.6 cm) Weight: 65.8 kg (145 lb) IBW/kg (Calculated) : 59.3 Heparin Dosing Weight: 65.8 kg  Vital Signs: Temp: 98.5 F (36.9 C) (06/11 1154) Temp Source: Oral (06/11 1154) BP: 148/77 (06/11 1154) Pulse Rate: 61 (06/11 1154)  Labs: Recent Labs    06/16/20 1004 06/16/20 1204 06/17/20 0146  HGB 13.3  --  13.3  HCT 41.1  --  40.6  PLT 223  --  225  LABPROT  --  30.7* 21.9*  INR  --  3.0* 1.9*  CREATININE  --  0.61 0.72  TROPONINIHS  --  11  --     Estimated Creatinine Clearance: 45.5 mL/min (by C-G formula based on SCr of 0.72 mg/dL).   Assessment: 87 year old female who presented with dizziness. On warfarin at home for history of atrial fibrillation. Pharmacy consulted to dose while inpatient.   INR on presentation was therapeutic at 3. CBC is normal.  Home warfarin regimen: 3mg  on Mondays and Fridays, 6 on all other days.   Goal of Therapy:  INR 2-3 Monitor platelets by anticoagulation protocol: Yes   Plan:  Warfarin 6 mg po x 1 per home regimen.  Daily PT/ INR and monitor for signs and symptoms of bleeding If remains stable, consider resuming home regimen and MWF INR checks  Sloan Leiter, PharmD, BCPS, BCCCP Clinical Pharmacist Please refer to [facility] for [place] numbers 06/17/2020,2:05 PM

## 2020-06-17 NOTE — Progress Notes (Signed)
PROGRESS NOTE    Gloria [name]  IHK:[id number] DOB: April 12, 1930 DOA: 06/16/2020 PCP: Leanna Battles, MD (Confirm with patient/family/NH records and if not entered, this HAS to be entered at [facility] point of entry. "No PCP" if truly none.)   Chief Complaint  Patient presents with   Dizziness    Brief Narrative:  Patient 87 year old female history of hypertension, hyperlipidemia, paroxysmal A. fib on Coumadin, hep C, GERD presented with complaints of dizziness.  MRI brain, CT angiogram head and neck done negative for any acute abnormalities to explain patient's dizziness.  Patient also noted with a hypertensive urgency.  Patient seen by PT for vestibular evaluation and patient noted to have an atypical BPPV.  Patient also noted to be dehydrated on examination and placed on IV fluids.   Assessment & Plan:   Principal Problem:   Dizziness Active Problems:   BPPV (benign paroxysmal positional vertigo), left   PAF (paroxysmal atrial fibrillation) (HCC)   Hypokalemia   Warfarin anticoagulation   SOB (shortness of breath)   Chest pain   Long term (current) use of anticoagulants   Sinus bradycardia   History of COVID-19   Hypertensive urgency   Dehydration   Bradycardia   1 acute dizziness secondary to atypical BPPV -Patient had presented complaints of dizziness upon waking up in the morning noted on presentation to have a hypertensive urgency. -Patient noted to have tested positive for COVID-19 on memorial day and also patient noted to have decreased oral intake. -MRI brain done negative for any acute abnormalities or subacute CVA. -CT angiogram head and neck done negative for any acute abnormalities. -Patient assessed by PT for vestibular evaluation and noted to have a positive left head roll for left horizontal canal BPPV and patient treated with Appiani maneuver per PT with clinical improvement.. -Placed on gentle hydration. -HCTZ discontinued. -We will need outpatient versus  home PT for vestibular.  2.  Dehydration -HCTZ discontinued. -Gentle hydration with IV fluids.  3.  Hypertensive urgency -Patient noted on presentation to have blood pressure of 209/76. -Patient noted to be on HCTZ, losartan, flecainide. -Flecainide held due to bouts of bradycardia. -HCTZ held secondary to dehydration. -Continue losartan. -Place on hydralazine 25 mg p.o. twice daily. -IV hydralazine as needed. -Follow.  4.  Chest pain/epigastric pain -Currently chest pain-free. -High-sensitivity troponin negative. -EKG without any significant ischemic changes. -2D echo 60 to 75%,IEPP, grade 2 diastolic dysfunction, normal right ventricular systolic function, severely dilated left atrial size, normal mitral valve. -Continue PPI IV BID.  -GI cocktail as needed.  5.  Shortness of breath -Patient reported shortness of breath. -Daughter states patient has been short of breath with activity for over about 8 months. -Chest x-ray with no acute abnormalities. -Low suspicion for PE as patient with therapeutic INR on admission of 3. -Slight elevation of BNP however patient clinically dehydrated. -2D echo negative for aortic stenosis or significant valvular disease. -Patient with extensive tobacco history concern for possible COPD. -Placed on Xopenex and Atrovent nebs scheduled, Dulera. -We will need outpatient follow-up with pulmonary for PFTs and further evaluation.  6.  Sinus bradycardia -Patient noted on admission to have heart rates in the 40s. -It is noted that during last evaluation patient's cardiologist Dr. Martinique heart rates were in the 23s. -Admitting physician Dr. Tamala Julian discussed case with Dr.Nahser who got in contact with patient's primary cardiologist Dr. Martinique and he was noted to be okay to hold patient's flecainide for now and monitor on telemetry. -Continue to hold flecainide.  7.  Hypokalemia -Replete.  8.  Paroxysmal atrial fibrillation on chronic  anticoagulation -Patient in normal sinus rhythm. -Flecainide on hold due to bradycardia. -INR on admission was therapeutic at 3. -INR today at 1.9. -Coumadin per pharmacy.  9.  Hyperlipidemia -Continue statin.  10.  Gastroesophageal reflux disease -PPI IV every 12 hours. -GI cocktail as needed.  11.  Recent history of COVID-19 infection -Per admitting physician patient noted to have a COVID-19 infection around [facility]/06/05/2020. -Screening COVID-19 PCR negative..   DVT prophylaxis: Coumadin Code Status: Full Family Communication: Updated daughter via telephone. Disposition:   Status is: Inpatient  The patient will require care spanning > 2 midnights and should be moved to inpatient because: IV treatments appropriate due to intensity of illness or inability to take PO  Dispo: The patient is from: Home              Anticipated d/c is to: Home              Patient currently is not medically stable to d/c.   Difficult to place patient No       Consultants:  None  Procedures:  2D echo 06/17/2020 CT angiogram head and neck 06/16/2020 Chest x-ray 06/16/2020 MRI brain 06/16/2020   Antimicrobials:  None   Subjective: Patient sleeping. Patient states cramping improving. No CP. No sob at rest.  Overall feeling better than she did on admission.  No significant dizziness after being assessed by PT for vestibular evaluation.  Objective: Vitals:   06/16/20 2327 06/17/20 0340 06/17/20 1154 06/17/20 1753  BP: (!) 158/64 (!) 147/70 (!) 148/77 (!) 170/67  Pulse: (!) 54 62 61 (!) 56  Resp: 16 16 16 16   Temp: 98.3 F (36.8 C) 98.1 F (36.7 C) 98.5 F (36.9 C) 98.5 F (36.9 C)  TempSrc: Oral Oral Oral Oral  SpO2: 96% 94% 97% 96%  Weight:      Height:        Intake/Output Summary (Last 24 hours) at 06/17/2020 1823 Last data filed at 06/17/2020 1753 Gross per 24 hour  Intake 1167.7 ml  Output --  Net 1167.7 ml   Filed Weights   06/16/20 0953  Weight: 65.8 kg     Examination:  General exam: Appears calm and comfortable.  Dry mucous membranes. Respiratory system: Clear to auscultation.  No wheezes, no crackles, no rhonchi.  Fair air movement.  Speaking in full sentences.  Cardiovascular system: S1 & S2 heard, RRR. No JVD, murmurs, rubs, gallops or clicks. No pedal edema. Gastrointestinal system: Abdomen is nondistended, soft and nontender. No organomegaly or masses felt. Normal bowel sounds heard. Central nervous system: Alert and oriented. No focal neurological deficits. Extremities: Symmetric 5 x 5 power. Skin: No rashes, lesions or ulcers Psychiatry: Judgement and insight appear normal. Mood & affect appropriate.     Data Reviewed: I have personally reviewed following labs and imaging studies  CBC: Recent Labs  Lab 06/16/20 1004 06/17/20 0146  WBC 6.5 5.0  NEUTROABS 4.7  --   HGB 13.3 13.3  HCT 41.1 40.6  MCV 90.9 91.4  PLT 223 676    Basic Metabolic Panel: Recent Labs  Lab 06/16/20 1204 06/16/20 1747 06/17/20 0146  NA 138  --  136  K 3.4*  --  4.3  CL 108  --  104  CO2 22  --  23  GLUCOSE 82  --  84  BUN 13  --  12  CREATININE 0.61  --  0.72  CALCIUM 7.9*  --  9.4  MG  --  2.1  --     GFR: Estimated Creatinine Clearance: 45.5 mL/min (by C-G formula based on SCr of 0.72 mg/dL).  Liver Function Tests: Recent Labs  Lab 06/16/20 1204  AST 22  ALT 14  ALKPHOS 87  BILITOT 1.0  PROT 5.0*  ALBUMIN 3.0*    CBG: No results for input(s): GLUCAP in the last 168 hours.   Recent Results (from the past 240 hour(s))  SARS CORONAVIRUS 2 (TAT 6-24 HRS) Nasopharyngeal Nasopharyngeal Swab     Status: None   Collection Time: 06/16/20  5:37 PM   Specimen: Nasopharyngeal Swab  Result Value Ref Range Status   SARS Coronavirus 2 NEGATIVE NEGATIVE Final    Comment: (NOTE) SARS-CoV-2 target nucleic acids are NOT DETECTED.  The SARS-CoV-2 RNA is generally detectable in upper and lower respiratory specimens during the  acute phase of infection. Negative results do not preclude SARS-CoV-2 infection, do not rule out co-infections with other pathogens, and should not be used as the sole basis for treatment or other patient management decisions. Negative results must be combined with clinical observations, patient history, and epidemiological information. The expected result is Negative.  Fact Sheet for Patients: SugarRoll.be  Fact Sheet for Healthcare Providers: https://www.woods-mathews.com/  This test is not yet approved or cleared by the [place] FDA and  has been authorized for detection and/or diagnosis of SARS-CoV-2 by FDA under an Emergency Use Authorization (EUA). This EUA will remain  in effect (meaning this test can be used) for the duration of the COVID-19 declaration under Se ction 564(b)(1) of the Act, 21 U.S.C. section 360bbb-3(b)(1), unless the authorization is terminated or revoked sooner.  Performed at [facility], [place] [address] [place], [place] [address]          Radiology Studies: CT Angio Head W or Wo Contrast  Result Date: 06/16/2020 CLINICAL DATA:  Vertigo EXAM: CT ANGIOGRAPHY HEAD TECHNIQUE: Multidetector CT imaging of the head was performed using the standard protocol during bolus administration of intravenous contrast. Multiplanar CT image reconstructions and MIPs were obtained to evaluate the vascular anatomy. CONTRAST:  34mL OMNIPAQUE IOHEXOL 350 MG/ML SOLN COMPARISON:  2018 MRI head and MRA head FINDINGS: CT HEAD Brain: There is no acute intracranial hemorrhage or edema. Gray-white differentiation is preserved. There is no extra-axial fluid collection. Heavily calcified meningioma along the posterior right cerebral convexity abutting the superior sagittal sinus is not substantially changed. Prominence of the ventricles and sulci reflects mild generalized parenchymal volume loss. Patchy hypoattenuation in the  supratentorial white matter is nonspecific but may reflect moderate chronic microvascular ischemic changes. Chronic small vessel infarcts of the right basal ganglia. Vascular: Better evaluated on CTA portion.  No hyperdense vessel. Skull: Unremarkable. Sinuses/Orbits: No acute finding. Other: None. Review of the MIP images confirms the above findings CTA NECK Aortic arch: Mixed plaque along the visualized arch and at the patent great vessel origins. Right carotid system: Patent. Primarily calcified plaque at the common carotid bifurcation and proximal internal carotid with less than 50% stenosis. Left carotid system: Patent. Calcified plaque at the common carotid bifurcation and proximal internal carotid causing less than 50% stenosis. Vertebral arteries: Patent. Right vertebral artery dominant. No significant stenosis or evidence of dissection. Skeleton: Degenerative changes of the included spine. Other neck: Unremarkable. Upper chest: No apical lung mass. Review of the MIP images confirms the above findings CTA HEAD Anterior circulation: Intracranial internal carotid arteries are patent with calcified  plaque but no significant stenosis. Middle cerebral arteries are patent. Posterior circulation: Intracranial vertebral arteries are patent with minimal calcified plaque on the left. Basilar artery is patent. Major cerebellar artery origins are patent. Posterior cerebral arteries are patent. Venous sinuses: Patent as allowed by contrast bolus timing. Review of the MIP images confirms the above findings IMPRESSION: No acute intracranial abnormality. Chronic microvascular ischemic changes. Chronic right basal ganglia infarcts. Similar small heavily calcified meningioma. No large vessel occlusion or evidence of dissection. Plaque at the ICA origins causes less than 50% stenosis. Electronically Signed   By: Macy Mis M.D.   On: 06/16/2020 14:48   CT Angio Neck W and/or Wo Contrast  Result Date:  06/16/2020 CLINICAL DATA:  Vertigo EXAM: CT ANGIOGRAPHY HEAD TECHNIQUE: Multidetector CT imaging of the head was performed using the standard protocol during bolus administration of intravenous contrast. Multiplanar CT image reconstructions and MIPs were obtained to evaluate the vascular anatomy. CONTRAST:  54mL OMNIPAQUE IOHEXOL 350 MG/ML SOLN COMPARISON:  2018 MRI head and MRA head FINDINGS: CT HEAD Brain: There is no acute intracranial hemorrhage or edema. Gray-white differentiation is preserved. There is no extra-axial fluid collection. Heavily calcified meningioma along the posterior right cerebral convexity abutting the superior sagittal sinus is not substantially changed. Prominence of the ventricles and sulci reflects mild generalized parenchymal volume loss. Patchy hypoattenuation in the supratentorial white matter is nonspecific but may reflect moderate chronic microvascular ischemic changes. Chronic small vessel infarcts of the right basal ganglia. Vascular: Better evaluated on CTA portion.  No hyperdense vessel. Skull: Unremarkable. Sinuses/Orbits: No acute finding. Other: None. Review of the MIP images confirms the above findings CTA NECK Aortic arch: Mixed plaque along the visualized arch and at the patent great vessel origins. Right carotid system: Patent. Primarily calcified plaque at the common carotid bifurcation and proximal internal carotid with less than 50% stenosis. Left carotid system: Patent. Calcified plaque at the common carotid bifurcation and proximal internal carotid causing less than 50% stenosis. Vertebral arteries: Patent. Right vertebral artery dominant. No significant stenosis or evidence of dissection. Skeleton: Degenerative changes of the included spine. Other neck: Unremarkable. Upper chest: No apical lung mass. Review of the MIP images confirms the above findings CTA HEAD Anterior circulation: Intracranial internal carotid arteries are patent with calcified plaque but no  significant stenosis. Middle cerebral arteries are patent. Posterior circulation: Intracranial vertebral arteries are patent with minimal calcified plaque on the left. Basilar artery is patent. Major cerebellar artery origins are patent. Posterior cerebral arteries are patent. Venous sinuses: Patent as allowed by contrast bolus timing. Review of the MIP images confirms the above findings IMPRESSION: No acute intracranial abnormality. Chronic microvascular ischemic changes. Chronic right basal ganglia infarcts. Similar small heavily calcified meningioma. No large vessel occlusion or evidence of dissection. Plaque at the ICA origins causes less than 50% stenosis. Electronically Signed   By: Macy Mis M.D.   On: 06/16/2020 14:48   MR BRAIN WO CONTRAST  Result Date: 06/16/2020 CLINICAL DATA:  Initial evaluation for acute dizziness. EXAM: MRI HEAD WITHOUT CONTRAST TECHNIQUE: Multiplanar, multiecho pulse sequences of the brain and surrounding structures were obtained without intravenous contrast. COMPARISON:  Prior CTA from earlier the same day as well as previous MRI from 07/14/2016 FINDINGS: Brain: Mild diffuse prominence of the CSF containing spaces compatible generalized cerebral atrophy. Patchy and confluent T2/FLAIR hyperintensity within the periventricular and deep white matter both cerebral hemispheres most consistent with chronic small vessel ischemic disease, mild to moderate in nature, and mildly  progressed as compared to 2018. Superimposed remote lacunar infarct at the right caudate and lentiform nuclei. No abnormal foci of restricted diffusion to suggest acute or subacute ischemia. Gray-white matter differentiation maintained. No encephalomalacia to suggest chronic cortical infarction. No foci of susceptibility artifact to suggest acute or chronic intracranial hemorrhage. 1.4 cm calcified meningioma at the right occipital convexity without associated mass effect, relatively stable from previous  exams. No other mass lesion, mass effect or midline shift. No hydrocephalus or extra-axial fluid collection. Pituitary gland and suprasellar region within normal limits. Midline structures intact. Vascular: Major intracranial vascular flow voids are maintained. Skull and upper cervical spine: Craniocervical junction within normal limits. Bone marrow signal intensity normal. No scalp soft tissue abnormality. Sinuses/Orbits: Patient status post bilateral ocular lens replacement. Globes and orbital soft tissues demonstrate no other acute finding. Mild scattered mucosal thickening noted within the ethmoidal air cells and maxillary sinuses. Paranasal sinuses are otherwise clear. Small right mastoid effusion noted. Visualized nasopharynx within normal limits. Other: None. IMPRESSION: 1. No acute intracranial abnormality. 2. Generalized age-related cerebral atrophy with mild-to-moderate chronic small vessel ischemic disease, mildly progressed as compared to 2018. 3. 1.4 cm calcified meningioma at the right occipital convexity without associated mass effect, stable from previous. Electronically Signed   By: Jeannine Boga M.D.   On: 06/16/2020 22:24   DG Chest Portable 1 View  Result Date: 06/16/2020 CLINICAL DATA:  Chest pain EXAM: PORTABLE CHEST 1 VIEW COMPARISON:  CT chest dated 12/12/2017 FINDINGS: Mild left basilar scarring. No focal consolidation. No pleural effusion or pneumothorax. The heart is normal in size.  Thoracic aortic atherosclerosis. IMPRESSION: No evidence of acute cardiopulmonary disease. Electronically Signed   By: Julian Hy M.D.   On: 06/16/2020 10:48   ECHOCARDIOGRAM COMPLETE  Result Date: 06/17/2020    ECHOCARDIOGRAM REPORT   Patient Name:   Gloria [name] Date of Exam: 06/17/2020 Medical Rec #:  [id number]        Height:       66.0 in Accession #:    [id number]       Weight:       145.0 lb Date of Birth:  01-16-1930        BSA:          1.744 m Patient Age:    84 years          BP:           147/70 mmHg Patient Gender: F                HR:           55 bpm. Exam Location:  Inpatient Procedure: 2D Echo, Cardiac Doppler and Color Doppler Indications:    Chest pain  History:        Patient has prior history of Echocardiogram examinations, most                 recent 06/05/2016. Arrythmias:Atrial Fibrillation; Risk                 Factors:Former Smoker, Hypertension and Dyslipidemia. GERD.  Sonographer:    Clayton Lefort RDCS (AE) Referring Phys: [place]  Sonographer Comments: Suboptimal parasternal window. IMPRESSIONS  1. Left ventricular ejection fraction, by estimation, is 60 to 65%. The left ventricle has normal function. The left ventricle has no regional wall motion abnormalities. There is mild concentric left ventricular hypertrophy. Left ventricular diastolic parameters are consistent with Grade II diastolic dysfunction (pseudonormalization). Elevated left ventricular end-diastolic  pressure.  2. Right ventricular systolic function is normal. The right ventricular size is normal. Tricuspid regurgitation signal is inadequate for assessing PA pressure.  3. Left atrial size was severely dilated.  4. The mitral valve is normal in structure. Trivial mitral valve regurgitation. No evidence of mitral stenosis.  5. The aortic valve is tricuspid. Aortic valve regurgitation is not visualized. Mild to moderate aortic valve sclerosis/calcification is present, without any evidence of aortic stenosis. Aortic valve area, by VTI measures 1.82 cm. Aortic valve mean gradient measures 9.0 mmHg. Aortic valve Vmax measures 2.08 m/s.  6. Aortic dilatation noted. There is mild dilatation of the aortic root, measuring 38 mm.  7. The inferior vena cava is normal in size with greater than 50% respiratory variability, suggesting right atrial pressure of 3 mmHg. FINDINGS  Left Ventricle: Left ventricular ejection fraction, by estimation, is 60 to 65%. The left ventricle has normal function. The left  ventricle has no regional wall motion abnormalities. The left ventricular internal cavity size was normal in size. There is  mild concentric left ventricular hypertrophy. Left ventricular diastolic parameters are consistent with Grade II diastolic dysfunction (pseudonormalization). Elevated left ventricular end-diastolic pressure. Right Ventricle: The right ventricular size is normal. No increase in right ventricular wall thickness. Right ventricular systolic function is normal. Tricuspid regurgitation signal is inadequate for assessing PA pressure. Left Atrium: Left atrial size was severely dilated. Right Atrium: Right atrial size was normal in size. Pericardium: There is no evidence of pericardial effusion. Mitral Valve: The mitral valve is normal in structure. Mild mitral annular calcification. Trivial mitral valve regurgitation. No evidence of mitral valve stenosis. MV peak gradient, 6.1 mmHg. The mean mitral valve gradient is 2.0 mmHg. Tricuspid Valve: The tricuspid valve is normal in structure. Tricuspid valve regurgitation is not demonstrated. No evidence of tricuspid stenosis. Aortic Valve: The aortic valve is tricuspid. Aortic valve regurgitation is not visualized. Mild to moderate aortic valve sclerosis/calcification is present, without any evidence of aortic stenosis. Aortic valve mean gradient measures 9.0 mmHg. Aortic valve peak gradient measures 17.3 mmHg. Aortic valve area, by VTI measures 1.82 cm. Pulmonic Valve: The pulmonic valve was normal in structure. Pulmonic valve regurgitation is not visualized. No evidence of pulmonic stenosis. Aorta: Aortic dilatation noted. There is mild dilatation of the aortic root, measuring 38 mm. Venous: The inferior vena cava is normal in size with greater than 50% respiratory variability, suggesting right atrial pressure of 3 mmHg. IAS/Shunts: No atrial level shunt detected by color flow Doppler.  LEFT VENTRICLE PLAX 2D LVIDd:         4.90 cm  Diastology LVIDs:          3.50 cm  LV e' medial:    4.57 cm/s LV PW:         1.20 cm  LV E/e' medial:  17.9 LV IVS:        1.10 cm  LV e' lateral:   5.77 cm/s LVOT diam:     1.90 cm  LV E/e' lateral: 14.2 LV SV:         86 LV SV Index:   49 LVOT Area:     2.84 cm  RIGHT VENTRICLE             IVC RV Basal diam:  2.70 cm     IVC diam: 1.60 cm RV S prime:     13.20 cm/s TAPSE (M-mode): 2.6 cm LEFT ATRIUM  Index       RIGHT ATRIUM           Index LA diam:        3.40 cm 1.95 cm/m  RA Area:     11.00 cm LA Vol (A2C):   72.6 ml 41.62 ml/m RA Volume:   21.40 ml  12.27 ml/m LA Vol (A4C):   81.7 ml 46.84 ml/m LA Biplane Vol: 78.9 ml 45.23 ml/m  AORTIC VALVE AV Area (Vmax):    1.99 cm AV Area (Vmean):   1.63 cm AV Area (VTI):     1.82 cm AV Vmax:           207.67 cm/s AV Vmean:          141.667 cm/s AV VTI:            0.472 m AV Peak Grad:      17.3 mmHg AV Mean Grad:      9.0 mmHg LVOT Vmax:         146.00 cm/s LVOT Vmean:        81.600 cm/s LVOT VTI:          0.304 m LVOT/AV VTI ratio: 0.64  AORTA Ao Root diam: 3.80 cm MITRAL VALVE MV Area (PHT): 2.69 cm     SHUNTS MV Area VTI:   2.11 cm     Systemic VTI:  0.30 m MV Peak grad:  6.1 mmHg     Systemic Diam: 1.90 cm MV Mean grad:  2.0 mmHg MV Vmax:       1.23 m/s MV Vmean:      54.6 cm/s MV Decel Time: 282 msec MV E velocity: 81.80 cm/s MV A velocity: 103.00 cm/s MV E/A ratio:  0.79 Fransico Him MD Electronically signed by Fransico Him MD Signature Date/Time: 06/17/2020/11:50:58 AM    Final         Scheduled Meds:  atorvastatin  10 mg Oral QHS   hydrALAZINE  25 mg Oral BID   ipratropium  0.5 mg Nebulization TID   levalbuterol  0.63 mg Nebulization TID   losartan  25 mg Oral QHS   mometasone-formoterol  2 puff Inhalation BID   pantoprazole (PROTONIX) IV  40 mg Intravenous Q12H   sodium chloride flush  3 mL Intravenous Q12H   Warfarin - Pharmacist Dosing Inpatient   Does not apply q1600   Continuous Infusions:  sodium chloride 75 mL/hr at 06/17/20 1327      LOS: 0 days    Time spent: 35 minutes    Irine Seal, MD [facility]   To contact the attending provider between 7A-7P or the covering provider during after hours 7P-7A, please log into the web site [email or web address] and access using universal [place] password for that web site. If you do not have the password, please call the hospital operator.  06/17/2020, 6:23 PM

## 2020-06-17 NOTE — Care Management Obs Status (Signed)
[place] NOTIFICATION   Patient Details  Name: Gloria [name] MRN: [id number] Date of Birth: 22-Jun-1930   Medicare Observation Status Notification Given:  Yes    Carles Collet, RN 06/17/2020, 4:09 PM

## 2020-06-17 NOTE — TOC Transition Note (Signed)
Transition of Care [facility]) - CM/SW Discharge Note   Patient Details  Name: Gloria [name] MRN: [id number] Date of Birth: 11/03/1930  Transition of Care [facility]) CM/SW Contact:  Carles Collet, RN Phone Number: 06/17/2020, 4:06 PM   Clinical Narrative:   Damaris Schooner w patient over the phone. She states she will plan on DC to home. Is agreeable to [facility], no preference for [facility] agency, has used [facility] in the past. Referral for vestibular PT placed to [place].     Final next level of care: [place]     Patient Goals and CMS Choice Patient states their goals for this hospitalization and ongoing recovery are:: to go home   Choice offered to / list presented to : Patient  Discharge Placement                       Discharge Plan and Services                          HH Arranged: PT, OT [facility] Agency: [facility] Date [place]: 06/17/20 Time [place]: [place] Representative spoke with at [place]: [place] ([place]) Interventions     Readmission Risk Interventions No flowsheet data found.

## 2020-06-17 NOTE — Progress Notes (Signed)
Physical Therapy Treatment Patient Details Name: Gloria [name] MRN: [id number] DOB: 1930/05/28 Today's Date: 06/17/2020    History of Present Illness 87 y.o. female presented with complaints of dizziness. Began when woke up and tried to get OOB. 5/26 +COVID 19; CT head and cervical spine no acute changes; BP 209/76 HR 40s; + chest pain with EKG normal and high sensitivity troponin negative;   PMH- significant of hypertension, hyperlipidemia, paroxysmal atrial fibrillation on Coumadin, hepatitis C, and GERD    PT Comments    On return visit, pt denied dizziness or vertigo throughout session (including with left horizontal head roll test). She was more easily fatigued with walking with mild dyspnea this session compared to earlier today. She again states that her daughter will stay with her 24/7 if she is discharged.    Follow Up Recommendations  Home health PT;Supervision for mobility/OOB (per pt, daughter can stay with her; HHPT for vestibular rehab)     Equipment Recommendations  None recommended by PT    Recommendations for Other Services       Precautions / Restrictions Precautions Precautions: Fall Restrictions Weight Bearing Restrictions: No    Mobility  Bed Mobility Overal bed mobility: Needs Assistance Bed Mobility: Supine to Sit;Sit to Supine Rolling: Independent   Supine to sit: Supervision Sit to supine: Supervision Sit to sidelying: Modified independent (Device/Increase time) General bed mobility comments: for safety, line mgmt    Transfers Overall transfer level: Needs assistance Equipment used: Rolling walker (2 wheeled);None Transfers: Sit to/from Stand Sit to Stand: Min guard         General transfer comment: cueing for hand placement  Ambulation/Gait Ambulation/Gait assistance: Min guard Gait Distance (Feet): 100 Feet Assistive device: Rolling walker (2 wheeled);None Gait Pattern/deviations: Step-through pattern;Decreased stride  length;Trendelenburg;Wide base of support Gait velocity: decr   General Gait Details: prior to reassessing horiz canal and reported no dizziness throughout mobility   Stairs             Wheelchair Mobility    Modified Rankin (Stroke Patients Only)       Balance Overall balance assessment: Needs assistance Sitting-balance support: No upper extremity supported;Feet supported Sitting balance-Leahy Scale: Fair     Standing balance support: Bilateral upper extremity supported;During functional activity;Single extremity supported Standing balance-Leahy Scale: Poor Standing balance comment: relies on BUE support dynamically, 1 UE support during ADLs                            Cognition Arousal/Alertness: Awake/alert Behavior During Therapy: Flat affect Overall Cognitive Status: Within Functional Limits for tasks assessed                                        Exercises      General Comments General comments (skin integrity, edema, etc.): reassessed left horizontal canal with no vertigo elicited      Pertinent Vitals/Pain Pain Assessment: No/denies pain    Home Living Family/patient expects to be discharged to:: Private residence Living Arrangements: Alone Available Help at Discharge: Family;Available PRN/intermittently Type of Home: House Home Access: Stairs to enter Entrance Stairs-Rails: Left Home Layout: One level Home Equipment: Walker - 2 wheels;Cane - single point;Bedside commode;Shower seat;Grab bars - tub/shower Additional Comments: daughter works, other daughter who is available spouse is having shoulder surgery tuesday    Prior Function Level of Independence: Independent with  assistive device(s)      Comments: does not use a device inside; uses RW if she goes out (keeps one at front and back door); independent ADLs, light iADLs (granddaughter cleans)- recently not driving   PT Goals (current goals can now be found in the  care plan section) Acute Rehab PT Goals Patient Stated Goal: feel steady again Time For Goal Achievement: 07/01/20 Potential to Achieve Goals: Good Progress towards PT goals: Progressing toward goals    Frequency    Min 3X/week      PT Plan Current plan remains appropriate    Co-evaluation              AM-PAC PT "6 Clicks" Mobility   Outcome Measure  Help needed turning from your back to your side while in a flat bed without using bedrails?: None Help needed moving from lying on your back to sitting on the side of a flat bed without using bedrails?: A Little Help needed moving to and from a bed to a chair (including a wheelchair)?: A Little Help needed standing up from a chair using your arms (e.g., wheelchair or bedside chair)?: A Little Help needed to walk in hospital room?: A Little Help needed climbing 3-5 steps with a railing? : A Little 6 Click Score: 19    End of Session Equipment Utilized During Treatment: Gait belt Activity Tolerance: Patient tolerated treatment well Patient left: in bed;with call bell/phone within reach;with bed alarm set Nurse Communication: Mobility status PT Visit Diagnosis: Unsteadiness on feet (R26.81);Dizziness and giddiness (R42)     Time: 5916-3846 PT Time Calculation (min) (ACUTE ONLY): 21 min  Charges:  $Gait Training: 8-22 mins                      Arby Barrette, PT Pager [phone]    Gloria [name] 06/17/2020, 3:37 PM

## 2020-06-17 NOTE — Evaluation (Signed)
Physical Therapy Evaluation Patient Details Name: Gloria [name] MRN: [id number] DOB: 21-Dec-1930 Today's Date: 06/17/2020   History of Present Illness  87 y.o. female presented with complaints of dizziness. Began when woke up and tried to get OOB. 5/26 +COVID 19; CT head and cervical spine no acute changes; BP 209/76 HR 40s; + chest pain with EKG normal and high sensitivity troponin negative;   PMH- significant of hypertension, hyperlipidemia, paroxysmal atrial fibrillation on Coumadin, hepatitis C, and GERD  Clinical Impression   Pt admitted secondary to problem above with deficits below. PTA patient was independent with ambulation inside home and basic ADLs (assist with housework and recently lost her drivers license due to  macular degeneration). She uses a RW in the community and daughter has assisted with community needs.  Pt currently requires min to minguard assist with all mobility, including with use of RW. She had a positive left head roll for left horizontal canal BPPV. Treatment with Appiani maneuver was completed and on reassessment of left head roll, she had no more sense of movement. Will return to see in ~2 hours to further assess her balance after her vestibular system "settles down." Will continue to follow acutely to maximize functional mobility independence and safety.       Follow Up Recommendations Home health PT;Supervision for mobility/OOB (per pt, daughter can stay with her; HHPT for vestibular rehab)    Equipment Recommendations  None recommended by PT    Recommendations for Other Services       Precautions / Restrictions Precautions Precautions: Fall Restrictions Weight Bearing Restrictions: No     06/17/20 0001  Vestibular Assessment  General Observation supine in bed, denies dizziness this morning when up to bathroom  Symptom Behavior  Subjective history of current problem When sat up on EOB yesterday morning she felt room was spinning. Lasted <1 minute   Type of Dizziness  Spinning  Frequency of Dizziness Several times yesterday  Duration of Dizziness <1 minute  Symptom Nature Motion provoked;Positional  Aggravating Factors Supine to sit  Relieving Factors Rest  Progression of Symptoms Better  History of similar episodes no  Oculomotor Exam  Oculomotor Alignment Normal  Ocular ROM normal  Spontaneous Absent  Gaze-induced  Absent  Smooth Pursuits Intact  Positional Testing  Dix-Hallpike Dix-Hallpike Right;Dix-Hallpike Left  Horizontal Canal Testing Horizontal Canal Right;Horizontal Canal Left;Horizontal Canal Left Intensity  Dix-Hallpike Right  Dix-Hallpike Right Duration 0  Dix-Hallpike Right Symptoms No nystagmus  Dix-Hallpike Left  Dix-Hallpike Left Duration 0  Dix-Hallpike Left Symptoms No nystagmus  Horizontal Canal Right  Horizontal Canal Right Duration 0  Horizontal Canal Right Symptoms Normal  Horizontal Canal Left  Horizontal Canal Left Duration 7 seconds  Horizontal Canal Left Symptoms Other (comment) (no visible nystagmus but pt reported seeing things move (could not describe more than this))  Horizontal Canal Left Intensity  Horizontal Canal Left Intensity Mild      Mobility  Bed Mobility Overal bed mobility: Needs Assistance Bed Mobility: Rolling;Sidelying to Sit;Sit to Sidelying Rolling: Independent Sidelying to sit: Supervision     Sit to sidelying: Modified independent (Device/Increase time) General bed mobility comments: supervision to monitor closely for dizziness and nystagmus (none noted)    Transfers Overall transfer level: Needs assistance Equipment used: Rolling walker (2 wheeled);None Transfers: Sit to/from Stand Sit to Stand: Min assist;Min guard         General transfer comment: initial stand with min assist due to posterior lean/loss of balance (toes lifting off floor); secnond and  third transfers without imbalance and minguard for safety  Ambulation/Gait Ambulation/Gait  assistance: Min guard Gait Distance (Feet): 20 Feet (no device the 80 ft with RW) Assistive device: Rolling walker (2 wheeled);None Gait Pattern/deviations: Step-through pattern;Decreased stride length;Trendelenburg;Wide base of support Gait velocity: decr   General Gait Details: pt reports her step width and length are normal for her after bil hip and bil knee replacements; without device pt holding hands in guarded position as if ready to catch herself; much improved with RW  Stairs            Wheelchair Mobility    Modified Rankin (Stroke Patients Only)       Balance Overall balance assessment: Mild deficits observed, not formally tested                                           Pertinent Vitals/Pain Pain Assessment: No/denies pain    Home Living Family/patient expects to be discharged to:: Private residence Living Arrangements: Alone Available Help at Discharge: Family;Available 24 hours/day (daughter can stay with pt as long as needed) Type of Home: House Home Access: Stairs to enter Entrance Stairs-Rails: Left Entrance Stairs-Number of Steps: 2 Home Layout: One level Home Equipment: Walker - 2 wheels;Cane - single point;Bedside commode;Shower seat;Grab bars - tub/shower      Prior Function Level of Independence: Independent with assistive device(s)         Comments: does not use a device inside; uses RW if she goes out (keeps one at front and back door)     Hand Dominance   Dominant Hand: Right    Extremity/Trunk Assessment   Upper Extremity Assessment Upper Extremity Assessment: Generalized weakness    Lower Extremity Assessment Lower Extremity Assessment: Generalized weakness    Cervical / Trunk Assessment Cervical / Trunk Assessment: Normal  Communication   Communication: HOH  Cognition Arousal/Alertness: Awake/alert Behavior During Therapy: WFL for tasks assessed/performed Overall Cognitive Status: Within Functional  Limits for tasks assessed                                        General Comments      Exercises     Assessment/Plan    PT Assessment Patient needs continued PT services  PT Problem List Decreased balance;Decreased mobility       PT Treatment Interventions DME instruction;Gait training;Stair training;Functional mobility training;Therapeutic activities;Therapeutic exercise;Balance training;Neuromuscular re-education;Patient/family education    PT Goals (Current goals can be found in the Care Plan section)  Acute Rehab PT Goals Patient Stated Goal: feel steady again PT Goal Formulation: With patient Time For Goal Achievement: 07/01/20 Potential to Achieve Goals: Good    Frequency Min 3X/week   Barriers to discharge        Co-evaluation               AM-PAC PT "6 Clicks" Mobility  Outcome Measure Help needed turning from your back to your side while in a flat bed without using bedrails?: None Help needed moving from lying on your back to sitting on the side of a flat bed without using bedrails?: A Little Help needed moving to and from a bed to a chair (including a wheelchair)?: A Little Help needed standing up from a chair using your arms (e.g., wheelchair or bedside chair)?:  A Little Help needed to walk in hospital room?: A Little Help needed climbing 3-5 steps with a railing? : A Little 6 Click Score: 19    End of Session Equipment Utilized During Treatment: Gait belt Activity Tolerance: Patient tolerated treatment well Patient left: in bed;with call bell/phone within reach;with bed alarm set Nurse Communication: Mobility status;Other (comment) (PT to return in ~2 hours) PT Visit Diagnosis: Unsteadiness on feet (R26.81);Dizziness and giddiness (R42)    Time: 0822-0907 PT Time Calculation (min) (ACUTE ONLY): 45 min   Charges:   PT Evaluation $PT Eval Moderate Complexity: 1 Mod PT Treatments $Therapeutic Activity: 8-22 mins $Canalith Rep  Proc: 8-22 mins         Arby Barrette, PT Pager [phone]   Rexanne Mano 06/17/2020, 9:29 AM

## 2020-06-18 LAB — BASIC METABOLIC PANEL
Anion gap: 9 (ref 5–15)
BUN: 13 mg/dL (ref 8–23)
CO2: 23 mmol/L (ref 22–32)
Calcium: 9.1 mg/dL (ref 8.9–10.3)
Chloride: 105 mmol/L (ref 98–111)
Creatinine, Ser: 0.76 mg/dL (ref 0.44–1.00)
GFR, Estimated: >60 mL/min (ref >60)
Glucose, Bld: 96 mg/dL (ref 70–99)
Potassium: 3.6 mmol/L (ref 3.5–5.1)
Sodium: 137 mmol/L (ref 135–145)

## 2020-06-18 LAB — CBC
HCT: 38.3 % (ref 36.0–46.0)
Hemoglobin: 12.7 g/dL (ref 12.0–15.0)
MCH: 30 pg (ref 26.0–34.0)
MCHC: 33.2 g/dL (ref 30.0–36.0)
MCV: 90.5 fL (ref 80.0–100.0)
Platelets: 200 10*3/uL (ref 150–400)
RBC: 4.23 MIL/uL (ref 3.87–5.11)
RDW: 14 % (ref 11.5–15.5)
WBC: 5.3 10*3/uL (ref 4.0–10.5)
nRBC: 0 % (ref 0.0–0.2)

## 2020-06-18 LAB — PROTIME-INR
INR: 2 — ABNORMAL HIGH (ref 0.8–1.2)
Prothrombin Time: 22.9 seconds — ABNORMAL HIGH (ref 11.4–15.2)

## 2020-06-18 LAB — MAGNESIUM: Magnesium: 2.1 mg/dL (ref 1.7–2.4)

## 2020-06-18 MED ORDER — ACETAMINOPHEN 325 MG PO TABS: 650.0000 mg | ORAL_TABLET | Freq: Four times a day (QID) | ORAL | Status: AC | PRN

## 2020-06-18 MED ORDER — ALBUTEROL SULFATE HFA 108 (90 BASE) MCG/ACT IN AERS: 2.0000 | INHALATION_SPRAY | Freq: Four times a day (QID) | RESPIRATORY_TRACT | 1 refills | Status: DC | PRN

## 2020-06-18 MED ORDER — MOMETASONE FURO-FORMOTEROL FUM 100-5 MCG/ACT IN AERO: 2.0000 | INHALATION_SPRAY | Freq: Two times a day (BID) | RESPIRATORY_TRACT | 1 refills | Status: DC

## 2020-06-18 MED ORDER — HYDRALAZINE HCL 25 MG PO TABS: 25.0000 mg | ORAL_TABLET | Freq: Two times a day (BID) | ORAL | 1 refills | Status: DC

## 2020-06-18 MED ORDER — POTASSIUM CHLORIDE CRYS ER 20 MEQ PO TBCR
40.0000 meq | EXTENDED_RELEASE_TABLET | Freq: Once | ORAL | Status: AC
  Administered 2020-06-18: 40 meq via ORAL
  Filled 2020-06-18: qty 2

## 2020-06-18 MED ORDER — LOSARTAN POTASSIUM 25 MG PO TABS: 25.0000 mg | ORAL_TABLET | Freq: Every day | ORAL | Status: DC

## 2020-06-18 MED ORDER — WARFARIN SODIUM 3 MG PO TABS
6.0000 mg | ORAL_TABLET | Freq: Once | ORAL | Status: DC
  Filled 2020-06-18: qty 1

## 2020-06-18 NOTE — Progress Notes (Signed)
Physical Therapy Treatment Patient Details Name: Gloria [name] MRN: [id number] DOB: 10-24-1930 Today's Date: 06/18/2020    History of Present Illness 87 y.o. female presented with complaints of dizziness. Began when woke up and tried to get OOB. 5/26 +COVID 19; CT head and cervical spine no acute changes; BP 209/76 HR 40s; + chest pain with EKG normal and high sensitivity troponin negative;   PMH- significant of hypertension, hyperlipidemia, paroxysmal atrial fibrillation on Coumadin, hepatitis C, and GERD    PT Comments    Patient reports one episode of feeling like she was falling over as she laid down on the bed (yesterday during OT session). Repeated maneuvers for assessing all 6 canals with no vertigo or nystagmus elicited. Patient up to bathroom and back to bed with no vertigo. Patient with no imbalance with use of RW.  Patient continues to fatigue easily with mild dyspnea    Follow Up Recommendations  Home health PT;Supervision for mobility/OOB (per pt, daughter can stay with her; HHPT for vestibular rehab)     Equipment Recommendations  None recommended by PT    Recommendations for Other Services       Precautions / Restrictions Precautions Precautions: Fall Restrictions Weight Bearing Restrictions: No     Vestibular Re-assessment  06/18/20 1058  Positional Testing  Dix-Hallpike Dix-Hallpike Right;Dix-Hallpike Left  Sidelying Test Sidelying Left  Horizontal Canal Testing Horizontal Canal Right;Horizontal Canal Left  Dix-Hallpike Right  Dix-Hallpike Right Duration 0  Dix-Hallpike Right Symptoms No nystagmus  Dix-Hallpike Left  Dix-Hallpike Left Duration 0  Dix-Hallpike Left Symptoms No nystagmus  Sidelying Left  Sidelying Left Duration 0  Sidelying Left Symptoms No nystagmus  Horizontal Canal Right  Horizontal Canal Right Duration 0  Horizontal Canal Right Symptoms Normal  Horizontal Canal Left  Horizontal Canal Left Duration 0  Horizontal Canal Left  Symptoms Normal   Mobility  Bed Mobility Overal bed mobility: Independent Bed Mobility: Supine to Sit;Sit to Supine Rolling: Independent Sidelying to sit: Independent     Sit to sidelying: Independent      Transfers Overall transfer level: Needs assistance Equipment used: Rolling walker (2 wheeled);None Transfers: Sit to/from Stand Sit to Stand: Min guard         General transfer comment: cueing for hand placement; pt required incr time and effort  Ambulation/Gait Ambulation/Gait assistance: Min guard Gait Distance (Feet): 15 Feet (toileted, 15) Assistive device: Rolling walker (2 wheeled) Gait Pattern/deviations: Step-through pattern;Decreased stride length;Trendelenburg;Wide base of support Gait velocity: decr   General Gait Details: denied dizziness/vertigo as getting up and walking   Stairs             Wheelchair Mobility    Modified Rankin (Stroke Patients Only)       Balance Overall balance assessment: Needs assistance Sitting-balance support: No upper extremity supported;Feet supported Sitting balance-Leahy Scale: Fair     Standing balance support: Bilateral upper extremity supported;During functional activity;Single extremity supported Standing balance-Leahy Scale: Fair Standing balance comment: no support during washing hands                            Cognition Arousal/Alertness: Awake/alert Behavior During Therapy: Flat affect Overall Cognitive Status: Within Functional Limits for tasks assessed                                        Exercises  General Comments        Pertinent Vitals/Pain Pain Assessment: No/denies pain    Home Living                      Prior Function            PT Goals (current goals can now be found in the care plan section) Acute Rehab PT Goals Patient Stated Goal: feel steady again Time For Goal Achievement: 07/01/20 Potential to Achieve Goals:  Good Progress towards PT goals: Progressing toward goals    Frequency    Min 3X/week      PT Plan Current plan remains appropriate    Co-evaluation              AM-PAC PT "6 Clicks" Mobility   Outcome Measure  Help needed turning from your back to your side while in a flat bed without using bedrails?: None Help needed moving from lying on your back to sitting on the side of a flat bed without using bedrails?: None Help needed moving to and from a bed to a chair (including a wheelchair)?: A Little Help needed standing up from a chair using your arms (e.g., wheelchair or bedside chair)?: A Little Help needed to walk in hospital room?: A Little Help needed climbing 3-5 steps with a railing? : A Little 6 Click Score: 20    End of Session Equipment Utilized During Treatment: Gait belt Activity Tolerance: Patient tolerated treatment well Patient left: in bed;with call bell/phone within reach;with bed alarm set Nurse Communication: Mobility status PT Visit Diagnosis: Unsteadiness on feet (R26.81);Dizziness and giddiness (R42)     Time: 1610-9604 PT Time Calculation (min) (ACUTE ONLY): 28 min  Charges:  $Therapeutic Activity: 8-22 mins $Neuromuscular Re-education: 8-22 mins                      Arby Barrette, PT Pager [phone]    Rexanne Mano 06/18/2020, 10:58 AM

## 2020-06-18 NOTE — Discharge Summary (Signed)
Physician Discharge Summary  Gloria [name] ZRA:[id number] DOB: [birth year] DOA: 06/16/2020  PCP: Leanna Battles, MD  Admit date: 06/16/2020 Discharge date: 06/18/2020  Time spent: 55 minutes  Recommendations for Outpatient Follow-up:  Follow-up with Dr. Martinique, cardiology in 1-2 weeks for follow-up on A. fib.  Patient's flecainide was discontinued during the hospitalization due to bradycardia. Follow-up with Leanna Battles, MD in 2 weeks.  On follow-up patient will need a basic metabolic profile done to follow-up on electrolytes and renal function.  Blood pressure need to be reassessed as patient's HCTZ was discontinued and patient started on hydralazine.  Patient shortness of breath will need to be reassessed.  Patient discharged on [facility] and albuterol MDI as needed.  Patient may benefit from outpatient referral to pulmonary for further evaluation and PFTs.   Discharge Diagnoses:  Principal Problem:   Dizziness Active Problems:   BPPV (benign paroxysmal positional vertigo), left   PAF (paroxysmal atrial fibrillation) (HCC)   Hypokalemia   Warfarin anticoagulation   SOB (shortness of breath)   Chest pain   Long term (current) use of anticoagulants   Sinus bradycardia   History of COVID-19   Hypertensive urgency   Dehydration   Bradycardia   Discharge Condition: Stable and improved  Diet recommendation: Heart healthy  Filed Weights   06/16/20 0953  Weight: 65.8 kg    History of present illness:  HPI per Dr. Tania Ade is a 87 y.o. female with medical history significant of hypertension, hyperlipidemia, paroxysmal atrial fibrillation on Coumadin, hepatitis C, and GERD presented with complaints of dizziness.  Symptoms started after she had woken up this morning and was getting out of bed.  Complained of feeling like the room may have been spinning around her.  Denied any loss of consciousness and did not fall.  Patient reported associated symptoms of frontal  headache, epigastric pain, left-sided chest pain, severe bilateral foot cramping, and now shortness of breath.  Denied having any focal weakness, slurred speech, facial droop, or changes in vision.  She had recently tested positive for COVID-19 on 5/26 for the patient's daughter.  She reported that she had been trying to eat bananas and yogurt at home.  Denied having any episodes of vomiting or dysuria.   ED Course: Upon admission into the emergency department patient was seen to be afebrile, pulse 42-93, respirations 12-28, blood pressure 138/66- 209/76, and O2 saturation maintained on room air.  Labs significant for potassium 3.4, calcium 7.9, albumin 3, high-sensitivity troponin 11, and INR 3.  Urinalysis significant for elevated pH and ketones.  CT scan of the head and cervical spine showed no acute intercranial abnormality and similar small heavily calcified meningioma.  Chest x-ray only noted mild basilar scarring without any acute abnormality.  Patient has been given hydralazine 10 mg IV and 10 mEq of potassium chloride.  TRH called to admit.  Hospital Course:  1 acute dizziness secondary to atypical BPPV -Patient had presented complaints of dizziness upon waking up in the morning noted on presentation to have a hypertensive urgency. -Patient noted to have tested positive for COVID-19 on memorial day and also patient noted to have decreased oral intake. -MRI brain done negative for any acute abnormalities or subacute CVA. -CT angiogram head and neck done negative for any acute abnormalities. -Patient assessed by PT for vestibular evaluation and noted to have a positive left head roll for left horizontal canal BPPV and patient treated with Appiani maneuver per PT with clinical improvement.. -Patient's HCTZ was  discontinued.   -Patient hydrated gently with IV fluids.   -Patient be discharged home with home PT for vestibular management and follow-up.    2.  Dehydration -HCTZ  discontinued. -Patient hydrated with IV fluids and was euvolemic by day of discharge.   3.  Hypertensive urgency -Patient noted on presentation to have blood pressure of 209/76. -Patient noted to be on HCTZ, losartan, flecainide. -Flecainide held due to bouts of bradycardia. -HCTZ discontinued during the hospitalization due to dehydration.  -Patient maintained on home regimen losartan and hydralazine 25 mg twice daily was added to patient's regimen for better blood pressure control.   -Outpatient follow-up with PCP.  4.  Chest pain/epigastric pain -Patient remained chest pain-free throughout the hospitalization.. -High-sensitivity troponin negative. -EKG without any significant ischemic changes. -2D echo 60 to 62%,BJSE, grade 2 diastolic dysfunction, normal right ventricular systolic function, severely dilated left atrial size, normal mitral valve. -Patient maintained on IV PPI twice daily as well as GI cocktail as needed.   -Patient with discharge home  on PPI twice daily.   -Outpatient follow-up with PCP.   5.  Shortness of breath -Patient reported shortness of breath. -Daughter stated patient has been short of breath with activity for over about 8 months. -Chest x-ray with no acute abnormalities. -Low suspicion for PE as patient with therapeutic INR on admission of 3. -Slight elevation of BNP however patient clinically dehydrated. -2D echo negative for aortic stenosis or significant valvular disease. -Patient with extensive tobacco history concern for possible COPD. -Patient was placed on Xopenex and Atrovent nebs scheduled, Dulera with some clinical improvement. -Patient was discharged on Dulera and albuterol MDI as needed. -We will need outpatient follow-up with pulmonary for PFTs and further evaluation.  6.  Sinus bradycardia -Patient noted on admission to have heart rates in the 40s. -It was noted that during last evaluation patient's cardiologist Dr. Martinique heart rates were  in the 29s. -Admitting physician Dr. Tamala Julian discussed case with Dr.Nahser who got in contact with patient's primary cardiologist Dr. Martinique and he was noted to be okay to hold patient's flecainide for now and monitor on telemetry. -Flecainide held and discontinued on discharge.   -Outpatient follow-up with primary cardiologist.    7.  Hypokalemia -Repleted.  8.  Paroxysmal atrial fibrillation on chronic anticoagulation -Patient in normal sinus rhythm. -Flecainide was held due to bradycardia. -INR on admission was therapeutic at 3. -INR 2.0 on day of discharge.   -Coumadin was managed by pharmacy during the hospitalization.   -Flecainide discontinued on discharge. -Outpatient follow-up with primary cardiologist.   9.  Hyperlipidemia -Patient maintained on statin.  10.  Gastroesophageal reflux disease -Patient maintained on PPI every 12 hours as well as GI cocktail as needed.   -Outpatient follow-up with PCP.   11.  Recent history of COVID-19 infection -Per admitting physician patient noted to have a COVID-19 infection around [facility]/06/05/2020. -Screening COVID-19 PCR negative.      Procedures: 2D echo 06/17/2020 CT angiogram head and neck 06/16/2020 Chest x-ray 06/16/2020 MRI brain 06/16/2020  Consultations: Vestibular PT  Discharge Exam: Vitals:   06/18/20 0424 06/18/20 1208  BP: (!) 152/62 (!) 130/50  Pulse: (!) 55 61  Resp: 16 16  Temp: 98.3 F (36.8 C) 97.8 F (36.6 C)  SpO2: 94% 95%    General: NAD. Cardiovascular: Regular rate rhythm no murmurs rubs or gallops.  No JVD.  No lower extremity edema. Respiratory: Lungs clear to auscultation bilaterally.  No wheezes, no crackles, no rhonchi.  Normal respiratory effort.  Discharge Instructions   Discharge Instructions     Diet - low sodium heart healthy   Complete by: As directed    Increase activity slowly   Complete by: As directed       Allergies as of 06/18/2020       Reactions   Amlodipine  Swelling   Cephalexin Other (See Comments)   unknown   Codeine Other (See Comments)   unknown   Metoclopramide Hcl Other (See Comments)   unknown   Nitrofuran Derivatives Other (See Comments)   unknown        Medication List     STOP taking these medications    flecainide 50 MG tablet Commonly known as: TAMBOCOR   hydrochlorothiazide 25 MG tablet Commonly known as: HYDRODIURIL       TAKE these medications    acetaminophen 325 MG tablet Commonly known as: TYLENOL Take 2 tablets (650 mg total) by mouth every 6 (six) hours as needed for mild pain (or Fever >/= 101).   albuterol 108 (90 Base) MCG/ACT inhaler Commonly known as: VENTOLIN HFA Inhale 2 puffs into the lungs every 6 (six) hours as needed for wheezing or shortness of breath.   atorvastatin 10 MG tablet Commonly known as: LIPITOR Take 10 mg by mouth at bedtime.   ergocalciferol 1.25 MG (50000 UT) capsule Commonly known as: VITAMIN D2 Take 50,000 Units by mouth every Friday.   gabapentin 100 MG capsule Commonly known as: NEURONTIN Take 100 mg by mouth daily as needed (pain).   hydrALAZINE 25 MG tablet Commonly known as: APRESOLINE Take 1 tablet (25 mg total) by mouth 2 (two) times daily.   hydrocortisone cream 0.5 % Apply 1 application topically 2 (two) times daily.   losartan 25 MG tablet Commonly known as: COZAAR Take 1 tablet (25 mg total) by mouth at bedtime.   mometasone-formoterol 100-5 MCG/ACT Aero Commonly known as: DULERA Inhale 2 puffs into the lungs 2 (two) times daily.   nitroGLYCERIN 0.4 MG SL tablet Commonly known as: NITROSTAT Place 1 tablet (0.4 mg total) under the tongue every 5 (five) minutes as needed for chest pain.   omeprazole 40 MG capsule Commonly known as: PRILOSEC Take 40 mg by mouth 2 (two) times daily.   PRESERVISION AREDS PO Take 1 capsule by mouth 2 (two) times daily.   Procto-Med HC 2.5 % rectal cream Generic drug: hydrocortisone Apply 1 application  topically 2 (two) times daily.   Systane 0.4-0.3 % Soln Generic drug: Polyethyl Glycol-Propyl Glycol Place 1 drop into both eyes daily as needed.   vitamin B-12 1000 MCG tablet Commonly known as: CYANOCOBALAMIN Take 1,000 mcg by mouth every evening.   warfarin 6 MG tablet Commonly known as: COUMADIN Take as directed. If you are unsure how to take this medication, talk to your nurse or doctor. Original instructions: Take 3-6 mg by mouth See admin instructions. Take 6 mg by mouth daily on Monday, Wednesday  Take 3 mg by mouth daily on all other days.   zolpidem 5 MG tablet Commonly known as: AMBIEN Take 5 mg by mouth at bedtime as needed.       Allergies  Allergen Reactions   Amlodipine Swelling   Cephalexin Other (See Comments)    unknown   Codeine Other (See Comments)    unknown   Metoclopramide Hcl Other (See Comments)    unknown   Nitrofuran Derivatives Other (See Comments)    unknown    Follow-up Information  [facility].   Specialty: [place] Why: For home health services, they will call you in 1-2 days to set up a time to come out to the house Contact information: [address] [place] [address] [phone]         Martinique, Peter M, MD. Schedule an appointment as soon as possible for a visit in 1 week(s).   Specialty: Cardiology Why: f/u in 1-2 weeks. Contact information: [place] [address] [phone]         Leanna Battles, MD. Schedule an appointment as soon as possible for a visit in 2 week(s).   Specialty: Internal Medicine Contact information: [place] [address] [phone]                  The results of significant diagnostics from this hospitalization (including imaging, microbiology, ancillary and laboratory) are listed below for reference.    Significant Diagnostic Studies: CT Angio Head W or Wo Contrast  Result Date:  06/16/2020 CLINICAL DATA:  Vertigo EXAM: CT ANGIOGRAPHY HEAD TECHNIQUE: Multidetector CT imaging of the head was performed using the standard protocol during bolus administration of intravenous contrast. Multiplanar CT image reconstructions and MIPs were obtained to evaluate the vascular anatomy. CONTRAST:  58mL OMNIPAQUE IOHEXOL 350 MG/ML SOLN COMPARISON:  2018 MRI head and MRA head FINDINGS: CT HEAD Brain: There is no acute intracranial hemorrhage or edema. Gray-white differentiation is preserved. There is no extra-axial fluid collection. Heavily calcified meningioma along the posterior right cerebral convexity abutting the superior sagittal sinus is not substantially changed. Prominence of the ventricles and sulci reflects mild generalized parenchymal volume loss. Patchy hypoattenuation in the supratentorial white matter is nonspecific but may reflect moderate chronic microvascular ischemic changes. Chronic small vessel infarcts of the right basal ganglia. Vascular: Better evaluated on CTA portion.  No hyperdense vessel. Skull: Unremarkable. Sinuses/Orbits: No acute finding. Other: None. Review of the MIP images confirms the above findings CTA NECK Aortic arch: Mixed plaque along the visualized arch and at the patent great vessel origins. Right carotid system: Patent. Primarily calcified plaque at the common carotid bifurcation and proximal internal carotid with less than 50% stenosis. Left carotid system: Patent. Calcified plaque at the common carotid bifurcation and proximal internal carotid causing less than 50% stenosis. Vertebral arteries: Patent. Right vertebral artery dominant. No significant stenosis or evidence of dissection. Skeleton: Degenerative changes of the included spine. Other neck: Unremarkable. Upper chest: No apical lung mass. Review of the MIP images confirms the above findings CTA HEAD Anterior circulation: Intracranial internal carotid arteries are patent with calcified plaque but no  significant stenosis. Middle cerebral arteries are patent. Posterior circulation: Intracranial vertebral arteries are patent with minimal calcified plaque on the left. Basilar artery is patent. Major cerebellar artery origins are patent. Posterior cerebral arteries are patent. Venous sinuses: Patent as allowed by contrast bolus timing. Review of the MIP images confirms the above findings IMPRESSION: No acute intracranial abnormality. Chronic microvascular ischemic changes. Chronic right basal ganglia infarcts. Similar small heavily calcified meningioma. No large vessel occlusion or evidence of dissection. Plaque at the ICA origins causes less than 50% stenosis. Electronically Signed   By: Macy Mis M.D.   On: 06/16/2020 14:48   CT Angio Neck W and/or Wo Contrast  Result Date: 06/16/2020 CLINICAL DATA:  Vertigo EXAM: CT ANGIOGRAPHY HEAD TECHNIQUE: Multidetector CT imaging of the head was performed using the standard protocol during bolus administration of  intravenous contrast. Multiplanar CT image reconstructions and MIPs were obtained to evaluate the vascular anatomy. CONTRAST:  28mL OMNIPAQUE IOHEXOL 350 MG/ML SOLN COMPARISON:  2018 MRI head and MRA head FINDINGS: CT HEAD Brain: There is no acute intracranial hemorrhage or edema. Gray-white differentiation is preserved. There is no extra-axial fluid collection. Heavily calcified meningioma along the posterior right cerebral convexity abutting the superior sagittal sinus is not substantially changed. Prominence of the ventricles and sulci reflects mild generalized parenchymal volume loss. Patchy hypoattenuation in the supratentorial white matter is nonspecific but may reflect moderate chronic microvascular ischemic changes. Chronic small vessel infarcts of the right basal ganglia. Vascular: Better evaluated on CTA portion.  No hyperdense vessel. Skull: Unremarkable. Sinuses/Orbits: No acute finding. Other: None. Review of the MIP images confirms the above  findings CTA NECK Aortic arch: Mixed plaque along the visualized arch and at the patent great vessel origins. Right carotid system: Patent. Primarily calcified plaque at the common carotid bifurcation and proximal internal carotid with less than 50% stenosis. Left carotid system: Patent. Calcified plaque at the common carotid bifurcation and proximal internal carotid causing less than 50% stenosis. Vertebral arteries: Patent. Right vertebral artery dominant. No significant stenosis or evidence of dissection. Skeleton: Degenerative changes of the included spine. Other neck: Unremarkable. Upper chest: No apical lung mass. Review of the MIP images confirms the above findings CTA HEAD Anterior circulation: Intracranial internal carotid arteries are patent with calcified plaque but no significant stenosis. Middle cerebral arteries are patent. Posterior circulation: Intracranial vertebral arteries are patent with minimal calcified plaque on the left. Basilar artery is patent. Major cerebellar artery origins are patent. Posterior cerebral arteries are patent. Venous sinuses: Patent as allowed by contrast bolus timing. Review of the MIP images confirms the above findings IMPRESSION: No acute intracranial abnormality. Chronic microvascular ischemic changes. Chronic right basal ganglia infarcts. Similar small heavily calcified meningioma. No large vessel occlusion or evidence of dissection. Plaque at the ICA origins causes less than 50% stenosis. Electronically Signed   By: Macy Mis M.D.   On: 06/16/2020 14:48   MR BRAIN WO CONTRAST  Result Date: 06/16/2020 CLINICAL DATA:  Initial evaluation for acute dizziness. EXAM: MRI HEAD WITHOUT CONTRAST TECHNIQUE: Multiplanar, multiecho pulse sequences of the brain and surrounding structures were obtained without intravenous contrast. COMPARISON:  Prior CTA from earlier the same day as well as previous MRI from 07/14/2016 FINDINGS: Brain: Mild diffuse prominence of the CSF  containing spaces compatible generalized cerebral atrophy. Patchy and confluent T2/FLAIR hyperintensity within the periventricular and deep white matter both cerebral hemispheres most consistent with chronic small vessel ischemic disease, mild to moderate in nature, and mildly progressed as compared to 2018. Superimposed remote lacunar infarct at the right caudate and lentiform nuclei. No abnormal foci of restricted diffusion to suggest acute or subacute ischemia. Gray-white matter differentiation maintained. No encephalomalacia to suggest chronic cortical infarction. No foci of susceptibility artifact to suggest acute or chronic intracranial hemorrhage. 1.4 cm calcified meningioma at the right occipital convexity without associated mass effect, relatively stable from previous exams. No other mass lesion, mass effect or midline shift. No hydrocephalus or extra-axial fluid collection. Pituitary gland and suprasellar region within normal limits. Midline structures intact. Vascular: Major intracranial vascular flow voids are maintained. Skull and upper cervical spine: Craniocervical junction within normal limits. Bone marrow signal intensity normal. No scalp soft tissue abnormality. Sinuses/Orbits: Patient status post bilateral ocular lens replacement. Globes and orbital soft tissues demonstrate no other acute finding. Mild scattered mucosal thickening noted  within the ethmoidal air cells and maxillary sinuses. Paranasal sinuses are otherwise clear. Small right mastoid effusion noted. Visualized nasopharynx within normal limits. Other: None. IMPRESSION: 1. No acute intracranial abnormality. 2. Generalized age-related cerebral atrophy with mild-to-moderate chronic small vessel ischemic disease, mildly progressed as compared to 2018. 3. 1.4 cm calcified meningioma at the right occipital convexity without associated mass effect, stable from previous. Electronically Signed   By: Jeannine Boga M.D.   On: 06/16/2020  22:24   DG Chest Portable 1 View  Result Date: 06/16/2020 CLINICAL DATA:  Chest pain EXAM: PORTABLE CHEST 1 VIEW COMPARISON:  CT chest dated 12/12/2017 FINDINGS: Mild left basilar scarring. No focal consolidation. No pleural effusion or pneumothorax. The heart is normal in size.  Thoracic aortic atherosclerosis. IMPRESSION: No evidence of acute cardiopulmonary disease. Electronically Signed   By: Julian Hy M.D.   On: 06/16/2020 10:48   ECHOCARDIOGRAM COMPLETE  Result Date: 06/17/2020    ECHOCARDIOGRAM REPORT   Patient Name:   CATHERYN SLIFER Date of Exam: 06/17/2020 Medical Rec #:  [id number]        Height:       66.0 in Accession #:    [id number]       Weight:       145.0 lb Date of Birth:  November 04, 1930        BSA:          1.744 m Patient Age:    12 years         BP:           147/70 mmHg Patient Gender: F                HR:           55 bpm. Exam Location:  Inpatient Procedure: 2D Echo, Cardiac Doppler and Color Doppler Indications:    Chest pain  History:        Patient has prior history of Echocardiogram examinations, most                 recent 06/05/2016. Arrythmias:Atrial Fibrillation; Risk                 Factors:Former Smoker, Hypertension and Dyslipidemia. GERD.  Sonographer:    Clayton Lefort RDCS (AE) Referring Phys: [place]  Sonographer Comments: Suboptimal parasternal window. IMPRESSIONS  1. Left ventricular ejection fraction, by estimation, is 60 to 65%. The left ventricle has normal function. The left ventricle has no regional wall motion abnormalities. There is mild concentric left ventricular hypertrophy. Left ventricular diastolic parameters are consistent with Grade II diastolic dysfunction (pseudonormalization). Elevated left ventricular end-diastolic pressure.  2. Right ventricular systolic function is normal. The right ventricular size is normal. Tricuspid regurgitation signal is inadequate for assessing PA pressure.  3. Left atrial size was severely dilated.  4. The  mitral valve is normal in structure. Trivial mitral valve regurgitation. No evidence of mitral stenosis.  5. The aortic valve is tricuspid. Aortic valve regurgitation is not visualized. Mild to moderate aortic valve sclerosis/calcification is present, without any evidence of aortic stenosis. Aortic valve area, by VTI measures 1.82 cm. Aortic valve mean gradient measures 9.0 mmHg. Aortic valve Vmax measures 2.08 m/s.  6. Aortic dilatation noted. There is mild dilatation of the aortic root, measuring 38 mm.  7. The inferior vena cava is normal in size with greater than 50% respiratory variability, suggesting right atrial pressure of 3 mmHg. FINDINGS  Left Ventricle: Left ventricular ejection fraction, by  estimation, is 60 to 65%. The left ventricle has normal function. The left ventricle has no regional wall motion abnormalities. The left ventricular internal cavity size was normal in size. There is  mild concentric left ventricular hypertrophy. Left ventricular diastolic parameters are consistent with Grade II diastolic dysfunction (pseudonormalization). Elevated left ventricular end-diastolic pressure. Right Ventricle: The right ventricular size is normal. No increase in right ventricular wall thickness. Right ventricular systolic function is normal. Tricuspid regurgitation signal is inadequate for assessing PA pressure. Left Atrium: Left atrial size was severely dilated. Right Atrium: Right atrial size was normal in size. Pericardium: There is no evidence of pericardial effusion. Mitral Valve: The mitral valve is normal in structure. Mild mitral annular calcification. Trivial mitral valve regurgitation. No evidence of mitral valve stenosis. MV peak gradient, 6.1 mmHg. The mean mitral valve gradient is 2.0 mmHg. Tricuspid Valve: The tricuspid valve is normal in structure. Tricuspid valve regurgitation is not demonstrated. No evidence of tricuspid stenosis. Aortic Valve: The aortic valve is tricuspid. Aortic valve  regurgitation is not visualized. Mild to moderate aortic valve sclerosis/calcification is present, without any evidence of aortic stenosis. Aortic valve mean gradient measures 9.0 mmHg. Aortic valve peak gradient measures 17.3 mmHg. Aortic valve area, by VTI measures 1.82 cm. Pulmonic Valve: The pulmonic valve was normal in structure. Pulmonic valve regurgitation is not visualized. No evidence of pulmonic stenosis. Aorta: Aortic dilatation noted. There is mild dilatation of the aortic root, measuring 38 mm. Venous: The inferior vena cava is normal in size with greater than 50% respiratory variability, suggesting right atrial pressure of 3 mmHg. IAS/Shunts: No atrial level shunt detected by color flow Doppler.  LEFT VENTRICLE PLAX 2D LVIDd:         4.90 cm  Diastology LVIDs:         3.50 cm  LV e' medial:    4.57 cm/s LV PW:         1.20 cm  LV E/e' medial:  17.9 LV IVS:        1.10 cm  LV e' lateral:   5.77 cm/s LVOT diam:     1.90 cm  LV E/e' lateral: 14.2 LV SV:         86 LV SV Index:   49 LVOT Area:     2.84 cm  RIGHT VENTRICLE             IVC RV Basal diam:  2.70 cm     IVC diam: 1.60 cm RV S prime:     13.20 cm/s TAPSE (M-mode): 2.6 cm LEFT ATRIUM             Index       RIGHT ATRIUM           Index LA diam:        3.40 cm 1.95 cm/m  RA Area:     11.00 cm LA Vol (A2C):   72.6 ml 41.62 ml/m RA Volume:   21.40 ml  12.27 ml/m LA Vol (A4C):   81.7 ml 46.84 ml/m LA Biplane Vol: 78.9 ml 45.23 ml/m  AORTIC VALVE AV Area (Vmax):    1.99 cm AV Area (Vmean):   1.63 cm AV Area (VTI):     1.82 cm AV Vmax:           207.67 cm/s AV Vmean:          141.667 cm/s AV VTI:            0.472 m AV Peak  Grad:      17.3 mmHg AV Mean Grad:      9.0 mmHg LVOT Vmax:         146.00 cm/s LVOT Vmean:        81.600 cm/s LVOT VTI:          0.304 m LVOT/AV VTI ratio: 0.64  AORTA Ao Root diam: 3.80 cm MITRAL VALVE MV Area (PHT): 2.69 cm     SHUNTS MV Area VTI:   2.11 cm     Systemic VTI:  0.30 m MV Peak grad:  6.1 mmHg      Systemic Diam: 1.90 cm MV Mean grad:  2.0 mmHg MV Vmax:       1.23 m/s MV Vmean:      54.6 cm/s MV Decel Time: 282 msec MV E velocity: 81.80 cm/s MV A velocity: 103.00 cm/s MV E/A ratio:  0.79 Fransico Him MD Electronically signed by Fransico Him MD Signature Date/Time: 06/17/2020/11:50:58 AM    Final     Microbiology: Recent Results (from the past 240 hour(s))  SARS CORONAVIRUS 2 (TAT 6-24 HRS) Nasopharyngeal Nasopharyngeal Swab     Status: None   Collection Time: 06/16/20  5:37 PM   Specimen: Nasopharyngeal Swab  Result Value Ref Range Status   SARS Coronavirus 2 NEGATIVE NEGATIVE Final    Comment: (NOTE) SARS-CoV-2 target nucleic acids are NOT DETECTED.  The SARS-CoV-2 RNA is generally detectable in upper and lower respiratory specimens during the acute phase of infection. Negative results do not preclude SARS-CoV-2 infection, do not rule out co-infections with other pathogens, and should not be used as the sole basis for treatment or other patient management decisions. Negative results must be combined with clinical observations, patient history, and epidemiological information. The expected result is Negative.  Fact Sheet for Patients: SugarRoll.be  Fact Sheet for Healthcare Providers: https://www.woods-mathews.com/  This test is not yet approved or cleared by the [place] FDA and  has been authorized for detection and/or diagnosis of SARS-CoV-2 by FDA under an Emergency Use Authorization (EUA). This EUA will remain  in effect (meaning this test can be used) for the duration of the COVID-19 declaration under Se ction 564(b)(1) of the Act, 21 U.S.C. section 360bbb-3(b)(1), unless the authorization is terminated or revoked sooner.  Performed at [facility], [place] [address] [place], [place] [address]      Labs: Basic Metabolic Panel: Recent Labs  Lab 06/16/20 1204 06/16/20 1747 06/17/20 0146 06/18/20 0326  NA 138  --   136 137  K 3.4*  --  4.3 3.6  CL 108  --  104 105  CO2 22  --  23 23  GLUCOSE 82  --  84 96  BUN 13  --  12 13  CREATININE 0.61  --  0.72 0.76  CALCIUM 7.9*  --  9.4 9.1  MG  --  2.1  --  2.1   Liver Function Tests: Recent Labs  Lab 06/16/20 1204  AST 22  ALT 14  ALKPHOS 87  BILITOT 1.0  PROT 5.0*  ALBUMIN 3.0*   Recent Labs  Lab 06/16/20 1204  LIPASE 37   No results for input(s): AMMONIA in the last 168 hours. CBC: Recent Labs  Lab 06/16/20 1004 06/17/20 0146 06/18/20 0326  WBC 6.5 5.0 5.3  NEUTROABS 4.7  --   --   HGB 13.3 13.3 12.7  HCT 41.1 40.6 38.3  MCV 90.9 91.4 90.5  PLT 223 225 200   Cardiac  Enzymes: No results for input(s): CKTOTAL, CKMB, CKMBINDEX, TROPONINI in the last 168 hours. BNP: BNP (last 3 results) Recent Labs    06/16/20 1747  BNP 192.4*    ProBNP (last 3 results) No results for input(s): PROBNP in the last 8760 hours.  CBG: No results for input(s): GLUCAP in the last 168 hours.     Signed:  Irine Seal MD.  [facility] 06/18/2020, 2:03 PM

## 2020-06-18 NOTE — Progress Notes (Signed)
ANTICOAGULATION CONSULT NOTE - Follow Up Consult  Pharmacy Consult for Warfarin Indication: atrial fibrillation  Allergies  Allergen Reactions   Amlodipine Swelling   Cephalexin Other (See Comments)    unknown   Codeine Other (See Comments)    unknown   Metoclopramide Hcl Other (See Comments)    unknown   Nitrofuran Derivatives Other (See Comments)    unknown    Patient Measurements: Height: 5\' 6"  (167.6 cm) Weight: 65.8 kg (145 lb) IBW/kg (Calculated) : 59.3 Heparin Dosing Weight: 65.8 kg  Vital Signs: Temp: 98.3 F (36.8 C) (06/12 0424) Temp Source: Oral (06/12 0424) BP: 152/62 (06/12 0424) Pulse Rate: 55 (06/12 0424)  Labs: Recent Labs    06/16/20 1004 06/16/20 1204 06/17/20 0146 06/18/20 0326  HGB 13.3  --  13.3 12.7  HCT 41.1  --  40.6 38.3  PLT 223  --  225 200  LABPROT  --  30.7* 21.9* 22.9*  INR  --  3.0* 1.9* 2.0*  CREATININE  --  0.61 0.72 0.76  TROPONINIHS  --  11  --   --     Estimated Creatinine Clearance: 45.5 mL/min (by C-G formula based on SCr of 0.76 mg/dL).   Assessment: 87 year old female who presented with dizziness. On warfarin at home for history of atrial fibrillation. Pharmacy consulted to dose while inpatient.   INR remains therapeutic at 2. CBC is normal.  Home warfarin regimen: 3mg  on Mondays and Fridays, 6 on all other days.   Goal of Therapy:  INR 2-3 Monitor platelets by anticoagulation protocol: Yes   Plan:  Warfarin 6 mg po x 1 per home regimen.  Daily PT/ INR and monitor for signs and symptoms of bleeding If remains stable, consider resuming home regimen and MWF INR checks  Sloan Leiter, PharmD, BCPS, BCCCP Clinical Pharmacist Please refer to [facility] for [place] numbers 06/18/2020,11:53 AM

## 2020-06-18 NOTE — Progress Notes (Signed)
Patient has ordered for discharge. Given discharge instructions with paper to the patient. Iv removed. Given all belongings to the patient. 

## 2020-06-19 ENCOUNTER — Telehealth: Payer: Self-pay | Admitting: Cardiology

## 2020-06-19 NOTE — Telephone Encounter (Signed)
    Pt c/o medication issue:  1. Name of Medication: Flecanide, hydrochlorothiazide  2. How are you currently taking this medication (dosage and times per day)?   3. Are you having a reaction (difficulty breathing--STAT)?   4. What is your medication issue? Pt said she was at the hospital and need to f/u with Dr. Martinique or APP this week. She also said she wanted to discuss these medication because the doctor at the hospital discontinued her Flecanide and changed her hydrochlorothiazide to a different medication., she is staying with her daughter and ask to call her on her cell# [phone]

## 2020-06-19 NOTE — Telephone Encounter (Signed)
Spoke to patient she stated she was discharged from hospital this past weekend and was told to schedule appointment this week.Appointment scheduled with Almyra Deforest PA Friday 6/17 at 1:15 pm.

## 2020-06-22 DIAGNOSIS — Z8673 Personal history of transient ischemic attack (TIA), and cerebral infarction without residual deficits: Secondary | ICD-10-CM | POA: Diagnosis not present

## 2020-06-22 DIAGNOSIS — M1711 Unilateral primary osteoarthritis, right knee: Secondary | ICD-10-CM | POA: Diagnosis not present

## 2020-06-22 DIAGNOSIS — I480 Paroxysmal atrial fibrillation: Secondary | ICD-10-CM | POA: Diagnosis not present

## 2020-06-22 DIAGNOSIS — Z86010 Personal history of colonic polyps: Secondary | ICD-10-CM | POA: Diagnosis not present

## 2020-06-22 DIAGNOSIS — E785 Hyperlipidemia, unspecified: Secondary | ICD-10-CM | POA: Diagnosis not present

## 2020-06-22 DIAGNOSIS — D329 Benign neoplasm of meninges, unspecified: Secondary | ICD-10-CM | POA: Diagnosis not present

## 2020-06-22 DIAGNOSIS — K219 Gastro-esophageal reflux disease without esophagitis: Secondary | ICD-10-CM | POA: Diagnosis not present

## 2020-06-22 DIAGNOSIS — E876 Hypokalemia: Secondary | ICD-10-CM | POA: Diagnosis not present

## 2020-06-22 DIAGNOSIS — I10 Essential (primary) hypertension: Secondary | ICD-10-CM | POA: Diagnosis not present

## 2020-06-22 DIAGNOSIS — Z7951 Long term (current) use of inhaled steroids: Secondary | ICD-10-CM | POA: Diagnosis not present

## 2020-06-22 DIAGNOSIS — I160 Hypertensive urgency: Secondary | ICD-10-CM | POA: Diagnosis not present

## 2020-06-22 DIAGNOSIS — H8112 Benign paroxysmal vertigo, left ear: Secondary | ICD-10-CM | POA: Diagnosis not present

## 2020-06-22 DIAGNOSIS — Z7901 Long term (current) use of anticoagulants: Secondary | ICD-10-CM | POA: Diagnosis not present

## 2020-06-22 DIAGNOSIS — K589 Irritable bowel syndrome without diarrhea: Secondary | ICD-10-CM | POA: Diagnosis not present

## 2020-06-22 DIAGNOSIS — Z8616 Personal history of COVID-19: Secondary | ICD-10-CM | POA: Diagnosis not present

## 2020-06-23 ENCOUNTER — Encounter: Payer: Self-pay | Admitting: Physician Assistant

## 2020-06-23 ENCOUNTER — Other Ambulatory Visit: Payer: Self-pay

## 2020-06-23 ENCOUNTER — Ambulatory Visit (INDEPENDENT_AMBULATORY_CARE_PROVIDER_SITE_OTHER): Payer: Medicare Other | Admitting: Physician Assistant

## 2020-06-23 VITALS — BP 132/88 | HR 59 | Ht 66.0 in | Wt 139.0 lb

## 2020-06-23 DIAGNOSIS — R002 Palpitations: Secondary | ICD-10-CM

## 2020-06-23 DIAGNOSIS — I10 Essential (primary) hypertension: Secondary | ICD-10-CM

## 2020-06-23 DIAGNOSIS — E785 Hyperlipidemia, unspecified: Secondary | ICD-10-CM

## 2020-06-23 DIAGNOSIS — I480 Paroxysmal atrial fibrillation: Secondary | ICD-10-CM

## 2020-06-23 MED ORDER — HYDRALAZINE HCL 25 MG PO TABS
25.0000 mg | ORAL_TABLET | Freq: Three times a day (TID) | ORAL | 1 refills | Status: DC
Start: 2020-06-23 — End: 2020-06-27

## 2020-06-23 NOTE — Progress Notes (Signed)
Cardiology Office Note:    Date:  06/25/2020   ID:  Gloria [name], DOB 1930/05/25, MRN [id number]  PCP:  Leanna Battles, MD   [facility] Providers Cardiologist:  Peter Martinique, MD     Referring MD: Leanna Battles, MD   Chief Complaint  Patient presents with   Follow-up    Seen for Dr. Martinique    History of Present Illness:    Gloria [name] is a 87 y.o. female with a hx of hypertension, hyperlipidemia, GERD and PAF.  Patient was admitted in May 2018 with chest pain and shortness of breath, serial troponin was negative.  Echocardiogram showed moderate LVH, LVEF with normal systolic function.  Myoview was normal.  She had lower extremity swelling and a rash with amlodipine.  Carotid Doppler in September 2020 showed less than 39% stenosis bilaterally.  Patient was last seen by Dr. Martinique in March 2022 at which time her blood pressure was elevated however well controlled at home.  More recently, she was tested positive for COVID on 06/01/2020.  Patient presented to the hospital on 06/16/2020 with dizziness.  On admission she was bradycardic, flecainide was held.  Blood pressure was over 200 in the hospital.  HCTZ was discontinued and she was started on hydralazine.  MRI was negative for acute abnormality.  CTA of the head and neck was negative for acute abnormality as well.  Patient was assessed by PT for vestibular evaluation noted to have posterior left head roll and was treated with Epley maneuver.  She was hydrated.  Echocardiogram obtained on 06/17/2020 showed EF 60 to 65%, grade 2 DD, mild to moderate aortic valve sclerosis without evidence of aortic stenosis, mild dilatation of the aortic root measuring at 38 mm.  Patient presents today for follow-up.  Dizziness has improved.  She still has occasional dizziness.  Blood pressure is still in the 160s at home.  On initial arrival, her systolic blood pressure was 132, however manual recheck by myself revealed systolic blood pressure 086.   I decided to increase her hydralazine to 25 mg 3 times a day.  This morning, she did have a brief palpitation that lasted about 20 minutes before resolving.  I wonder if she is having recurrence of atrial fibrillation.  I gave her 2 choices, one would be to continue to monitor the symptom to see how frequently it occurs, second option is to proceed with a heart monitor.  She wished to observe the symptom for now however is aware to contact us if her palpitation recurs so we can order a 2-week heart monitor.  Otherwise she is scheduled to see Dr. Martinique in September.  She was seeing her PCP next week and obtain basic metabolic panel in PCPs office.  Past Medical History:  Diagnosis Date   Arthritis    right   Atrial fibrillation (HCC)    GERD (gastroesophageal reflux disease)    hx of   Hepatitis C    Humerus fracture    Hyperlipidemia    Hypertension    Irritable bowel syndrome    Meningioma (HCC)    PONV (postoperative nausea and vomiting)     Past Surgical History:  Procedure Laterality Date   BREAST SURGERY     lumpectomy   CARDIOVASCULAR STRESS TEST  11/14/2006   EF 67%   COLONOSCOPY W/ BIOPSIES AND POLYPECTOMY     ORIF HUMERUS FRACTURE Right 04/18/2014   Procedure: OPEN REDUCTION INTERNAL FIXATION (ORIF) RIGHT PROXIMAL HUMERUS FRACTURE;  Surgeon: Richardson Landry  Veverly Fells, MD;  Location: [place];  Service: Orthopedics;  Laterality: Right;   ORIF PERIPROSTHETIC FRACTURE Left 04/30/2013   Procedure: OPEN REDUCTION INTERNAL FIXATION (ORIF) PERIPROSTHETIC FRACTURE;  Surgeon: [name], MD;  Location: [place];  Service: Orthopedics;  Laterality: Left;   PAROTID GLAND TUMOR EXCISION     TOTAL HIP ARTHROPLASTY  11/07   left   TOTAL KNEE ARTHROPLASTY     TOTAL KNEE ARTHROPLASTY Left 11/24/2017   Procedure: TOTAL KNEE ARTHROPLASTY;  Surgeon: Gaynelle Arabian, MD;  Location: [facility];  Service: Orthopedics;  Laterality: Left;  15min   TRANSTHORACIC ECHOCARDIOGRAM  10/25/2009   EF 55-60%    Current  Medications: Current Meds  Medication Sig   acetaminophen (TYLENOL) 325 MG tablet Take 2 tablets (650 mg total) by mouth every 6 (six) hours as needed for mild pain (or Fever >/= 101).   albuterol (VENTOLIN HFA) 108 (90 Base) MCG/ACT inhaler Inhale 2 puffs into the lungs every 6 (six) hours as needed for wheezing or shortness of breath.   atorvastatin (LIPITOR) 10 MG tablet Take 10 mg by mouth at bedtime.   ergocalciferol (VITAMIN D2) 1.25 MG (50000 UT) capsule Take 50,000 Units by mouth every Friday.   gabapentin (NEURONTIN) 100 MG capsule Take 100 mg by mouth daily as needed (pain).   hydrocortisone cream 0.5 % Apply 1 application topically 2 (two) times daily.   losartan (COZAAR) 25 MG tablet Take 1 tablet (25 mg total) by mouth at bedtime.   mometasone-formoterol (DULERA) 100-5 MCG/ACT AERO Inhale 2 puffs into the lungs 2 (two) times daily.   Multiple Vitamins-Minerals (PRESERVISION AREDS PO) Take 1 capsule by mouth 2 (two) times daily.   nitroGLYCERIN (NITROSTAT) 0.4 MG SL tablet Place 1 tablet (0.4 mg total) under the tongue every 5 (five) minutes as needed for chest pain.   omeprazole (PRILOSEC) 40 MG capsule Take 40 mg by mouth 2 (two) times daily.   Polyethyl Glycol-Propyl Glycol (SYSTANE) 0.4-0.3 % SOLN Place 1 drop into both eyes daily as needed.   PROCTO-MED HC 2.5 % rectal cream Apply 1 application topically 2 (two) times daily.   vitamin B-12 (CYANOCOBALAMIN) 1000 MCG tablet Take 1,000 mcg by mouth every evening.    warfarin (COUMADIN) 6 MG tablet Take 3-6 mg by mouth See admin instructions. Take 6 mg by mouth daily on Monday, Wednesday  Take 3 mg by mouth daily on all other days.   zolpidem (AMBIEN) 5 MG tablet Take 5 mg by mouth at bedtime as needed.   [DISCONTINUED] hydrALAZINE (APRESOLINE) 25 MG tablet Take 1 tablet (25 mg total) by mouth 2 (two) times daily.     Allergies:   Amlodipine, Cephalexin, Codeine, Metoclopramide hcl, and Nitrofuran derivatives   Social History    Socioeconomic History   Marital status: Widowed    Spouse name: Not on file   Number of children: 2   Years of education: Not on file   Highest education level: Not on file  Occupational History    Employer: RETIRED  Tobacco Use   Smoking status: Former    Packs/day: 0.30    Years: 15.00    Pack years: 4.50    Types: Cigarettes    Quit date: 06/07/1980    Years since quitting: 40.0   Smokeless tobacco: Never  Vaping Use   Vaping Use: Never used  Substance and Sexual Activity   Alcohol use: No   Drug use: No   Sexual activity: Never  Other Topics Concern  Not on file  Social History Narrative   Not on file   Social Determinants of Health   Financial Resource Strain: Not on file  Food Insecurity: Not on file  Transportation Needs: Not on file  Physical Activity: Not on file  Stress: Not on file  Social Connections: Not on file     Family History: The patient's family history includes Heart disease in her brother. There is no history of Breast cancer.  ROS:   Please see the history of present illness.     All other systems reviewed and are negative.  EKGs/Labs/Other Studies Reviewed:    The following studies were reviewed today:  Echo 06/17/2020  1. Left ventricular ejection fraction, by estimation, is 60 to 65%. The  left ventricle has normal function. The left ventricle has no regional  wall motion abnormalities. There is mild concentric left ventricular  hypertrophy. Left ventricular diastolic  parameters are consistent with Grade II diastolic dysfunction  (pseudonormalization). Elevated left ventricular end-diastolic pressure.   2. Right ventricular systolic function is normal. The right ventricular  size is normal. Tricuspid regurgitation signal is inadequate for assessing  PA pressure.   3. Left atrial size was severely dilated.   4. The mitral valve is normal in structure. Trivial mitral valve  regurgitation. No evidence of mitral stenosis.   5. The  aortic valve is tricuspid. Aortic valve regurgitation is not  visualized. Mild to moderate aortic valve sclerosis/calcification is  present, without any evidence of aortic stenosis. Aortic valve area, by  VTI measures 1.82 cm. Aortic valve mean  gradient measures 9.0 mmHg. Aortic valve Vmax measures 2.08 m/s.   6. Aortic dilatation noted. There is mild dilatation of the aortic root,  measuring 38 mm.   7. The inferior vena cava is normal in size with greater than 50%  respiratory variability, suggesting right atrial pressure of 3 mmHg.   EKG:  EKG is ordered today.  The ekg ordered today demonstrates normal sinus rhythm, no significant ST-T wave changes  Recent Labs: 06/16/2020: ALT 14; B Natriuretic Peptide 192.4 06/18/2020: BUN 13; Creatinine, Ser 0.76; Hemoglobin 12.7; Magnesium 2.1; Platelets 200; Potassium 3.6; Sodium 137  Recent Lipid Panel    Component Value Date/Time   CHOL 178 07/14/2016 0536   TRIG 69 07/14/2016 0536   HDL 58 07/14/2016 0536   CHOLHDL 3.1 07/14/2016 0536   VLDL 14 07/14/2016 0536   LDLCALC 106 (H) 07/14/2016 0536     Risk Assessment/Calculations:    CHA2DS2-VASc Score = 4  This indicates a 4.8% annual risk of stroke. The patient's score is based upon: CHF History: No HTN History: Yes Diabetes History: No Stroke History: No Vascular Disease History: No Age Score: 2 Gender Score: 1         Physical Exam:    VS:  BP 132/88   Pulse (!) 59   Ht 5\' 6"  (1.676 m)   Wt 139 lb (63 kg)   SpO2 96%   BMI 22.44 kg/m     Wt Readings from Last 3 Encounters:  06/23/20 139 lb (63 kg)  06/16/20 145 lb (65.8 kg)  04/03/20 146 lb (66.2 kg)     GEN:  Well nourished, well developed in no acute distress HEENT: Normal NECK: No JVD; No carotid bruits LYMPHATICS: No lymphadenopathy CARDIAC: RRR, no murmurs, rubs, gallops RESPIRATORY:  Clear to auscultation without rales, wheezing or rhonchi  ABDOMEN: Soft, non-tender, non-distended MUSCULOSKELETAL:  No  edema; No deformity  SKIN: Warm and dry  NEUROLOGIC:  Alert and oriented x 3 PSYCHIATRIC:  Normal affect   ASSESSMENT:    1. Palpitations   2. PAF (paroxysmal atrial fibrillation) ([place])   3. Essential hypertension   4. Hyperlipidemia LDL goal <100    PLAN:    In order of problems listed above:  Palpitation: I am concerned that that she may be having recurrent atrial fibrillation.  I offered her her heart monitor, she wished to observe it for now.  However if she keeps having recurrent palpitation, we will consider a heart monitor in the future  PAF: Continue Coumadin therapy.  Hypertension: Blood pressure elevated, increase hydralazine to 25 mg 3 times a day  Hyperlipidemia: Continue Lipitor        Medication Adjustments/Labs and Tests Ordered: Current medicines are reviewed at length with the patient today.  Concerns regarding medicines are outlined above.  Orders Placed This Encounter  Procedures   EKG 12-Lead   Meds ordered this encounter  Medications   hydrALAZINE (APRESOLINE) 25 MG tablet    Sig: Take 1 tablet (25 mg total) by mouth 3 (three) times daily.    Dispense:  90 tablet    Refill:  1    Patient Instructions  Medication Instructions:  Increase Hydralazine 25 mg (1 Tablet Three (3) Times Daily *If you need a refill on your cardiac medications before your next appointment, please call your pharmacy*   Lab Work: No Labs If you have labs (blood work) drawn today and your tests are completely normal, you will receive your results only by: [place] (if you have MyChart) OR A paper copy in the mail If you have any lab test that is abnormal or we need to change your treatment, we will call you to review the results.   Testing/Procedures: No Testing   Follow-Up: At [facility], you and your health needs are our priority.  As part of our continuing mission to provide you with exceptional heart care, we have created designated Provider Care  Teams.  These Care Teams include your primary Cardiologist (physician) and Advanced Practice Providers (APPs -  Physician Assistants and Nurse Practitioners) who all work together to provide you with the care you need, when you need it.  We recommend signing up for the patient portal called "MyChart".  Sign up information is provided on this After Visit Summary.  MyChart is used to connect with patients for Virtual Visits (Telemedicine).  Patients are able to view lab/test results, encounter notes, upcoming appointments, etc.  Non-urgent messages can be sent to your provider as well.   To learn more about what you can do with MyChart, go to [email or web address].    Your next appointment:   October 06, 2020 10:30 AM  The format for your next appointment:   In Person  Provider:   Peter Martinique, MD   Other Instructions Palpitations continue call our office    Signed, Almyra Deforest, [place]  06/25/2020 11:34 PM    [place]

## 2020-06-23 NOTE — Patient Instructions (Signed)
Medication Instructions:  Increase Hydralazine 25 mg (1 Tablet Three (3) Times Daily *If you need a refill on your cardiac medications before your next appointment, please call your pharmacy*   Lab Work: No Labs If you have labs (blood work) drawn today and your tests are completely normal, you will receive your results only by: [place] (if you have MyChart) OR A paper copy in the mail If you have any lab test that is abnormal or we need to change your treatment, we will call you to review the results.   Testing/Procedures: No Testing   Follow-Up: At [facility], you and your health needs are our priority.  As part of our continuing mission to provide you with exceptional heart care, we have created designated Provider Care Teams.  These Care Teams include your primary Cardiologist (physician) and Advanced Practice Providers (APPs -  Physician Assistants and Nurse Practitioners) who all work together to provide you with the care you need, when you need it.  We recommend signing up for the patient portal called "MyChart".  Sign up information is provided on this After Visit Summary.  MyChart is used to connect with patients for Virtual Visits (Telemedicine).  Patients are able to view lab/test results, encounter notes, upcoming appointments, etc.  Non-urgent messages can be sent to your provider as well.   To learn more about what you can do with MyChart, go to [email or web address].    Your next appointment:   October 06, 2020 10:30 AM  The format for your next appointment:   In Person  Provider:   Peter Martinique, MD   Other Instructions Palpitations continue call our office

## 2020-06-25 ENCOUNTER — Encounter: Payer: Self-pay | Admitting: Physician Assistant

## 2020-06-26 ENCOUNTER — Telehealth: Payer: Self-pay | Admitting: Cardiology

## 2020-06-26 NOTE — Telephone Encounter (Signed)
Pt c/o medication issue:  1. Name of Medication: hydrALAZINE (APRESOLINE) 25 MG tablet  2. How are you currently taking this medication (dosage and times per day)? As written  3. Are you having a reaction (difficulty breathing--STAT)? No   4. What is your medication issue? Please send in a prescription in to  [place] ([place]), [place] [address]

## 2020-06-27 ENCOUNTER — Telehealth: Payer: Self-pay | Admitting: Cardiology

## 2020-06-27 MED ORDER — HYDRALAZINE HCL 25 MG PO TABS: 25.0000 mg | ORAL_TABLET | Freq: Three times a day (TID) | ORAL | 1 refills | Status: DC

## 2020-06-27 NOTE — Telephone Encounter (Signed)
Patient is following up regarding new Rx for hydralazine 25 mg 3x daily. She states her local pharmacy, listed below, did not receive it. She would like to know if it can be resubmitted.   *STAT* If patient is at the pharmacy, call can be transferred to refill team.   1. Which medications need to be refilled? (please list name of each medication and dose if known)  hydrALAZINE (APRESOLINE) 25 MG tablet  2. Which pharmacy/location (including street and city if local pharmacy) is medication to be sent to? [place] ([place]), [place] - [address]  3. Do they need a 30 day or 90 day supply? 90 day supply

## 2020-06-27 NOTE — Telephone Encounter (Signed)
Refills has been sent to the pharmacy. 

## 2020-06-29 DIAGNOSIS — I480 Paroxysmal atrial fibrillation: Secondary | ICD-10-CM | POA: Diagnosis not present

## 2020-06-29 DIAGNOSIS — K589 Irritable bowel syndrome without diarrhea: Secondary | ICD-10-CM | POA: Diagnosis not present

## 2020-06-29 DIAGNOSIS — H8112 Benign paroxysmal vertigo, left ear: Secondary | ICD-10-CM | POA: Diagnosis not present

## 2020-06-29 DIAGNOSIS — K219 Gastro-esophageal reflux disease without esophagitis: Secondary | ICD-10-CM | POA: Diagnosis not present

## 2020-06-29 DIAGNOSIS — I10 Essential (primary) hypertension: Secondary | ICD-10-CM | POA: Diagnosis not present

## 2020-06-29 DIAGNOSIS — E785 Hyperlipidemia, unspecified: Secondary | ICD-10-CM | POA: Diagnosis not present

## 2020-07-05 DIAGNOSIS — K589 Irritable bowel syndrome without diarrhea: Secondary | ICD-10-CM | POA: Diagnosis not present

## 2020-07-05 DIAGNOSIS — K219 Gastro-esophageal reflux disease without esophagitis: Secondary | ICD-10-CM | POA: Diagnosis not present

## 2020-07-05 DIAGNOSIS — I10 Essential (primary) hypertension: Secondary | ICD-10-CM | POA: Diagnosis not present

## 2020-07-05 DIAGNOSIS — E785 Hyperlipidemia, unspecified: Secondary | ICD-10-CM | POA: Diagnosis not present

## 2020-07-05 DIAGNOSIS — I480 Paroxysmal atrial fibrillation: Secondary | ICD-10-CM | POA: Diagnosis not present

## 2020-07-05 DIAGNOSIS — H8112 Benign paroxysmal vertigo, left ear: Secondary | ICD-10-CM | POA: Diagnosis not present

## 2020-07-06 DIAGNOSIS — E785 Hyperlipidemia, unspecified: Secondary | ICD-10-CM | POA: Diagnosis not present

## 2020-07-06 DIAGNOSIS — R2681 Unsteadiness on feet: Secondary | ICD-10-CM | POA: Diagnosis not present

## 2020-07-06 DIAGNOSIS — I480 Paroxysmal atrial fibrillation: Secondary | ICD-10-CM | POA: Diagnosis not present

## 2020-07-06 DIAGNOSIS — K219 Gastro-esophageal reflux disease without esophagitis: Secondary | ICD-10-CM | POA: Diagnosis not present

## 2020-07-06 DIAGNOSIS — I10 Essential (primary) hypertension: Secondary | ICD-10-CM | POA: Diagnosis not present

## 2020-07-06 DIAGNOSIS — Z7901 Long term (current) use of anticoagulants: Secondary | ICD-10-CM | POA: Diagnosis not present

## 2020-07-12 DIAGNOSIS — K589 Irritable bowel syndrome without diarrhea: Secondary | ICD-10-CM | POA: Diagnosis not present

## 2020-07-12 DIAGNOSIS — H8112 Benign paroxysmal vertigo, left ear: Secondary | ICD-10-CM | POA: Diagnosis not present

## 2020-07-12 DIAGNOSIS — K219 Gastro-esophageal reflux disease without esophagitis: Secondary | ICD-10-CM | POA: Diagnosis not present

## 2020-07-12 DIAGNOSIS — E785 Hyperlipidemia, unspecified: Secondary | ICD-10-CM | POA: Diagnosis not present

## 2020-07-12 DIAGNOSIS — I10 Essential (primary) hypertension: Secondary | ICD-10-CM | POA: Diagnosis not present

## 2020-07-12 DIAGNOSIS — I480 Paroxysmal atrial fibrillation: Secondary | ICD-10-CM | POA: Diagnosis not present

## 2020-07-19 DIAGNOSIS — H8112 Benign paroxysmal vertigo, left ear: Secondary | ICD-10-CM | POA: Diagnosis not present

## 2020-07-19 DIAGNOSIS — I480 Paroxysmal atrial fibrillation: Secondary | ICD-10-CM | POA: Diagnosis not present

## 2020-07-19 DIAGNOSIS — K219 Gastro-esophageal reflux disease without esophagitis: Secondary | ICD-10-CM | POA: Diagnosis not present

## 2020-07-19 DIAGNOSIS — K589 Irritable bowel syndrome without diarrhea: Secondary | ICD-10-CM | POA: Diagnosis not present

## 2020-07-19 DIAGNOSIS — E785 Hyperlipidemia, unspecified: Secondary | ICD-10-CM | POA: Diagnosis not present

## 2020-07-19 DIAGNOSIS — I10 Essential (primary) hypertension: Secondary | ICD-10-CM | POA: Diagnosis not present

## 2020-07-22 DIAGNOSIS — M1711 Unilateral primary osteoarthritis, right knee: Secondary | ICD-10-CM | POA: Diagnosis not present

## 2020-07-22 DIAGNOSIS — Z7951 Long term (current) use of inhaled steroids: Secondary | ICD-10-CM | POA: Diagnosis not present

## 2020-07-22 DIAGNOSIS — Z86010 Personal history of colonic polyps: Secondary | ICD-10-CM | POA: Diagnosis not present

## 2020-07-22 DIAGNOSIS — I10 Essential (primary) hypertension: Secondary | ICD-10-CM | POA: Diagnosis not present

## 2020-07-22 DIAGNOSIS — D329 Benign neoplasm of meninges, unspecified: Secondary | ICD-10-CM | POA: Diagnosis not present

## 2020-07-22 DIAGNOSIS — Z8616 Personal history of COVID-19: Secondary | ICD-10-CM | POA: Diagnosis not present

## 2020-07-22 DIAGNOSIS — K589 Irritable bowel syndrome without diarrhea: Secondary | ICD-10-CM | POA: Diagnosis not present

## 2020-07-22 DIAGNOSIS — K219 Gastro-esophageal reflux disease without esophagitis: Secondary | ICD-10-CM | POA: Diagnosis not present

## 2020-07-22 DIAGNOSIS — I160 Hypertensive urgency: Secondary | ICD-10-CM | POA: Diagnosis not present

## 2020-07-22 DIAGNOSIS — Z7901 Long term (current) use of anticoagulants: Secondary | ICD-10-CM | POA: Diagnosis not present

## 2020-07-22 DIAGNOSIS — Z8673 Personal history of transient ischemic attack (TIA), and cerebral infarction without residual deficits: Secondary | ICD-10-CM | POA: Diagnosis not present

## 2020-07-22 DIAGNOSIS — E785 Hyperlipidemia, unspecified: Secondary | ICD-10-CM | POA: Diagnosis not present

## 2020-07-22 DIAGNOSIS — E876 Hypokalemia: Secondary | ICD-10-CM | POA: Diagnosis not present

## 2020-07-22 DIAGNOSIS — H8112 Benign paroxysmal vertigo, left ear: Secondary | ICD-10-CM | POA: Diagnosis not present

## 2020-07-22 DIAGNOSIS — I480 Paroxysmal atrial fibrillation: Secondary | ICD-10-CM | POA: Diagnosis not present

## 2020-07-24 DIAGNOSIS — I480 Paroxysmal atrial fibrillation: Secondary | ICD-10-CM | POA: Diagnosis not present

## 2020-07-24 DIAGNOSIS — Z7901 Long term (current) use of anticoagulants: Secondary | ICD-10-CM | POA: Diagnosis not present

## 2020-07-26 DIAGNOSIS — H8112 Benign paroxysmal vertigo, left ear: Secondary | ICD-10-CM | POA: Diagnosis not present

## 2020-07-26 DIAGNOSIS — I10 Essential (primary) hypertension: Secondary | ICD-10-CM | POA: Diagnosis not present

## 2020-07-26 DIAGNOSIS — K589 Irritable bowel syndrome without diarrhea: Secondary | ICD-10-CM | POA: Diagnosis not present

## 2020-07-26 DIAGNOSIS — K219 Gastro-esophageal reflux disease without esophagitis: Secondary | ICD-10-CM | POA: Diagnosis not present

## 2020-07-26 DIAGNOSIS — I480 Paroxysmal atrial fibrillation: Secondary | ICD-10-CM | POA: Diagnosis not present

## 2020-07-26 DIAGNOSIS — E785 Hyperlipidemia, unspecified: Secondary | ICD-10-CM | POA: Diagnosis not present

## 2020-08-01 DIAGNOSIS — I480 Paroxysmal atrial fibrillation: Secondary | ICD-10-CM | POA: Diagnosis not present

## 2020-08-01 DIAGNOSIS — H8112 Benign paroxysmal vertigo, left ear: Secondary | ICD-10-CM | POA: Diagnosis not present

## 2020-08-01 DIAGNOSIS — K219 Gastro-esophageal reflux disease without esophagitis: Secondary | ICD-10-CM | POA: Diagnosis not present

## 2020-08-01 DIAGNOSIS — K589 Irritable bowel syndrome without diarrhea: Secondary | ICD-10-CM | POA: Diagnosis not present

## 2020-08-01 DIAGNOSIS — E785 Hyperlipidemia, unspecified: Secondary | ICD-10-CM | POA: Diagnosis not present

## 2020-08-01 DIAGNOSIS — I10 Essential (primary) hypertension: Secondary | ICD-10-CM | POA: Diagnosis not present

## 2020-08-28 ENCOUNTER — Other Ambulatory Visit: Payer: Self-pay | Admitting: Cardiology

## 2020-09-05 DIAGNOSIS — I480 Paroxysmal atrial fibrillation: Secondary | ICD-10-CM | POA: Diagnosis not present

## 2020-09-05 DIAGNOSIS — Z7901 Long term (current) use of anticoagulants: Secondary | ICD-10-CM | POA: Diagnosis not present

## 2020-09-19 ENCOUNTER — Ambulatory Visit (INDEPENDENT_AMBULATORY_CARE_PROVIDER_SITE_OTHER): Payer: Medicare Other | Admitting: Ophthalmology

## 2020-09-19 ENCOUNTER — Other Ambulatory Visit: Payer: Self-pay

## 2020-09-19 DIAGNOSIS — H18599 Other hereditary corneal dystrophies, unspecified eye: Secondary | ICD-10-CM

## 2020-09-19 DIAGNOSIS — H353132 Nonexudative age-related macular degeneration, bilateral, intermediate dry stage: Secondary | ICD-10-CM | POA: Diagnosis not present

## 2020-09-19 DIAGNOSIS — Z961 Presence of intraocular lens: Secondary | ICD-10-CM | POA: Diagnosis not present

## 2020-09-19 DIAGNOSIS — H35033 Hypertensive retinopathy, bilateral: Secondary | ICD-10-CM

## 2020-09-19 DIAGNOSIS — I10 Essential (primary) hypertension: Secondary | ICD-10-CM | POA: Diagnosis not present

## 2020-09-19 DIAGNOSIS — H3581 Retinal edema: Secondary | ICD-10-CM

## 2020-09-19 NOTE — Progress Notes (Signed)
[facility] Note  09/19/2020     CHIEF COMPLAINT Patient presents for Retina Follow Up   HISTORY OF PRESENT ILLNESS: Gloria [name] is a 87 y.o. female who presents to the clinic today for:   HPI     Retina Follow Up   Patient presents with  Dry AMD.  In both eyes.  Duration of 6 months.  Since onset it is gradually worsening.  I, the attending physician,  performed the HPI with the patient and updated documentation appropriately.        Comments   6 month follow up ARMD OU- Vision some days are better than others.  Lately, vision seems a little worse.       Last edited by Bernarda Caffey, MD on 09/22/2020  1:19 AM.    Patient states vision the same OU. Primary eye care transferred to Dr. Delman Cheadle following Dr. Elder Negus retirement  Referring physician: Shon Hough, MD [place],  [place] [address]  HISTORICAL INFORMATION:   Selected notes from the MEDICAL RECORD NUMBER Referred by Dr. Kathrin Penner LEE: 06-28-19 Ocular Hx- ARMD OU   CURRENT MEDICATIONS: Current Outpatient Medications (Ophthalmic Drugs)  Medication Sig   Polyethyl Glycol-Propyl Glycol (SYSTANE) 0.4-0.3 % SOLN Place 1 drop into both eyes daily as needed.   No current facility-administered medications for this visit. (Ophthalmic Drugs)   Current Outpatient Medications (Other)  Medication Sig   acetaminophen (TYLENOL) 325 MG tablet Take 2 tablets (650 mg total) by mouth every 6 (six) hours as needed for mild pain (or Fever >/= 101).   albuterol (VENTOLIN HFA) 108 (90 Base) MCG/ACT inhaler Inhale 2 puffs into the lungs every 6 (six) hours as needed for wheezing or shortness of breath.   atorvastatin (LIPITOR) 10 MG tablet Take 10 mg by mouth at bedtime.   ergocalciferol (VITAMIN D2) 1.25 MG (50000 UT) capsule Take 50,000 Units by mouth every Friday.   gabapentin (NEURONTIN) 100 MG capsule Take 100 mg by mouth daily as needed (pain).   hydrALAZINE  (APRESOLINE) 25 MG tablet Take 1 tablet (25 mg total) by mouth 3 (three) times daily.   hydrocortisone cream 0.5 % Apply 1 application topically 2 (two) times daily.   losartan (COZAAR) 25 MG tablet Take 1 tablet by mouth once daily   mometasone-formoterol (DULERA) 100-5 MCG/ACT AERO Inhale 2 puffs into the lungs 2 (two) times daily.   Multiple Vitamins-Minerals (PRESERVISION AREDS PO) Take 1 capsule by mouth 2 (two) times daily.   nitroGLYCERIN (NITROSTAT) 0.4 MG SL tablet Place 1 tablet (0.4 mg total) under the tongue every 5 (five) minutes as needed for chest pain.   omeprazole (PRILOSEC) 40 MG capsule Take 40 mg by mouth 2 (two) times daily.   PROCTO-MED HC 2.5 % rectal cream Apply 1 application topically 2 (two) times daily.   vitamin B-12 (CYANOCOBALAMIN) 1000 MCG tablet Take 1,000 mcg by mouth every evening.    warfarin (COUMADIN) 6 MG tablet Take 3-6 mg by mouth See admin instructions. Take 6 mg by mouth daily on Monday, Wednesday  Take 3 mg by mouth daily on all other days.   zolpidem (AMBIEN) 5 MG tablet Take 5 mg by mouth at bedtime as needed.   No current facility-administered medications for this visit. (Other)   REVIEW OF SYSTEMS: ROS   Positive for: Neurological, Musculoskeletal, Cardiovascular, Eyes, Respiratory Negative for: Constitutional, Gastrointestinal, Skin, Genitourinary, HENT, Endocrine, Psychiatric, Allergic/Imm, Heme/Lymph Last edited by Leonie Douglas, COA on 09/19/2020  10:23 AM.     ALLERGIES Allergies  Allergen Reactions   Amlodipine Swelling   Cephalexin Other (See Comments)    unknown   Codeine Other (See Comments)    unknown   Metoclopramide Hcl Other (See Comments)    unknown   Nitrofuran Derivatives Other (See Comments)    unknown    PAST MEDICAL HISTORY Past Medical History:  Diagnosis Date   Arthritis    right   Atrial fibrillation (HCC)    GERD (gastroesophageal reflux disease)    hx of   Hepatitis C    Humerus fracture     Hyperlipidemia    Hypertension    Irritable bowel syndrome    Meningioma (HCC)    PONV (postoperative nausea and vomiting)    Past Surgical History:  Procedure Laterality Date   BREAST SURGERY     lumpectomy   CARDIOVASCULAR STRESS TEST  11/14/2006   EF 67%   COLONOSCOPY W/ BIOPSIES AND POLYPECTOMY     ORIF HUMERUS FRACTURE Right 04/18/2014   Procedure: OPEN REDUCTION INTERNAL FIXATION (ORIF) RIGHT PROXIMAL HUMERUS FRACTURE;  Surgeon: Netta Cedars, MD;  Location: [facility];  Service: Orthopedics;  Laterality: Right;   ORIF PERIPROSTHETIC FRACTURE Left 04/30/2013   Procedure: OPEN REDUCTION INTERNAL FIXATION (ORIF) PERIPROSTHETIC FRACTURE;  Surgeon: [name], MD;  Location: [facility] [facility];  Service: Orthopedics;  Laterality: Left;   PAROTID GLAND TUMOR EXCISION     TOTAL HIP ARTHROPLASTY  11/07   left   TOTAL KNEE ARTHROPLASTY     TOTAL KNEE ARTHROPLASTY Left 11/24/2017   Procedure: TOTAL KNEE ARTHROPLASTY;  Surgeon: Gaynelle Arabian, MD;  Location: [facility];  Service: Orthopedics;  Laterality: Left;  72mn   TRANSTHORACIC ECHOCARDIOGRAM  10/25/2009   EF 55-60%    FAMILY HISTORY Family History  Problem Relation Age of Onset   Heart disease Brother    Breast cancer Neg Hx     SOCIAL HISTORY Social History   Tobacco Use   Smoking status: Former    Packs/day: 0.30    Years: 15.00    Pack years: 4.50    Types: Cigarettes    Quit date: 06/07/1980    Years since quitting: 40.3   Smokeless tobacco: Never  Vaping Use   Vaping Use: Never used  Substance Use Topics   Alcohol use: No   Drug use: No         OPHTHALMIC EXAM:  Base Eye Exam     Visual Acuity (Snellen - Linear)       Right Left   Dist cc 20/60 +1 20/70 -2   Dist ph cc 20/50 +2 20/60         Tonometry (Tonopen, 10:20 AM)       Right Left   Pressure 12 10         Pupils       Dark Light Shape React APD   Right 4 3 Round Brisk None   Left 5 4 Round Brisk None         Visual Fields (Counting fingers)        Left Right    Full Full         Extraocular Movement       Right Left    Full Full         Neuro/Psych     Oriented x3: Yes   Mood/Affect: Normal         Dilation     Both eyes: 1.0% Mydriacyl, 2.5%  Phenylephrine @ 10:20 AM           Slit Lamp and Fundus Exam     Slit Lamp Exam       Right Left   Lids/Lashes Dermatochalasis - upper lid, Meibomian gland dysfunction, flesh colored cyst temporal LL margin Dermatochalasis - upper lid, Meibomian gland dysfunction   Conjunctiva/Sclera white, quiet white, quiet   Cornea +flecks; mild haze, well healed cataract wounds, 2-3+ Punctate epithelial erosions, irregular epi, arcus +flecks; well healed cataract wounds, 3+ Punctate epithelial erosions, tear film debris, irregular epi, arcus   Anterior Chamber deep; clear deep; clear   Iris round dilated round dilated   Lens PCIOL; open PC PCIOL; open PC   Vitreous syneresis syneresis         Fundus Exam       Right Left   Disc mild pallor; sharp rim mild pallor; sharp rim, mild PPA   C/D Ratio 0.2 0.1   Macula flat; drusen; central PEDs; blunted foveal reflex; RPE mottling and clumping, no heme or edema flat; blunted foveal reflex; drusen; RPE mottling and clumping, mild PEDs, no heme or edema   Vessels mild attenuation, mild tortuousity attenuated; tortuous   Periphery attached; reticular degen; +midzonal drusen attached; reticular degen; midzonal drusen           Refraction     Wearing Rx       Sphere Cylinder Axis Add   Right -0.50 +1.25 010 +3.00   Left -0.75 +2.25 006 +3.00    Type: prog           IMAGING AND PROCEDURES  Imaging and Procedures for 09/19/2020  OCT, Retina - OU - Both Eyes       Right Eye Quality was good. Central Foveal Thickness: 319. Progression has been stable. Findings include normal foveal contour, no IRF, no SRF, retinal drusen , pigment epithelial detachment, outer retinal atrophy.   Left Eye Quality was good.  Central Foveal Thickness: 298. Progression has been stable. Findings include normal foveal contour, no IRF, no SRF, pigment epithelial detachment, retinal drusen .   Notes *Images captured and stored on drive  Diagnosis / Impression:  Non-exu ARMD OU  Central PEDs OU (OD>OS)  Clinical management:  See below  Abbreviations: NFP - Normal foveal profile. CME - cystoid macular edema. PED - pigment epithelial detachment. IRF - intraretinal fluid. SRF - subretinal fluid. EZ - ellipsoid zone. ERM - epiretinal membrane. ORA - outer retinal atrophy. ORT - outer retinal tubulation. SRHM - subretinal hyper-reflective material. IRHM - intraretinal hyper-reflective material            ASSESSMENT/PLAN:    ICD-10-CM   1. Intermediate stage nonexudative age-related macular degeneration of both eyes  H35.3132     2. Retinal edema  H35.81 OCT, Retina - OU - Both Eyes    3. Fleck corneal dystrophy  H18.599     4. Essential hypertension  I10     5. Hypertensive retinopathy of both eyes  H35.033     6. Pseudophakia of both eyes  Z96.1      1,2. Age related macular degeneration, non-exudative, OU -- stable  - Intermediate stage with prominent central PEDs OU (OD > OS)  - BCVA stable at 20/50 OD, 20/60 OS  - The incidence, anatomy, and pathology of dry AMD, risk of progression, and the AREDS and AREDS 2 studies including smoking risks discussed with patient.  - cont AREDS 2 supplementation and amsler grid monitoring  - f/u  6 months DFE, OCT  3. Fleck corneal dystrophy OU  - now under the expert management of Dr. Delman Cheadle  - stable  4,5. Hypertensive retinopathy OU - discussed importance of tight BP control - monitor  6. Pseudophakia OU  - s/p CE/IOL OU  - IOLs in good position, doing well  - monitor   Ophthalmic Meds Ordered this visit:  No orders of the defined types were placed in this encounter.     Return in about 6 months (around 03/19/2021) for DFE, OCT.  There are no  Patient Instructions on file for this visit.   Explained the diagnoses, plan, and follow up with the patient and they expressed understanding.  Patient expressed understanding of the importance of proper follow up care.    This document serves as a record of services personally performed by Gardiner Sleeper, MD, PhD. It was created on their behalf by Estill Bakes, COT an ophthalmic technician. The creation of this record is the provider's dictation and/or activities during the visit.    Electronically signed by: Estill Bakes, COT 9.13.22 @ 1:24 AM   This document serves as a record of services personally performed by Gardiner Sleeper, MD, PhD. It was created on their behalf by Roselee Nova, COMT. The creation of this record is the provider's dictation and/or activities during the visit.  Electronically signed by: Roselee Nova, COMT 09/22/20 1:24 AM  Gardiner Sleeper, M.D., Ph.D. Diseases & Surgery of the Retina and Vitreous [facility]  I have reviewed the above documentation for accuracy and completeness, and I agree with the above. Gardiner Sleeper, M.D., Ph.D. 09/22/20 1:25 AM   Abbreviations: M myopia (nearsighted); A astigmatism; H hyperopia (farsighted); P presbyopia; Mrx spectacle prescription;  CTL contact lenses; OD right eye; OS left eye; OU both eyes  XT exotropia; ET esotropia; PEK punctate epithelial keratitis; PEE punctate epithelial erosions; DES dry eye syndrome; MGD meibomian gland dysfunction; ATs artificial tears; PFAT's preservative free artificial tears; [place] nuclear sclerotic cataract; PSC posterior subcapsular cataract; ERM epi-retinal membrane; PVD posterior vitreous detachment; RD retinal detachment; DM diabetes mellitus; DR diabetic retinopathy; NPDR non-proliferative diabetic retinopathy; PDR proliferative diabetic retinopathy; CSME clinically significant macular edema; DME diabetic macular edema; dbh dot blot hemorrhages; CWS cotton wool spot;  POAG primary open angle glaucoma; C/D cup-to-disc ratio; HVF humphrey visual field; GVF goldmann visual field; OCT optical coherence tomography; IOP intraocular pressure; BRVO Branch retinal vein occlusion; CRVO central retinal vein occlusion; CRAO central retinal artery occlusion; BRAO branch retinal artery occlusion; RT retinal tear; SB scleral buckle; PPV pars plana vitrectomy; VH Vitreous hemorrhage; PRP panretinal laser photocoagulation; IVK intravitreal kenalog; VMT vitreomacular traction; MH Macular hole;  NVD neovascularization of the disc; NVE neovascularization elsewhere; AREDS age related eye disease study; ARMD age related macular degeneration; POAG primary open angle glaucoma; EBMD epithelial/anterior basement membrane dystrophy; ACIOL anterior chamber intraocular lens; IOL intraocular lens; PCIOL posterior chamber intraocular lens; Phaco/IOL phacoemulsification with intraocular lens placement; [place] photorefractive keratectomy; LASIK laser assisted in situ keratomileusis; HTN hypertension; DM diabetes mellitus; COPD chronic obstructive pulmonary disease

## 2020-09-21 ENCOUNTER — Other Ambulatory Visit: Payer: Self-pay | Admitting: Internal Medicine

## 2020-09-21 DIAGNOSIS — Z1231 Encounter for screening mammogram for malignant neoplasm of breast: Secondary | ICD-10-CM

## 2020-09-21 DIAGNOSIS — U071 COVID-19: Secondary | ICD-10-CM | POA: Diagnosis not present

## 2020-09-22 ENCOUNTER — Encounter (INDEPENDENT_AMBULATORY_CARE_PROVIDER_SITE_OTHER): Payer: Self-pay | Admitting: Ophthalmology

## 2020-10-03 DIAGNOSIS — M545 Low back pain, unspecified: Secondary | ICD-10-CM | POA: Diagnosis not present

## 2020-10-03 DIAGNOSIS — Z23 Encounter for immunization: Secondary | ICD-10-CM | POA: Diagnosis not present

## 2020-10-03 DIAGNOSIS — Z7901 Long term (current) use of anticoagulants: Secondary | ICD-10-CM | POA: Diagnosis not present

## 2020-10-03 DIAGNOSIS — G8929 Other chronic pain: Secondary | ICD-10-CM | POA: Diagnosis not present

## 2020-10-03 DIAGNOSIS — I480 Paroxysmal atrial fibrillation: Secondary | ICD-10-CM | POA: Diagnosis not present

## 2020-10-04 NOTE — Progress Notes (Signed)
Cardiology Office Note:    Date:  10/06/2020   ID:  Gloria [name], DOB September 17, 1930, MRN [id number]  PCP:  Leanna Battles, MD   [facility] Providers Cardiologist:  Sallie Staron [place], MD     Referring MD: Leanna Battles, MD   Chief Complaint  Patient presents with   Atrial Fibrillation     History of Present Illness:    Gloria [name] is a 87 y.o. female with a hx of hypertension, hyperlipidemia, GERD and PAF.  Patient was admitted in May 2018 with chest pain and shortness of breath, serial troponin was negative.  Echocardiogram showed moderate LVH, LVEF with normal systolic function.  Myoview was normal.  She had lower extremity swelling and a rash with amlodipine.  Carotid Doppler in September 2020 showed less than 39% stenosis bilaterally.  More recently, she was tested positive for COVID on 06/01/2020.  Patient presented to the hospital on 06/16/2020 with dizziness.  On admission she was bradycardic, flecainide was held.  Blood pressure was over 200 in the hospital.  HCTZ was discontinued and she was started on hydralazine.  MRI was negative for acute abnormality.  CTA of the head and neck was negative for acute abnormality as well.  Patient was assessed by PT for vestibular evaluation noted to have posterior left head roll and was treated with Epley maneuver.  She was hydrated.  Echocardiogram obtained on 06/17/2020 showed EF 60 to 65%, grade 2 DD, mild to moderate aortic valve sclerosis without evidence of aortic stenosis, mild dilatation of the aortic root measuring at 38 mm.  She was seen in follow up on 06/23/20 by Almyra Deforest PA-C and her Dizziness had improved but she still has occasional dizziness.  Her BP was elevated and hydralazine dose was increased to tid. She had some palpitations but deferred further evaluation at that time.   On follow up today she is doing well. States that since May thinks she has had Afib only twice lasting less than 30 minutes. Feels pounding. No chest  pain or dizziness now but still has some imbalance. Reports BP at home 017-494 systolic over 70 diastolic. Main activity is walking in driveway which is paved.  Past Medical History:  Diagnosis Date   Arthritis    right   Atrial fibrillation (HCC)    GERD (gastroesophageal reflux disease)    hx of   Hepatitis C    Humerus fracture    Hyperlipidemia    Hypertension    Irritable bowel syndrome    Meningioma (HCC)    PONV (postoperative nausea and vomiting)     Past Surgical History:  Procedure Laterality Date   BREAST SURGERY     lumpectomy   CARDIOVASCULAR STRESS TEST  11/14/2006   EF 67%   COLONOSCOPY W/ BIOPSIES AND POLYPECTOMY     ORIF HUMERUS FRACTURE Right 04/18/2014   Procedure: OPEN REDUCTION INTERNAL FIXATION (ORIF) RIGHT PROXIMAL HUMERUS FRACTURE;  Surgeon: Netta Cedars, MD;  Location: [facility];  Service: Orthopedics;  Laterality: Right;   ORIF PERIPROSTHETIC FRACTURE Left 04/30/2013   Procedure: OPEN REDUCTION INTERNAL FIXATION (ORIF) PERIPROSTHETIC FRACTURE;  Surgeon: [name], MD;  Location: [facility];  Service: Orthopedics;  Laterality: Left;   PAROTID GLAND TUMOR EXCISION     TOTAL HIP ARTHROPLASTY  11/07   left   TOTAL KNEE ARTHROPLASTY     TOTAL KNEE ARTHROPLASTY Left 11/24/2017   Procedure: TOTAL KNEE ARTHROPLASTY;  Surgeon: Gaynelle Arabian, MD;  Location: [facility];  Service: Orthopedics;  Laterality: Left;  42min   TRANSTHORACIC ECHOCARDIOGRAM  10/25/2009   EF 55-60%    Current Medications: Current Meds  Medication Sig   acetaminophen (TYLENOL) 325 MG tablet Take 2 tablets (650 mg total) by mouth every 6 (six) hours as needed for mild pain (or Fever >/= 101).   albuterol (VENTOLIN HFA) 108 (90 Base) MCG/ACT inhaler Inhale 2 puffs into the lungs every 6 (six) hours as needed for wheezing or shortness of breath.   atorvastatin (LIPITOR) 10 MG tablet Take 10 mg by mouth at bedtime.   ergocalciferol (VITAMIN D2) 1.25 MG (50000 UT) capsule Take 50,000 Units by mouth  every Friday.   gabapentin (NEURONTIN) 100 MG capsule Take 100 mg by mouth daily as needed (pain).   hydrALAZINE (APRESOLINE) 25 MG tablet Take 1 tablet (25 mg total) by mouth 3 (three) times daily.   losartan (COZAAR) 25 MG tablet Take 1 tablet by mouth once daily   mometasone-formoterol (DULERA) 100-5 MCG/ACT AERO Inhale 2 puffs into the lungs 2 (two) times daily.   Multiple Vitamins-Minerals (PRESERVISION AREDS PO) Take 1 capsule by mouth 2 (two) times daily.   nitroGLYCERIN (NITROSTAT) 0.4 MG SL tablet Place 1 tablet (0.4 mg total) under the tongue every 5 (five) minutes as needed for chest pain.   omeprazole (PRILOSEC) 40 MG capsule Take 40 mg by mouth 2 (two) times daily.   Polyethyl Glycol-Propyl Glycol (SYSTANE) 0.4-0.3 % SOLN Place 1 drop into both eyes daily as needed.   PROCTO-MED HC 2.5 % rectal cream Apply 1 application topically 2 (two) times daily.   vitamin B-12 (CYANOCOBALAMIN) 1000 MCG tablet Take 1,000 mcg by mouth every evening.    warfarin (COUMADIN) 6 MG tablet Take 3-6 mg by mouth See admin instructions. Take 6 mg by mouth daily on Monday, Wednesday  Take 3 mg by mouth daily on all other days.   zolpidem (AMBIEN) 5 MG tablet Take 5 mg by mouth at bedtime as needed.     Allergies:   Amlodipine, Cephalexin, Codeine, Metoclopramide hcl, and Nitrofuran derivatives   Social History   Socioeconomic History   Marital status: Widowed    Spouse name: Not on file   Number of children: 2   Years of education: Not on file   Highest education level: Not on file  Occupational History    Employer: RETIRED  Tobacco Use   Smoking status: Former    Packs/day: 0.30    Years: 15.00    Pack years: 4.50    Types: Cigarettes    Quit date: 06/07/1980    Years since quitting: 40.3   Smokeless tobacco: Never  Vaping Use   Vaping Use: Never used  Substance and Sexual Activity   Alcohol use: No   Drug use: No   Sexual activity: Never  Other Topics Concern   Not on file  Social  History Narrative   Not on file   Social Determinants of Health   Financial Resource Strain: Not on file  Food Insecurity: Not on file  Transportation Needs: Not on file  Physical Activity: Not on file  Stress: Not on file  Social Connections: Not on file     Family History: The patient's family history includes Heart disease in her brother. There is no history of Breast cancer.  ROS:   Please see the history of present illness.     All other systems reviewed and are negative.  EKGs/Labs/Other Studies Reviewed:    The following studies were reviewed today:  Echo 06/17/2020  1. Left ventricular ejection fraction, by estimation, is 60 to 65%. The  left ventricle has normal function. The left ventricle has no regional  wall motion abnormalities. There is mild concentric left ventricular  hypertrophy. Left ventricular diastolic  parameters are consistent with Grade II diastolic dysfunction  (pseudonormalization). Elevated left ventricular end-diastolic pressure.   2. Right ventricular systolic function is normal. The right ventricular  size is normal. Tricuspid regurgitation signal is inadequate for assessing  PA pressure.   3. Left atrial size was severely dilated.   4. The mitral valve is normal in structure. Trivial mitral valve  regurgitation. No evidence of mitral stenosis.   5. The aortic valve is tricuspid. Aortic valve regurgitation is not  visualized. Mild to moderate aortic valve sclerosis/calcification is  present, without any evidence of aortic stenosis. Aortic valve area, by  VTI measures 1.82 cm. Aortic valve mean  gradient measures 9.0 mmHg. Aortic valve Vmax measures 2.08 m/s.   6. Aortic dilatation noted. There is mild dilatation of the aortic root,  measuring 38 mm.   7. The inferior vena cava is normal in size with greater than 50%  respiratory variability, suggesting right atrial pressure of 3 mmHg.   EKG:  EKG is not ordered today.    Recent  Labs: 06/16/2020: ALT 14; B Natriuretic Peptide 192.4 06/18/2020: BUN 13; Creatinine, Ser 0.76; Hemoglobin 12.7; Magnesium 2.1; Platelets 200; Potassium 3.6; Sodium 137  Recent Lipid Panel    Component Value Date/Time   CHOL 178 07/14/2016 0536   TRIG 69 07/14/2016 0536   HDL 58 07/14/2016 0536   CHOLHDL 3.1 07/14/2016 0536   VLDL 14 07/14/2016 0536   LDLCALC 106 (H) 07/14/2016 0536   Dated 04/18/20: Apo B 73. Cholesterol 157. LDL 77. Triglycerides 78.  Risk Assessment/Calculations:    CHA2DS2-VASc Score = 4  This indicates a 4.8% annual risk of stroke. The patient's score is based upon: CHF History: 0 HTN History: 1 Diabetes History: 0 Stroke History: 0 Vascular Disease History: 0 Age Score: 2 Gender Score: 1         Physical Exam:    VS:  BP (!) 160/80 (BP Location: Left Arm)   Pulse (!) 56   Ht 5\' 5"  (1.651 m)   Wt 145 lb (65.8 kg)   SpO2 96%   BMI 24.13 kg/m     Wt Readings from Last 3 Encounters:  10/06/20 145 lb (65.8 kg)  06/23/20 139 lb (63 kg)  06/16/20 145 lb (65.8 kg)     GEN:  Well nourished, well developed in no acute distress HEENT: Normal NECK: No JVD; No carotid bruits LYMPHATICS: No lymphadenopathy CARDIAC: RRR, no murmurs, rubs, gallops RESPIRATORY:  Clear to auscultation without rales, wheezing or rhonchi  ABDOMEN: Soft, non-tender, non-distended MUSCULOSKELETAL:  No edema; No deformity  SKIN: Warm and dry NEUROLOGIC:  Alert and oriented x 3 PSYCHIATRIC:  Normal affect   ASSESSMENT:    1. PAF (paroxysmal atrial fibrillation) ([place])   2. Essential hypertension   3. Warfarin anticoagulation   4. Hyperlipidemia LDL goal <100     PLAN:    In order of problems listed above:  PAF: Continue Coumadin therapy for anticoagulation. Off Flecainide due to significant bradycardia in hospital. On no rate slowing medication now. Fortunately episodes of Afib have been very infrequent and short lived. Follow up in 6 months.  Hypertension: Blood  pressure elevated today but home readings have been OK. Will avoid more intensive BP therapy to  minimize dizziness.   Hyperlipidemia: Continue Lipitor- well controlled.        Medication Adjustments/Labs and Tests Ordered: Current medicines are reviewed at length with the patient today.  Concerns regarding medicines are outlined above.  No orders of the defined types were placed in this encounter.  No orders of the defined types were placed in this encounter.   There are no Patient Instructions on file for this visit.   Signed, Denaja Verhoeven [place], MD  10/06/2020 10:40 AM    [facility]

## 2020-10-06 ENCOUNTER — Encounter: Payer: Self-pay | Admitting: Cardiology

## 2020-10-06 ENCOUNTER — Other Ambulatory Visit: Payer: Self-pay

## 2020-10-06 ENCOUNTER — Ambulatory Visit (INDEPENDENT_AMBULATORY_CARE_PROVIDER_SITE_OTHER): Payer: Medicare Other | Admitting: Cardiology

## 2020-10-06 VITALS — BP 160/80 | HR 56 | Ht 65.0 in | Wt 145.0 lb

## 2020-10-06 DIAGNOSIS — I480 Paroxysmal atrial fibrillation: Secondary | ICD-10-CM | POA: Diagnosis not present

## 2020-10-06 DIAGNOSIS — Z7901 Long term (current) use of anticoagulants: Secondary | ICD-10-CM

## 2020-10-06 DIAGNOSIS — E785 Hyperlipidemia, unspecified: Secondary | ICD-10-CM | POA: Diagnosis not present

## 2020-10-06 DIAGNOSIS — I10 Essential (primary) hypertension: Secondary | ICD-10-CM | POA: Diagnosis not present

## 2020-11-02 ENCOUNTER — Other Ambulatory Visit: Payer: Self-pay

## 2020-11-02 ENCOUNTER — Ambulatory Visit
Admission: RE | Admit: 2020-11-02 | Discharge: 2020-11-02 | Disposition: A | Discharge status: Home / Self Care | Payer: Medicare Other | Source: Ambulatory Visit | Attending: Internal Medicine | Admitting: Internal Medicine

## 2020-11-02 DIAGNOSIS — Z1231 Encounter for screening mammogram for malignant neoplasm of breast: Secondary | ICD-10-CM | POA: Diagnosis not present

## 2020-11-14 DIAGNOSIS — Z7901 Long term (current) use of anticoagulants: Secondary | ICD-10-CM | POA: Diagnosis not present

## 2020-11-14 DIAGNOSIS — I480 Paroxysmal atrial fibrillation: Secondary | ICD-10-CM | POA: Diagnosis not present

## 2020-11-24 ENCOUNTER — Other Ambulatory Visit: Payer: Self-pay | Admitting: Cardiology

## 2020-12-19 DIAGNOSIS — I480 Paroxysmal atrial fibrillation: Secondary | ICD-10-CM | POA: Diagnosis not present

## 2020-12-19 DIAGNOSIS — Z7901 Long term (current) use of anticoagulants: Secondary | ICD-10-CM | POA: Diagnosis not present

## 2020-12-23 ENCOUNTER — Other Ambulatory Visit: Payer: Self-pay | Admitting: Cardiology

## 2021-01-15 DIAGNOSIS — Z7901 Long term (current) use of anticoagulants: Secondary | ICD-10-CM | POA: Diagnosis not present

## 2021-01-15 DIAGNOSIS — I480 Paroxysmal atrial fibrillation: Secondary | ICD-10-CM | POA: Diagnosis not present

## 2021-01-23 DIAGNOSIS — M25511 Pain in right shoulder: Secondary | ICD-10-CM | POA: Diagnosis not present

## 2021-02-23 DIAGNOSIS — H6123 Impacted cerumen, bilateral: Secondary | ICD-10-CM | POA: Diagnosis not present

## 2021-03-12 NOTE — Progress Notes (Shared)
[facility] Note  03/20/2021     CHIEF COMPLAINT Patient presents for No chief complaint on file.    HISTORY OF PRESENT ILLNESS: Gloria [name] is a 88 y.o. female who presents to the clinic today for:    Patient states vision the same OU. Primary eye care transferred to Dr. Delman Cheadle following Dr. Elder Negus retirement  Referring physician: [name], MD [place],  [place] [address]  HISTORICAL INFORMATION:   Selected notes from the [place] Referred by Dr. Kathrin Penner LEE: 06-28-19 Ocular Hx- ARMD OU   CURRENT MEDICATIONS: Current Outpatient Medications (Ophthalmic Drugs)  Medication Sig   Polyethyl Glycol-Propyl Glycol (SYSTANE) 0.4-0.3 % SOLN Place 1 drop into both eyes daily as needed.   No current facility-administered medications for this visit. (Ophthalmic Drugs)   Current Outpatient Medications (Other)  Medication Sig   acetaminophen (TYLENOL) 325 MG tablet Take 2 tablets (650 mg total) by mouth every 6 (six) hours as needed for mild pain (or Fever >/= 101).   albuterol (VENTOLIN HFA) 108 (90 Base) MCG/ACT inhaler Inhale 2 puffs into the lungs every 6 (six) hours as needed for wheezing or shortness of breath.   atorvastatin (LIPITOR) 10 MG tablet Take 10 mg by mouth at bedtime.   ergocalciferol (VITAMIN D2) 1.25 MG (50000 UT) capsule Take 50,000 Units by mouth every Friday.   gabapentin (NEURONTIN) 100 MG capsule Take 100 mg by mouth daily as needed (pain).   hydrALAZINE (APRESOLINE) 25 MG tablet TAKE 1 TABLET BY MOUTH THREE TIMES DAILY   hydrocortisone cream 0.5 % Apply 1 application topically 2 (two) times daily. (Patient not taking: Reported on 10/06/2020)   losartan (COZAAR) 25 MG tablet Take 1 tablet by mouth once daily   mometasone-formoterol (DULERA) 100-5 MCG/ACT AERO Inhale 2 puffs into the lungs 2 (two) times daily.   Multiple Vitamins-Minerals (PRESERVISION AREDS PO) Take 1 capsule by mouth  2 (two) times daily.   nitroGLYCERIN (NITROSTAT) 0.4 MG SL tablet Place 1 tablet (0.4 mg total) under the tongue every 5 (five) minutes as needed for chest pain.   omeprazole (PRILOSEC) 40 MG capsule Take 40 mg by mouth 2 (two) times daily.   PROCTO-MED HC 2.5 % rectal cream Apply 1 application topically 2 (two) times daily.   vitamin B-12 (CYANOCOBALAMIN) 1000 MCG tablet Take 1,000 mcg by mouth every evening.    warfarin (COUMADIN) 6 MG tablet Take 3-6 mg by mouth See admin instructions. Take 6 mg by mouth daily on Monday, Wednesday  Take 3 mg by mouth daily on all other days.   zolpidem (AMBIEN) 5 MG tablet Take 5 mg by mouth at bedtime as needed.   No current facility-administered medications for this visit. (Other)   REVIEW OF SYSTEMS:   ALLERGIES Allergies  Allergen Reactions   Amlodipine Swelling   Cephalexin Other (See Comments)    unknown   Codeine Other (See Comments)    unknown   Metoclopramide Hcl Other (See Comments)    unknown   Nitrofuran Derivatives Other (See Comments)    unknown    PAST MEDICAL HISTORY Past Medical History:  Diagnosis Date   Arthritis    right   Atrial fibrillation (HCC)    GERD (gastroesophageal reflux disease)    hx of   Hepatitis C    Humerus fracture    Hyperlipidemia    Hypertension    Irritable bowel syndrome    Meningioma (HCC)    PONV (  postoperative nausea and vomiting)    Past Surgical History:  Procedure Laterality Date   BREAST SURGERY     lumpectomy   CARDIOVASCULAR STRESS TEST  11/14/2006   EF 67%   COLONOSCOPY W/ BIOPSIES AND POLYPECTOMY     ORIF HUMERUS FRACTURE Right 04/18/2014   Procedure: OPEN REDUCTION INTERNAL FIXATION (ORIF) RIGHT PROXIMAL HUMERUS FRACTURE;  Surgeon: Netta Cedars, MD;  Location: [place];  Service: Orthopedics;  Laterality: Right;   ORIF PERIPROSTHETIC FRACTURE Left 04/30/2013   Procedure: OPEN REDUCTION INTERNAL FIXATION (ORIF) PERIPROSTHETIC FRACTURE;  Surgeon: [name], MD;  Location: [place];  Service: Orthopedics;  Laterality: Left;   PAROTID GLAND TUMOR EXCISION     TOTAL HIP ARTHROPLASTY  11/07   left   TOTAL KNEE ARTHROPLASTY     TOTAL KNEE ARTHROPLASTY Left 11/24/2017   Procedure: TOTAL KNEE ARTHROPLASTY;  Surgeon: Gaynelle Arabian, MD;  Location: [facility];  Service: Orthopedics;  Laterality: Left;  43mn   TRANSTHORACIC ECHOCARDIOGRAM  10/25/2009   EF 55-60%    FAMILY HISTORY Family History  Problem Relation Age of Onset   Heart disease Brother    Breast cancer Neg Hx     SOCIAL HISTORY Social History   Tobacco Use   Smoking status: Former    Packs/day: 0.30    Years: 15.00    Pack years: 4.50    Types: Cigarettes    Quit date: 06/07/1980    Years since quitting: 40.7   Smokeless tobacco: Never  Vaping Use   Vaping Use: Never used  Substance Use Topics   Alcohol use: No   Drug use: No         OPHTHALMIC EXAM:  Not recorded    IMAGING AND PROCEDURES  Imaging and Procedures for 03/20/2021          ASSESSMENT/PLAN:    ICD-10-CM   1. Intermediate stage nonexudative age-related macular degeneration of both eyes  H35.3132     2. Fleck corneal dystrophy  H18.599     3. Essential hypertension  I10     4. Hypertensive retinopathy of both eyes  H35.033     5. Pseudophakia of both eyes  Z96.1       1. Age related macular degeneration, non-exudative, OU -- stable  - Intermediate stage with prominent central PEDs OU (OD > OS)  - BCVA stable at 20/50 OD, 20/60 OS  - The incidence, anatomy, and pathology of dry AMD, risk of progression, and the AREDS and AREDS 2 studies including smoking risks discussed with patient.   - cont AREDS 2 supplementation and amsler grid monitoring  - f/u 6 months DFE, OCT  2. Fleck corneal dystrophy OU  - now under the expert management of Dr. GDelman Cheadle - stable  3,4. Hypertensive retinopathy OU - discussed importance of tight BP control - monitor   5. Pseudophakia OU  - s/p CE/IOL OU  - IOLs in good  position, doing well  - monitor    Ophthalmic Meds Ordered this visit:  No orders of the defined types were placed in this encounter.      No follow-ups on file.  There are no Patient Instructions on file for this visit.   Explained the diagnoses, plan, and follow up with the patient and they expressed understanding.  Patient expressed understanding of the importance of proper follow up care.    This document serves as a record of services personally performed by BGardiner Sleeper MD, PhD. It was created  on their behalf by Leonie Douglas, an ophthalmic technician. The creation of this record is the provider's dictation and/or activities during the visit.    Electronically signed by: Leonie Douglas COA, 03/12/21  3:01 PM   Gardiner Sleeper, M.D., Ph.D. Diseases & Surgery of the Retina and Vitreous [facility]: M myopia (nearsighted); A astigmatism; H hyperopia (farsighted); P presbyopia; Mrx spectacle prescription;  CTL contact lenses; OD right eye; OS left eye; OU both eyes  XT exotropia; ET esotropia; PEK punctate epithelial keratitis; PEE punctate epithelial erosions; DES dry eye syndrome; MGD meibomian gland dysfunction; ATs artificial tears; PFAT's preservative free artificial tears; [place] nuclear sclerotic cataract; PSC posterior subcapsular cataract; ERM epi-retinal membrane; PVD posterior vitreous detachment; RD retinal detachment; DM diabetes mellitus; DR diabetic retinopathy; NPDR non-proliferative diabetic retinopathy; PDR proliferative diabetic retinopathy; CSME clinically significant macular edema; DME diabetic macular edema; dbh dot blot hemorrhages; CWS cotton wool spot; POAG primary open angle glaucoma; C/D cup-to-disc ratio; HVF humphrey visual field; GVF goldmann visual field; OCT optical coherence tomography; IOP intraocular pressure; BRVO Branch retinal vein occlusion; CRVO central retinal vein occlusion; CRAO central retinal artery  occlusion; BRAO branch retinal artery occlusion; RT retinal tear; SB scleral buckle; PPV pars plana vitrectomy; VH Vitreous hemorrhage; PRP panretinal laser photocoagulation; IVK intravitreal kenalog; VMT vitreomacular traction; MH Macular hole;  NVD neovascularization of the disc; NVE neovascularization elsewhere; AREDS age related eye disease study; ARMD age related macular degeneration; POAG primary open angle glaucoma; EBMD epithelial/anterior basement membrane dystrophy; ACIOL anterior chamber intraocular lens; IOL intraocular lens; PCIOL posterior chamber intraocular lens; Phaco/IOL phacoemulsification with intraocular lens placement; [place] photorefractive keratectomy; LASIK laser assisted in situ keratomileusis; HTN hypertension; DM diabetes mellitus; COPD chronic obstructive pulmonary disease

## 2021-03-14 DIAGNOSIS — Z7901 Long term (current) use of anticoagulants: Secondary | ICD-10-CM | POA: Diagnosis not present

## 2021-03-14 DIAGNOSIS — I480 Paroxysmal atrial fibrillation: Secondary | ICD-10-CM | POA: Diagnosis not present

## 2021-03-20 ENCOUNTER — Encounter (INDEPENDENT_AMBULATORY_CARE_PROVIDER_SITE_OTHER): Payer: Self-pay | Admitting: Ophthalmology

## 2021-03-20 ENCOUNTER — Other Ambulatory Visit: Payer: Self-pay

## 2021-03-20 ENCOUNTER — Ambulatory Visit (INDEPENDENT_AMBULATORY_CARE_PROVIDER_SITE_OTHER): Payer: Medicare Other | Admitting: Ophthalmology

## 2021-03-20 DIAGNOSIS — H18599 Other hereditary corneal dystrophies, unspecified eye: Secondary | ICD-10-CM | POA: Diagnosis not present

## 2021-03-20 DIAGNOSIS — I10 Essential (primary) hypertension: Secondary | ICD-10-CM

## 2021-03-20 DIAGNOSIS — Z961 Presence of intraocular lens: Secondary | ICD-10-CM

## 2021-03-20 DIAGNOSIS — H35033 Hypertensive retinopathy, bilateral: Secondary | ICD-10-CM

## 2021-03-20 DIAGNOSIS — H353132 Nonexudative age-related macular degeneration, bilateral, intermediate dry stage: Secondary | ICD-10-CM

## 2021-03-22 NOTE — Progress Notes (Signed)
?Cardiology Office Note:   ? ?Date:  03/27/2021  ? ?ID:  Gloria [name], DOB [birth year], MRN [id number] ? ?PCP:  [name], MD ?  ?[facility] Providers ?Cardiologist:  Rayquon Uselman [place], MD    ? ?Referring MD: [name], MD  ? ?Chief Complaint  ?Patient presents with  ? Follow-up  ?  6 months. ?  ? Edema  ?  Ankles.  ? Shortness of Breath  ? Atrial Fibrillation  ? ? ? ?History of Present Illness:   ? ?Gloria [name] is a 88 y.o. female with a hx of hypertension, hyperlipidemia, GERD and PAF.  Patient was admitted in May 2018 with chest pain and shortness of breath, serial troponin was negative.  Echocardiogram showed moderate LVH, LVEF with normal systolic function.  Myoview was normal.  She had lower extremity swelling and a rash with amlodipine.  Carotid Doppler in September 2020 showed less than 39% stenosis bilaterally.  More recently, she was tested positive for COVID on 06/01/2020.  Patient presented to the hospital on 06/16/2020 with dizziness.  On admission she was bradycardic, flecainide was held.  Blood pressure was over 200 in the hospital.  HCTZ was discontinued and she was started on hydralazine.  MRI was negative for acute abnormality.  CTA of the head and neck was negative for acute abnormality as well.  Patient was assessed by PT for vestibular evaluation noted to have posterior left head roll and was treated with Epley maneuver.  She was hydrated.  Echocardiogram obtained on 06/17/2020 showed EF 60 to 65%, grade 2 DD, mild to moderate aortic valve sclerosis without evidence of aortic stenosis, mild dilatation of the aortic root measuring at 38 mm. ? ?She was seen in follow up on 06/23/20 by Almyra Deforest PA-C and her Dizziness had improved but she still has occasional dizziness.  Her BP was elevated and hydralazine dose was increased to tid. She had some palpitations but deferred further evaluation at that time.  ? ?On follow up today she is doing well. She reports she had a couple of  episodes of palpitations in Jan. Noted rapid heart beat and skipping. Both occurred at rest and lasted less than 30 minutes. None since then. Coumadin has been checking out ok.  ? ?Past Medical History:  ?Diagnosis Date  ? Arthritis   ? right  ? Atrial fibrillation ([place])   ? GERD (gastroesophageal reflux disease)   ? hx of  ? Hepatitis C   ? Humerus fracture   ? Hyperlipidemia   ? Hypertension   ? Irritable bowel syndrome   ? Meningioma ([place])   ? PONV (postoperative nausea and vomiting)   ? ? ?Past Surgical History:  ?Procedure Laterality Date  ? BREAST SURGERY    ? lumpectomy  ? CARDIOVASCULAR STRESS TEST  11/14/2006  ? EF 67%  ? COLONOSCOPY W/ BIOPSIES AND POLYPECTOMY    ? ORIF HUMERUS FRACTURE Right 04/18/2014  ? Procedure: OPEN REDUCTION INTERNAL FIXATION (ORIF) RIGHT PROXIMAL HUMERUS FRACTURE;  Surgeon: Netta Cedars, MD;  Location: [place];  Service: Orthopedics;  Laterality: Right;  ? ORIF PERIPROSTHETIC FRACTURE Left 04/30/2013  ? Procedure: OPEN REDUCTION INTERNAL FIXATION (ORIF) PERIPROSTHETIC FRACTURE;  Surgeon: [name], MD;  Location: [place];  Service: Orthopedics;  Laterality: Left;  ? PAROTID GLAND TUMOR EXCISION    ? TOTAL HIP ARTHROPLASTY  11/07  ? left  ? TOTAL KNEE ARTHROPLASTY    ? TOTAL KNEE ARTHROPLASTY Left 11/24/2017  ? Procedure: TOTAL KNEE  ARTHROPLASTY;  Surgeon: Gaynelle Arabian, MD;  Location[facility];  Service: Orthopedics;  Laterality: Left;  45mn  ? TRANSTHORACIC ECHOCARDIOGRAM  10/25/2009  ? EF 55-60%  ? ? ?Current Medications: ?Current Meds  ?Medication Sig  ? acetaminophen (TYLENOL) 325 MG tablet Take 2 tablets (650 mg total) by mouth every 6 (six) hours as needed for mild pain (or Fever >/= 101).  ? albuterol (VENTOLIN HFA) 108 (90 Base) MCG/ACT inhaler Inhale 2 puffs into the lungs every 6 (six) hours as needed for wheezing or shortness of breath.  ? atorvastatin (LIPITOR) 10 MG tablet Take 10 mg by mouth at bedtime.  ? ergocalciferol (VITAMIN D2) 1.25 MG (50000 UT) capsule Take  50,000 Units by mouth every Friday.  ? gabapentin (NEURONTIN) 100 MG capsule Take 100 mg by mouth daily as needed (pain).  ? hydrALAZINE (APRESOLINE) 25 MG tablet TAKE 1 TABLET BY MOUTH THREE TIMES DAILY  ? losartan (COZAAR) 25 MG tablet Take 1 tablet by mouth once daily  ? mometasone-formoterol (DULERA) 100-5 MCG/ACT AERO Inhale 2 puffs into the lungs 2 (two) times daily.  ? Multiple Vitamins-Minerals (PRESERVISION AREDS PO) Take 1 capsule by mouth 2 (two) times daily.  ? nitroGLYCERIN (NITROSTAT) 0.4 MG SL tablet Place 1 tablet (0.4 mg total) under the tongue every 5 (five) minutes as needed for chest pain.  ? omeprazole (PRILOSEC) 40 MG capsule Take 40 mg by mouth 2 (two) times daily.  ? Polyethyl Glycol-Propyl Glycol (SYSTANE) 0.4-0.3 % SOLN Place 1 drop into both eyes daily as needed.  ? PROCTO-MED HC 2.5 % rectal cream Apply 1 application topically 2 (two) times daily.  ? vitamin B-12 (CYANOCOBALAMIN) 1000 MCG tablet Take 1,000 mcg by mouth every evening.   ? warfarin (COUMADIN) 6 MG tablet Take 3-6 mg by mouth See admin instructions. Take 6 mg by mouth daily on Monday, Wednesday  ?Take 3 mg by mouth daily on all other days.  ? zolpidem (AMBIEN) 5 MG tablet Take 5 mg by mouth at bedtime as needed.  ? [DISCONTINUED] hydrocortisone cream 0.5 % Apply 1 application. topically 2 (two) times daily.  ?  ? ?Allergies:   Amlodipine, Cephalexin, Codeine, Metoclopramide hcl, and Nitrofuran derivatives  ? ?Social History  ? ?Socioeconomic History  ? Marital status: Widowed  ?  Spouse name: Not on file  ? Number of children: 2  ? Years of education: Not on file  ? Highest education level: Not on file  ?Occupational History  ?  Employer: RETIRED  ?Tobacco Use  ? Smoking status: Former  ?  Packs/day: 0.30  ?  Years: 15.00  ?  Pack years: 4.50  ?  Types: Cigarettes  ?  Quit date: 06/07/1980  ?  Years since quitting: 40.8  ? Smokeless tobacco: Never  ?Vaping Use  ? Vaping Use: Never used  ?Substance and Sexual Activity  ?  Alcohol use: No  ? Drug use: No  ? Sexual activity: Never  ?Other Topics Concern  ? Not on file  ?Social History Narrative  ? Not on file  ? ?Social Determinants of Health  ? ?Financial Resource Strain: Not on file  ?Food Insecurity: Not on file  ?Transportation Needs: Not on file  ?Physical Activity: Not on file  ?Stress: Not on file  ?Social Connections: Not on file  ?  ? ?Family History: ?The patient's family history includes Heart disease in her brother. There is no history of Breast cancer. ? ?ROS:   ?Please see the history of present illness.    ?  All other systems reviewed and are negative. ? ?EKGs/Labs/Other Studies Reviewed:   ? ?The following studies were reviewed today: ? ?Echo 06/17/2020 ? 1. Left ventricular ejection fraction, by estimation, is 60 to 65%. The  ?left ventricle has normal function. The left ventricle has no regional  ?wall motion abnormalities. There is mild concentric left ventricular  ?hypertrophy. Left ventricular diastolic  ?parameters are consistent with Grade II diastolic dysfunction  ?(pseudonormalization). Elevated left ventricular end-diastolic pressure.  ? 2. Right ventricular systolic function is normal. The right ventricular  ?size is normal. Tricuspid regurgitation signal is inadequate for assessing  ?PA pressure.  ? 3. Left atrial size was severely dilated.  ? 4. The mitral valve is normal in structure. Trivial mitral valve  ?regurgitation. No evidence of mitral stenosis.  ? 5. The aortic valve is tricuspid. Aortic valve regurgitation is not  ?visualized. Mild to moderate aortic valve sclerosis/calcification is  ?present, without any evidence of aortic stenosis. Aortic valve area, by  ?VTI measures 1.82 cm?Marland Kitchen Aortic valve mean  ?gradient measures 9.0 mmHg. Aortic valve Vmax measures 2.08 m/s.  ? 6. Aortic dilatation noted. There is mild dilatation of the aortic root,  ?measuring 38 mm.  ? 7. The inferior vena cava is normal in size with greater than 50%  ?respiratory  variability, suggesting right atrial pressure of 3 mmHg.  ? ?EKG:  EKG is not ordered today.   ? ?Recent Labs: ?06/16/2020: ALT 14; B Natriuretic Peptide 192.4 ?06/18/2020: BUN 13; Creatinine, Ser 0.76; Hemoglobin 12.7; [place]

## 2021-03-27 ENCOUNTER — Ambulatory Visit (INDEPENDENT_AMBULATORY_CARE_PROVIDER_SITE_OTHER): Payer: Medicare Other | Admitting: Cardiology

## 2021-03-27 ENCOUNTER — Other Ambulatory Visit: Payer: Self-pay

## 2021-03-27 ENCOUNTER — Encounter: Payer: Self-pay | Admitting: Cardiology

## 2021-03-27 VITALS — BP 130/84 | HR 62 | Ht 65.0 in | Wt 147.0 lb

## 2021-03-27 DIAGNOSIS — E785 Hyperlipidemia, unspecified: Secondary | ICD-10-CM | POA: Diagnosis not present

## 2021-03-27 DIAGNOSIS — I10 Essential (primary) hypertension: Secondary | ICD-10-CM | POA: Diagnosis not present

## 2021-03-27 DIAGNOSIS — I480 Paroxysmal atrial fibrillation: Secondary | ICD-10-CM | POA: Diagnosis not present

## 2021-04-25 DIAGNOSIS — Z7901 Long term (current) use of anticoagulants: Secondary | ICD-10-CM | POA: Diagnosis not present

## 2021-04-25 DIAGNOSIS — I480 Paroxysmal atrial fibrillation: Secondary | ICD-10-CM | POA: Diagnosis not present

## 2021-04-25 DIAGNOSIS — B001 Herpesviral vesicular dermatitis: Secondary | ICD-10-CM | POA: Diagnosis not present

## 2021-05-10 DIAGNOSIS — M859 Disorder of bone density and structure, unspecified: Secondary | ICD-10-CM | POA: Diagnosis not present

## 2021-05-10 DIAGNOSIS — E785 Hyperlipidemia, unspecified: Secondary | ICD-10-CM | POA: Diagnosis not present

## 2021-05-10 DIAGNOSIS — I10 Essential (primary) hypertension: Secondary | ICD-10-CM | POA: Diagnosis not present

## 2021-05-17 DIAGNOSIS — Z7901 Long term (current) use of anticoagulants: Secondary | ICD-10-CM | POA: Diagnosis not present

## 2021-05-17 DIAGNOSIS — I480 Paroxysmal atrial fibrillation: Secondary | ICD-10-CM | POA: Diagnosis not present

## 2021-05-17 DIAGNOSIS — R82998 Other abnormal findings in urine: Secondary | ICD-10-CM | POA: Diagnosis not present

## 2021-05-17 DIAGNOSIS — Z0000 Encounter for general adult medical examination without abnormal findings: Secondary | ICD-10-CM | POA: Diagnosis not present

## 2021-05-17 DIAGNOSIS — Z1331 Encounter for screening for depression: Secondary | ICD-10-CM | POA: Diagnosis not present

## 2021-05-17 DIAGNOSIS — Z1339 Encounter for screening examination for other mental health and behavioral disorders: Secondary | ICD-10-CM | POA: Diagnosis not present

## 2021-05-17 DIAGNOSIS — E785 Hyperlipidemia, unspecified: Secondary | ICD-10-CM | POA: Diagnosis not present

## 2021-05-17 DIAGNOSIS — I10 Essential (primary) hypertension: Secondary | ICD-10-CM | POA: Diagnosis not present

## 2021-06-09 DIAGNOSIS — S0990XA Unspecified injury of head, initial encounter: Secondary | ICD-10-CM | POA: Diagnosis not present

## 2021-06-09 DIAGNOSIS — R262 Difficulty in walking, not elsewhere classified: Secondary | ICD-10-CM | POA: Diagnosis not present

## 2021-06-09 DIAGNOSIS — S8991XA Unspecified injury of right lower leg, initial encounter: Secondary | ICD-10-CM | POA: Diagnosis not present

## 2021-06-09 DIAGNOSIS — S79921A Unspecified injury of right thigh, initial encounter: Secondary | ICD-10-CM | POA: Diagnosis not present

## 2021-06-09 DIAGNOSIS — Z8679 Personal history of other diseases of the circulatory system: Secondary | ICD-10-CM | POA: Diagnosis not present

## 2021-06-09 DIAGNOSIS — I480 Paroxysmal atrial fibrillation: Secondary | ICD-10-CM | POA: Diagnosis not present

## 2021-06-09 DIAGNOSIS — M2550 Pain in unspecified joint: Secondary | ICD-10-CM | POA: Diagnosis not present

## 2021-06-09 DIAGNOSIS — S3991XA Unspecified injury of abdomen, initial encounter: Secondary | ICD-10-CM | POA: Diagnosis not present

## 2021-06-09 DIAGNOSIS — Z96643 Presence of artificial hip joint, bilateral: Secondary | ICD-10-CM | POA: Diagnosis not present

## 2021-06-09 DIAGNOSIS — S199XXA Unspecified injury of neck, initial encounter: Secondary | ICD-10-CM | POA: Diagnosis not present

## 2021-06-09 DIAGNOSIS — R079 Chest pain, unspecified: Secondary | ICD-10-CM | POA: Diagnosis not present

## 2021-06-09 DIAGNOSIS — W1830XA Fall on same level, unspecified, initial encounter: Secondary | ICD-10-CM | POA: Diagnosis not present

## 2021-06-09 DIAGNOSIS — I951 Orthostatic hypotension: Secondary | ICD-10-CM | POA: Diagnosis not present

## 2021-06-09 DIAGNOSIS — S299XXA Unspecified injury of thorax, initial encounter: Secondary | ICD-10-CM | POA: Diagnosis not present

## 2021-06-09 DIAGNOSIS — Z96653 Presence of artificial knee joint, bilateral: Secondary | ICD-10-CM | POA: Diagnosis not present

## 2021-06-09 DIAGNOSIS — I440 Atrioventricular block, first degree: Secondary | ICD-10-CM | POA: Diagnosis not present

## 2021-06-09 DIAGNOSIS — M25552 Pain in left hip: Secondary | ICD-10-CM | POA: Diagnosis not present

## 2021-06-09 DIAGNOSIS — Y939 Activity, unspecified: Secondary | ICD-10-CM | POA: Diagnosis not present

## 2021-06-09 DIAGNOSIS — S99911A Unspecified injury of right ankle, initial encounter: Secondary | ICD-10-CM | POA: Diagnosis not present

## 2021-06-09 DIAGNOSIS — G629 Polyneuropathy, unspecified: Secondary | ICD-10-CM | POA: Diagnosis not present

## 2021-06-09 DIAGNOSIS — Q670 Congenital facial asymmetry: Secondary | ICD-10-CM | POA: Diagnosis not present

## 2021-06-09 DIAGNOSIS — M25511 Pain in right shoulder: Secondary | ICD-10-CM | POA: Diagnosis not present

## 2021-06-09 DIAGNOSIS — M25561 Pain in right knee: Secondary | ICD-10-CM | POA: Diagnosis not present

## 2021-06-09 DIAGNOSIS — S4991XA Unspecified injury of right shoulder and upper arm, initial encounter: Secondary | ICD-10-CM | POA: Diagnosis not present

## 2021-06-09 DIAGNOSIS — Z7901 Long term (current) use of anticoagulants: Secondary | ICD-10-CM | POA: Diagnosis not present

## 2021-06-09 DIAGNOSIS — S3993XA Unspecified injury of pelvis, initial encounter: Secondary | ICD-10-CM | POA: Diagnosis not present

## 2021-06-09 DIAGNOSIS — W19XXXA Unspecified fall, initial encounter: Secondary | ICD-10-CM | POA: Diagnosis not present

## 2021-06-09 DIAGNOSIS — R2689 Other abnormalities of gait and mobility: Secondary | ICD-10-CM | POA: Diagnosis not present

## 2021-06-09 DIAGNOSIS — M25559 Pain in unspecified hip: Secondary | ICD-10-CM | POA: Diagnosis not present

## 2021-06-09 DIAGNOSIS — S59901A Unspecified injury of right elbow, initial encounter: Secondary | ICD-10-CM | POA: Diagnosis not present

## 2021-06-09 DIAGNOSIS — M25551 Pain in right hip: Secondary | ICD-10-CM | POA: Diagnosis not present

## 2021-06-09 DIAGNOSIS — R55 Syncope and collapse: Secondary | ICD-10-CM | POA: Diagnosis not present

## 2021-06-09 DIAGNOSIS — M25571 Pain in right ankle and joints of right foot: Secondary | ICD-10-CM | POA: Diagnosis not present

## 2021-06-09 DIAGNOSIS — R011 Cardiac murmur, unspecified: Secondary | ICD-10-CM | POA: Diagnosis not present

## 2021-06-09 DIAGNOSIS — I10 Essential (primary) hypertension: Secondary | ICD-10-CM | POA: Diagnosis not present

## 2021-06-09 DIAGNOSIS — I350 Nonrheumatic aortic (valve) stenosis: Secondary | ICD-10-CM | POA: Diagnosis not present

## 2021-06-09 DIAGNOSIS — I361 Nonrheumatic tricuspid (valve) insufficiency: Secondary | ICD-10-CM | POA: Diagnosis not present

## 2021-06-09 DIAGNOSIS — S79911A Unspecified injury of right hip, initial encounter: Secondary | ICD-10-CM | POA: Diagnosis not present

## 2021-06-09 DIAGNOSIS — R0902 Hypoxemia: Secondary | ICD-10-CM | POA: Diagnosis not present

## 2021-06-10 DIAGNOSIS — I6522 Occlusion and stenosis of left carotid artery: Secondary | ICD-10-CM | POA: Diagnosis not present

## 2021-06-10 DIAGNOSIS — Q670 Congenital facial asymmetry: Secondary | ICD-10-CM | POA: Diagnosis not present

## 2021-06-10 DIAGNOSIS — M25571 Pain in right ankle and joints of right foot: Secondary | ICD-10-CM | POA: Diagnosis not present

## 2021-06-10 DIAGNOSIS — Y939 Activity, unspecified: Secondary | ICD-10-CM | POA: Diagnosis not present

## 2021-06-10 DIAGNOSIS — M25561 Pain in right knee: Secondary | ICD-10-CM | POA: Diagnosis not present

## 2021-06-10 DIAGNOSIS — I350 Nonrheumatic aortic (valve) stenosis: Secondary | ICD-10-CM | POA: Diagnosis not present

## 2021-06-10 DIAGNOSIS — M25511 Pain in right shoulder: Secondary | ICD-10-CM | POA: Diagnosis not present

## 2021-06-10 DIAGNOSIS — I440 Atrioventricular block, first degree: Secondary | ICD-10-CM | POA: Diagnosis not present

## 2021-06-10 DIAGNOSIS — I480 Paroxysmal atrial fibrillation: Secondary | ICD-10-CM | POA: Diagnosis not present

## 2021-06-10 DIAGNOSIS — M25551 Pain in right hip: Secondary | ICD-10-CM | POA: Diagnosis not present

## 2021-06-10 DIAGNOSIS — W19XXXA Unspecified fall, initial encounter: Secondary | ICD-10-CM | POA: Diagnosis not present

## 2021-06-10 DIAGNOSIS — R262 Difficulty in walking, not elsewhere classified: Secondary | ICD-10-CM | POA: Diagnosis not present

## 2021-06-10 DIAGNOSIS — R55 Syncope and collapse: Secondary | ICD-10-CM | POA: Diagnosis not present

## 2021-06-11 DIAGNOSIS — Y939 Activity, unspecified: Secondary | ICD-10-CM | POA: Diagnosis not present

## 2021-06-11 DIAGNOSIS — W19XXXA Unspecified fall, initial encounter: Secondary | ICD-10-CM | POA: Diagnosis not present

## 2021-06-11 DIAGNOSIS — M25561 Pain in right knee: Secondary | ICD-10-CM | POA: Diagnosis not present

## 2021-06-11 DIAGNOSIS — Q670 Congenital facial asymmetry: Secondary | ICD-10-CM | POA: Diagnosis not present

## 2021-06-11 DIAGNOSIS — I350 Nonrheumatic aortic (valve) stenosis: Secondary | ICD-10-CM | POA: Diagnosis not present

## 2021-06-11 DIAGNOSIS — R262 Difficulty in walking, not elsewhere classified: Secondary | ICD-10-CM | POA: Diagnosis not present

## 2021-06-11 DIAGNOSIS — M25571 Pain in right ankle and joints of right foot: Secondary | ICD-10-CM | POA: Diagnosis not present

## 2021-06-11 DIAGNOSIS — M25551 Pain in right hip: Secondary | ICD-10-CM | POA: Diagnosis not present

## 2021-06-11 DIAGNOSIS — R55 Syncope and collapse: Secondary | ICD-10-CM | POA: Diagnosis not present

## 2021-06-11 DIAGNOSIS — M25511 Pain in right shoulder: Secondary | ICD-10-CM | POA: Diagnosis not present

## 2021-06-11 DIAGNOSIS — I480 Paroxysmal atrial fibrillation: Secondary | ICD-10-CM | POA: Diagnosis not present

## 2021-06-11 DIAGNOSIS — I440 Atrioventricular block, first degree: Secondary | ICD-10-CM | POA: Diagnosis not present

## 2021-06-12 DIAGNOSIS — R55 Syncope and collapse: Secondary | ICD-10-CM | POA: Diagnosis not present

## 2021-06-12 DIAGNOSIS — R262 Difficulty in walking, not elsewhere classified: Secondary | ICD-10-CM | POA: Diagnosis not present

## 2021-06-12 DIAGNOSIS — M25551 Pain in right hip: Secondary | ICD-10-CM | POA: Diagnosis not present

## 2021-06-12 DIAGNOSIS — M25571 Pain in right ankle and joints of right foot: Secondary | ICD-10-CM | POA: Diagnosis not present

## 2021-06-12 DIAGNOSIS — I350 Nonrheumatic aortic (valve) stenosis: Secondary | ICD-10-CM | POA: Diagnosis not present

## 2021-06-12 DIAGNOSIS — Q670 Congenital facial asymmetry: Secondary | ICD-10-CM | POA: Diagnosis not present

## 2021-06-12 DIAGNOSIS — M25511 Pain in right shoulder: Secondary | ICD-10-CM | POA: Diagnosis not present

## 2021-06-12 DIAGNOSIS — M25561 Pain in right knee: Secondary | ICD-10-CM | POA: Diagnosis not present

## 2021-06-12 DIAGNOSIS — I440 Atrioventricular block, first degree: Secondary | ICD-10-CM | POA: Diagnosis not present

## 2021-06-12 DIAGNOSIS — I480 Paroxysmal atrial fibrillation: Secondary | ICD-10-CM | POA: Diagnosis not present

## 2021-06-12 DIAGNOSIS — W19XXXA Unspecified fall, initial encounter: Secondary | ICD-10-CM | POA: Diagnosis not present

## 2021-06-12 DIAGNOSIS — Y939 Activity, unspecified: Secondary | ICD-10-CM | POA: Diagnosis not present

## 2021-06-13 DIAGNOSIS — M25511 Pain in right shoulder: Secondary | ICD-10-CM | POA: Diagnosis not present

## 2021-06-13 DIAGNOSIS — I440 Atrioventricular block, first degree: Secondary | ICD-10-CM | POA: Diagnosis not present

## 2021-06-13 DIAGNOSIS — W19XXXA Unspecified fall, initial encounter: Secondary | ICD-10-CM | POA: Diagnosis not present

## 2021-06-13 DIAGNOSIS — Y939 Activity, unspecified: Secondary | ICD-10-CM | POA: Diagnosis not present

## 2021-06-13 DIAGNOSIS — M25551 Pain in right hip: Secondary | ICD-10-CM | POA: Diagnosis not present

## 2021-06-13 DIAGNOSIS — R262 Difficulty in walking, not elsewhere classified: Secondary | ICD-10-CM | POA: Diagnosis not present

## 2021-06-13 DIAGNOSIS — M25571 Pain in right ankle and joints of right foot: Secondary | ICD-10-CM | POA: Diagnosis not present

## 2021-06-13 DIAGNOSIS — M25561 Pain in right knee: Secondary | ICD-10-CM | POA: Diagnosis not present

## 2021-06-13 DIAGNOSIS — Q670 Congenital facial asymmetry: Secondary | ICD-10-CM | POA: Diagnosis not present

## 2021-06-13 DIAGNOSIS — I480 Paroxysmal atrial fibrillation: Secondary | ICD-10-CM | POA: Diagnosis not present

## 2021-06-13 DIAGNOSIS — R55 Syncope and collapse: Secondary | ICD-10-CM | POA: Diagnosis not present

## 2021-06-13 DIAGNOSIS — I350 Nonrheumatic aortic (valve) stenosis: Secondary | ICD-10-CM | POA: Diagnosis not present

## 2021-06-14 DIAGNOSIS — M25571 Pain in right ankle and joints of right foot: Secondary | ICD-10-CM | POA: Diagnosis not present

## 2021-06-14 DIAGNOSIS — I350 Nonrheumatic aortic (valve) stenosis: Secondary | ICD-10-CM | POA: Diagnosis not present

## 2021-06-14 DIAGNOSIS — I480 Paroxysmal atrial fibrillation: Secondary | ICD-10-CM | POA: Diagnosis not present

## 2021-06-14 DIAGNOSIS — Y939 Activity, unspecified: Secondary | ICD-10-CM | POA: Diagnosis not present

## 2021-06-14 DIAGNOSIS — W19XXXA Unspecified fall, initial encounter: Secondary | ICD-10-CM | POA: Diagnosis not present

## 2021-06-14 DIAGNOSIS — R262 Difficulty in walking, not elsewhere classified: Secondary | ICD-10-CM | POA: Diagnosis not present

## 2021-06-14 DIAGNOSIS — M25561 Pain in right knee: Secondary | ICD-10-CM | POA: Diagnosis not present

## 2021-06-14 DIAGNOSIS — M25511 Pain in right shoulder: Secondary | ICD-10-CM | POA: Diagnosis not present

## 2021-06-14 DIAGNOSIS — M25551 Pain in right hip: Secondary | ICD-10-CM | POA: Diagnosis not present

## 2021-06-14 DIAGNOSIS — R55 Syncope and collapse: Secondary | ICD-10-CM | POA: Diagnosis not present

## 2021-06-14 DIAGNOSIS — I440 Atrioventricular block, first degree: Secondary | ICD-10-CM | POA: Diagnosis not present

## 2021-06-14 DIAGNOSIS — Q670 Congenital facial asymmetry: Secondary | ICD-10-CM | POA: Diagnosis not present

## 2021-06-15 DIAGNOSIS — I350 Nonrheumatic aortic (valve) stenosis: Secondary | ICD-10-CM | POA: Diagnosis not present

## 2021-06-15 DIAGNOSIS — W19XXXA Unspecified fall, initial encounter: Secondary | ICD-10-CM | POA: Diagnosis not present

## 2021-06-15 DIAGNOSIS — Q670 Congenital facial asymmetry: Secondary | ICD-10-CM | POA: Diagnosis not present

## 2021-06-15 DIAGNOSIS — M25561 Pain in right knee: Secondary | ICD-10-CM | POA: Diagnosis not present

## 2021-06-15 DIAGNOSIS — I440 Atrioventricular block, first degree: Secondary | ICD-10-CM | POA: Diagnosis not present

## 2021-06-15 DIAGNOSIS — R55 Syncope and collapse: Secondary | ICD-10-CM | POA: Diagnosis not present

## 2021-06-15 DIAGNOSIS — R262 Difficulty in walking, not elsewhere classified: Secondary | ICD-10-CM | POA: Diagnosis not present

## 2021-06-15 DIAGNOSIS — M25571 Pain in right ankle and joints of right foot: Secondary | ICD-10-CM | POA: Diagnosis not present

## 2021-06-15 DIAGNOSIS — M25511 Pain in right shoulder: Secondary | ICD-10-CM | POA: Diagnosis not present

## 2021-06-15 DIAGNOSIS — Y939 Activity, unspecified: Secondary | ICD-10-CM | POA: Diagnosis not present

## 2021-06-15 DIAGNOSIS — M25551 Pain in right hip: Secondary | ICD-10-CM | POA: Diagnosis not present

## 2021-06-15 DIAGNOSIS — I480 Paroxysmal atrial fibrillation: Secondary | ICD-10-CM | POA: Diagnosis not present

## 2021-06-27 DIAGNOSIS — R2681 Unsteadiness on feet: Secondary | ICD-10-CM | POA: Diagnosis not present

## 2021-06-27 DIAGNOSIS — R55 Syncope and collapse: Secondary | ICD-10-CM | POA: Diagnosis not present

## 2021-06-27 DIAGNOSIS — Z7901 Long term (current) use of anticoagulants: Secondary | ICD-10-CM | POA: Diagnosis not present

## 2021-06-27 DIAGNOSIS — G459 Transient cerebral ischemic attack, unspecified: Secondary | ICD-10-CM | POA: Diagnosis not present

## 2021-06-27 DIAGNOSIS — N183 Chronic kidney disease, stage 3 unspecified: Secondary | ICD-10-CM | POA: Diagnosis not present

## 2021-06-27 DIAGNOSIS — I1310 Hypertensive heart and chronic kidney disease without heart failure, with stage 1 through stage 4 chronic kidney disease, or unspecified chronic kidney disease: Secondary | ICD-10-CM | POA: Diagnosis not present

## 2021-06-27 DIAGNOSIS — I480 Paroxysmal atrial fibrillation: Secondary | ICD-10-CM | POA: Diagnosis not present

## 2021-07-04 ENCOUNTER — Encounter: Payer: Self-pay | Admitting: Physician Assistant

## 2021-07-04 ENCOUNTER — Ambulatory Visit (INDEPENDENT_AMBULATORY_CARE_PROVIDER_SITE_OTHER): Payer: Medicare Other | Admitting: Physician Assistant

## 2021-07-04 ENCOUNTER — Ambulatory Visit (INDEPENDENT_AMBULATORY_CARE_PROVIDER_SITE_OTHER): Payer: Medicare Other

## 2021-07-04 VITALS — BP 153/80 | HR 52 | Ht 65.0 in | Wt 141.8 lb

## 2021-07-04 DIAGNOSIS — R55 Syncope and collapse: Secondary | ICD-10-CM

## 2021-07-04 DIAGNOSIS — I480 Paroxysmal atrial fibrillation: Secondary | ICD-10-CM

## 2021-07-04 DIAGNOSIS — E785 Hyperlipidemia, unspecified: Secondary | ICD-10-CM

## 2021-07-04 DIAGNOSIS — I10 Essential (primary) hypertension: Secondary | ICD-10-CM | POA: Diagnosis not present

## 2021-07-04 NOTE — Patient Instructions (Signed)
Medication Instructions:  NO CHANGES  *If you need a refill on your cardiac medications before your next appointment, please call your pharmacy*   Lab Work: BMET today   If you have labs (blood work) drawn today and your tests are completely normal, you will receive your results only by: [place] (if you have MyChart) OR A paper copy in the mail If you have any lab test that is abnormal or we need to change your treatment, we will call you to review the results.   Testing/Procedures: [place] [facility] has requested you wear a ZIO patch monitor for 14 days.  This is a single patch monitor. [employer] supplies one patch monitor per enrollment. Additional stickers are not available. Please do not apply patch if you will be having a Nuclear Stress Test,  Echocardiogram, Cardiac CT, MRI, or Chest Xray during the period you would be wearing the  monitor. The patch cannot be worn during these tests. You cannot remove and re-apply the  ZIO XT patch monitor.  Your ZIO patch monitor will be mailed 3 day USPS to your address on file. It may take 3-5 days  to receive your monitor after you have been enrolled.  Once you have received your monitor, please review the enclosed instructions. Your monitor  has already been registered assigning a specific monitor serial # to you.  Billing and Patient Assistance Program Information  We have supplied [employer] with any of your insurance information on file for billing purposes. [employer] offers a sliding scale Patient Assistance Program for patients that do not have  insurance, or whose insurance does not completely cover the cost of the ZIO monitor.  You must apply for the Patient Assistance Program to qualify for this discounted rate.  To apply, please call [employer] at [phone], select option 4, select option 2, ask to apply for  Patient Assistance Program. Theodore Demark will ask your household income, and how many people   are in your household. They will quote your out-of-pocket cost based on that information.  [employer] will also be able to set up a 28-month interest-free payment plan if needed.  Applying the monitor   Shave hair from upper left chest.  Hold abrader disc by orange tab. Rub abrader in 40 strokes over the upper left chest as  indicated in your monitor instructions.  Clean area with 4 enclosed alcohol pads. Let dry.  Apply patch as indicated in monitor instructions. Patch will be placed under collarbone on left  side of chest with arrow pointing upward.  Rub patch adhesive wings for 2 minutes. Remove white label marked "1". Remove the white  label marked "2". Rub patch adhesive wings for 2 additional minutes.  While looking in a mirror, press and release button in center of patch. A small green light will  flash 3-4 times. This will be your only indicator that the monitor has been turned on.  Do not shower for the first 24 hours. You may shower after the first 24 hours.  Press the button if you feel a symptom. You will hear a small click. Record Date, Time and  Symptom in the Patient Logbook.  When you are ready to remove the patch, follow instructions on the last 2 pages of Patient  Logbook. Stick patch monitor onto the last page of Patient Logbook.  Place Patient Logbook in the blue and white box. Use locking tab on box and tape box closed  securely. The blue  and white box has prepaid postage on it. Please place it in the mailbox as  soon as possible. Your physician should have your test results approximately 7 days after the  monitor has been mailed back to [facility].  Call [place] at [phone] if you have questions regarding  your ZIO XT patch monitor. Call them immediately if you see an orange light blinking on your  monitor.  If your monitor falls off in less than 4 days, contact our Monitor department at [phone].  If your monitor becomes loose or  falls off after 4 days call [employer] at [phone] for  suggestions on securing your monitor    Follow-Up: At [facility], you and your health needs are our priority.  As part of our continuing mission to provide you with exceptional heart care, we have created designated Provider Care Teams.  These Care Teams include your primary Cardiologist (physician) and Advanced Practice Providers (APPs -  Physician Assistants and Nurse Practitioners) who all work together to provide you with the care you need, when you need it.  We recommend signing up for the patient portal called "MyChart".  Sign up information is provided on this After Visit Summary.  MyChart is used to connect with patients for Virtual Visits (Telemedicine).  Patients are able to view lab/test results, encounter notes, upcoming appointments, etc.  Non-urgent messages can be sent to your provider as well.   To learn more about what you can do with MyChart, go to [email or web address].    Your next appointment:   6 week(s)  The format for your next appointment:   In Person  Provider:   Almyra Deforest PA

## 2021-07-04 NOTE — Progress Notes (Unsigned)
Enrolled patient for a 14 day Zio XT monitor to be mailed to patients home   Dr Jordan to read 

## 2021-07-04 NOTE — Progress Notes (Signed)
Cardiology Office Note:    Date:  07/06/2021   ID:  Gloria [name], DOB 08-07-1930, MRN [id number]  PCP:  [name], MD   [facility] Providers Cardiologist:  Peter Martinique, MD     Referring MD: [name], MD   Chief Complaint  Patient presents with   Follow-up    Seen for Dr. Martinique    History of Present Illness:    Gloria [name] is a 88 y.o. female with a hx of hypertension, hyperlipidemia, GERD and PAF.  Patient was admitted in May 2018 with chest pain and shortness of breath.  Serial enzymes was negative.  Echocardiogram showed moderate LVH, normal EF.  Myoview was normal.  She had a lower extremity swelling and rash with amlodipine.  Carotid Doppler in September 2020 showed less than 39% disease bilaterally.  She was tested positive for COVID in May 2022 and presented to the hospital in June 2020 for dizziness.  During the admission, patient was bradycardic, flecainide was held.  Systolic blood pressure was over 200.  Hydrochlorothiazide was discontinued and she was started on hydralazine.  MRI showed no acute abnormality.  A CTA of the head and neck was negative for acute abnormality as well.  Patient was assessed by PT for vestibular evaluation and was treated with Epley maneuver.  She was hydrated.  Echocardiogram obtained in June 2022 showed EF 60 to 65%, grade 2 DD, mild to moderate aortic valve sclerosis without evidence of aortic stenosis, mild dilatation of the aortic root measuring at 38 mm.  She was last seen by Dr. Martinique in March 2023 at which time she was doing well.  Patient presents today for posthospital follow-up.  [place] her [place] earlier this month when she had a near passing out spell.  She denies any loss of consciousness.  Symptoms occurred roughly around 3 PM in the afternoon.  She got up to go to the bathroom however had significant dizziness and weakness.  She subsequently fell down to the ground.  She did not  lose consciousness.  The episode was witnessed by family member who noted patient was somewhat confused.  She was subsequently sent to [facility] for further evaluation.  Creatinine was reportedly 1.8 on arrival, baseline creatinine 0.7.  She also reportedly had a echocardiogram that showed EF 55%, moderate aortic stenosis.  CT of the head was negative.  When she fell, she bruised her right side.  However chest x-ray shows no obvious fractures.  She was later sent to rehab for the next 7 days.  Since she moved back to [place], she has not had any further weakness episode.  I recommend a basic metabolic panel to check her renal function and electrolyte.  Family did mention that immediately prior to the ED visit and the weakness episode, she was given the antibiotic for urinary tract infection.  This could explain why her creatinine was elevated.  I suspect she was dehydrated in the setting of urinary tract infection.  She denies any further episodes of dizziness or weakness episode.  I did recommend a 2-week heart monitor to further assess.  With her moderate aortic stenosis seen on recent echo, I recommended a repeat echocardiogram in 1 year.  We will obtain the full echo report from [facility].    Past Medical History:  Diagnosis Date   Arthritis    right   Atrial fibrillation (HCC)    GERD (gastroesophageal reflux  disease)    hx of   Hepatitis C    Humerus fracture    Hyperlipidemia    Hypertension    Irritable bowel syndrome    Meningioma (HCC)    PONV (postoperative nausea and vomiting)     Past Surgical History:  Procedure Laterality Date   BREAST SURGERY     lumpectomy   CARDIOVASCULAR STRESS TEST  11/14/2006   EF 67%   COLONOSCOPY W/ BIOPSIES AND POLYPECTOMY     ORIF HUMERUS FRACTURE Right 04/18/2014   Procedure: OPEN REDUCTION INTERNAL FIXATION (ORIF) RIGHT PROXIMAL HUMERUS FRACTURE;  Surgeon: Netta Cedars, MD;  Location: [place];  Service: Orthopedics;   Laterality: Right;   ORIF PERIPROSTHETIC FRACTURE Left 04/30/2013   Procedure: OPEN REDUCTION INTERNAL FIXATION (ORIF) PERIPROSTHETIC FRACTURE;  Surgeon: [name], MD;  Location: [place];  Service: Orthopedics;  Laterality: Left;   PAROTID GLAND TUMOR EXCISION     TOTAL HIP ARTHROPLASTY  11/07   left   TOTAL KNEE ARTHROPLASTY     TOTAL KNEE ARTHROPLASTY Left 11/24/2017   Procedure: TOTAL KNEE ARTHROPLASTY;  Surgeon: Gaynelle Arabian, MD;  [facility];  Service: Orthopedics;  Laterality: Left;  56mn   TRANSTHORACIC ECHOCARDIOGRAM  10/25/2009   EF 55-60%    Current Medications: Current Meds  Medication Sig   acetaminophen (TYLENOL) 325 MG tablet Take 2 tablets (650 mg total) by mouth every 6 (six) hours as needed for mild pain (or Fever >/= 101).   atorvastatin (LIPITOR) 10 MG tablet Take 10 mg by mouth at bedtime.   ergocalciferol (VITAMIN D2) 1.25 MG (50000 UT) capsule Take 50,000 Units by mouth every Friday.   gabapentin (NEURONTIN) 100 MG capsule Take 100 mg by mouth daily as needed (pain).   hydrALAZINE (APRESOLINE) 25 MG tablet TAKE 1 TABLET BY MOUTH THREE TIMES DAILY   losartan (COZAAR) 25 MG tablet Take 1 tablet by mouth once daily   Multiple Vitamins-Minerals (PRESERVISION AREDS PO) Take 1 capsule by mouth 2 (two) times daily.   omeprazole (PRILOSEC) 40 MG capsule Take 40 mg by mouth 2 (two) times daily.   Polyethyl Glycol-Propyl Glycol (SYSTANE) 0.4-0.3 % SOLN Place 1 drop into both eyes daily as needed.   PROCTO-MED HC 2.5 % rectal cream Apply 1 application topically 2 (two) times daily.   vitamin B-12 (CYANOCOBALAMIN) 1000 MCG tablet Take 1,000 mcg by mouth every evening.    warfarin (COUMADIN) 6 MG tablet Take 3-6 mg by mouth See admin instructions. Take 6 mg by mouth daily on Monday, Wednesday  Take 3 mg by mouth daily on all other days.   zolpidem (AMBIEN) 5 MG tablet Take 5 mg by mouth at bedtime as needed.   [DISCONTINUED] nitroGLYCERIN (NITROSTAT) 0.4 MG SL tablet  Place 1 tablet (0.4 mg total) under the tongue every 5 (five) minutes as needed for chest pain.     Allergies:   Amlodipine, Cephalexin, Ciprofloxacin, Codeine, Metoclopramide hcl, Nitrofuran derivatives, and Rosuvastatin   Social History   Socioeconomic History   Marital status: Widowed    Spouse name: Not on file   Number of children: 2   Years of education: Not on file   Highest education level: Not on file  Occupational History    Employer: RETIRED  Tobacco Use   Smoking status: Former    Packs/day: 0.30    Years: 15.00    Total pack years: 4.50    Types: Cigarettes    Quit date: 06/07/1980    Years since quitting:  41.1   Smokeless tobacco: Never  Vaping Use   Vaping Use: Never used  Substance and Sexual Activity   Alcohol use: No   Drug use: No   Sexual activity: Never  Other Topics Concern   Not on file  Social History Narrative   Not on file   Social Determinants of Health   Financial Resource Strain: Not on file  Food Insecurity: Not on file  Transportation Needs: Not on file  Physical Activity: Not on file  Stress: Not on file  Social Connections: Not on file     Family History: The patient's family history includes Heart disease in her brother. There is no history of Breast cancer.  ROS:   Please see the history of present illness.     All other systems reviewed and are negative.  EKGs/Labs/Other Studies Reviewed:    The following studies were reviewed today:  Echo 06/17/2020  1. Left ventricular ejection fraction, by estimation, is 60 to 65%. The  left ventricle has normal function. The left ventricle has no regional  wall motion abnormalities. There is mild concentric left ventricular  hypertrophy. Left ventricular diastolic  parameters are consistent with Grade II diastolic dysfunction  (pseudonormalization). Elevated left ventricular end-diastolic pressure.   2. Right ventricular systolic function is normal. The right ventricular  size is  normal. Tricuspid regurgitation signal is inadequate for assessing  PA pressure.   3. Left atrial size was severely dilated.   4. The mitral valve is normal in structure. Trivial mitral valve  regurgitation. No evidence of mitral stenosis.   5. The aortic valve is tricuspid. Aortic valve regurgitation is not  visualized. Mild to moderate aortic valve sclerosis/calcification is  present, without any evidence of aortic stenosis. Aortic valve area, by  VTI measures 1.82 cm. Aortic valve mean  gradient measures 9.0 mmHg. Aortic valve Vmax measures 2.08 m/s.   6. Aortic dilatation noted. There is mild dilatation of the aortic root,  measuring 38 mm.   7. The inferior vena cava is normal in size with greater than 50%  respiratory variability, suggesting right atrial pressure of 3 mmHg.   EKG:  EKG is ordered today.  The ekg ordered today demonstrates normal sinus rhythm, heart rate 52 bpm, poor R wave progression in the anterior leads.  Recent Labs: 07/04/2021: BUN 19; Creatinine, Ser 0.72; Potassium 4.8; Sodium 140  Recent Lipid Panel    Component Value Date/Time   CHOL 178 07/14/2016 0536   TRIG 69 07/14/2016 0536   HDL 58 07/14/2016 0536   CHOLHDL 3.1 07/14/2016 0536   VLDL 14 07/14/2016 0536   LDLCALC 106 (H) 07/14/2016 0536     Risk Assessment/Calculations:           Physical Exam:    VS:  BP (!) 153/80   Pulse (!) 52   Ht '5\' 5"'$  (1.651 m)   Wt 141 lb 12.8 oz (64.3 kg)   SpO2 96%   BMI 23.60 kg/m     Wt Readings from Last 3 Encounters:  07/04/21 141 lb 12.8 oz (64.3 kg)  03/27/21 147 lb (66.7 kg)  10/06/20 145 lb (65.8 kg)     GEN:  Well nourished, well developed in no acute distress HEENT: Normal NECK: No JVD; No carotid bruits LYMPHATICS: No lymphadenopathy CARDIAC: RRR, no murmurs, rubs, gallops RESPIRATORY:  Clear to auscultation without rales, wheezing or rhonchi  ABDOMEN: Soft, non-tender, non-distended MUSCULOSKELETAL:  No edema; No deformity  SKIN:  Warm and dry NEUROLOGIC:  Alert and oriented x 3 PSYCHIATRIC:  Normal affect   ASSESSMENT:    1. Pre-syncope   2. PAF (paroxysmal atrial fibrillation) ([place])   3. Essential hypertension   4. Hyperlipidemia LDL goal <100    PLAN:    In order of problems listed above:  Presyncope: Patient was at the beach in early [place] when she had a near passing out spell.  She was treated at [facility].  Prior to the event, she was treated with antibiotic for urinary tract infection.  Echocardiogram was reportedly normal, I do not have the record.  We will request the hospital record.  Based on her history, I suspect she had a dehydration episode.  I did recommend a heart monitor to rule out significant arrhythmia  PAF: On Coumadin.  Not on AV nodal blocking agent given baseline bradycardia  Hypertension: Blood pressure stable  Hyperlipidemia: On Lipitor           Medication Adjustments/Labs and Tests Ordered: Current medicines are reviewed at length with the patient today.  Concerns regarding medicines are outlined above.  Orders Placed This Encounter  Procedures   Basic metabolic panel   LONG TERM MONITOR (3-14 DAYS)   EKG 12-Lead   No orders of the defined types were placed in this encounter.   Patient Instructions  Medication Instructions:  NO CHANGES  *If you need a refill on your cardiac medications before your next appointment, please call your pharmacy*   Lab Work: BMET today   If you have labs (blood work) drawn today and your tests are completely normal, you will receive your results only by: [place] (if you have MyChart) OR A paper copy in the mail If you have any lab test that is abnormal or we need to change your treatment, we will call you to review the results.   Testing/Procedures: Jerome PA has requested you wear a ZIO patch monitor for 14 days.  This is a single patch monitor. [employer] supplies one  patch monitor per enrollment. Additional stickers are not available. Please do not apply patch if you will be having a Nuclear Stress Test,  Echocardiogram, Cardiac CT, MRI, or Chest Xray during the period you would be wearing the  monitor. The patch cannot be worn during these tests. You cannot remove and re-apply the  ZIO XT patch monitor.  Your ZIO patch monitor will be mailed 3 day USPS to your address on file. It may take 3-5 days  to receive your monitor after you have been enrolled.  Once you have received your monitor, please review the enclosed instructions. Your monitor  has already been registered assigning a specific monitor serial # to you.  Billing and Patient Assistance Program Information  We have supplied [employer] with any of your insurance information on file for billing purposes. [employer] offers a sliding scale Patient Assistance Program for patients that do not have  insurance, or whose insurance does not completely cover the cost of the ZIO monitor.  You must apply for the Patient Assistance Program to qualify for this discounted rate.  To apply, please call [employer] at [phone], select option 4, select option 2, ask to apply for  Patient Assistance Program. Theodore Demark will ask your household income, and how many people  are in your household. They will quote your out-of-pocket cost based on that information.  [employer] will also be able to set up a 46-month interest-free payment plan if needed.  Applying the monitor   Shave hair from upper left chest.  Hold abrader disc by orange tab. Rub abrader in 40 strokes over the upper left chest as  indicated in your monitor instructions.  Clean area with 4 enclosed alcohol pads. Let dry.  Apply patch as indicated in monitor instructions. Patch will be placed under collarbone on left  side of chest with arrow pointing upward.  Rub patch adhesive wings for 2 minutes. Remove white label marked "1". Remove the white  label marked  "2". Rub patch adhesive wings for 2 additional minutes.  While looking in a mirror, press and release button in center of patch. A small green light will  flash 3-4 times. This will be your only indicator that the monitor has been turned on.  Do not shower for the first 24 hours. You may shower after the first 24 hours.  Press the button if you feel a symptom. You will hear a small click. Record Date, Time and  Symptom in the Patient Logbook.  When you are ready to remove the patch, follow instructions on the last 2 pages of Patient  Logbook. Stick patch monitor onto the last page of Patient Logbook.  Place Patient Logbook in the blue and white box. Use locking tab on box and tape box closed  securely. The blue and white box has prepaid postage on it. Please place it in the mailbox as  soon as possible. Your physician should have your test results approximately 7 days after the  monitor has been mailed back to [facility].  Call [place] at [phone] if you have questions regarding  your ZIO XT patch monitor. Call them immediately if you see an orange light blinking on your  monitor.  If your monitor falls off in less than 4 days, contact our Monitor department at [phone].  If your monitor becomes loose or falls off after 4 days call [employer] at [phone] for  suggestions on securing your monitor    Follow-Up: At [facility], you and your health needs are our priority.  As part of our continuing mission to provide you with exceptional heart care, we have created designated Provider Care Teams.  These Care Teams include your primary Cardiologist (physician) and Advanced Practice Providers (APPs -  Physician Assistants and Nurse Practitioners) who all work together to provide you with the care you need, when you need it.  We recommend signing up for the patient portal called "MyChart".  Sign up information is provided on this After Visit Summary.  MyChart  is used to connect with patients for Virtual Visits (Telemedicine).  Patients are able to view lab/test results, encounter notes, upcoming appointments, etc.  Non-urgent messages can be sent to your provider as well.   To learn more about what you can do with MyChart, go to [email or web address].    Your next appointment:   6 week(s)  The format for your next appointment:   In Person  Provider:   Almyra Deforest PA   Signed, Almyra Deforest, [place]  07/06/2021 10:20 PM    [place]

## 2021-07-05 ENCOUNTER — Other Ambulatory Visit: Payer: Self-pay

## 2021-07-05 LAB — BASIC METABOLIC PANEL
BUN/Creatinine Ratio: 26 (ref 12–28)
BUN: 19 mg/dL (ref 8–27)
CO2: 18 mmol/L — ABNORMAL LOW (ref 20–29)
Calcium: 9.6 mg/dL (ref 8.7–10.3)
Chloride: 103 mmol/L (ref 96–106)
Creatinine, Ser: 0.72 mg/dL (ref 0.57–1.00)
Glucose: 79 mg/dL (ref 70–99)
Potassium: 4.8 mmol/L (ref 3.5–5.2)
Sodium: 140 mmol/L (ref 134–144)
eGFR: 80 mL/min/{1.73_m2} (ref >59)

## 2021-07-05 MED ORDER — NITROGLYCERIN 0.4 MG SL SUBL: 0.4000 mg | SUBLINGUAL_TABLET | SUBLINGUAL | 2 refills | Status: DC | PRN

## 2021-07-06 ENCOUNTER — Encounter: Payer: Self-pay | Admitting: Physician Assistant

## 2021-07-07 DIAGNOSIS — R55 Syncope and collapse: Secondary | ICD-10-CM

## 2021-07-17 DIAGNOSIS — K3184 Gastroparesis: Secondary | ICD-10-CM | POA: Diagnosis not present

## 2021-07-17 DIAGNOSIS — Z7901 Long term (current) use of anticoagulants: Secondary | ICD-10-CM | POA: Diagnosis not present

## 2021-07-17 DIAGNOSIS — I480 Paroxysmal atrial fibrillation: Secondary | ICD-10-CM | POA: Diagnosis not present

## 2021-07-17 DIAGNOSIS — K219 Gastro-esophageal reflux disease without esophagitis: Secondary | ICD-10-CM | POA: Diagnosis not present

## 2021-07-27 DIAGNOSIS — R55 Syncope and collapse: Secondary | ICD-10-CM | POA: Diagnosis not present

## 2021-08-14 ENCOUNTER — Ambulatory Visit: Payer: Medicare Other | Admitting: Physician Assistant

## 2021-08-14 NOTE — Progress Notes (Deleted)
Cardiology Office Note:    Date:  08/14/2021   ID:  [name], DOB 09-11-1930, MRN [id number]  PCP:  [name], MD   [place] Providers Cardiologist:  Peter Martinique, MD { Click to update primary MD,subspecialty MD or APP then REFRESH:1}    Referring MD: [name], MD   No chief complaint on file. ***  History of Present Illness:    Gloria [name] is a 88 y.o. female with a hx of HTN, HLD, GERD and PAF.  Patient was admitted in May 2018 with chest pain and shortness of breath.  Serial enzymes was negative.  Echocardiogram showed moderate LVH, normal EF.  Myoview was normal.  She had a lower extremity swelling and rash with amlodipine.  Carotid Doppler in September 2020 showed less than 39% disease bilaterally.  She was tested positive for COVID in May 2022 and presented to the hospital in June 2020 for dizziness.  During the admission, patient was bradycardic, flecainide was held.  Systolic blood pressure was over 200.  Hydrochlorothiazide was discontinued and she was started on hydralazine.  MRI showed no acute abnormality.  A CTA of the head and neck was negative for acute abnormality as well.  Patient was assessed by PT for vestibular evaluation and was treated with Epley maneuver.  She was hydrated.  Echocardiogram obtained in June 2022 showed EF 60 to 65%, grade 2 DD, mild to moderate aortic valve sclerosis without evidence of aortic stenosis, mild dilatation of the aortic root measuring at 38 mm.  She was last seen by Dr. Martinique in March 2023 at which time she was doing well.   Past Medical History:  Diagnosis Date   Arthritis    right   Atrial fibrillation (HCC)    GERD (gastroesophageal reflux disease)    hx of   Hepatitis C    Humerus fracture    Hyperlipidemia    Hypertension    Irritable bowel syndrome    Meningioma (HCC)    PONV (postoperative nausea and vomiting)     Past Surgical History:  Procedure Laterality Date   BREAST  SURGERY     lumpectomy   CARDIOVASCULAR STRESS TEST  11/14/2006   EF 67%   COLONOSCOPY W/ BIOPSIES AND POLYPECTOMY     ORIF HUMERUS FRACTURE Right 04/18/2014   Procedure: OPEN REDUCTION INTERNAL FIXATION (ORIF) RIGHT PROXIMAL HUMERUS FRACTURE;  Surgeon: Netta Cedars, MD;  Location: [place];  Service: Orthopedics;  Laterality: Right;   ORIF PERIPROSTHETIC FRACTURE Left 04/30/2013   Procedure: OPEN REDUCTION INTERNAL FIXATION (ORIF) PERIPROSTHETIC FRACTURE;  Surgeon: [name], MD;  Location: [place];  Service: Orthopedics;  Laterality: Left;   PAROTID GLAND TUMOR EXCISION     TOTAL HIP ARTHROPLASTY  11/07   left   TOTAL KNEE ARTHROPLASTY     TOTAL KNEE ARTHROPLASTY Left 11/24/2017   Procedure: TOTAL KNEE ARTHROPLASTY;  Surgeon: Gaynelle Arabian, MD;  [facility];  Service: Orthopedics;  Laterality: Left;  77mn   TRANSTHORACIC ECHOCARDIOGRAM  10/25/2009   EF 55-60%    Current Medications: No outpatient medications have been marked as taking for the 08/14/21 encounter (Appointment) with MAlmyra Deforest PJenkins     Allergies:   Amlodipine, Cephalexin, Ciprofloxacin, Codeine, Metoclopramide hcl, Nitrofuran derivatives, and Rosuvastatin   Social History   Socioeconomic History   Marital status: Widowed    Spouse name: Not on file   Number of children: 2   Years of education: Not on file  Highest education level: Not on file  Occupational History    Employer: RETIRED  Tobacco Use   Smoking status: Former    Packs/day: 0.30    Years: 15.00    Total pack years: 4.50    Types: Cigarettes    Quit date: 06/07/1980    Years since quitting: 41.2   Smokeless tobacco: Never  Vaping Use   Vaping Use: Never used  Substance and Sexual Activity   Alcohol use: No   Drug use: No   Sexual activity: Never  Other Topics Concern   Not on file  Social History Narrative   Not on file   Social Determinants of Health   Financial Resource Strain: Not on file  Food Insecurity: Not on file   Transportation Needs: Not on file  Physical Activity: Not on file  Stress: Not on file  Social Connections: Not on file     Family History: The patient's ***family history includes Heart disease in her brother. There is no history of Breast cancer.  ROS:   Please see the history of present illness.    *** All other systems reviewed and are negative.  EKGs/Labs/Other Studies Reviewed:    The following studies were reviewed today: ***  EKG:  EKG is *** ordered today.  The ekg ordered today demonstrates ***  Recent Labs: 07/04/2021: BUN 19; Creatinine, Ser 0.72; Potassium 4.8; Sodium 140  Recent Lipid Panel    Component Value Date/Time   CHOL 178 07/14/2016 0536   TRIG 69 07/14/2016 0536   HDL 58 07/14/2016 0536   CHOLHDL 3.1 07/14/2016 0536   VLDL 14 07/14/2016 0536   LDLCALC 106 (H) 07/14/2016 0536     Risk Assessment/Calculations:   {Does this patient have ATRIAL FIBRILLATION?:[phone]}       Physical Exam:    VS:  There were no vitals taken for this visit.    No BP recorded.  {Refresh Note OR Click here to enter BP  :1}***   Wt Readings from Last 3 Encounters:  07/04/21 141 lb 12.8 oz (64.3 kg)  03/27/21 147 lb (66.7 kg)  10/06/20 145 lb (65.8 kg)     GEN: *** Well nourished, well developed in no acute distress HEENT: Normal NECK: No JVD; No carotid bruits LYMPHATICS: No lymphadenopathy CARDIAC: ***RRR, no murmurs, rubs, gallops RESPIRATORY:  Clear to auscultation without rales, wheezing or rhonchi  ABDOMEN: Soft, non-tender, non-distended MUSCULOSKELETAL:  No edema; No deformity  SKIN: Warm and dry NEUROLOGIC:  Alert and oriented x 3 PSYCHIATRIC:  Normal affect   ASSESSMENT:    No diagnosis found. PLAN:    In order of problems listed above:  ***      {Are you ordering a CV Procedure (e.g. stress test, cath, DCCV, TEE, etc)?   Press F2        :[id number]}    Medication Adjustments/Labs and Tests Ordered: Current medicines are reviewed  at length with the patient today.  Concerns regarding medicines are outlined above.  No orders of the defined types were placed in this encounter.  No orders of the defined types were placed in this encounter.   There are no Patient Instructions on file for this visit.   Hilbert Corrigan, [place]  08/14/2021 10:55 AM    [place]

## 2021-08-15 DIAGNOSIS — M25511 Pain in right shoulder: Secondary | ICD-10-CM | POA: Diagnosis not present

## 2021-08-16 DIAGNOSIS — H6123 Impacted cerumen, bilateral: Secondary | ICD-10-CM | POA: Diagnosis not present

## 2021-09-03 ENCOUNTER — Other Ambulatory Visit: Payer: Self-pay | Admitting: Cardiology

## 2021-09-04 DIAGNOSIS — Z7901 Long term (current) use of anticoagulants: Secondary | ICD-10-CM | POA: Diagnosis not present

## 2021-09-04 DIAGNOSIS — I480 Paroxysmal atrial fibrillation: Secondary | ICD-10-CM | POA: Diagnosis not present

## 2021-09-06 NOTE — Progress Notes (Signed)
[facility] Note  09/18/2021    CHIEF COMPLAINT Patient presents for Retina Follow Up   HISTORY OF PRESENT ILLNESS: Gloria [name] is a 88 y.o. female who presents to the clinic today for:   HPI     Retina Follow Up   Patient presents with  Dry AMD.  In both eyes.  Duration of 6 months.  Since onset it is stable.  I, the attending physician,  performed the HPI with the patient and updated documentation appropriately.        Comments   Patient feels that the overall vision has decreased.       Last edited by Bernarda Caffey, MD on 09/18/2021 11:40 PM.     Patient states that she is not seeing as well with the right eye. She feels that the right eyelid droops.    Referring physician: [name], MD [place],  [place] [address]  HISTORICAL INFORMATION:   Selected notes from the MEDICAL RECORD NUMBER Referred by Dr. Kathrin Penner LEE: 06-28-19 Ocular Hx- ARMD OU   CURRENT MEDICATIONS: Current Outpatient Medications (Ophthalmic Drugs)  Medication Sig   Polyethyl Glycol-Propyl Glycol (SYSTANE) 0.4-0.3 % SOLN Place 1 drop into both eyes daily as needed.   No current facility-administered medications for this visit. (Ophthalmic Drugs)   Current Outpatient Medications (Other)  Medication Sig   acetaminophen (TYLENOL) 325 MG tablet Take 2 tablets (650 mg total) by mouth every 6 (six) hours as needed for mild pain (or Fever >/= 101).   albuterol (VENTOLIN HFA) 108 (90 Base) MCG/ACT inhaler Inhale 2 puffs into the lungs every 6 (six) hours as needed for wheezing or shortness of breath. (Patient not taking: Reported on 07/04/2021)   atorvastatin (LIPITOR) 10 MG tablet Take 10 mg by mouth at bedtime.   ergocalciferol (VITAMIN D2) 1.25 MG (50000 UT) capsule Take 50,000 Units by mouth every Friday.   gabapentin (NEURONTIN) 100 MG capsule Take 100 mg by mouth daily as needed (pain).   hydrALAZINE (APRESOLINE) 25 MG tablet TAKE 1  TABLET BY MOUTH THREE TIMES DAILY   losartan (COZAAR) 25 MG tablet Take 1 tablet by mouth once daily   mometasone-formoterol (DULERA) 100-5 MCG/ACT AERO Inhale 2 puffs into the lungs 2 (two) times daily. (Patient not taking: Reported on 07/04/2021)   Multiple Vitamins-Minerals (PRESERVISION AREDS PO) Take 1 capsule by mouth 2 (two) times daily.   nitroGLYCERIN (NITROSTAT) 0.4 MG SL tablet Place 1 tablet (0.4 mg total) under the tongue every 5 (five) minutes as needed for chest pain.   omeprazole (PRILOSEC) 40 MG capsule Take 40 mg by mouth 2 (two) times daily.   PROCTO-MED HC 2.5 % rectal cream Apply 1 application topically 2 (two) times daily.   vitamin B-12 (CYANOCOBALAMIN) 1000 MCG tablet Take 1,000 mcg by mouth every evening.    warfarin (COUMADIN) 6 MG tablet Take 3-6 mg by mouth See admin instructions. Take 6 mg by mouth daily on Monday, Wednesday  Take 3 mg by mouth daily on all other days.   zolpidem (AMBIEN) 5 MG tablet Take 5 mg by mouth at bedtime as needed.   No current facility-administered medications for this visit. (Other)   REVIEW OF SYSTEMS: ROS   Positive for: Neurological, Musculoskeletal, Cardiovascular, Eyes, Respiratory Negative for: Constitutional, Gastrointestinal, Skin, Genitourinary, HENT, Endocrine, Psychiatric, Allergic/Imm, Heme/Lymph Last edited by Annie Paras, COT on 09/18/2021  9:52 AM.     ALLERGIES Allergies  Allergen Reactions  Amlodipine Swelling   Cephalexin Other (See Comments)    unknown   Ciprofloxacin Nausea And Vomiting   Codeine Other (See Comments)    unknown   Metoclopramide Hcl Other (See Comments)    unknown   Nitrofuran Derivatives Other (See Comments)    unknown   Rosuvastatin     Other reaction(s): Myalgias   PAST MEDICAL HISTORY Past Medical History:  Diagnosis Date   Arthritis    right   Atrial fibrillation (HCC)    GERD (gastroesophageal reflux disease)    hx of   Hepatitis C    Humerus fracture     Hyperlipidemia    Hypertension    Irritable bowel syndrome    Meningioma (HCC)    PONV (postoperative nausea and vomiting)    Past Surgical History:  Procedure Laterality Date   BREAST SURGERY     lumpectomy   CARDIOVASCULAR STRESS TEST  11/14/2006   EF 67%   COLONOSCOPY W/ BIOPSIES AND POLYPECTOMY     ORIF HUMERUS FRACTURE Right 04/18/2014   Procedure: OPEN REDUCTION INTERNAL FIXATION (ORIF) RIGHT PROXIMAL HUMERUS FRACTURE;  Surgeon: Netta Cedars, MD;  Location: [facility];  Service: Orthopedics;  Laterality: Right;   ORIF PERIPROSTHETIC FRACTURE Left 04/30/2013   Procedure: OPEN REDUCTION INTERNAL FIXATION (ORIF) PERIPROSTHETIC FRACTURE;  Surgeon: [name], MD;  Location: [facility];  Service: Orthopedics;  Laterality: Left;   PAROTID GLAND TUMOR EXCISION     TOTAL HIP ARTHROPLASTY  11/07   left   TOTAL KNEE ARTHROPLASTY     TOTAL KNEE ARTHROPLASTY Left 11/24/2017   Procedure: TOTAL KNEE ARTHROPLASTY;  Surgeon: Gaynelle Arabian, MD;  Location: [facility];  Service: Orthopedics;  Laterality: Left;  67mn   TRANSTHORACIC ECHOCARDIOGRAM  10/25/2009   EF 55-60%   FAMILY HISTORY Family History  Problem Relation Age of Onset   Heart disease Brother    Breast cancer Neg Hx    SOCIAL HISTORY Social History   Tobacco Use   Smoking status: Former    Packs/day: 0.30    Years: 15.00    Total pack years: 4.50    Types: Cigarettes    Quit date: 06/07/1980    Years since quitting: 41.3   Smokeless tobacco: Never  Vaping Use   Vaping Use: Never used  Substance Use Topics   Alcohol use: No   Drug use: No       OPHTHALMIC EXAM:  Base Eye Exam     Visual Acuity (Snellen - Linear)       Right Left   Dist cc 20/80 -2 20/70 -1   Dist ph cc 20/70 +2 20/60 +1         Tonometry (Tonopen, 10:02 AM)       Right Left   Pressure 13 15         Pupils       Dark Light Shape React APD   Right 4 3 Round Brisk None   Left 4 3 Round Brisk None         Visual Fields       Left  Right    Full Full         Extraocular Movement       Right Left    Full, Ortho Full, Ortho         Neuro/Psych     Oriented x3: Yes   Mood/Affect: Normal         Dilation     Both eyes: 1.0% Mydriacyl, 2.5%  Phenylephrine @ 9:52 AM           Slit Lamp and Fundus Exam     Slit Lamp Exam       Right Left   Lids/Lashes Dermatochalasis - upper lid, Meibomian gland dysfunction, flesh colored cyst temporal LL margin Dermatochalasis - upper lid, Meibomian gland dysfunction   Conjunctiva/Sclera white, quiet white, quiet   Cornea +flecks; mild haze, well healed cataract wounds, 2-3+ fine Punctate epithelial erosions, irregular epi, arcus, mild Descemet's folds +flecks; well healed cataract wounds, 3+ Punctate epithelial erosions, tear film debris, irregular epi, arcus, trace Descemet's folds   Anterior Chamber deep; clear deep; clear   Iris round dilated round dilated   Lens PCIOL; open PC PCIOL; open PC   Anterior Vitreous syneresis syneresis         Fundus Exam       Right Left   Disc mild pallor; sharp rim mild pallor; sharp rim, mild PPA   C/D Ratio 0.2 0.1   Macula flat; drusen; central PEDs; blunted foveal reflex; RPE mottling, clumping and early atrophy, no heme or edema flat; blunted foveal reflex; drusen; RPE mottling and clumping, mild PEDs, no heme or edema   Vessels mild attenuation, mild tortuosity mild tortuosity   Periphery attached; reticular degen; +midzonal drusen, No heme attached; reticular degen; midzonal drusen, No heme           Refraction     Wearing Rx       Sphere Cylinder Axis Add   Right -0.50 +1.25 010 +3.00   Left -0.75 +2.25 006 +3.00    Type: prog         Manifest Refraction       Sphere Cylinder Axis Dist VA   Right -1.25 +1.25 025 20/70+2   Left -1.50 +2.00 005 20/60-2           IMAGING AND PROCEDURES  Imaging and Procedures for 09/18/2021  OCT, Retina - OU - Both Eyes       Right Eye Quality was good.  Central Foveal Thickness: 317. Progression has been stable. Findings include normal foveal contour, no IRF, no SRF, retinal drusen , intraretinal hyper-reflective material, pigment epithelial detachment, outer retinal atrophy (Prominent central PEDs with surrounding SRHM--stable).   Left Eye Quality was good. Central Foveal Thickness: 295. Progression has been stable. Findings include normal foveal contour, no IRF, no SRF, retinal drusen , pigment epithelial detachment.   Notes *Images captured and stored on drive  Diagnosis / Impression:  Non-exu ARMD w/ central PEDs OU (OD>OS) OD: Prominent central PEDs with surrounding SRHM--stable  Clinical management:  See below  Abbreviations: NFP - Normal foveal profile. CME - cystoid macular edema. PED - pigment epithelial detachment. IRF - intraretinal fluid. SRF - subretinal fluid. EZ - ellipsoid zone. ERM - epiretinal membrane. ORA - outer retinal atrophy. ORT - outer retinal tubulation. SRHM - subretinal hyper-reflective material. IRHM - intraretinal hyper-reflective material            ASSESSMENT/PLAN:    ICD-10-CM   1. Intermediate stage nonexudative age-related macular degeneration of both eyes  H35.3132 OCT, Retina - OU - Both Eyes    2. Fleck corneal dystrophy  H18.599     3. Essential hypertension  I10     4. Hypertensive retinopathy of both eyes  H35.033     5. Pseudophakia of both eyes  Z96.1       1. Age related macular degeneration, non-exudative, OU -- stable  - Intermediate stage  with prominent central PEDs OU (OD > OS)  - BCVA 20/70 OD (slightly decreased from 20/50--mostly corneal), 20/60 OS (stable)   - OCT stable OU -- no IRF/SRF or exudative disease  - cont AREDS 2 supplementation and amsler grid monitoring  - f/u 6 months DFE, OCT  2. Fleck corneal dystrophy OU  - now under the expert management of Dr. Delman Cheadle  - stable  - continue to monitor  3,4. Hypertensive retinopathy OU - discussed importance of  tight BP control - continue to monitor  5. Pseudophakia OU  - s/p CE/IOL OU  - IOLs in good position, doing well  - continue to monitor  Ophthalmic Meds Ordered this visit:  No orders of the defined types were placed in this encounter.    Return in about 6 months (around 03/19/2022) for f/u Non Ex. AMD OU, DFE, OCT.  There are no Patient Instructions on file for this visit.   Explained the diagnoses, plan, and follow up with the patient and they expressed understanding.  Patient expressed understanding of the importance of proper follow up care.    This document serves as a record of services personally performed by Gardiner Sleeper, MD, PhD. It was created on their behalf by Renaldo Reel, [place] an ophthalmic technician. The creation of this record is the provider's dictation and/or activities during the visit.    Electronically signed by:  Renaldo Reel, COT  09/18/21 11:41 PM   Gardiner Sleeper, M.D., Ph.D. Diseases & Surgery of the Retina and Vitreous [facility]  I have reviewed the above documentation for accuracy and completeness, and I agree with the above. Gardiner Sleeper, M.D., Ph.D. 09/18/21 11:42 PM  Abbreviations: M myopia (nearsighted); A astigmatism; H hyperopia (farsighted); P presbyopia; Mrx spectacle prescription;  CTL contact lenses; OD right eye; OS left eye; OU both eyes  XT exotropia; ET esotropia; PEK punctate epithelial keratitis; PEE punctate epithelial erosions; DES dry eye syndrome; MGD meibomian gland dysfunction; ATs artificial tears; PFAT's preservative free artificial tears; [place] nuclear sclerotic cataract; PSC posterior subcapsular cataract; ERM epi-retinal membrane; PVD posterior vitreous detachment; RD retinal detachment; DM diabetes mellitus; DR diabetic retinopathy; NPDR non-proliferative diabetic retinopathy; PDR proliferative diabetic retinopathy; CSME clinically significant macular edema; DME diabetic macular edema; dbh  dot blot hemorrhages; CWS cotton wool spot; POAG primary open angle glaucoma; C/D cup-to-disc ratio; HVF humphrey visual field; GVF goldmann visual field; OCT optical coherence tomography; IOP intraocular pressure; BRVO Branch retinal vein occlusion; CRVO central retinal vein occlusion; CRAO central retinal artery occlusion; BRAO branch retinal artery occlusion; RT retinal tear; SB scleral buckle; PPV pars plana vitrectomy; VH Vitreous hemorrhage; PRP panretinal laser photocoagulation; IVK intravitreal kenalog; VMT vitreomacular traction; MH Macular hole;  NVD neovascularization of the disc; NVE neovascularization elsewhere; AREDS age related eye disease study; ARMD age related macular degeneration; POAG primary open angle glaucoma; EBMD epithelial/anterior basement membrane dystrophy; ACIOL anterior chamber intraocular lens; IOL intraocular lens; PCIOL posterior chamber intraocular lens; Phaco/IOL phacoemulsification with intraocular lens placement; [place] photorefractive keratectomy; LASIK laser assisted in situ keratomileusis; HTN hypertension; DM diabetes mellitus; COPD chronic obstructive pulmonary disease

## 2021-09-18 ENCOUNTER — Encounter (INDEPENDENT_AMBULATORY_CARE_PROVIDER_SITE_OTHER): Payer: Self-pay | Admitting: Ophthalmology

## 2021-09-18 ENCOUNTER — Ambulatory Visit (INDEPENDENT_AMBULATORY_CARE_PROVIDER_SITE_OTHER): Payer: Medicare Other | Admitting: Ophthalmology

## 2021-09-18 DIAGNOSIS — H35033 Hypertensive retinopathy, bilateral: Secondary | ICD-10-CM | POA: Diagnosis not present

## 2021-09-18 DIAGNOSIS — I10 Essential (primary) hypertension: Secondary | ICD-10-CM

## 2021-09-18 DIAGNOSIS — H18599 Other hereditary corneal dystrophies, unspecified eye: Secondary | ICD-10-CM | POA: Diagnosis not present

## 2021-09-18 DIAGNOSIS — H353132 Nonexudative age-related macular degeneration, bilateral, intermediate dry stage: Secondary | ICD-10-CM | POA: Diagnosis not present

## 2021-09-18 DIAGNOSIS — Z961 Presence of intraocular lens: Secondary | ICD-10-CM | POA: Diagnosis not present

## 2021-09-24 ENCOUNTER — Other Ambulatory Visit: Payer: Self-pay | Admitting: Internal Medicine

## 2021-09-24 DIAGNOSIS — Z1231 Encounter for screening mammogram for malignant neoplasm of breast: Secondary | ICD-10-CM

## 2021-09-25 ENCOUNTER — Ambulatory Visit: Payer: Medicare Other | Admitting: Cardiology

## 2021-09-26 ENCOUNTER — Ambulatory Visit: Payer: Medicare Other | Attending: Cardiology | Admitting: Nurse Practitioner

## 2021-09-26 ENCOUNTER — Encounter: Payer: Self-pay | Admitting: Physician Assistant

## 2021-09-26 VITALS — BP 135/90 | HR 57 | Ht 65.0 in | Wt 142.2 lb

## 2021-09-26 DIAGNOSIS — I480 Paroxysmal atrial fibrillation: Secondary | ICD-10-CM | POA: Diagnosis not present

## 2021-09-26 DIAGNOSIS — E785 Hyperlipidemia, unspecified: Secondary | ICD-10-CM

## 2021-09-26 DIAGNOSIS — I071 Rheumatic tricuspid insufficiency: Secondary | ICD-10-CM | POA: Diagnosis not present

## 2021-09-26 DIAGNOSIS — I6522 Occlusion and stenosis of left carotid artery: Secondary | ICD-10-CM | POA: Diagnosis not present

## 2021-09-26 DIAGNOSIS — R002 Palpitations: Secondary | ICD-10-CM

## 2021-09-26 DIAGNOSIS — I6529 Occlusion and stenosis of unspecified carotid artery: Secondary | ICD-10-CM | POA: Insufficient documentation

## 2021-09-26 DIAGNOSIS — I10 Essential (primary) hypertension: Secondary | ICD-10-CM

## 2021-09-26 DIAGNOSIS — I350 Nonrheumatic aortic (valve) stenosis: Secondary | ICD-10-CM

## 2021-09-26 DIAGNOSIS — R55 Syncope and collapse: Secondary | ICD-10-CM

## 2021-09-26 DIAGNOSIS — I77819 Aortic ectasia, unspecified site: Secondary | ICD-10-CM | POA: Diagnosis not present

## 2021-09-26 DIAGNOSIS — Z7901 Long term (current) use of anticoagulants: Secondary | ICD-10-CM | POA: Diagnosis not present

## 2021-09-26 MED ORDER — METOPROLOL TARTRATE 25 MG PO TABS: 12.5000 mg | ORAL_TABLET | Freq: Two times a day (BID) | ORAL | 1 refills | Status: DC | PRN

## 2021-09-26 MED ORDER — ATORVASTATIN CALCIUM 20 MG PO TABS: 20.0000 mg | ORAL_TABLET | Freq: Every day | ORAL | 3 refills | Status: AC

## 2021-09-26 NOTE — Patient Instructions (Signed)
Medication Instructions:  START Metoprolol Tartrate (Lopressor) 12.5 mg 2 times a day As Needed for palpitations  INCREASE Atorvastatin (Lipitor) to 20 mg daily   *If you need a refill on your cardiac medications before your next appointment, please call your pharmacy*  Lab Work: Your physician recommends that you return for lab work TODAY:  CBC Magnesium   Your physician recommends that you return for lab work in 2 months-11/26/2021:  Fasting Lipid Panel-DO NOT eat or drink past midnight. Okay to have water Hepatic (Liver) Function Test   If you have labs (blood work) drawn today and your tests are completely normal, you will receive your results only by: MyChart Message (if you have MyChart) OR A paper copy in the mail If you have any lab test that is abnormal or we need to change your treatment, we will call you to review the results.  Testing/Procedures: NONE ordered at this time of appointment    Follow-Up: At [facility], you and your health needs are our priority.  As part of our continuing mission to provide you with exceptional heart care, we have created designated Provider Care Teams.  These Care Teams include your primary Cardiologist (physician) and Advanced Practice Providers (APPs -  Physician Assistants and Nurse Practitioners) who all work together to provide you with the care you need, when you need it.  We recommend signing up for the patient portal called "MyChart".  Sign up information is provided on this After Visit Summary.  MyChart is used to connect with patients for Virtual Visits (Telemedicine).  Patients are able to view lab/test results, encounter notes, upcoming appointments, etc.  Non-urgent messages can be sent to your provider as well.   To learn more about what you can do with MyChart, go to [email or web address].    Your next appointment:   4-5 month(s)  The format for your next appointment:   In Person  Provider:   Peter Martinique, MD      Other Instructions  Important Information About Sugar

## 2021-09-26 NOTE — Progress Notes (Addendum)
Cardiology Office Note:    Date:  09/26/2021   ID:  Gloria [name], DOB 09-20-1930, MRN [id number]  PCP:  [name], MD   [place] Providers Cardiologist:  Peter Martinique, MD     Referring MD: [name], MD   CC: Here for follow-up  History of Present Illness:    Gloria [name] is a 88 y.o. female with a hx of the following:   HTN PAF TIA SB BPPV IBS Carotid artery stenosis Moderate aortic stenosis Moderate TR  Was admitted in 2018 for chest pain and shortness of breath, troponins were negative.  Echocardiogram revealed normal EF with moderate LVH.  Myoview was normal.  Carotid Doppler in 2020 showed less than 39% carotid disease bilaterally.  Was in the hospital in 2022 she tested positive for COVID and presented with dizziness, was found to be bradycardic during the hospitalization therefore flecainide was held.  SBP was > 200.  She was started on hydralazine and hydrochlorothiazide was discontinued.  CTA of head and neck was negative for anything acute and MRI did not show any acute abnormality.  Was evaluated by PT for vestibular evaluation for dizziness and was treated with Epley maneuver.  Echocardiogram in June 2022 showed LVEF 96-22%, grade 2 diastolic dysfunction, mild to moderate aortic valve sclerosis without stenosis, aortic root mildly dilated measuring at 38 mm.  She was last seen in the office on July 04, 2021 and evaluated by [place], PA after having a presyncopal episode while at the beach in early June.  Was having significant dizziness and weakness, got up to go to the bathroom, and fell to the ground, did not experience LOC.  Family member who witnessed this said she appeared somewhat confused.  Was evaluated at [facility].  Creatinine elevated at 1.8 on arrival echocardiogram revealed LVEF 55%, moderate aortic stenosis, moderate TR. CT was negative of the head. X-rays revealed no obvious fractures despite bruising her  right side from fall.  Was sent to rehab for 7 days after discharge.  Family said prior to this episode she was given antibiotic for UTI, at follow-up visit in June she denied any recurrent episodes.  Carotid ultrasound showed moderate left carotid disease, normal right carotid artery. 14 day Zio monitor revealed average heart rate 65, heart rate ranging from 46-197.  PVC burden 1%, longest SVT episode 28 minutes with heart rate 107 bpm.  No evidence of A-fib and no significant pauses or AV block.   Today she presents for follow-up.  Since last office visit, she says she has only had 2 episodes of palpitations, says this was brief in duration, and believes stress contributed to this.  Denies any chest pain, has minimal shortness of breath that is noticed seldomly when going to the mailbox, reports this is not bothersome for her.  Denies any orthopnea, PND, swelling, significant weight changes, dizziness, syncope, or PND.  Denies any falls.  Does not know why her blood pressure is elevated today on arrival.  Does admit to recent stress that she believes is contributing blood pressure to be read as high.  Denies any other questions or concerns today.  Past Medical History:  Diagnosis Date   Arthritis    right   Atrial fibrillation (HCC)    GERD (gastroesophageal reflux disease)    hx of   Hepatitis C    Humerus fracture    Hyperlipidemia    Hypertension    Irritable bowel syndrome  Meningioma (HCC)    PONV (postoperative nausea and vomiting)     Past Surgical History:  Procedure Laterality Date   BREAST SURGERY     lumpectomy   CARDIOVASCULAR STRESS TEST  11/14/2006   EF 67%   COLONOSCOPY W/ BIOPSIES AND POLYPECTOMY     ORIF HUMERUS FRACTURE Right 04/18/2014   Procedure: OPEN REDUCTION INTERNAL FIXATION (ORIF) RIGHT PROXIMAL HUMERUS FRACTURE;  Surgeon: Netta Cedars, MD;  Location: [facility];  Service: Orthopedics;  Laterality: Right;   ORIF PERIPROSTHETIC FRACTURE Left 04/30/2013   Procedure:  OPEN REDUCTION INTERNAL FIXATION (ORIF) PERIPROSTHETIC FRACTURE;  Surgeon: [name], MD;  Location: [facility];  Service: Orthopedics;  Laterality: Left;   PAROTID GLAND TUMOR EXCISION     TOTAL HIP ARTHROPLASTY  11/07   left   TOTAL KNEE ARTHROPLASTY     TOTAL KNEE ARTHROPLASTY Left 11/24/2017   Procedure: TOTAL KNEE ARTHROPLASTY;  Surgeon: Gaynelle Arabian, MD;  Location: [facility];  Service: Orthopedics;  Laterality: Left;  64mn   TRANSTHORACIC ECHOCARDIOGRAM  10/25/2009   EF 55-60%    Current Medications: Current Meds  Medication Sig   acetaminophen (TYLENOL) 325 MG tablet Take 2 tablets (650 mg total) by mouth every 6 (six) hours as needed for mild pain (or Fever >/= 101).   ergocalciferol (VITAMIN D2) 1.25 MG (50000 UT) capsule Take 50,000 Units by mouth every Friday.   gabapentin (NEURONTIN) 100 MG capsule Take 100 mg by mouth daily as needed (pain).   hydrALAZINE (APRESOLINE) 25 MG tablet TAKE 1 TABLET BY MOUTH THREE TIMES DAILY   losartan (COZAAR) 25 MG tablet Take 1 tablet by mouth once daily   Multiple Vitamins-Minerals (PRESERVISION AREDS PO) Take 1 capsule by mouth 2 (two) times daily.   nitroGLYCERIN (NITROSTAT) 0.4 MG SL tablet Place 1 tablet (0.4 mg total) under the tongue every 5 (five) minutes as needed for chest pain.   omeprazole (PRILOSEC) 40 MG capsule Take 40 mg by mouth 2 (two) times daily.   Polyethyl Glycol-Propyl Glycol (SYSTANE) 0.4-0.3 % SOLN Place 1 drop into both eyes daily as needed.   polyethylene glycol (MIRALAX) 17 g packet Take 17 g by mouth daily.   PROCTO-MED HC 2.5 % rectal cream Apply 1 application topically 2 (two) times daily.   vitamin B-12 (CYANOCOBALAMIN) 1000 MCG tablet Take 1,000 mcg by mouth every evening.    warfarin (COUMADIN) 6 MG tablet Take 3-6 mg by mouth See admin instructions. Take 6 mg by mouth daily on Monday, Wednesday  Take 3 mg by mouth daily on all other days.   zolpidem (AMBIEN) 5 MG tablet Take 5 mg by mouth at bedtime as  needed.    atorvastatin (LIPITOR) 10 MG tablet Take 10 mg by mouth at bedtime.     Allergies:   Amlodipine, Cephalexin, Ciprofloxacin, Codeine, Metoclopramide hcl, Nitrofuran derivatives, and Rosuvastatin   Social History   Socioeconomic History   Marital status: Widowed    Spouse name: Not on file   Number of children: 2   Years of education: Not on file   Highest education level: Not on file  Occupational History    Employer: RETIRED  Tobacco Use   Smoking status: Former    Packs/day: 0.30    Years: 15.00    Total pack years: 4.50    Types: Cigarettes    Quit date: 06/07/1980    Years since quitting: 41.3   Smokeless tobacco: Never  Vaping Use   Vaping Use: Never used  Substance and  Sexual Activity   Alcohol use: No   Drug use: No   Sexual activity: Never  Other Topics Concern   Not on file  Social History Narrative   Not on file   Social Determinants of Health   Financial Resource Strain: Not on file  Food Insecurity: Not on file  Transportation Needs: Not on file  Physical Activity: Not on file  Stress: Not on file  Social Connections: Not on file     Family History: The patient's family history includes Heart disease in her brother. There is no history of Breast cancer.  ROS:   Review of Systems  Constitutional: Negative.   HENT: Negative.    Eyes: Negative.   Respiratory:  Positive for shortness of breath. Negative for cough, hemoptysis, sputum production and wheezing.        See HPI.  Cardiovascular:  Positive for palpitations. Negative for chest pain, orthopnea, claudication, leg swelling and PND.       See HPI.  Gastrointestinal: Negative.   Genitourinary: Negative.   Musculoskeletal:  Positive for joint pain. Negative for back pain, falls, myalgias and neck pain.       Previous surgery on right arm.  Skin: Negative.   Neurological: Negative.   Endo/Heme/Allergies: Negative.   Psychiatric/Behavioral:  Negative for depression, hallucinations,  memory loss, substance abuse and suicidal ideas. The patient is nervous/anxious. The patient does not have insomnia.     Please see the history of present illness.    All other systems reviewed and are negative.  EKGs/Labs/Other Studies Reviewed:    The following studies were reviewed today:   EKG:  EKG is not ordered today.   14-day ZIO monitor on July 31, 2021:  Normal sinus rhythm   Occasional PACs.   Rare PVCs   SVT longest episode 28 minutes with rate 107 bpm.   No definite evidence of AFib   No significant pauses or AV block.     Patch Wear Time:  13 days and 16 hours (2023-07-01T18:24:12-398 to 2023-07-15T10:31:29-0400)   Patient had a min HR of 46 bpm, max HR of 197 bpm, and avg HR of 65 bpm. Predominant underlying rhythm was Sinus Rhythm. 643 Supraventricular Tachycardia runs occurred, the run with the fastest interval lasting 4 beats with a max rate of 197 bpm, the  longest lasting 28 mins 1 sec with an avg rate of 107 bpm. True duration of Supraventricular Tachycardia difficult to ascertain due to artifact. Isolated SVEs were occasional (1.0%, 12061), SVE Couplets were rare (<1.0%, 833), and SVE Triplets were rare  (<1.0%, 446). Isolated VEs were rare (<1.0%), VE Couplets were rare (<1.0%), and no VE Triplets were present.   2D echocardiogram on June 17, 2020:  1. Left ventricular ejection fraction, by estimation, is 60 to 65%. The  left ventricle has normal function. The left ventricle has no regional  wall motion abnormalities. There is mild concentric left ventricular  hypertrophy. Left ventricular diastolic  parameters are consistent with Grade II diastolic dysfunction  (pseudonormalization). Elevated left ventricular end-diastolic pressure.   2. Right ventricular systolic function is normal. The right ventricular  size is normal. Tricuspid regurgitation signal is inadequate for assessing  PA pressure.   3. Left atrial size was severely dilated.   4. The mitral  valve is normal in structure. Trivial mitral valve  regurgitation. No evidence of mitral stenosis.   5. The aortic valve is tricuspid. Aortic valve regurgitation is not  visualized. Mild to moderate aortic valve sclerosis/calcification is  present, without any evidence of aortic stenosis. Aortic valve area, by  VTI measures 1.82 cm. Aortic valve mean  gradient measures 9.0 mmHg. Aortic valve Vmax measures 2.08 m/s.   6. Aortic dilatation noted. There is mild dilatation of the aortic root,  measuring 38 mm.   7. The inferior vena cava is normal in size with greater than 50%  respiratory variability, suggesting right atrial pressure of 3 mmHg.    Nuclear Medicine stress test on 06/05/2016: The study is normal. This is a low risk study. The left ventricular ejection fraction is hyperdynamic (>65%). There was no ST segment deviation noted during stress.   Normal resting and stress perfusion. No ischemia or infarction EF 72%   Recent Labs: 07/04/2021: BUN 19; Creatinine, Ser 0.72; Potassium 4.8; Sodium 140  Recent Lipid Panel    Component Value Date/Time   CHOL 178 07/14/2016 0536   TRIG 69 07/14/2016 0536   HDL 58 07/14/2016 0536   CHOLHDL 3.1 07/14/2016 0536   VLDL 14 07/14/2016 0536   LDLCALC 106 (H) 07/14/2016 0536     Risk Assessment/Calculations:    CHA2DS2-VASc Score = 5  This indicates a 7.2% annual risk of stroke. The patient's score is based upon: CHF History: 0 HTN History: 1 Diabetes History: 0 Stroke History: 0 Vascular Disease History: 1 Age Score: 2 Gender Score: 1    HYPERTENSION CONTROL Vitals:   09/26/21 1129 09/26/21 1235  BP: (!) 190/92 (!) 135/90    The patient's blood pressure is elevated above target today.  In order to address the patient's elevated BP: Blood pressure will be monitored at home to determine if medication changes need to be made.; The blood pressure is usually elevated in clinic.  Blood pressures monitored at home have been  optimal.            Physical Exam:    VS:  BP (!) 135/90 (BP Location: Left Arm, Patient Position: Sitting, Cuff Size: Normal)   Pulse (!) 57   Ht '5\' 5"'$  (1.651 m)   Wt 142 lb 3.2 oz (64.5 kg)   SpO2 98%   BMI 23.66 kg/m     Wt Readings from Last 3 Encounters:  09/26/21 142 lb 3.2 oz (64.5 kg)  07/04/21 141 lb 12.8 oz (64.3 kg)  03/27/21 147 lb (66.7 kg)     GEN: Thin, frail 88 y.o. Caucasian female in no acute distress HEENT: Normal NECK: No JVD; No carotid bruits CARDIAC: RRR, no murmurs, rubs, gallops; 2+ peripheral pulses, strong and equal bilaterally RESPIRATORY:  Clear to auscultation without rales, wheezing or rhonchi  MUSCULOSKELETAL:  No edema; No deformity  SKIN: Warm and dry NEUROLOGIC:  Alert and oriented x 3 PSYCHIATRIC:  Normal affect   ASSESSMENT:    1. Palpitations   2. PAF (paroxysmal atrial fibrillation) ([place])   3. Long term (current) use of anticoagulants   4. Pre-syncope   5. Moderate aortic valve stenosis   6. Moderate tricuspid regurgitation   7. Aortic dilatation (HCC)   8. Hypertension, unspecified type   9. Stenosis of left carotid artery   10. Hyperlipidemia, unspecified hyperlipidemia type    PLAN:    In order of problems listed above:  Palpitations, PAF, long term anticoagulation Has had 2 episodes of palpitations since she was last seen.  Only lasted a few minutes in duration.  Will obtain will obtain CBC and magnesium level today.  Went over recent 14-day ZIO monitor results with her.  Overall was mainly in  normal sinus rhythm, it was noted that SVT episode was 28 minutes with heart rate 107 bpm.  No evidence of A-fib, pauses, or AV block.  Will initiate low-dose metoprolol tartrate (12.5 mg) twice daily as needed for palpitations.  Denies any recent episodes or symptoms of A-fib, recent monitor did not reveal A-fib. Continue follow-up with Coumadin clinic for INR monitoring and medication dosage.   Presyncope, Moderate Aortic Valve  stenosis, Moderate TR No more recurrent presyncopal episodes.  She denies any recurrent dizziness or passing out spells.  We will obtain CBC and magnesium level today as mentioned above. Most recent echocardiogram in 2022 on file revealed LVEF 60 to 65%, mild LVH, grade 2 diastolic dysfunction, elevated LVEDP, severely dilated left atrial size, trivial MR, mild to moderate aortic valve sclerosis/calcification without stenosis, aortic dilatation noted, measuring 38 mm.  It was reported that she had an echocardiogram that was recently done that showed EF 55-60%, moderate aortic stenosis, moderate TR. Completely asymptomatic.  Recommend repeating echocardiogram in 1 year to monitor aortic valve stenosis, tricuspid regurgitation, and aortic dilatation.  HTN  BP on arrival 190/92, repeat manual by me was 135/90.  BP overall well managed at home.  Given BP log and discussed to monitor BP at home at least 2 hours after medications and sitting for 5-10 minutes.  Discussed with patient to let me know BP is consistently elevated greater than 401 systolic.  She verbalized understanding.  Carotid artery stenosis, HLD  Carotid ultrasound from [facility] showed moderate left carotid disease, normal right carotid artery.  Previous carotid Doppler on file from 2020 revealed 1 to 39% stenosis bilaterally with normal flow hemodynamics.  Will increase Lipitor from 10 mg to 20 mg daily.  We will recheck FLP and LFT in 2 months per protocol.     5. Disposition: Follow-up in 4 to 5 months with Dr. Martinique or sooner if anything changes.     Medication Adjustments/Labs and Tests Ordered: Current medicines are reviewed at length with the patient today.  Concerns regarding medicines are outlined above.  Orders Placed This Encounter  Procedures   Lipid panel   Hepatic function panel   CBC   Magnesium   Meds ordered this encounter  Medications   atorvastatin (LIPITOR) 20 MG tablet    Sig: Take 1 tablet (20 mg  total) by mouth at bedtime.    Dispense:  90 tablet    Refill:  3    Dose change new Rx   metoprolol tartrate (LOPRESSOR) 25 MG tablet    Sig: Take 0.5 tablets (12.5 mg total) by mouth 2 (two) times daily as needed (palpitations).    Dispense:  30 tablet    Refill:  1    Patient Instructions  Medication Instructions:  START Metoprolol Tartrate (Lopressor) 12.5 mg 2 times a day As Needed for palpitations  INCREASE Atorvastatin (Lipitor) to 20 mg daily   *If you need a refill on your cardiac medications before your next appointment, please call your pharmacy*  Lab Work: Your physician recommends that you return for lab work TODAY:  CBC Magnesium   Your physician recommends that you return for lab work in 2 months-11/26/2021:  Fasting Lipid Panel-DO NOT eat or drink past midnight. Okay to have water Hepatic (Liver) Function Test   If you have labs (blood work) drawn today and your tests are completely normal, you will receive your results only by: MyChart Message (if you have MyChart) OR A paper copy in the  mail If you have any lab test that is abnormal or we need to change your treatment, we will call you to review the results.  Testing/Procedures: NONE ordered at this time of appointment    Follow-Up: At [facility], you and your health needs are our priority.  As part of our continuing mission to provide you with exceptional heart care, we have created designated Provider Care Teams.  These Care Teams include your primary Cardiologist (physician) and Advanced Practice Providers (APPs -  Physician Assistants and Nurse Practitioners) who all work together to provide you with the care you need, when you need it.  We recommend signing up for the patient portal called "MyChart".  Sign up information is provided on this After Visit Summary.  MyChart is used to connect with patients for Virtual Visits (Telemedicine).  Patients are able to view lab/test results, encounter  notes, upcoming appointments, etc.  Non-urgent messages can be sent to your provider as well.   To learn more about what you can do with MyChart, go to [email or web address].    Your next appointment:   4-5 month(s)  The format for your next appointment:   In Person  Provider:   Peter Martinique, MD     Other Instructions  Important Information About Sugar         Signed, Finis Bud, NP  09/26/2021 9:54 PM    [place]

## 2021-09-27 DIAGNOSIS — R002 Palpitations: Secondary | ICD-10-CM | POA: Diagnosis not present

## 2021-09-28 LAB — CBC
Hematocrit: 38.7 % (ref 34.0–46.6)
Hemoglobin: 12.8 g/dL (ref 11.1–15.9)
MCH: 29.1 pg (ref 26.6–33.0)
MCHC: 33.1 g/dL (ref 31.5–35.7)
MCV: 88 fL (ref 79–97)
Platelets: 224 10*3/uL (ref 150–450)
RBC: 4.4 x10E6/uL (ref 3.77–5.28)
RDW: 13.2 % (ref 11.7–15.4)
WBC: 7 10*3/uL (ref 3.4–10.8)

## 2021-09-28 LAB — MAGNESIUM: Magnesium: 2.5 mg/dL — ABNORMAL HIGH (ref 1.6–2.3)

## 2021-10-02 ENCOUNTER — Other Ambulatory Visit: Payer: Self-pay | Admitting: *Deleted

## 2021-10-02 DIAGNOSIS — I480 Paroxysmal atrial fibrillation: Secondary | ICD-10-CM | POA: Diagnosis not present

## 2021-10-02 DIAGNOSIS — Z7901 Long term (current) use of anticoagulants: Secondary | ICD-10-CM | POA: Diagnosis not present

## 2021-10-10 DIAGNOSIS — I480 Paroxysmal atrial fibrillation: Secondary | ICD-10-CM | POA: Diagnosis not present

## 2021-10-10 LAB — MAGNESIUM: Magnesium: 2.1 mg/dL (ref 1.6–2.3)

## 2021-11-07 ENCOUNTER — Ambulatory Visit
Admission: RE | Admit: 2021-11-07 | Discharge: 2021-11-07 | Disposition: A | Discharge status: Home / Self Care | Payer: Medicare Other | Source: Ambulatory Visit | Attending: Internal Medicine | Admitting: Internal Medicine

## 2021-11-07 DIAGNOSIS — Z1231 Encounter for screening mammogram for malignant neoplasm of breast: Secondary | ICD-10-CM

## 2021-11-13 DIAGNOSIS — I480 Paroxysmal atrial fibrillation: Secondary | ICD-10-CM | POA: Diagnosis not present

## 2021-11-13 DIAGNOSIS — Z7901 Long term (current) use of anticoagulants: Secondary | ICD-10-CM | POA: Diagnosis not present

## 2021-11-13 DIAGNOSIS — Z23 Encounter for immunization: Secondary | ICD-10-CM | POA: Diagnosis not present

## 2021-12-16 ENCOUNTER — Other Ambulatory Visit: Payer: Self-pay | Admitting: Cardiology

## 2021-12-18 DIAGNOSIS — Z7901 Long term (current) use of anticoagulants: Secondary | ICD-10-CM | POA: Diagnosis not present

## 2021-12-18 DIAGNOSIS — I480 Paroxysmal atrial fibrillation: Secondary | ICD-10-CM | POA: Diagnosis not present

## 2021-12-18 DIAGNOSIS — K644 Residual hemorrhoidal skin tags: Secondary | ICD-10-CM | POA: Diagnosis not present

## 2022-01-28 ENCOUNTER — Ambulatory Visit: Payer: Medicare Other | Admitting: Cardiology

## 2022-02-15 NOTE — Progress Notes (Deleted)
Cardiology Office Note:    Date:  03/27/2021   ID:  Gloria [name], DOB March 19, 1930, MRN [id number]  PCP:  [name], MD   [facility] Cardiologist:  Dorlisa Savino [place], MD     Referring MD: [name], MD   Chief Complaint  Patient presents with   Follow-up    6 months.    Edema    Ankles.   Shortness of Breath   Atrial Fibrillation     History of Present Illness:    Gloria [name] is a 89 y.o. female with a hx of hypertension, hyperlipidemia, GERD and PAF.  Patient was admitted in May 2018 with chest pain and shortness of breath, serial troponin was negative.  Echocardiogram showed moderate LVH, LVEF with normal systolic function.  Myoview was normal.  She had lower extremity swelling and a rash with amlodipine.  Carotid Doppler in September 2020 showed less than 39% stenosis bilaterally.  More recently, she was tested positive for COVID on 06/01/2020.  Patient presented to the hospital on 06/16/2020 with dizziness.  On admission she was bradycardic, flecainide was held.  Blood pressure was over 200 in the hospital.  HCTZ was discontinued and she was started on hydralazine.  MRI was negative for acute abnormality.  CTA of the head and neck was negative for acute abnormality as well.  Patient was assessed by PT for vestibular evaluation noted to have posterior left head roll and was treated with Epley maneuver.  She was hydrated.  Echocardiogram obtained on 06/17/2020 showed EF 60 to 65%, grade 2 DD, mild to moderate aortic valve sclerosis without evidence of aortic stenosis, mild dilatation of the aortic root measuring at 38 mm.  She was seen in follow up on 06/23/20 by Almyra Deforest PA-C and her Dizziness had improved but she still has occasional dizziness.  Her BP was elevated and hydralazine dose was increased to tid. She had some palpitations but deferred further evaluation at that time.   She was evaluated in June 2023.  Was having significant dizziness and  weakness, got up to go to the bathroom, and fell to the ground, did not experience LOC.  Family member who witnessed this said she appeared somewhat confused.  Was evaluated at [facility].  Creatinine elevated at 1.8 on arrival echocardiogram revealed LVEF 55%, moderate aortic stenosis, moderate TR. CT was negative of the head. X-rays revealed no obvious fractures despite bruising her right side from fall.  Was sent to rehab for 7 days after discharge.  Family said prior to this episode she was given antibiotic for UTI, at follow-up visit in June she denied any recurrent episodes.  Carotid ultrasound showed moderate left carotid disease, normal right carotid artery. 14 day Zio monitor revealed average heart rate 65, heart rate ranging from 46-197.  PVC burden 1%, longest SVT episode 28 minutes with heart rate 107 bpm.  No evidence of A-fib and no significant pauses or AV block.     On follow up today she is doing well. She reports she had a couple of episodes of palpitations in Jan. Noted rapid heart beat and skipping. Both occurred at rest and lasted less than 30 minutes. None since then. Coumadin has been checking out ok.   Past Medical History:  Diagnosis Date   Arthritis    right   Atrial fibrillation (HCC)    GERD (gastroesophageal reflux disease)    hx of   Hepatitis C    Humerus fracture  Hyperlipidemia    Hypertension    Irritable bowel syndrome    Meningioma (HCC)    PONV (postoperative nausea and vomiting)     Past Surgical History:  Procedure Laterality Date   BREAST SURGERY     lumpectomy   CARDIOVASCULAR STRESS TEST  11/14/2006   EF 67%   COLONOSCOPY W/ BIOPSIES AND POLYPECTOMY     ORIF HUMERUS FRACTURE Right 04/18/2014   Procedure: OPEN REDUCTION INTERNAL FIXATION (ORIF) RIGHT PROXIMAL HUMERUS FRACTURE;  Surgeon: Netta Cedars, MD;  Location: [place];  Service: Orthopedics;  Laterality: Right;   ORIF PERIPROSTHETIC FRACTURE Left 04/30/2013   Procedure: OPEN  REDUCTION INTERNAL FIXATION (ORIF) PERIPROSTHETIC FRACTURE;  Surgeon: [name], MD;  Location: [place];  Service: Orthopedics;  Laterality: Left;   PAROTID GLAND TUMOR EXCISION     TOTAL HIP ARTHROPLASTY  11/07   left   TOTAL KNEE ARTHROPLASTY     TOTAL KNEE ARTHROPLASTY Left 11/24/2017   Procedure: TOTAL KNEE ARTHROPLASTY;  Surgeon: Gaynelle Arabian, MD;  Location: [facility];  Service: Orthopedics;  Laterality: Left;  76mn   TRANSTHORACIC ECHOCARDIOGRAM  10/25/2009   EF 55-60%    Current Medications: Current Meds  Medication Sig   acetaminophen (TYLENOL) 325 MG tablet Take 2 tablets (650 mg total) by mouth every 6 (six) hours as needed for mild pain (or Fever >/= 101).   albuterol (VENTOLIN HFA) 108 (90 Base) MCG/ACT inhaler Inhale 2 puffs into the lungs every 6 (six) hours as needed for wheezing or shortness of breath.   atorvastatin (LIPITOR) 10 MG tablet Take 10 mg by mouth at bedtime.   ergocalciferol (VITAMIN D2) 1.25 MG (50000 UT) capsule Take 50,000 Units by mouth every Friday.   gabapentin (NEURONTIN) 100 MG capsule Take 100 mg by mouth daily as needed (pain).   hydrALAZINE (APRESOLINE) 25 MG tablet TAKE 1 TABLET BY MOUTH THREE TIMES DAILY   losartan (COZAAR) 25 MG tablet Take 1 tablet by mouth once daily   mometasone-formoterol (DULERA) 100-5 MCG/ACT AERO Inhale 2 puffs into the lungs 2 (two) times daily.   Multiple Vitamins-Minerals (PRESERVISION AREDS PO) Take 1 capsule by mouth 2 (two) times daily.   nitroGLYCERIN (NITROSTAT) 0.4 MG SL tablet Place 1 tablet (0.4 mg total) under the tongue every 5 (five) minutes as needed for chest pain.   omeprazole (PRILOSEC) 40 MG capsule Take 40 mg by mouth 2 (two) times daily.   Polyethyl Glycol-Propyl Glycol (SYSTANE) 0.4-0.3 % SOLN Place 1 drop into both eyes daily as needed.   PROCTO-MED HC 2.5 % rectal cream Apply 1 application topically 2 (two) times daily.   vitamin B-12 (CYANOCOBALAMIN) 1000 MCG tablet Take 1,000 mcg by mouth every  evening.    warfarin (COUMADIN) 6 MG tablet Take 3-6 mg by mouth See admin instructions. Take 6 mg by mouth daily on Monday, Wednesday  Take 3 mg by mouth daily on all other days.   zolpidem (AMBIEN) 5 MG tablet Take 5 mg by mouth at bedtime as needed.   [DISCONTINUED] hydrocortisone cream 0.5 % Apply 1 application. topically 2 (two) times daily.     Allergies:   Amlodipine, Cephalexin, Codeine, Metoclopramide hcl, and Nitrofuran derivatives   Social History   Socioeconomic History   Marital status: Widowed    Spouse name: Not on file   Number of children: 2   Years of education: Not on file   Highest education level: Not on file  Occupational History    Employer: RETIRED  Tobacco Use  Smoking status: Former    Packs/day: 0.30    Years: 15.00    Pack years: 4.50    Types: Cigarettes    Quit date: 06/07/1980    Years since quitting: 40.8   Smokeless tobacco: Never  Vaping Use   Vaping Use: Never used  Substance and Sexual Activity   Alcohol use: No   Drug use: No   Sexual activity: Never  Other Topics Concern   Not on file  Social History Narrative   Not on file   Social Determinants of Health   Financial Resource Strain: Not on file  Food Insecurity: Not on file  Transportation Needs: Not on file  Physical Activity: Not on file  Stress: Not on file  Social Connections: Not on file     Family History: The patient's family history includes Heart disease in her brother. There is no history of Breast cancer.  ROS:   Please see the history of present illness.     All other systems reviewed and are negative.  EKGs/Labs/Other Studies Reviewed:    The following studies were reviewed today:  Echo 06/17/2020  1. Left ventricular ejection fraction, by estimation, is 60 to 65%. The  left ventricle has normal function. The left ventricle has no regional  wall motion abnormalities. There is mild concentric left ventricular  hypertrophy. Left ventricular diastolic   parameters are consistent with Grade II diastolic dysfunction  (pseudonormalization). Elevated left ventricular end-diastolic pressure.   2. Right ventricular systolic function is normal. The right ventricular  size is normal. Tricuspid regurgitation signal is inadequate for assessing  PA pressure.   3. Left atrial size was severely dilated.   4. The mitral valve is normal in structure. Trivial mitral valve  regurgitation. No evidence of mitral stenosis.   5. The aortic valve is tricuspid. Aortic valve regurgitation is not  visualized. Mild to moderate aortic valve sclerosis/calcification is  present, without any evidence of aortic stenosis. Aortic valve area, by  VTI measures 1.82 cm. Aortic valve mean  gradient measures 9.0 mmHg. Aortic valve Vmax measures 2.08 m/s.   6. Aortic dilatation noted. There is mild dilatation of the aortic root,  measuring 38 mm.   7. The inferior vena cava is normal in size with greater than 50%  respiratory variability, suggesting right atrial pressure of 3 mmHg.   Event monitor 07/31/21: Study Highlights      Normal sinus rhythm   Occasional PACs.   Rare PVCs   SVT longest episode 28 minutes with rate 107 bpm.   No definite evidence of AFib   No significant pauses or AV block.     Patch Wear Time:  13 days and 16 hours (2023-07-01T18:24:12-398 to 2023-07-15T10:31:29-0400)   Patient had a min HR of 46 bpm, max HR of 197 bpm, and avg HR of 65 bpm. Predominant underlying rhythm was Sinus Rhythm. 643 Supraventricular Tachycardia runs occurred, the run with the fastest interval lasting 4 beats with a max rate of 197 bpm, the  longest lasting 28 mins 1 sec with an avg rate of 107 bpm. True duration of Supraventricular Tachycardia difficult to ascertain due to artifact. Isolated SVEs were occasional (1.0%, 12061), SVE Couplets were rare (<1.0%, 833), and SVE Triplets were rare  (<1.0%, 446). Isolated VEs were rare (<1.0%), VE Couplets were rare (<1.0%),  and no VE Triplets were present.      EKG:  EKG is not ordered today.    Recent Labs: 06/16/2020: ALT 14; B Natriuretic Peptide  192.4 06/18/2020: BUN 13; Creatinine, Ser 0.76; Hemoglobin 12.7; Magnesium 2.1; Platelets 200; Potassium 3.6; Sodium 137  Recent Lipid Panel    Component Value Date/Time   CHOL 178 07/14/2016 0536   TRIG 69 07/14/2016 0536   HDL 58 07/14/2016 0536   CHOLHDL 3.1 07/14/2016 0536   VLDL 14 07/14/2016 0536   LDLCALC 106 (H) 07/14/2016 0536   Dated 04/18/20: Apo B 73. Cholesterol 157. LDL 77. Triglycerides 78.  Risk Assessment/Calculations:    CHA2DS2-VASc Score = 4  This indicates a 4.8% annual risk of stroke. The patient's score is based upon: CHF History: 0 HTN History: 1 Diabetes History: 0 Stroke History: 0 Vascular Disease History: 0 Age Score: 2 Gender Score: 1          Physical Exam:    VS:  BP 130/84 (BP Location: Left Arm, Patient Position: Sitting, Cuff Size: Normal)   Pulse 62   Ht 5' 5"$  (1.651 m)   Wt 147 lb (66.7 kg)   BMI 24.46 kg/m     Wt Readings from Last 3 Encounters:  03/27/21 147 lb (66.7 kg)  10/06/20 145 lb (65.8 kg)  06/23/20 139 lb (63 kg)     GEN:  Well nourished, well developed in no acute distress HEENT: Normal NECK: No JVD; No carotid bruits LYMPHATICS: No lymphadenopathy CARDIAC: RRR, no murmurs, rubs, gallops RESPIRATORY:  Clear to auscultation without rales, wheezing or rhonchi  ABDOMEN: Soft, non-tender, non-distended MUSCULOSKELETAL:  No edema; No deformity  SKIN: Warm and dry NEUROLOGIC:  Alert and oriented x 3 PSYCHIATRIC:  Normal affect   ASSESSMENT:    1. PAF (paroxysmal atrial fibrillation) ([place])   2. Essential hypertension   3. Hyperlipidemia LDL goal <100      PLAN:    In order of problems listed above:  PAF: Continue Coumadin therapy for anticoagulation. Off Flecainide due to significant bradycardia in hospital. On no rate slowing medication now. Fortunately episodes of Afib have  been infrequent and self terminating. Follow up in 6 months.  Hypertension: Blood pressure is well controlled today  Hyperlipidemia: Continue Lipitor- well controlled. Plan labs with PCP next month        Medication Adjustments/Labs and Tests Ordered: Current medicines are reviewed at length with the patient today.  Concerns regarding medicines are outlined above.  No orders of the defined types were placed in this encounter.   No orders of the defined types were placed in this encounter.    There are no Patient Instructions on file for this visit.   Signed, Anagabriela Jokerst [place], MD  03/27/2021 11:13 AM    [facility]

## 2022-02-21 ENCOUNTER — Encounter (HOSPITAL_COMMUNITY): Payer: Self-pay

## 2022-02-21 ENCOUNTER — Emergency Department (HOSPITAL_COMMUNITY)
Admission: EM | Admit: 2022-02-21 | Discharge: 2022-02-21 | Disposition: A | Discharge status: Home / Self Care | Payer: Medicare Other | Attending: Emergency Medicine | Admitting: Emergency Medicine

## 2022-02-21 ENCOUNTER — Other Ambulatory Visit: Payer: Self-pay

## 2022-02-21 ENCOUNTER — Emergency Department (HOSPITAL_COMMUNITY): Payer: Medicare Other

## 2022-02-21 ENCOUNTER — Ambulatory Visit: Payer: Medicare Other | Admitting: Cardiology

## 2022-02-21 DIAGNOSIS — Z1152 Encounter for screening for COVID-19: Secondary | ICD-10-CM | POA: Diagnosis not present

## 2022-02-21 DIAGNOSIS — R600 Localized edema: Secondary | ICD-10-CM | POA: Diagnosis not present

## 2022-02-21 DIAGNOSIS — I10 Essential (primary) hypertension: Secondary | ICD-10-CM | POA: Insufficient documentation

## 2022-02-21 DIAGNOSIS — J101 Influenza due to other identified influenza virus with other respiratory manifestations: Secondary | ICD-10-CM

## 2022-02-21 DIAGNOSIS — Z79899 Other long term (current) drug therapy: Secondary | ICD-10-CM | POA: Insufficient documentation

## 2022-02-21 DIAGNOSIS — R0602 Shortness of breath: Secondary | ICD-10-CM | POA: Diagnosis present

## 2022-02-21 DIAGNOSIS — Z7901 Long term (current) use of anticoagulants: Secondary | ICD-10-CM | POA: Insufficient documentation

## 2022-02-21 LAB — CBC
HCT: 37.3 % (ref 36.0–46.0)
Hemoglobin: 12.2 g/dL (ref 12.0–15.0)
MCH: 27.7 pg (ref 26.0–34.0)
MCHC: 32.7 g/dL (ref 30.0–36.0)
MCV: 84.8 fL (ref 80.0–100.0)
Platelets: 232 10*3/uL (ref 150–400)
RBC: 4.4 MIL/uL (ref 3.87–5.11)
RDW: 15.1 % (ref 11.5–15.5)
WBC: 5.2 10*3/uL (ref 4.0–10.5)
nRBC: 0 % (ref 0.0–0.2)

## 2022-02-21 LAB — RESP PANEL BY RT-PCR (RSV, FLU A&B, COVID)  RVPGX2
Influenza A by PCR: POSITIVE — AB
Influenza B by PCR: NEGATIVE
Resp Syncytial Virus by PCR: NEGATIVE
SARS Coronavirus 2 by RT PCR: NEGATIVE

## 2022-02-21 LAB — HEPATIC FUNCTION PANEL
ALT: 18 U/L (ref 0–44)
AST: 23 U/L (ref 15–41)
Albumin: 3.2 g/dL — ABNORMAL LOW (ref 3.5–5.0)
Alkaline Phosphatase: 94 U/L (ref 38–126)
Bilirubin, Direct: 0.2 mg/dL (ref 0.0–0.2)
Indirect Bilirubin: 0.3 mg/dL (ref 0.3–0.9)
Total Bilirubin: 0.5 mg/dL (ref 0.3–1.2)
Total Protein: 5.7 g/dL — ABNORMAL LOW (ref 6.5–8.1)

## 2022-02-21 LAB — BASIC METABOLIC PANEL
Anion gap: 10 (ref 5–15)
BUN: 8 mg/dL (ref 8–23)
CO2: 23 mmol/L (ref 22–32)
Calcium: 9.2 mg/dL (ref 8.9–10.3)
Chloride: 106 mmol/L (ref 98–111)
Creatinine, Ser: 0.79 mg/dL (ref 0.44–1.00)
GFR, Estimated: >60 mL/min (ref >60)
Glucose, Bld: 138 mg/dL — ABNORMAL HIGH (ref 70–99)
Potassium: 3.4 mmol/L — ABNORMAL LOW (ref 3.5–5.1)
Sodium: 139 mmol/L (ref 135–145)

## 2022-02-21 LAB — BRAIN NATRIURETIC PEPTIDE: B Natriuretic Peptide: 759.1 pg/mL — ABNORMAL HIGH (ref 0.0–100.0)

## 2022-02-21 LAB — TROPONIN I (HIGH SENSITIVITY)
Troponin I (High Sensitivity): 9 ng/L (ref <18)
Troponin I (High Sensitivity): 10 ng/L (ref <18)

## 2022-02-21 NOTE — ED Provider Notes (Signed)
[facility] Provider Note   CSN: [id number] Arrival date & time: 02/21/22  1824     History Afib, HTN, Meningioma  Chief Complaint  Patient presents with   Shortness of Breath    Gloria [name] is a 89 y.o. female.  89 year old female with a past medical history of A-fib anticoagulated on warfarin resents to the ED with a chief complaint of shortness of breath.  Patient reports Gloria [name] saw her PCP today, who obtained a chest x-ray noted to have a small pleural effusion.  Gloria [name] was prescribed antibiotics, steroids which Gloria [name] was about to go pick up at the pharmacy but then was told to be seen in the emergency department.  Gloria [name] continues to endorse a dry cough, myalgias, runny nose along with some chills.  Gloria [name] reports no chest pain at this time.  Feels that the shortness of breath is kind of ongoing however not exacerbated with any exertion.  Gloria [name] endorsing some mild leg swelling.  Gloria [name] is without any chest pain or headache.   The history is provided by the patient and medical records.  Shortness of Breath Severity:  Mild Onset quality:  Gradual Associated symptoms: no abdominal pain, no chest pain, no fever and no vomiting        Home Medications Prior to Admission medications   Medication Sig Start Date End Date Taking? Authorizing Provider  acetaminophen (TYLENOL) 325 MG tablet Take 2 tablets (650 mg total) by mouth every 6 (six) hours as needed for mild pain (or Fever >/= 101). 06/18/20   Eugenie Filler, MD  atorvastatin (LIPITOR) 20 MG tablet Take 1 tablet (20 mg total) by mouth at bedtime. 09/26/21   Finis Bud, NP  ergocalciferol (VITAMIN D2) 1.25 MG (50000 UT) capsule Take 50,000 Units by mouth every Friday.    [provider]  gabapentin (NEURONTIN) 100 MG capsule Take 100 mg by mouth daily as needed (pain). 02/06/20   [provider]  hydrALAZINE (APRESOLINE) 25 MG tablet TAKE 1 TABLET BY MOUTH THREE TIMES DAILY  12/19/21   Martinique, Peter M, MD  losartan (COZAAR) 25 MG tablet Take 1 tablet by mouth once daily 09/03/21   [place], Peter M, MD  metoprolol tartrate (LOPRESSOR) 25 MG tablet Take 0.5 tablets (12.5 mg total) by mouth 2 (two) times daily as needed (palpitations). 09/26/21   Finis Bud, NP  Multiple Vitamins-Minerals (PRESERVISION AREDS PO) Take 1 capsule by mouth 2 (two) times daily.    [provider]  nitroGLYCERIN (NITROSTAT) 0.4 MG SL tablet Place 1 tablet (0.4 mg total) under the tongue every 5 (five) minutes as needed for chest pain. 07/05/21   [place], Peter M, MD  omeprazole (PRILOSEC) 40 MG capsule Take 40 mg by mouth 2 (two) times daily.    [provider]  Polyethyl Glycol-Propyl Glycol (SYSTANE) 0.4-0.3 % SOLN Place 1 drop into both eyes daily as needed.    [provider]  polyethylene glycol (MIRALAX) 17 g packet Take 17 g by mouth daily.    [provider]  PROCTO-MED HC 2.5 % rectal cream Apply 1 application topically 2 (two) times daily. 03/13/20   [provider]  vitamin B-12 (CYANOCOBALAMIN) 1000 MCG tablet Take 1,000 mcg by mouth every evening.     [provider]  warfarin (COUMADIN) 6 MG tablet Take 3-6 mg by mouth See admin instructions. Take 6 mg by mouth daily on Monday, Wednesday  Take 3 mg by mouth daily on  all other days. 08/16/14   [provider]  zolpidem (AMBIEN) 5 MG tablet Take 5 mg by mouth at bedtime as needed. 12/08/19   [provider]      Allergies    Amlodipine, Cephalexin, Ciprofloxacin, Codeine, Metoclopramide hcl, Nitrofuran derivatives, and Rosuvastatin    Review of Systems   Review of Systems  Constitutional:  Negative for fever.  Respiratory:  Positive for shortness of breath.   Cardiovascular:  Negative for chest pain.  Gastrointestinal:  Negative for abdominal pain, diarrhea, nausea and vomiting.  Genitourinary:  Negative for flank pain.  Musculoskeletal:  Negative for back  pain.  All other systems reviewed and are negative.   Physical Exam Updated Vital Signs BP (!) 137/95   Pulse 98   Temp 98.2 F (36.8 C)   Resp 19   Ht 5' 5"$  (1.651 m)   Wt 64.5 kg   SpO2 94%   BMI 23.66 kg/m  Physical Exam Vitals and nursing note reviewed.  Constitutional:      Appearance: Gloria [name] is well-developed.  HENT:     Head: Normocephalic and atraumatic.  Cardiovascular:     Rate and Rhythm: Normal rate.  Pulmonary:     Effort: Pulmonary effort is normal.     Breath sounds: Examination of the left-lower field reveals decreased breath sounds. Decreased breath sounds present.  Chest:     Chest wall: No tenderness.  Musculoskeletal:     Cervical back: Normal range of motion and neck supple.     Right lower leg: Edema present.     Left lower leg: Edema present.     Comments: Minimal edema bilaterally.  Skin:    General: Skin is warm and dry.  Neurological:     Mental Status: Gloria [name] is alert and oriented to person, place, and time.     ED Results / Procedures / Treatments   Labs (all labs ordered are listed, but only abnormal results are displayed) Labs Reviewed  RESP PANEL BY RT-PCR (RSV, FLU A&B, COVID)  RVPGX2 - Abnormal; Notable for the following components:      Result Value   Influenza A by PCR POSITIVE (*)    All other components within normal limits  BASIC METABOLIC PANEL - Abnormal; Notable for the following components:   Potassium 3.4 (*)    Glucose, Bld 138 (*)    All other components within normal limits  BRAIN NATRIURETIC PEPTIDE - Abnormal; Notable for the following components:   B Natriuretic Peptide 759.1 (*)    All other components within normal limits  HEPATIC FUNCTION PANEL - Abnormal; Notable for the following components:   Total Protein 5.7 (*)    Albumin 3.2 (*)    All other components within normal limits  CBC  PROTIME-INR  TROPONIN I (HIGH SENSITIVITY)  TROPONIN I (HIGH SENSITIVITY)    EKG EKG  Interpretation  Date/Time:  Thursday February 21 2022 18:36:21 EST Ventricular Rate:  86 PR Interval:    QRS Duration: 96 QT Interval:  414 QTC Calculation: 495 R Axis:   -49 Text Interpretation: Atrial fibrillation Left axis deviation Minimal voltage criteria for LVH, may be normal variant ( Cornell product ) Septal infarct , age undetermined T wave abnormality, consider lateral ischemia Abnormal ECG When compared with ECG of 16-Jun-2020 09:51, PREVIOUS ECG IS PRESENT Confirmed by Lennice Sites [phone]) on 02/21/2022 7:20:01 PM  Radiology DG Chest 2 View  Result Date: 02/21/2022 CLINICAL DATA:  fluid on lungs EXAM: CHEST - 2 VIEW COMPARISON:  Chest x-ray June 16, 2020. FINDINGS: Chronic mild prominence of lung markings. No consolidation. No visible pleural effusions or pneumothorax. Mild enlargement the cardiac silhouette, similar polyarticular degenerative change of partially imaged hardware in the right humerus. Osteopenia. Remote right rib fractures. IMPRESSION: No evidence of acute cardiopulmonary disease. Electronically Signed   By: Margaretha Sheffield M.D.   On: 02/21/2022 19:40    Procedures Procedures    Medications Ordered in ED Medications - No data to display  ED Course/ Medical Decision Making/ A&P Clinical Course as of 02/21/22 2302  Thu Feb 21, 2022  2106 Influenza A By PCR(!): POSITIVE [JS]    Clinical Course User Index [JS] Janeece Fitting, PA-C                             Medical Decision Making Amount and/or Complexity of Data Reviewed Labs: ordered. Decision-making details documented in ED Course. Radiology: ordered.    This patient presents to the ED for concern of shortness of breath, this involves a number of treatment options, and is a complaint that carries with it a high risk of complications and morbidity.  The differential diagnosis includes pneumonia, new heart failure, versus post viral illness.    Co morbidities: Discussed in HPI   Brief  History:  Patient with underlying history of A-fib anticoagulated on Eliquis, hypertension presents to the ED with a chief complaint of sent in by primary care physician.  Gloria [name] is currently post COVID day 6, finished Paxlovid reports no improvement in her symptoms.  Gloria [name] was sent a steroid along with antibiotics for home however her PCP called her and advised her to be seen in the emergency department.  Gloria [name] did have a heart rate in the 1 teens at the PCPs office, Gloria [name] was told to take metoprolol 12.5 mg.  Gloria [name] does not have any chest pain at this time, does endorse some swelling to bilateral lower extremities.  EMR reviewed including pt PMHx, past surgical history and past visits to ER.   See HPI for more details   Lab Tests:  I ordered and independently interpreted labs.  The pertinent results include:    I personally reviewed all laboratory work and imaging. Metabolic panel without any acute abnormality specifically kidney function within normal limits and no significant electrolyte abnormalities. CBC without leukocytosis or significant anemia.   Imaging Studies:  NAD. I personally reviewed all imaging studies and no acute abnormality found. I agree with radiology interpretation.  Cardiac Monitoring:  The patient was maintained on a cardiac monitor.  I personally viewed and interpreted the cardiac monitored which showed an underlying rhythm of: Irregularly irregular EKG non-ischemic  Medicines ordered:  N.A  Reevaluation:  After the interventions noted above I re-evaluated patient and found that they have :stayed the same   Social Determinants of Health:  The patient's social determinants of health were a factor in the care of this patient  Problem List / ED Course:  Patient presents to the ED with a chief complaint of fluid in her lungs.  Gloria [name] was evaluated by PCP today, Gloria [name] reports he took an x-ray and visualized some fluid.  Patient tested positive for COVID-19 last week, has  had an entire dose of Paxil bid which Gloria [name] finished yesterday, Gloria [name] reports feeling even worse this morning, reports Gloria [name] was having a hard time with her cough, felt like it was really hard for her to catch her breath with any type of  activity.  Gloria [name] did not have any chest pain during this encounter.  Gloria [name] does report running fevers however these have somewhat subsided.  On exam Gloria [name] is overall hemodynamically stable, no hypoxia satting at 96% on room air without any signs of tachypnea.  EKG was obtained with irregular irregular rhythm, Gloria [name] does have a history of A-fib and is currently anticoagulated on Warfarin does not report any missing doses.  Troponin x 2 have remained flat.  I do not suspect ACS as patient does not have any chest pain at this time.  Low suspicion for pulmonary embolism with patient being anticoagulated and compliance with medication also here without any chest pain.  Prior history of CHF, had BNP level drawn which was elevated at 700 however no signs of volume overload on exam.  Gloria [name] does report Gloria [name] feels her legs are somewhat swollen.  Gloria [name] is rate controlled on metoprolol and Gloria [name] did take 12.5 today when Gloria [name] arrived at home.  Oratory panel test was obtained which did test positive for influenza A, Gloria [name] does report vaccination this season.  I discussed this case with her daughter Freda Munro at the bedside who is aware patient would likely not benefit from starting antibiotics that Gloria [name] is currently suffering from a viral infection.  Gloria [name] does report her PCP placed her on a short course of antibiotics along with steroids. No concern for pneumonia with clear chest xray and no rales on exam. Gloria [name] is stable for discharge.   Dispostion:  After consideration of the diagnostic results and the patients response to treatment, I feel that the patent would benefit from symptomatic treatment, strict return precautions.    Portions of this note were generated with [employer]. Dictation errors may  occur despite best attempts at proofreading.   Final Clinical Impression(s) / ED Diagnoses Final diagnoses:  SOB (shortness of breath)  Influenza A    Rx / DC Orders ED Discharge Orders     None         Janeece Fitting, PA-C 02/21/22 [place], [place], DO 02/21/22 2339

## 2022-02-21 NOTE — Discharge Instructions (Addendum)
Your laboratories also within normal limits today.  Chest x-ray did not show any signs of acute changes on today's visit.  Your respiratory panel was positive for influenza.  Please continue to treat your symptoms at home.

## 2022-02-21 NOTE — ED Triage Notes (Signed)
Pt arrived via [facility] from home. Pt's PCP sent pt, because she had an x-ray and it showed she has fluid on her lungs. Pt c/o SOB. Pt denies chest pain. Pt states still has N/V, diarrhea from COVID. She tested pos 6 days ago for COVID. Pt does not appear to be in resp distress at this time

## 2022-03-05 NOTE — Progress Notes (Signed)
[facility] Note  03/19/2022    CHIEF COMPLAINT Patient presents for Retina Follow Up   HISTORY OF PRESENT ILLNESS: Gloria [name] is a 89 y.o. female who presents to the clinic today for:   HPI     Retina Follow Up   Patient presents with  Wet AMD.  In both eyes.  This started 6 months ago.  Duration of 6 months.  Since onset it is stable.  I, the attending physician,  performed the HPI with the patient and updated documentation appropriately.        Comments   6 month retina follow up AMD OU pt is reporting stable vision since her last appointment she has noticed some floaters but denies any flashes of light       Last edited by Bernarda Caffey, MD on 03/19/2022  5:18 PM.    Pt has a bump on her RLL that she says is interfering with her vision, pt has not been following with [facility] since she started coming here, she is using Refresh drops during the day and Systane at night   Referring physician: Shon Hough, MD [place],  [place] [id number]  HISTORICAL INFORMATION:   Selected notes from the [place] Referred by Dr. Kathrin Penner LEE: 06-28-19 Ocular Hx- ARMD OU   CURRENT MEDICATIONS: Current Outpatient Medications (Ophthalmic Drugs)  Medication Sig   Polyethyl Glycol-Propyl Glycol (SYSTANE) 0.4-0.3 % SOLN Place 1 drop into both eyes daily as needed.   No current facility-administered medications for this visit. (Ophthalmic Drugs)   Current Outpatient Medications (Other)  Medication Sig   acetaminophen (TYLENOL) 325 MG tablet Take 2 tablets (650 mg total) by mouth every 6 (six) hours as needed for mild pain (or Fever >/= 101).   atorvastatin (LIPITOR) 20 MG tablet Take 1 tablet (20 mg total) by mouth at bedtime.   ergocalciferol (VITAMIN D2) 1.25 MG (50000 UT) capsule Take 50,000 Units by mouth every Friday.   gabapentin (NEURONTIN) 100 MG capsule Take 100 mg by mouth daily as needed  (pain).   hydrALAZINE (APRESOLINE) 25 MG tablet TAKE 1 TABLET BY MOUTH THREE TIMES DAILY   losartan (COZAAR) 25 MG tablet Take 1 tablet by mouth once daily   metoprolol tartrate (LOPRESSOR) 25 MG tablet Take 0.5 tablets (12.5 mg total) by mouth 2 (two) times daily as needed (palpitations).   Multiple Vitamins-Minerals (PRESERVISION AREDS PO) Take 1 capsule by mouth 2 (two) times daily.   nitroGLYCERIN (NITROSTAT) 0.4 MG SL tablet Place 1 tablet (0.4 mg total) under the tongue every 5 (five) minutes as needed for chest pain.   omeprazole (PRILOSEC) 40 MG capsule Take 40 mg by mouth 2 (two) times daily.   polyethylene glycol (MIRALAX) 17 g packet Take 17 g by mouth daily.   PROCTO-MED HC 2.5 % rectal cream Apply 1 application topically 2 (two) times daily.   vitamin B-12 (CYANOCOBALAMIN) 1000 MCG tablet Take 1,000 mcg by mouth every evening.    warfarin (COUMADIN) 6 MG tablet Take 3-6 mg by mouth See admin instructions. Take 6 mg by mouth daily on Monday, Wednesday  Take 3 mg by mouth daily on all other days.   zolpidem (AMBIEN) 5 MG tablet Take 5 mg by mouth at bedtime as needed.   No current facility-administered medications for this visit. (Other)   REVIEW OF SYSTEMS: ROS   Positive for: Neurological, Musculoskeletal, Cardiovascular, Eyes, Respiratory Negative for: Constitutional, Gastrointestinal, Skin, Genitourinary, HENT,  Endocrine, Psychiatric, Allergic/Imm, Heme/Lymph Last edited by Parthenia Ames, COT on 03/19/2022  9:38 AM.     ALLERGIES Allergies  Allergen Reactions   Amlodipine Swelling   Cephalexin Other (See Comments)    unknown   Ciprofloxacin Nausea And Vomiting   Codeine Other (See Comments)    unknown   Metoclopramide Hcl Other (See Comments)    unknown   Nitrofuran Derivatives Other (See Comments)    unknown   Rosuvastatin     Other reaction(s): Myalgias   PAST MEDICAL HISTORY Past Medical History:  Diagnosis Date   Arthritis    right   Atrial  fibrillation (HCC)    GERD (gastroesophageal reflux disease)    hx of   Hepatitis C    Humerus fracture    Hyperlipidemia    Hypertension    Irritable bowel syndrome    Meningioma (HCC)    PONV (postoperative nausea and vomiting)    Past Surgical History:  Procedure Laterality Date   BREAST SURGERY     lumpectomy   CARDIOVASCULAR STRESS TEST  11/14/2006   EF 67%   COLONOSCOPY W/ BIOPSIES AND POLYPECTOMY     ORIF HUMERUS FRACTURE Right 04/18/2014   Procedure: OPEN REDUCTION INTERNAL FIXATION (ORIF) RIGHT PROXIMAL HUMERUS FRACTURE;  Surgeon: Netta Cedars, MD;  Location: [place];  Service: Orthopedics;  Laterality: Right;   ORIF PERIPROSTHETIC FRACTURE Left 04/30/2013   Procedure: OPEN REDUCTION INTERNAL FIXATION (ORIF) PERIPROSTHETIC FRACTURE;  Surgeon: [name], MD;  Location: [place];  Service: Orthopedics;  Laterality: Left;   PAROTID GLAND TUMOR EXCISION     TOTAL HIP ARTHROPLASTY  11/07   left   TOTAL KNEE ARTHROPLASTY     TOTAL KNEE ARTHROPLASTY Left 11/24/2017   Procedure: TOTAL KNEE ARTHROPLASTY;  Surgeon: Gaynelle Arabian, MD;  Location: [facility];  Service: Orthopedics;  Laterality: Left;  64mn   TRANSTHORACIC ECHOCARDIOGRAM  10/25/2009   EF 55-60%   FAMILY HISTORY Family History  Problem Relation Age of Onset   Heart disease Brother    Breast cancer Neg Hx    SOCIAL HISTORY Social History   Tobacco Use   Smoking status: Former    Packs/day: 0.30    Years: 15.00    Total pack years: 4.50    Types: Cigarettes    Quit date: 06/07/1980    Years since quitting: 41.8   Smokeless tobacco: Never  Vaping Use   Vaping Use: Never used  Substance Use Topics   Alcohol use: No   Drug use: No       OPHTHALMIC EXAM:  Base Eye Exam     Visual Acuity (Snellen - Linear)       Right Left   Dist cc 20/150 20/100   Dist ph cc NI NI         Tonometry (Tonopen, 9:42 AM)       Right Left   Pressure 15 15         Pupils       Pupils Dark Light Shape React APD    Right PERRL 4 3 Round Brisk None   Left PERRL 4 3 Round Brisk None         Visual Fields       Left Right    Full Full         Extraocular Movement       Right Left    Full, Ortho Full, Ortho         Neuro/Psych  Oriented x3: Yes   Mood/Affect: Normal         Dilation     Both eyes: 2.5% Phenylephrine @ 9:42 AM           Slit Lamp and Fundus Exam     Slit Lamp Exam       Right Left   Lids/Lashes Dermatochalasis - upper lid, Meibomian gland dysfunction, Chalazion LL Dermatochalasis - upper lid, Meibomian gland dysfunction   Conjunctiva/Sclera white, quiet white, quiet   Cornea +flecks; mild haze, well healed cataract wounds, 2-3+ fine Punctate epithelial erosions, irregular epi, arcus, mild Descemet's folds +flecks; well healed cataract wounds, 2+ Punctate epithelial erosions, tear film debris, irregular epi, arcus, trace Descemet's folds   Anterior Chamber deep; clear deep; clear   Iris round dilated round dilated   Lens PCIOL; open PC PCIOL; open PC   Anterior Vitreous syneresis syneresis         Fundus Exam       Right Left   Disc mild pallor; sharp rim mild pallor; sharp rim, mild PPA   C/D Ratio 0.2 0.1   Macula flat; drusen; central PEDs; blunted foveal reflex; RPE mottling, clumping and early atrophy, no heme or edema flat; blunted foveal reflex; drusen; RPE mottling and clumping, mild PEDs, no heme or edema   Vessels attenuated, Tortuous mild tortuosity   Periphery attached; reticular degen; +midzonal drusen, No heme attached; reticular degen; midzonal drusen, No heme           Refraction     Wearing Rx       Sphere Cylinder Axis Add   Right -0.50 +1.25 010 +3.00   Left -0.75 +2.25 006 +3.00    Type: prog           IMAGING AND PROCEDURES  Imaging and Procedures for 03/19/2022  OCT, Retina - OU - Both Eyes       Right Eye Quality was borderline. Central Foveal Thickness: 323. Progression has been stable. Findings  include no IRF, no SRF, abnormal foveal contour, retinal drusen , subretinal hyper-reflective material, intraretinal hyper-reflective material, pigment epithelial detachment, outer retinal atrophy (Mild interval reduction in central PED height, persistent surrounding SRHM).   Left Eye Quality was borderline. Central Foveal Thickness: 302. Progression has been stable. Findings include normal foveal contour, no IRF, no SRF, retinal drusen , pigment epithelial detachment, outer retinal atrophy (Patchy ORA).   Notes *Images captured and stored on drive  Diagnosis / Impression:  Non-exu ARMD w/ central PEDs OU (OD>OS) OD: Mild interval reduction in central PED height, persistent surrounding SRHM  Clinical management:  See below  Abbreviations: NFP - Normal foveal profile. CME - cystoid macular edema. PED - pigment epithelial detachment. IRF - intraretinal fluid. SRF - subretinal fluid. EZ - ellipsoid zone. ERM - epiretinal membrane. ORA - outer retinal atrophy. ORT - outer retinal tubulation. SRHM - subretinal hyper-reflective material. IRHM - intraretinal hyper-reflective material            ASSESSMENT/PLAN:    ICD-10-CM   1. Intermediate stage nonexudative age-related macular degeneration of both eyes  H35.3132 OCT, Retina - OU - Both Eyes    2. Fleck corneal dystrophy  H18.599     3. Essential hypertension  I10     4. Hypertensive retinopathy of both eyes  H35.033     5. Pseudophakia of both eyes  Z96.1      1. Age related macular degeneration, non-exudative, OU -- stable  - Intermediate stage with prominent central  PEDs OU (OD > OS)  - BCVA 20/150 OD (decreased from 20/70--mostly corneal), 20/100 OS (decreased from 20/60)   - OCT shows OD: mild interval reduction in central PED height, persistent surrounding SRHM; OS: patchy ORA  - cont AREDS 2 supplementation and amsler grid monitoring  - f/u 6 months DFE, OCT  2. Fleck corneal dystrophy OU  - now under the expert  management of Dr. Delman Cheadle  - pt has not been back to [facility] in some time  - recommend f/u at [place]  3,4. Hypertensive retinopathy OU - discussed importance of tight BP control - continue to monitor  5. Pseudophakia OU  - s/p CE/IOL OU  - IOLs in good position, doing well  - continue to monitor  Ophthalmic Meds Ordered this visit:  No orders of the defined types were placed in this encounter.    Return in about 6 months (around 09/19/2022) for f/u non-exu ARMD OU, DFE, OCT.  There are no Patient Instructions on file for this visit.   Explained the diagnoses, plan, and follow up with the patient and they expressed understanding.  Patient expressed understanding of the importance of proper follow up care.    This document serves as a record of services personally performed by Gardiner Sleeper, MD, PhD. It was created on their behalf by Renaldo Reel, [place] an ophthalmic technician. The creation of this record is the provider's dictation and/or activities during the visit.    Electronically signed by:  Renaldo Reel, COT 02.27.24 5:19 PM  This document serves as a record of services personally performed by Gardiner Sleeper, MD, PhD. It was created on their behalf by San Jetty. Owens Shark, OA an ophthalmic technician. The creation of this record is the provider's dictation and/or activities during the visit.    Electronically signed by: San Jetty. Marguerita Merles 03.12.2024 5:19 PM  Gardiner Sleeper, M.D., Ph.D. Diseases & Surgery of the Retina and Vitreous [facility]  I have reviewed the above documentation for accuracy and completeness, and I agree with the above. Gardiner Sleeper, M.D., Ph.D. 03/19/22 5:20 PM   Abbreviations: M myopia (nearsighted); A astigmatism; H hyperopia (farsighted); P presbyopia; Mrx spectacle prescription;  CTL contact lenses; OD right eye; OS left eye; OU both eyes  XT exotropia; ET esotropia; PEK punctate epithelial keratitis;  PEE punctate epithelial erosions; DES dry eye syndrome; MGD meibomian gland dysfunction; ATs artificial tears; PFAT's preservative free artificial tears; [place] nuclear sclerotic cataract; PSC posterior subcapsular cataract; ERM epi-retinal membrane; PVD posterior vitreous detachment; RD retinal detachment; DM diabetes mellitus; DR diabetic retinopathy; NPDR non-proliferative diabetic retinopathy; PDR proliferative diabetic retinopathy; CSME clinically significant macular edema; DME diabetic macular edema; dbh dot blot hemorrhages; CWS cotton wool spot; POAG primary open angle glaucoma; C/D cup-to-disc ratio; HVF humphrey visual field; GVF goldmann visual field; OCT optical coherence tomography; IOP intraocular pressure; BRVO Branch retinal vein occlusion; CRVO central retinal vein occlusion; CRAO central retinal artery occlusion; BRAO branch retinal artery occlusion; RT retinal tear; SB scleral buckle; PPV pars plana vitrectomy; VH Vitreous hemorrhage; PRP panretinal laser photocoagulation; IVK intravitreal kenalog; VMT vitreomacular traction; MH Macular hole;  NVD neovascularization of the disc; NVE neovascularization elsewhere; AREDS age related eye disease study; ARMD age related macular degeneration; POAG primary open angle glaucoma; EBMD epithelial/anterior basement membrane dystrophy; ACIOL anterior chamber intraocular lens; IOL intraocular lens; PCIOL posterior chamber intraocular lens; Phaco/IOL phacoemulsification with intraocular lens placement; PRK photorefractive keratectomy; LASIK laser assisted in situ  keratomileusis; HTN hypertension; DM diabetes mellitus; COPD chronic obstructive pulmonary disease

## 2022-03-19 ENCOUNTER — Ambulatory Visit (INDEPENDENT_AMBULATORY_CARE_PROVIDER_SITE_OTHER): Payer: Medicare Other | Admitting: Ophthalmology

## 2022-03-19 ENCOUNTER — Encounter (INDEPENDENT_AMBULATORY_CARE_PROVIDER_SITE_OTHER): Payer: Self-pay | Admitting: Ophthalmology

## 2022-03-19 DIAGNOSIS — H353132 Nonexudative age-related macular degeneration, bilateral, intermediate dry stage: Secondary | ICD-10-CM | POA: Diagnosis not present

## 2022-03-19 DIAGNOSIS — I10 Essential (primary) hypertension: Secondary | ICD-10-CM | POA: Diagnosis not present

## 2022-03-19 DIAGNOSIS — H18599 Other hereditary corneal dystrophies, unspecified eye: Secondary | ICD-10-CM | POA: Diagnosis not present

## 2022-03-19 DIAGNOSIS — Z961 Presence of intraocular lens: Secondary | ICD-10-CM

## 2022-03-19 DIAGNOSIS — H35033 Hypertensive retinopathy, bilateral: Secondary | ICD-10-CM

## 2022-03-22 NOTE — Progress Notes (Addendum)
Cardiology Office Note:    Date:  03/25/2022   ID:  Gloria [name], DOB [birth year], MRN [id number]  PCP:  Garlan Fillers, MD   [facility] Providers Cardiologist:  Peter Swaziland, MD     Referring MD: Garlan Fillers, MD   No chief complaint on file.   History of Present Illness:    Gloria [name] is a 89 y.o. female with a hx of HTN, PAF (coumadin), TIA, moderate TR, moderate AS, carotid artery stenosis, bradycardia, palpitations, GERD.  She is a long-term patient of Dr. Elvis Coil, primarily evaluated for management of her atrial fibrillation.  She had an echo in 2018 which showed an EF of 55 to 60%, with mild LVH.  She had a myocardial perfusion study in 2018 which was overall normal, low risk study.  She had called carotid ultrasound in 2018 which showed mild bilateral plaque, repeated in 2020 without significant change.  Repeat echo on 06/17/2020 showed an EF of 60 to 65%, mild concentric LVH, grade 2 DD, severe developed Joslyn Devon of her left atria, trivial MR, mild to moderate aortic valve sclerosis, aortic dilatation at 38 mm.  She wore a long-term monitor in July 2023 which showed predominantly NSR, occasional PAC, rare PVC, PVC burden 1%.  Most recently was evaluated in office with Sharlene Dory, NP, she was doing well from a cardiac perspective and her palpitations were not as bothersome. Had recent stress that she endorsed was contributing to her HTN.   She was evaluated in the ED on 02/21/2022 after her recent visit with her PCP who had obtained a chest x-ray which noted small pleural effusions.  She was positive for flu A, BNP had been drawn which was elevated at > 700 however she had no signs of volume overload.  She was ultimately discharged home.  She presents today for follow-up of her atrial fibrillation.  She is recovered from her recent COVID and flu A  and feeling well from that perspective.  She continues to note palpitations frequently and has to take her  as needed metoprolol dose. She denies chest pain, palpitations, dyspnea, pnd, orthopnea, n, v, dizziness, syncope, edema, weight gain, or early satiety.  She denies hematuria, hematochezia, or hemoptysis.   Past Medical History:  Diagnosis Date   Arthritis    right   Atrial fibrillation (HCC)    GERD (gastroesophageal reflux disease)    hx of   Hepatitis C    Humerus fracture    Hyperlipidemia    Hypertension    Irritable bowel syndrome    Meningioma (HCC)    PONV (postoperative nausea and vomiting)     Past Surgical History:  Procedure Laterality Date   BREAST SURGERY     lumpectomy   CARDIOVASCULAR STRESS TEST  11/14/2006   EF 67%   COLONOSCOPY W/ BIOPSIES AND POLYPECTOMY     ORIF HUMERUS FRACTURE Right 04/18/2014   Procedure: OPEN REDUCTION INTERNAL FIXATION (ORIF) RIGHT PROXIMAL HUMERUS FRACTURE;  Surgeon: Beverely Low, MD;  Location: [facility] OR;  Service: Orthopedics;  Laterality: Right;   ORIF PERIPROSTHETIC FRACTURE Left 04/30/2013   Procedure: OPEN REDUCTION INTERNAL FIXATION (ORIF) PERIPROSTHETIC FRACTURE;  Surgeon: Shelda Pal, MD;  Location: [facility] OR;  Service: Orthopedics;  Laterality: Left;   PAROTID GLAND TUMOR EXCISION     TOTAL HIP ARTHROPLASTY  11/07   left   TOTAL KNEE ARTHROPLASTY     TOTAL KNEE ARTHROPLASTY Left 11/24/2017   Procedure: TOTAL KNEE ARTHROPLASTY;  Surgeon: Lequita Halt,  Homero Fellers, MD;  Location: [facility] ORS;  Service: Orthopedics;  Laterality: Left;    TRANSTHORACIC ECHOCARDIOGRAM  10/25/2009   EF 55-60%    Current Medications: Current Meds  Medication Sig   acetaminophen (TYLENOL) 325 MG tablet Take 2 tablets (650 mg total) by mouth every 6 (six) hours as needed for mild pain (or Fever >/= 101).   atorvastatin (LIPITOR) 20 MG tablet Take 1 tablet (20 mg total) by mouth at bedtime.   ergocalciferol (VITAMIN D2) 1.25 MG (50000 UT) capsule Take 50,000 Units by mouth every Friday.   gabapentin (NEURONTIN) 100 MG capsule Take 100 mg by mouth daily as needed  (pain).   hydrALAZINE (APRESOLINE) 25 MG tablet TAKE 1 TABLET BY MOUTH THREE TIMES DAILY   losartan (COZAAR) 25 MG tablet Take 1 tablet by mouth once daily   Melatonin 10 MG CHEW Chew 10 mg by mouth at bedtime as needed.   metoprolol succinate (TOPROL-XL) 50 MG 24 hr tablet Take 1 tablet (50 mg total) by mouth daily. Take with or immediately following a meal.   metoprolol tartrate (LOPRESSOR) 25 MG tablet Take 0.5 tablets (12.5 mg total) by mouth 2 (two) times daily as needed (palpitations).   Multiple Vitamins-Minerals (PRESERVISION AREDS PO) Take 1 capsule by mouth 2 (two) times daily.   omeprazole (PRILOSEC) 40 MG capsule Take 40 mg by mouth 2 (two) times daily.   Polyethyl Glycol-Propyl Glycol (SYSTANE) 0.4-0.3 % SOLN Place 1 drop into both eyes daily as needed.   polyethylene glycol (MIRALAX) 17 g packet Take 17 g by mouth daily.   predniSONE (STERAPRED UNI-PAK 21 TAB) 5 MG (21) TBPK tablet 5 mg.   PROCTO-MED HC 2.5 % rectal cream Apply 1 application topically 2 (two) times daily.   vitamin B-12 (CYANOCOBALAMIN) 1000 MCG tablet Take 1,000 mcg by mouth every evening.    warfarin (COUMADIN) 6 MG tablet Take 3-6 mg by mouth See admin instructions. Take 6 mg by mouth daily on Monday, Wednesday  Take 3 mg by mouth daily on all other days.     Allergies:   Amlodipine, Cephalexin, Ciprofloxacin, Codeine, Metoclopramide hcl, Nitrofuran derivatives, and Rosuvastatin   Social History   Socioeconomic History   Marital status: Widowed    Spouse name: Not on file   Number of children: 2   Years of education: Not on file   Highest education level: Not on file  Occupational History    Employer: RETIRED  Tobacco Use   Smoking status: Former    Packs/day: 0.30    Years: 15.00    Additional pack years: 0.00    Total pack years: 4.50    Types: Cigarettes    Quit date: 06/07/1980    Years since quitting: 41.8   Smokeless tobacco: Never  Vaping Use   Vaping Use: Never used  Substance and  Sexual Activity   Alcohol use: No   Drug use: No   Sexual activity: Never  Other Topics Concern   Not on file  Social History Narrative   Not on file   Social Determinants of Health   Financial Resource Strain: Not on file  Food Insecurity: Not on file  Transportation Needs: Not on file  Physical Activity: Not on file  Stress: Not on file  Social Connections: Not on file     Family History: The patient's family history includes Heart disease in her brother. There is no history of Breast cancer.  ROS:   Please see the history of present illness.  All other systems reviewed and are negative.  EKGs/Labs/Other Studies Reviewed:    The following studies were reviewed today:  14-day ZIO monitor on July 31, 2021:  Normal sinus rhythm   Occasional PACs.   Rare PVCs   SVT longest episode 28 minutes with rate 107 bpm.   No definite evidence of AFib   No significant pauses or AV block.  2D echocardiogram on June 17, 2020:  1. Left ventricular ejection fraction, by estimation, is 60 to 65%. The  left ventricle has normal function. The left ventricle has no regional  wall motion abnormalities. There is mild concentric left ventricular  hypertrophy. Left ventricular diastolic  parameters are consistent with Grade II diastolic dysfunction  (pseudonormalization). Elevated left ventricular end-diastolic pressure.   2. Right ventricular systolic function is normal. The right ventricular  size is normal. Tricuspid regurgitation signal is inadequate for assessing  PA pressure.   3. Left atrial size was severely dilated.   4. The mitral valve is normal in structure. Trivial mitral valve  regurgitation. No evidence of mitral stenosis.   5. The aortic valve is tricuspid. Aortic valve regurgitation is not  visualized. Mild to moderate aortic valve sclerosis/calcification is  present, without any evidence of aortic stenosis. Aortic valve area, by  VTI measures 1.82 cm. Aortic valve  mean  gradient measures 9.0 mmHg. Aortic valve Vmax measures 2.08 m/s.   6. Aortic dilatation noted. There is mild dilatation of the aortic root,  measuring 38 mm.   7. The inferior vena cava is normal in size with greater than 50%  respiratory variability, suggesting right atrial pressure of 3 mmHg.      Nuclear Medicine stress test on 06/05/2016: The study is normal. This is a low risk study. The left ventricular ejection fraction is hyperdynamic (>65%). There was no ST segment deviation noted during stress.   Normal resting and stress perfusion. No ischemia or infarction EF 72%  EKG:  EKG is  ordered today.  The ekg ordered today demonstrates atrial fibrillation, heart rate 103 bpm.  Recent Labs: 10/10/2021: Magnesium 2.1 02/21/2022: ALT 18; B Natriuretic Peptide 759.1; BUN 8; Creatinine, Ser 0.79; Hemoglobin 12.2; Platelets 232; Potassium 3.4; Sodium 139  Recent Lipid Panel    Component Value Date/Time   CHOL 178 07/14/2016 0536   TRIG 69 07/14/2016 0536   HDL 58 07/14/2016 0536   CHOLHDL 3.1 07/14/2016 0536   VLDL 14 07/14/2016 0536   LDLCALC 106 (H) 07/14/2016 0536     Risk Assessment/Calculations:    CHA2DS2-VASc Score = 5   This indicates a 7.2% annual risk of stroke. The patient's score is based upon: CHF History: 0 HTN History: 1 Diabetes History: 0 Stroke History: 0 Vascular Disease History: 1 Age Score: 2 Gender Score: 1               Physical Exam:    VS:  BP 134/70 Comment: home bp today  Pulse (!) 103   Ht 5\' 2"  (1.575 m)   Wt 139 lb 3.2 oz (63.1 kg)   SpO2 98%   BMI 25.46 kg/m     Wt Readings from Last 3 Encounters:  03/25/22 139 lb 3.2 oz (63.1 kg)  02/21/22 142 lb 3.2 oz (64.5 kg)  09/26/21 142 lb 3.2 oz (64.5 kg)     GEN:  Well nourished, well developed in no acute distress HEENT: Normal NECK: No JVD; No carotid bruits LYMPHATICS: No lymphadenopathy CARDIAC: irregular rhythm, 2/6 murmur noted LSB, rubs, gallops  RESPIRATORY:   Clear to auscultation without rales, wheezing or rhonchi  ABDOMEN: Soft, non-tender, non-distended MUSCULOSKELETAL:  No edema; No deformity  SKIN: Warm and dry NEUROLOGIC:  Alert and oriented x 3 PSYCHIATRIC:  Normal affect   ASSESSMENT:    1. Palpitations   2. PAF (paroxysmal atrial fibrillation) (HCC)   3. Essential hypertension   4. Carotid artery disease, unspecified laterality, unspecified type (HCC)    PLAN:    In order of problems listed above:  Palpitations - Zio monitor 07/01/21 showed predominately SR with PACs and rare PVCs.  These continue to be bothersome for her and she frequently takes her as needed dose of metoprolol. Will start Toprol per below for her uncontrolled AF.   PAF -atrial fibrillation noted on EKG today, rate uncontrolled at 103, was previously elevated in the 110's during her recent ED visit as well.  Will start her on Toprol 50 mg daily. CHA2DS2-VASc Score = 5 [CHF History: 0, HTN History: 1, Diabetes History: 0, Stroke History: 0, Vascular Disease History: 1, Age Score: 2, Gender Score: 1].  Therefore, the patient's annual risk of stroke is 7.2 %.  Continue warfarin.   HTN -blood pressure today was elevated at 154/98, she advised this morning in her room blood pressure was 134/70.  Continue current antihypertensive medication regimen.  Adding Toprol per above.  Carotid artery disease - carotid US 10/01/18 showed mild bilateral ICA stenosis. Discussed repeating her carotid US, but she does not want to at this time.   Disposition - repeat BMET, start Toprol 50 mg daily, return in 6 months.            Medication Adjustments/Labs and Tests Ordered: Current medicines are reviewed at length with the patient today.  Concerns regarding medicines are outlined above.  Orders Placed This Encounter  Procedures   Basic metabolic panel   EKG 12-Lead   Meds ordered this encounter  Medications   metoprolol succinate (TOPROL-XL) 50 MG 24 hr tablet    Sig: Take 1  tablet (50 mg total) by mouth daily. Take with or immediately following a meal.    Dispense:  30 tablet    Refill:  3    Patient Instructions  Medication Instructions:  START METOPROLOL 50MG  DAILY *If you need a refill on your cardiac medications before your next appointment, please call your pharmacy*  Lab Work: BMET TODAY If you have labs (blood work) drawn today and your tests are completely normal, you will receive your results only by:  MyChart Message (if you have MyChart) OR  A paper copy in the mail If you have any lab test that is abnormal or we need to change your treatment, we will call you to review the results.  Testing/Procedures: NONE  Follow-Up: At [facility], you and your health needs are our priority.  As part of our continuing mission to provide you with exceptional heart care, we have created designated Provider Care Teams.  These Care Teams include your primary Cardiologist (physician) and Advanced Practice Providers (APPs -  Physician Assistants and Nurse Practitioners) who all work together to provide you with the care you need, when you need it.  We recommend signing up for the patient portal called "MyChart".  Sign up information is provided on this After Visit Summary.  MyChart is used to connect with patients for Virtual Visits (Telemedicine).  Patients are able to view lab/test results, encounter notes, upcoming appointments, etc.  Non-urgent messages can be sent to your provider  as well.   To learn more about what you can do with MyChart, go to [email or web address].    Your next appointment:   6 month(s)  Provider:   Peter Swaziland, MD     Other Instructions     Signed, Flossie Dibble, NP  03/25/2022 12:55 PM    [facility]

## 2022-03-25 ENCOUNTER — Encounter: Payer: Self-pay | Admitting: Cardiology

## 2022-03-25 ENCOUNTER — Ambulatory Visit: Payer: Medicare Other | Attending: Cardiology | Admitting: Cardiology

## 2022-03-25 VITALS — BP 134/70 | HR 103 | Ht 62.0 in | Wt 139.2 lb

## 2022-03-25 DIAGNOSIS — I480 Paroxysmal atrial fibrillation: Secondary | ICD-10-CM

## 2022-03-25 DIAGNOSIS — R002 Palpitations: Secondary | ICD-10-CM | POA: Diagnosis not present

## 2022-03-25 DIAGNOSIS — I779 Disorder of arteries and arterioles, unspecified: Secondary | ICD-10-CM

## 2022-03-25 DIAGNOSIS — I10 Essential (primary) hypertension: Secondary | ICD-10-CM | POA: Diagnosis not present

## 2022-03-25 MED ORDER — METOPROLOL SUCCINATE ER 50 MG PO TB24: 50.0000 mg | ORAL_TABLET | Freq: Every day | ORAL | 3 refills | Status: DC

## 2022-03-25 NOTE — Patient Instructions (Signed)
Medication Instructions:  START METOPROLOL 50MG  DAILY *If you need a refill on your cardiac medications before your next appointment, please call your pharmacy*  Lab Work: BMET TODAY If you have labs (blood work) drawn today and your tests are completely normal, you will receive your results only by:  [place] (if you have MyChart) OR  A paper copy in the mail If you have any lab test that is abnormal or we need to change your treatment, we will call you to review the results.  Testing/Procedures: NONE  Follow-Up: At [facility], you and your health needs are our priority.  As part of our continuing mission to provide you with exceptional heart care, we have created designated Provider Care Teams.  These Care Teams include your primary Cardiologist (physician) and Advanced Practice Providers (APPs -  Physician Assistants and Nurse Practitioners) who all work together to provide you with the care you need, when you need it.  We recommend signing up for the patient portal called "MyChart".  Sign up information is provided on this After Visit Summary.  MyChart is used to connect with patients for Virtual Visits (Telemedicine).  Patients are able to view lab/test results, encounter notes, upcoming appointments, etc.  Non-urgent messages can be sent to your provider as well.   To learn more about what you can do with MyChart, go to [email or web address].    Your next appointment:   6 month(s)  Provider:   Peter Martinique, MD     Other Instructions

## 2022-03-26 ENCOUNTER — Telehealth: Payer: Self-pay

## 2022-03-26 LAB — BASIC METABOLIC PANEL WITH GFR
BUN/Creatinine Ratio: 28 (ref 12–28)
BUN: 22 mg/dL (ref 8–27)
CO2: 18 mmol/L — ABNORMAL LOW (ref 20–29)
Calcium: 9.6 mg/dL (ref 8.7–10.3)
Chloride: 104 mmol/L (ref 96–106)
Creatinine, Ser: 0.78 mg/dL (ref 0.57–1.00)
Glucose: 109 mg/dL — ABNORMAL HIGH (ref 70–99)
Potassium: 4.3 mmol/L (ref 3.5–5.2)
Sodium: 140 mmol/L (ref 134–144)
eGFR: 73 mL/min/1.73

## 2022-03-26 NOTE — Telephone Encounter (Signed)
-----   Message from Trudi Ida, NP sent at 03/26/2022  8:54 AM EDT ----- Ms. Maturino,  Your repeat blood work was normal, potassium was back in range. Good results! Please let us know if you notice any dizziness, or low BP readings after you start your Toprol. Best, Anderson Malta

## 2022-03-26 NOTE — Telephone Encounter (Signed)
Patient is aware of lab results and provider recommendations. Verbalized understanding.

## 2022-06-18 ENCOUNTER — Other Ambulatory Visit: Payer: Self-pay | Admitting: Family Medicine

## 2022-06-18 DIAGNOSIS — M542 Cervicalgia: Secondary | ICD-10-CM

## 2022-06-20 ENCOUNTER — Ambulatory Visit
Admission: RE | Admit: 2022-06-20 | Discharge: 2022-06-20 | Disposition: A | Discharge status: Home / Self Care | Payer: Medicare Other | Source: Ambulatory Visit | Attending: Family Medicine | Admitting: Family Medicine

## 2022-06-20 DIAGNOSIS — M542 Cervicalgia: Secondary | ICD-10-CM

## 2022-08-12 ENCOUNTER — Other Ambulatory Visit: Payer: Self-pay | Admitting: Cardiology

## 2022-08-19 ENCOUNTER — Other Ambulatory Visit: Payer: Self-pay

## 2022-08-19 MED ORDER — METOPROLOL SUCCINATE ER 50 MG PO TB24: 50.0000 mg | ORAL_TABLET | Freq: Every day | ORAL | 7 refills | Status: DC

## 2022-09-18 ENCOUNTER — Encounter (INDEPENDENT_AMBULATORY_CARE_PROVIDER_SITE_OTHER): Payer: Medicare Other | Admitting: Ophthalmology

## 2022-09-19 ENCOUNTER — Encounter (INDEPENDENT_AMBULATORY_CARE_PROVIDER_SITE_OTHER): Payer: Medicare Other | Admitting: Ophthalmology

## 2022-09-27 NOTE — Progress Notes (Signed)
[facility] - Clinic Note  10/02/2022    CHIEF COMPLAINT Patient presents for Retina Follow Up   HISTORY OF PRESENT ILLNESS: Gloria [name] is a 89 y.o. female who presents to the clinic today for:   HPI     Retina Follow Up   Patient presents with  Dry AMD.  In both eyes.  This started 6 months ago.  I, the attending physician,  performed the HPI with the patient and updated documentation appropriately.        Comments   Patient here for 6 months retina follow up for non exu ARMD OU. Patient states vision not too good. Saw Dr Randon Goldsmith. Gave a new RX for glasses but it wouldn't be any better than the glasses she has now. No eye pain. Using systane and refresh drops PRN.      Last edited by Rennis Chris, MD on 10/02/2022 10:51 AM.    Patient feels the vision is not good. Received injection of steroid in C1/C2 due to neck pain and stiffness.   Referring physician: Garlan Fillers, MD [address] [place],  [place] [address]  HISTORICAL INFORMATION:   Selected notes from the MEDICAL RECORD NUMBER Referred by Dr. Dagoberto Ligas; now sees Dr. Randon Goldsmith LEE: 06-28-19 Ocular Hx- ARMD OU   CURRENT MEDICATIONS: Current Outpatient Medications (Ophthalmic Drugs)  Medication Sig   Polyethyl Glycol-Propyl Glycol (SYSTANE) 0.4-0.3 % SOLN Place 1 drop into both eyes daily as needed.   No current facility-administered medications for this visit. (Ophthalmic Drugs)   Current Outpatient Medications (Other)  Medication Sig   acetaminophen (TYLENOL) 325 MG tablet Take 2 tablets (650 mg total) by mouth every 6 (six) hours as needed for mild pain (or Fever >/= 101).   atorvastatin (LIPITOR) 20 MG tablet Take 1 tablet (20 mg total) by mouth at bedtime.   ergocalciferol (VITAMIN D2) 1.25 MG (50000 UT) capsule Take 50,000 Units by mouth every Friday.   gabapentin (NEURONTIN) 100 MG capsule Take 100 mg by mouth daily as needed (pain).   hydrALAZINE (APRESOLINE) 25 MG tablet  TAKE 1 TABLET BY MOUTH THREE TIMES DAILY   losartan (COZAAR) 25 MG tablet Take 1 tablet by mouth once daily   Melatonin 10 MG CHEW Chew 10 mg by mouth at bedtime as needed.   metoprolol succinate (TOPROL-XL) 50 MG 24 hr tablet Take 1 tablet (50 mg total) by mouth daily. Take with or immediately following a meal.   metoprolol tartrate (LOPRESSOR) 25 MG tablet Take 0.5 tablets (12.5 mg total) by mouth 2 (two) times daily as needed (palpitations).   Multiple Vitamins-Minerals (PRESERVISION AREDS PO) Take 1 capsule by mouth 2 (two) times daily.   omeprazole (PRILOSEC) 40 MG capsule Take 40 mg by mouth 2 (two) times daily.   polyethylene glycol (MIRALAX) 17 g packet Take 17 g by mouth daily.   predniSONE (STERAPRED UNI-PAK 21 TAB) 5 MG (21) TBPK tablet 5 mg.   PROCTO-MED HC 2.5 % rectal cream Apply 1 application topically 2 (two) times daily.   vitamin B-12 (CYANOCOBALAMIN) 1000 MCG tablet Take 1,000 mcg by mouth every evening.    warfarin (COUMADIN) 6 MG tablet Take 3-6 mg by mouth See admin instructions. Take 6 mg by mouth daily on Monday, Wednesday  Take 3 mg by mouth daily on all other days.   enoxaparin (LOVENOX) 40 MG/0.4ML injection Inject 40 mg into the skin. (Patient not taking: Reported on 03/25/2022)   nitroGLYCERIN (NITROSTAT) 0.4 MG SL tablet  Place 1 tablet (0.4 mg total) under the tongue every 5 (five) minutes as needed for chest pain. (Patient not taking: Reported on 03/25/2022)   zolpidem (AMBIEN) 5 MG tablet Take 5 mg by mouth at bedtime as needed. (Patient not taking: Reported on 03/25/2022)   No current facility-administered medications for this visit. (Other)   REVIEW OF SYSTEMS: ROS   Positive for: Neurological, Musculoskeletal, Cardiovascular, Eyes, Respiratory Negative for: Constitutional, Gastrointestinal, Skin, Genitourinary, HENT, Endocrine, Psychiatric, Allergic/Imm, Heme/Lymph Last edited by Laddie Aquas, COA on 10/02/2022  9:56 AM.      ALLERGIES Allergies   Allergen Reactions   Amlodipine Swelling   Cephalexin Other (See Comments)    unknown   Ciprofloxacin Nausea And Vomiting   Codeine Other (See Comments)    unknown   Metoclopramide Hcl Other (See Comments)    unknown   Nitrofuran Derivatives Other (See Comments)    unknown   Rosuvastatin     Other reaction(s): Myalgias   PAST MEDICAL HISTORY Past Medical History:  Diagnosis Date   Arthritis    right   Atrial fibrillation (HCC)    GERD (gastroesophageal reflux disease)    hx of   Hepatitis C    Humerus fracture    Hyperlipidemia    Hypertension    Irritable bowel syndrome    Meningioma (HCC)    PONV (postoperative nausea and vomiting)    Past Surgical History:  Procedure Laterality Date   BREAST SURGERY     lumpectomy   CARDIOVASCULAR STRESS TEST  11/14/2006   EF 67%   COLONOSCOPY W/ BIOPSIES AND POLYPECTOMY     ORIF HUMERUS FRACTURE Right 04/18/2014   Procedure: OPEN REDUCTION INTERNAL FIXATION (ORIF) RIGHT PROXIMAL HUMERUS FRACTURE;  Surgeon: Beverely Low, MD;  Location: [facility] OR;  Service: Orthopedics;  Laterality: Right;   ORIF PERIPROSTHETIC FRACTURE Left 04/30/2013   Procedure: OPEN REDUCTION INTERNAL FIXATION (ORIF) PERIPROSTHETIC FRACTURE;  Surgeon: Shelda Pal, MD;  Location: [facility] OR;  Service: Orthopedics;  Laterality: Left;   PAROTID GLAND TUMOR EXCISION     TOTAL HIP ARTHROPLASTY  11/07   left   TOTAL KNEE ARTHROPLASTY     TOTAL KNEE ARTHROPLASTY Left 11/24/2017   Procedure: TOTAL KNEE ARTHROPLASTY;  Surgeon: Ollen Gross, MD;  Location: [facility] ORS;  Service: Orthopedics;  Laterality: Left;    TRANSTHORACIC ECHOCARDIOGRAM  10/25/2009   EF 55-60%   FAMILY HISTORY Family History  Problem Relation Age of Onset   Heart disease Brother    Breast cancer Neg Hx    SOCIAL HISTORY Social History   Tobacco Use   Smoking status: Former    Current packs/day: 0.00    Average packs/day: 0.3 packs/day for 15.0 years (4.5 ttl pk-yrs)    Types: Cigarettes     Start date: 06/07/1965    Quit date: 06/07/1980    Years since quitting: 42.3   Smokeless tobacco: Never  Vaping Use   Vaping status: Never Used  Substance Use Topics   Alcohol use: No   Drug use: No       OPHTHALMIC EXAM:  Base Eye Exam     Visual Acuity (Snellen - Linear)       Right Left   Dist [place] 20/100 -2 20/100 -2  Forgot glasses        Tonometry (Tonopen, 9:52 AM)       Right Left   Pressure 12 11         Pupils  Dark Light Shape React APD   Right 4 3 Round Brisk None   Left 4 3 Round Brisk None         Visual Fields (Counting fingers)       Left Right    Full Full         Extraocular Movement       Right Left    Full, Ortho Full, Ortho         Neuro/Psych     Oriented x3: Yes   Mood/Affect: Normal         Dilation     Both eyes: 1.0% Mydriacyl, 2.5% Phenylephrine @ 9:52 AM           Slit Lamp and Fundus Exam     Slit Lamp Exam       Right Left   Lids/Lashes Dermatochalasis - upper lid, Meibomian gland dysfunction, Chalazion LL Dermatochalasis - upper lid, Meibomian gland dysfunction   Conjunctiva/Sclera white, quiet white, quiet   Cornea +flecks; mild haze, well healed cataract wounds, 2-3+ fine Punctate epithelial erosions, irregular epi, arcus, mild Descemet's folds +flecks; well healed cataract wounds, 2+ Punctate epithelial erosions, tear film debris, irregular epi, arcus, trace Descemet's folds   Anterior Chamber deep; clear deep; clear   Iris round dilated round dilated   Lens PCIOL; open PC PCIOL; open PC   Anterior Vitreous syneresis, Posterior vitreous detachment syneresis, Posterior vitreous detachment         Fundus Exam       Right Left   Disc mild pallor; sharp rim mild pallor; sharp rim, mild PPA   C/D Ratio 0.2 0.1   Macula flat; drusen; central PEDs; blunted foveal reflex; RPE mottling, clumping and early atrophy, no heme or edema flat; blunted foveal reflex; drusen; RPE mottling and clumping,  mild PEDs, no heme or edema   Vessels attenuated, Tortuous mild tortuosity, Vascular attenuation   Periphery attached; reticular degen; +midzonal drusen, No heme attached; reticular degen; midzonal drusen, No heme           Refraction     Wearing Rx       Sphere Cylinder Axis Add   Right -0.50 +1.25 010 +3.00   Left -0.75 +2.25 006 +3.00    Type: prog           IMAGING AND PROCEDURES  Imaging and Procedures for 10/02/2022  OCT, Retina - OU - Both Eyes       Right Eye Quality was borderline. Central Foveal Thickness: 332. Progression has been stable. Findings include no IRF, no SRF, abnormal foveal contour, retinal drusen , subretinal hyper-reflective material, intraretinal hyper-reflective material, pigment epithelial detachment, outer retinal atrophy (Stable persistent central PED and [facility]).   Left Eye Quality was borderline. Central Foveal Thickness: 290. Progression has been stable. Findings include normal foveal contour, no IRF, no SRF, retinal drusen , pigment epithelial detachment, outer retinal atrophy (Patchy ORA).   Notes *Images captured and stored on drive  Diagnosis / Impression:  Non-exu ARMD OU OD: Stable persistent central PED and SRHM OS: stable PEDs, patchy ORA  Clinical management:  See below  Abbreviations: NFP - Normal foveal profile. CME - cystoid macular edema. PED - pigment epithelial detachment. IRF - intraretinal fluid. SRF - subretinal fluid. EZ - ellipsoid zone. ERM - epiretinal membrane. ORA - outer retinal atrophy. ORT - outer retinal tubulation. SRHM - subretinal hyper-reflective material. IRHM - intraretinal hyper-reflective material            ASSESSMENT/PLAN:  ICD-10-CM   1. Intermediate stage nonexudative age-related macular degeneration of both eyes  H35.3132 OCT, Retina - OU - Both Eyes    2. Fleck corneal dystrophy  H18.599     3. Essential hypertension  I10     4. Hypertensive retinopathy of both eyes  H35.033      5. Pseudophakia of both eyes  Z96.1      1. Age related macular degeneration, non-exudative, OU -- stable  - Intermediate stage with prominent central PEDs OU (OD > OS) - BCVA 20/100 OD, 20/100 OS -- stable - OCT shows OD: Stable persistent central PED and SRHM; OS: patchy ORA  - cont AREDS 2 supplementation and amsler grid monitoring  - f/u 6 months DFE, OCT  2. Fleck corneal dystrophy OU - now under the expert management of Dr. Randon Goldsmith  - Using Systane OU at bedtime and Refresh OU PRN  3,4. Hypertensive retinopathy OU - discussed importance of tight BP control - continue to monitor  5. Pseudophakia OU  - s/p CE/IOL OU  - IOLs in good position, doing well  - continue to monitor  Ophthalmic Meds Ordered this visit:  No orders of the defined types were placed in this encounter.    Return for 6-9 mos - Non Ex. AMD OU, DFE, OCT.  There are no Patient Instructions on file for this visit.   Explained the diagnoses, plan, and follow up with the patient and they expressed understanding.  Patient expressed understanding of the importance of proper follow up care.    This document serves as a record of services personally performed by Karie Chimera, MD, PhD. It was created on their behalf by De Blanch, an ophthalmic technician. The creation of this record is the provider's dictation and/or activities during the visit.    Electronically signed by: De Blanch, OA, 10/02/22  10:53 AM  This document serves as a record of services personally performed by Karie Chimera, MD, PhD. It was created on their behalf by Charlette Caffey, COT an ophthalmic technician. The creation of this record is the provider's dictation and/or activities during the visit.    Electronically signed by:  Charlette Caffey, COT  10/02/22 10:53 AM  Karie Chimera, M.D., Ph.D. Diseases & Surgery of the Retina and Vitreous [facility]  I have reviewed the above  documentation for accuracy and completeness, and I agree with the above. Karie Chimera, M.D., Ph.D. 10/02/22 10:54 AM   Abbreviations: M myopia (nearsighted); A astigmatism; H hyperopia (farsighted); P presbyopia; Mrx spectacle prescription;  CTL contact lenses; OD right eye; OS left eye; OU both eyes  XT exotropia; ET esotropia; PEK punctate epithelial keratitis; PEE punctate epithelial erosions; DES dry eye syndrome; MGD meibomian gland dysfunction; ATs artificial tears; PFAT's preservative free artificial tears; NSC nuclear sclerotic cataract; PSC posterior subcapsular cataract; ERM epi-retinal membrane; PVD posterior vitreous detachment; RD retinal detachment; DM diabetes mellitus; DR diabetic retinopathy; NPDR non-proliferative diabetic retinopathy; PDR proliferative diabetic retinopathy; CSME clinically significant macular edema; DME diabetic macular edema; dbh dot blot hemorrhages; CWS cotton wool spot; POAG primary open angle glaucoma; C/D cup-to-disc ratio; HVF humphrey visual field; GVF goldmann visual field; OCT optical coherence tomography; IOP intraocular pressure; BRVO Branch retinal vein occlusion; CRVO central retinal vein occlusion; CRAO central retinal artery occlusion; BRAO branch retinal artery occlusion; RT retinal tear; SB scleral buckle; PPV pars plana vitrectomy; VH Vitreous hemorrhage; PRP panretinal laser photocoagulation; IVK intravitreal kenalog; VMT vitreomacular traction;  MH Macular hole;  NVD neovascularization of the disc; NVE neovascularization elsewhere; AREDS age related eye disease study; ARMD age related macular degeneration; POAG primary open angle glaucoma; EBMD epithelial/anterior basement membrane dystrophy; ACIOL anterior chamber intraocular lens; IOL intraocular lens; PCIOL posterior chamber intraocular lens; Phaco/IOL phacoemulsification with intraocular lens placement; PRK photorefractive keratectomy; LASIK laser assisted in situ keratomileusis; HTN hypertension; DM  diabetes mellitus; COPD chronic obstructive pulmonary disease

## 2022-10-01 ENCOUNTER — Other Ambulatory Visit: Payer: Self-pay | Admitting: Internal Medicine

## 2022-10-01 DIAGNOSIS — Z1231 Encounter for screening mammogram for malignant neoplasm of breast: Secondary | ICD-10-CM

## 2022-10-02 ENCOUNTER — Ambulatory Visit (INDEPENDENT_AMBULATORY_CARE_PROVIDER_SITE_OTHER): Payer: Medicare Other | Admitting: Ophthalmology

## 2022-10-02 ENCOUNTER — Encounter (INDEPENDENT_AMBULATORY_CARE_PROVIDER_SITE_OTHER): Payer: Self-pay | Admitting: Ophthalmology

## 2022-10-02 DIAGNOSIS — I10 Essential (primary) hypertension: Secondary | ICD-10-CM

## 2022-10-02 DIAGNOSIS — H353132 Nonexudative age-related macular degeneration, bilateral, intermediate dry stage: Secondary | ICD-10-CM

## 2022-10-02 DIAGNOSIS — Z961 Presence of intraocular lens: Secondary | ICD-10-CM

## 2022-10-02 DIAGNOSIS — H35033 Hypertensive retinopathy, bilateral: Secondary | ICD-10-CM | POA: Diagnosis not present

## 2022-10-02 DIAGNOSIS — H18599 Other hereditary corneal dystrophies, unspecified eye: Secondary | ICD-10-CM | POA: Diagnosis not present

## 2022-10-03 NOTE — Progress Notes (Signed)
Cardiology Office Note:    Date:  10/11/2022   ID:  DEBIE [name], DOB June 13, 1930, MRN [id number]  PCP:  Garlan Fillers, MD   [facility] Providers Cardiologist:  Johnell Bas [place], MD     Referring MD: Garlan Fillers, MD   Chief Complaint  Patient presents with   Atrial Fibrillation     History of Present Illness:    Gloria [name] is a 89 y.o. female with a hx of hypertension, hyperlipidemia, GERD and PAF.  Patient was admitted in May 2018 with chest pain and shortness of breath, serial troponin was negative.  Echocardiogram showed moderate LVH, LVEF with normal systolic function.  Myoview was normal.  She had lower extremity swelling and a rash with amlodipine.  Carotid Doppler in September 2020 showed less than 39% stenosis bilaterally.  More recently, she was tested positive for COVID on 06/01/2020.  Patient presented to the hospital on 06/16/2020 with dizziness.  On admission she was bradycardic, flecainide was held.  Blood pressure was over 200 in the hospital.  HCTZ was discontinued and she was started on hydralazine.  MRI was negative for acute abnormality.  CTA of the head and neck was negative for acute abnormality as well.  Patient was assessed by PT for vestibular evaluation noted to have posterior left head roll and was treated with Epley maneuver.  She was hydrated.  Echocardiogram obtained on 06/17/2020 showed EF 60 to 65%, grade 2 DD, mild to moderate aortic valve sclerosis without evidence of aortic stenosis, mild dilatation of the aortic root measuring at 38 mm.  She was seen in follow up on 06/23/20 by Azalee Course PA-C and her Dizziness had improved but she still has occasional dizziness.  Her BP was elevated and hydralazine dose was increased to tid. She had some palpitations but deferred further evaluation at that time. She wore a long-term monitor in July 2023 which showed predominantly NSR, occasional PAC, rare PVC, PVC burden 1%.   She was evaluated in the ED on  02/21/2022 after her recent visit with her PCP who had obtained a chest x-ray which noted small pleural effusions.  She was positive for flu A, BNP had been drawn which was elevated at > 700 however she had no signs of volume overload.  She was ultimately discharged home. Seen in follow up and doing well from a cardiac standpoint.   On follow up today she is seen with her daughter. Does note some swelling in her legs. Finds it difficult to put compression hose on. No chest pain. Notes occasional palpitations that responds to prn metoprolol. Nothing sustained.   Past Medical History:  Diagnosis Date   Arthritis    right   Atrial fibrillation (HCC)    GERD (gastroesophageal reflux disease)    hx of   Hepatitis C    Humerus fracture    Hyperlipidemia    Hypertension    Irritable bowel syndrome    Meningioma (HCC)    PONV (postoperative nausea and vomiting)     Past Surgical History:  Procedure Laterality Date   BREAST SURGERY     lumpectomy   CARDIOVASCULAR STRESS TEST  11/14/2006   EF 67%   COLONOSCOPY W/ BIOPSIES AND POLYPECTOMY     ORIF HUMERUS FRACTURE Right 04/18/2014   Procedure: OPEN REDUCTION INTERNAL FIXATION (ORIF) RIGHT PROXIMAL HUMERUS FRACTURE;  Surgeon: Beverely Low, MD;  Location: [facility] OR;  Service: Orthopedics;  Laterality: Right;   ORIF PERIPROSTHETIC FRACTURE Left 04/30/2013  Procedure: OPEN REDUCTION INTERNAL FIXATION (ORIF) PERIPROSTHETIC FRACTURE;  Surgeon: Shelda Pal, MD;  Location: [facility] OR;  Service: Orthopedics;  Laterality: Left;   PAROTID GLAND TUMOR EXCISION     TOTAL HIP ARTHROPLASTY  11/07   left   TOTAL KNEE ARTHROPLASTY     TOTAL KNEE ARTHROPLASTY Left 11/24/2017   Procedure: TOTAL KNEE ARTHROPLASTY;  Surgeon: Ollen Gross, MD;  Location: [facility] ORS;  Service: Orthopedics;  Laterality: Left;    TRANSTHORACIC ECHOCARDIOGRAM  10/25/2009   EF 55-60%    Current Medications: Current Meds  Medication Sig   acetaminophen (TYLENOL) 325 MG tablet Take 2  tablets (650 mg total) by mouth every 6 (six) hours as needed for mild pain (or Fever >/= 101).   atorvastatin (LIPITOR) 20 MG tablet Take 1 tablet (20 mg total) by mouth at bedtime.   ergocalciferol (VITAMIN D2) 1.25 MG (50000 UT) capsule Take 50,000 Units by mouth every Friday.   gabapentin (NEURONTIN) 100 MG capsule Take 100 mg by mouth daily as needed (pain).   hydrALAZINE (APRESOLINE) 25 MG tablet TAKE 1 TABLET BY MOUTH THREE TIMES DAILY   losartan (COZAAR) 25 MG tablet Take 1 tablet by mouth once daily   Melatonin 10 MG CHEW Chew 10 mg by mouth at bedtime as needed.   metoprolol succinate (TOPROL-XL) 50 MG 24 hr tablet Take 1 tablet (50 mg total) by mouth daily. Take with or immediately following a meal.   metoprolol tartrate (LOPRESSOR) 25 MG tablet Take 0.5 tablets (12.5 mg total) by mouth 2 (two) times daily as needed (palpitations).   Multiple Vitamins-Minerals (PRESERVISION AREDS PO) Take 1 capsule by mouth 2 (two) times daily.   nitroGLYCERIN (NITROSTAT) 0.4 MG SL tablet Place 1 tablet (0.4 mg total) under the tongue every 5 (five) minutes as needed for chest pain.   omeprazole (PRILOSEC) 40 MG capsule Take 40 mg by mouth 2 (two) times daily.   Polyethyl Glycol-Propyl Glycol (SYSTANE) 0.4-0.3 % SOLN Place 1 drop into both eyes daily as needed.   polyethylene glycol (MIRALAX) 17 g packet Take 17 g by mouth daily.   PROCTO-MED HC 2.5 % rectal cream Apply 1 application topically 2 (two) times daily.   vitamin B-12 (CYANOCOBALAMIN) 1000 MCG tablet Take 1,000 mcg by mouth every evening.    warfarin (COUMADIN) 6 MG tablet Take 3-6 mg by mouth See admin instructions. Take 6 mg by mouth daily on Monday, Wednesday  Take 3 mg by mouth daily on all other days.   zolpidem (AMBIEN) 5 MG tablet Take 5 mg by mouth at bedtime as needed.     Allergies:   Amlodipine, Cephalexin, Ciprofloxacin, Codeine, Metoclopramide hcl, Nitrofuran derivatives, and Rosuvastatin   Social History   Socioeconomic  History   Marital status: Widowed    Spouse name: Not on file   Number of children: 2   Years of education: Not on file   Highest education level: Not on file  Occupational History    Employer: RETIRED  Tobacco Use   Smoking status: Former    Current packs/day: 0.00    Average packs/day: 0.3 packs/day for 15.0 years (4.5 ttl pk-yrs)    Types: Cigarettes    Start date: 06/07/1965    Quit date: 06/07/1980    Years since quitting: 42.3   Smokeless tobacco: Never  Vaping Use   Vaping status: Never Used  Substance and Sexual Activity   Alcohol use: No   Drug use: No   Sexual activity: Never  Other  Topics Concern   Not on file  Social History Narrative   Not on file   Social Determinants of Health   Financial Resource Strain: Not on file  Food Insecurity: Not on file  Transportation Needs: Not on file  Physical Activity: Not on file  Stress: Not on file  Social Connections: Not on file     Family History: The patient's family history includes Heart disease in her brother. There is no history of Breast cancer.  ROS:   Please see the history of present illness.     All other systems reviewed and are negative.  EKGs/Labs/Other Studies Reviewed:    The following studies were reviewed today:  Echo 06/17/2020  1. Left ventricular ejection fraction, by estimation, is 60 to 65%. The  left ventricle has normal function. The left ventricle has no regional  wall motion abnormalities. There is mild concentric left ventricular  hypertrophy. Left ventricular diastolic  parameters are consistent with Grade II diastolic dysfunction  (pseudonormalization). Elevated left ventricular end-diastolic pressure.   2. Right ventricular systolic function is normal. The right ventricular  size is normal. Tricuspid regurgitation signal is inadequate for assessing  PA pressure.   3. Left atrial size was severely dilated.   4. The mitral valve is normal in structure. Trivial mitral valve   regurgitation. No evidence of mitral stenosis.   5. The aortic valve is tricuspid. Aortic valve regurgitation is not  visualized. Mild to moderate aortic valve sclerosis/calcification is  present, without any evidence of aortic stenosis. Aortic valve area, by  VTI measures 1.82 cm. Aortic valve mean  gradient measures 9.0 mmHg. Aortic valve Vmax measures 2.08 m/s.   6. Aortic dilatation noted. There is mild dilatation of the aortic root,  measuring 38 mm.   7. The inferior vena cava is normal in size with greater than 50%  respiratory variability, suggesting right atrial pressure of 3 mmHg.   Event monitor 07/31/21: Study Highlights      Normal sinus rhythm   Occasional PACs.   Rare PVCs   SVT longest episode 28 minutes with rate 107 bpm.   No definite evidence of AFib   No significant pauses or AV block.    EKG:  EKG is not ordered today.    Recent Labs: 02/21/2022: ALT 18; B Natriuretic Peptide 759.1; Hemoglobin 12.2; Platelets 232 03/25/2022: BUN 22; Creatinine, Ser 0.78; Potassium 4.3; Sodium 140  Recent Lipid Panel    Component Value Date/Time   CHOL 178 07/14/2016 0536   TRIG 69 07/14/2016 0536   HDL 58 07/14/2016 0536   CHOLHDL 3.1 07/14/2016 0536   VLDL 14 07/14/2016 0536   LDLCALC 106 (H) 07/14/2016 0536   Dated 04/18/20: Apo B 73. Cholesterol 157. LDL 77. Triglycerides 78. Dated 07/25/22: cholesterol 127, triglycerides 93, HDL 54, LDL 54. CMET normal.   Risk Assessment/Calculations:    CHA2DS2-VASc Score =    This indicates a  % annual risk of stroke. The patient's score is based upon:            Physical Exam:    VS:  BP 134/80 (BP Location: Right Arm)   Pulse 87   Ht 5\' 3"  (1.6 m)   Wt 143 lb 3.2 oz (65 kg)   SpO2 95%   BMI 25.37 kg/m     Wt Readings from Last 3 Encounters:  10/11/22 143 lb 3.2 oz (65 kg)  03/25/22 139 lb 3.2 oz (63.1 kg)  02/21/22 142 lb 3.2 oz (64.5 kg)  GEN:  Well nourished, well developed in no acute distress HEENT:  Normal NECK: No JVD; No carotid bruits LYMPHATICS: No lymphadenopathy CARDIAC: RRR, no murmurs, rubs, gallops RESPIRATORY:  Clear to auscultation without rales, wheezing or rhonchi  ABDOMEN: Soft, non-tender, non-distended MUSCULOSKELETAL:  No edema; No deformity  SKIN: Warm and dry NEUROLOGIC:  Alert and oriented x 3 PSYCHIATRIC:  Normal affect   ASSESSMENT:    1. PAF (paroxysmal atrial fibrillation) (HCC)   2. Essential hypertension   3. Long term (current) use of anticoagulants   4. Hyperlipidemia, unspecified hyperlipidemia type       PLAN:    In order of problems listed above:  PAF: Continue Coumadin therapy for anticoagulation. Off Flecainide due to significant bradycardia in hospital. On no rate slowing medication now. She does have mild palpitations that respond to metoprolol. Not sustained.  Follow up in 6 months.  Hypertension: Blood pressure is well controlled today  Hyperlipidemia: Continue Lipitor- well controlled.   4.   LE edema. Mild. Encourage sodium restriction. Elevation. Support hose if she can manage. If swelling gets worse consider diuretic therapy        Medication Adjustments/Labs and Tests Ordered: Current medicines are reviewed at length with the patient today.  Concerns regarding medicines are outlined above.  No orders of the defined types were placed in this encounter.   No orders of the defined types were placed in this encounter.    Patient Instructions  Medication Instructions:  Continue same medications *If you need a refill on your cardiac medications before your next appointment, please call your pharmacy*   Lab Work: None ordered   Testing/Procedures: None ordered   Follow-Up: At [facility], you and your health needs are our priority.  As part of our continuing mission to provide you with exceptional heart care, we have created designated Provider Care Teams.  These Care Teams include your primary Cardiologist  (physician) and Advanced Practice Providers (APPs -  Physician Assistants and Nurse Practitioners) who all work together to provide you with the care you need, when you need it.  We recommend signing up for the patient portal called "MyChart".  Sign up information is provided on this After Visit Summary.  MyChart is used to connect with patients for Virtual Visits (Telemedicine).  Patients are able to view lab/test results, encounter notes, upcoming appointments, etc.  Non-urgent messages can be sent to your provider as well.   To learn more about what you can do with MyChart, go to [email or web address].    Your next appointment:  6 months   Call in Jan to schedule April appointment     Provider:  Dr.Tyray Proch     Signed, Jisela Merlino [place], MD  10/11/2022 10:50 AM    [facility]

## 2022-10-07 ENCOUNTER — Other Ambulatory Visit: Payer: Self-pay | Admitting: Cardiology

## 2022-10-11 ENCOUNTER — Encounter: Payer: Self-pay | Admitting: Cardiology

## 2022-10-11 ENCOUNTER — Ambulatory Visit: Payer: Medicare Other | Attending: Cardiology | Admitting: Cardiology

## 2022-10-11 VITALS — BP 134/80 | HR 87 | Ht 63.0 in | Wt 143.2 lb

## 2022-10-11 DIAGNOSIS — Z7901 Long term (current) use of anticoagulants: Secondary | ICD-10-CM

## 2022-10-11 DIAGNOSIS — E785 Hyperlipidemia, unspecified: Secondary | ICD-10-CM | POA: Diagnosis not present

## 2022-10-11 DIAGNOSIS — I480 Paroxysmal atrial fibrillation: Secondary | ICD-10-CM

## 2022-10-11 DIAGNOSIS — I10 Essential (primary) hypertension: Secondary | ICD-10-CM | POA: Diagnosis not present

## 2022-10-11 NOTE — Patient Instructions (Signed)
Medication Instructions:  Continue same medications *If you need a refill on your cardiac medications before your next appointment, please call your pharmacy*   Lab Work: None ordered   Testing/Procedures: None ordered   Follow-Up: At [facility], you and your health needs are our priority.  As part of our continuing mission to provide you with exceptional heart care, we have created designated Provider Care Teams.  These Care Teams include your primary Cardiologist (physician) and Advanced Practice Providers (APPs -  Physician Assistants and Nurse Practitioners) who all work together to provide you with the care you need, when you need it.  We recommend signing up for the patient portal called "MyChart".  Sign up information is provided on this After Visit Summary.  MyChart is used to connect with patients for Virtual Visits (Telemedicine).  Patients are able to view lab/test results, encounter notes, upcoming appointments, etc.  Non-urgent messages can be sent to your provider as well.   To learn more about what you can do with MyChart, go to [email or web address].    Your next appointment:  6 months    Call in Jan to schedule April appointment     Provider:  Dr.Jordan

## 2022-11-13 ENCOUNTER — Other Ambulatory Visit: Payer: Self-pay | Admitting: Cardiology

## 2022-11-14 ENCOUNTER — Ambulatory Visit
Admission: RE | Admit: 2022-11-14 | Discharge: 2022-11-14 | Disposition: A | Discharge status: Home / Self Care | Payer: Medicare Other | Source: Ambulatory Visit | Attending: Internal Medicine | Admitting: Internal Medicine

## 2022-11-14 DIAGNOSIS — Z1231 Encounter for screening mammogram for malignant neoplasm of breast: Secondary | ICD-10-CM

## 2022-12-23 ENCOUNTER — Other Ambulatory Visit: Payer: Self-pay | Admitting: Cardiology

## 2023-01-18 ENCOUNTER — Other Ambulatory Visit: Payer: Self-pay

## 2023-01-18 ENCOUNTER — Inpatient Hospital Stay (HOSPITAL_COMMUNITY)
Admission: EM | Admit: 2023-01-18 | Discharge: 2023-01-21 | DRG: 536 | Disposition: A | Discharge status: Skilled Nursing Facility | Payer: Medicare Other | Attending: Internal Medicine | Admitting: Internal Medicine

## 2023-01-18 ENCOUNTER — Emergency Department (HOSPITAL_COMMUNITY): Payer: Medicare Other

## 2023-01-18 ENCOUNTER — Encounter (HOSPITAL_COMMUNITY): Payer: Self-pay

## 2023-01-18 DIAGNOSIS — W19XXXA Unspecified fall, initial encounter: Secondary | ICD-10-CM | POA: Diagnosis not present

## 2023-01-18 DIAGNOSIS — I480 Paroxysmal atrial fibrillation: Secondary | ICD-10-CM | POA: Diagnosis present

## 2023-01-18 DIAGNOSIS — Z79899 Other long term (current) drug therapy: Secondary | ICD-10-CM | POA: Diagnosis not present

## 2023-01-18 DIAGNOSIS — Z881 Allergy status to other antibiotic agents status: Secondary | ICD-10-CM

## 2023-01-18 DIAGNOSIS — Z888 Allergy status to other drugs, medicaments and biological substances status: Secondary | ICD-10-CM | POA: Diagnosis not present

## 2023-01-18 DIAGNOSIS — W06XXXA Fall from bed, initial encounter: Secondary | ICD-10-CM | POA: Diagnosis present

## 2023-01-18 DIAGNOSIS — S32119A Unspecified Zone I fracture of sacrum, initial encounter for closed fracture: Secondary | ICD-10-CM | POA: Diagnosis present

## 2023-01-18 DIAGNOSIS — Z96643 Presence of artificial hip joint, bilateral: Secondary | ICD-10-CM | POA: Diagnosis present

## 2023-01-18 DIAGNOSIS — Z885 Allergy status to narcotic agent status: Secondary | ICD-10-CM

## 2023-01-18 DIAGNOSIS — Y92009 Unspecified place in unspecified non-institutional (private) residence as the place of occurrence of the external cause: Secondary | ICD-10-CM

## 2023-01-18 DIAGNOSIS — S32591A Other specified fracture of right pubis, initial encounter for closed fracture: Secondary | ICD-10-CM | POA: Diagnosis present

## 2023-01-18 DIAGNOSIS — Z87891 Personal history of nicotine dependence: Secondary | ICD-10-CM

## 2023-01-18 DIAGNOSIS — S3282XA Multiple fractures of pelvis without disruption of pelvic ring, initial encounter for closed fracture: Secondary | ICD-10-CM | POA: Diagnosis not present

## 2023-01-18 DIAGNOSIS — I10 Essential (primary) hypertension: Secondary | ICD-10-CM | POA: Diagnosis present

## 2023-01-18 DIAGNOSIS — D329 Benign neoplasm of meninges, unspecified: Secondary | ICD-10-CM | POA: Diagnosis present

## 2023-01-18 DIAGNOSIS — S32592A Other specified fracture of left pubis, initial encounter for closed fracture: Secondary | ICD-10-CM | POA: Diagnosis present

## 2023-01-18 DIAGNOSIS — E785 Hyperlipidemia, unspecified: Secondary | ICD-10-CM | POA: Diagnosis present

## 2023-01-18 DIAGNOSIS — Z66 Do not resuscitate: Secondary | ICD-10-CM | POA: Diagnosis present

## 2023-01-18 DIAGNOSIS — Z8619 Personal history of other infectious and parasitic diseases: Secondary | ICD-10-CM

## 2023-01-18 DIAGNOSIS — K219 Gastro-esophageal reflux disease without esophagitis: Secondary | ICD-10-CM | POA: Diagnosis present

## 2023-01-18 DIAGNOSIS — Z96652 Presence of left artificial knee joint: Secondary | ICD-10-CM | POA: Diagnosis present

## 2023-01-18 DIAGNOSIS — Z8249 Family history of ischemic heart disease and other diseases of the circulatory system: Secondary | ICD-10-CM

## 2023-01-18 DIAGNOSIS — Z7901 Long term (current) use of anticoagulants: Secondary | ICD-10-CM | POA: Diagnosis not present

## 2023-01-18 DIAGNOSIS — Z7189 Other specified counseling: Secondary | ICD-10-CM

## 2023-01-18 LAB — CBC WITH DIFFERENTIAL/PLATELET
Abs Immature Granulocytes: 0.13 10*3/uL — ABNORMAL HIGH (ref 0.00–0.07)
Basophils Absolute: 0 10*3/uL (ref 0.0–0.1)
Basophils Relative: 0 %
Eosinophils Absolute: 0.1 10*3/uL (ref 0.0–0.5)
Eosinophils Relative: 1 %
HCT: 42.4 % (ref 36.0–46.0)
Hemoglobin: 13.6 g/dL (ref 12.0–15.0)
Immature Granulocytes: 1 %
Lymphocytes Relative: 10 %
Lymphs Abs: 0.9 10*3/uL (ref 0.7–4.0)
MCH: 29.2 pg (ref 26.0–34.0)
MCHC: 32.1 g/dL (ref 30.0–36.0)
MCV: 91.2 fL (ref 80.0–100.0)
Monocytes Absolute: 0.6 10*3/uL (ref 0.1–1.0)
Monocytes Relative: 6 %
Neutro Abs: 7.8 10*3/uL — ABNORMAL HIGH (ref 1.7–7.7)
Neutrophils Relative %: 82 %
Platelets: 206 10*3/uL (ref 150–400)
RBC: 4.65 MIL/uL (ref 3.87–5.11)
RDW: 14.9 % (ref 11.5–15.5)
WBC: 9.6 10*3/uL (ref 4.0–10.5)
nRBC: 0 % (ref 0.0–0.2)

## 2023-01-18 LAB — BASIC METABOLIC PANEL
Anion gap: 11 (ref 5–15)
BUN: 18 mg/dL (ref 8–23)
CO2: 22 mmol/L (ref 22–32)
Calcium: 9.2 mg/dL (ref 8.9–10.3)
Chloride: 105 mmol/L (ref 98–111)
Creatinine, Ser: 0.72 mg/dL (ref 0.44–1.00)
GFR, Estimated: >60 mL/min (ref >60)
Glucose, Bld: 106 mg/dL — ABNORMAL HIGH (ref 70–99)
Potassium: 3.8 mmol/L (ref 3.5–5.1)
Sodium: 138 mmol/L (ref 135–145)

## 2023-01-18 LAB — HEPATIC FUNCTION PANEL
ALT: 33 U/L (ref 0–44)
AST: 33 U/L (ref 15–41)
Albumin: 3.6 g/dL (ref 3.5–5.0)
Alkaline Phosphatase: 132 U/L — ABNORMAL HIGH (ref 38–126)
Bilirubin, Direct: 0.2 mg/dL (ref 0.0–0.2)
Indirect Bilirubin: 1.2 mg/dL — ABNORMAL HIGH (ref 0.3–0.9)
Total Bilirubin: 1.4 mg/dL — ABNORMAL HIGH (ref 0.0–1.2)
Total Protein: 6.4 g/dL — ABNORMAL LOW (ref 6.5–8.1)

## 2023-01-18 LAB — PROTIME-INR
INR: 2.6 — ABNORMAL HIGH (ref 0.8–1.2)
Prothrombin Time: 28 s — ABNORMAL HIGH (ref 11.4–15.2)

## 2023-01-18 LAB — MAGNESIUM: Magnesium: 2 mg/dL (ref 1.7–2.4)

## 2023-01-18 MED ORDER — BISACODYL 5 MG PO TBEC: 5.0000 mg | DELAYED_RELEASE_TABLET | Freq: Every day | ORAL | Status: DC | PRN

## 2023-01-18 MED ORDER — MORPHINE SULFATE (PF) 4 MG/ML IV SOLN
4.0000 mg | Freq: Once | INTRAVENOUS | Status: AC
  Administered 2023-01-18: 4 mg via INTRAVENOUS
  Filled 2023-01-18: qty 1

## 2023-01-18 MED ORDER — HYDROCODONE-ACETAMINOPHEN 5-325 MG PO TABS
1.0000 | ORAL_TABLET | Freq: Four times a day (QID) | ORAL | Status: DC | PRN
  Administered 2023-01-18: 2 via ORAL
  Filled 2023-01-18: qty 2

## 2023-01-18 MED ORDER — ATORVASTATIN CALCIUM 10 MG PO TABS
20.0000 mg | ORAL_TABLET | Freq: Every day | ORAL | Status: DC
  Administered 2023-01-18 – 2023-01-20 (×3): 20 mg via ORAL
  Filled 2023-01-18 (×3): qty 2

## 2023-01-18 MED ORDER — DEXTROSE-SODIUM CHLORIDE 5-0.9 % IV SOLN: INTRAVENOUS | Status: DC

## 2023-01-18 MED ORDER — HYDROCHLOROTHIAZIDE 12.5 MG PO TABS: 6.2500 mg | ORAL_TABLET | Freq: Every day | ORAL | Status: DC

## 2023-01-18 MED ORDER — POLYETHYLENE GLYCOL 3350 17 G PO PACK: 17.0000 g | PACK | Freq: Every day | ORAL | Status: DC | PRN

## 2023-01-18 MED ORDER — METHOCARBAMOL 500 MG PO TABS
500.0000 mg | ORAL_TABLET | Freq: Three times a day (TID) | ORAL | Status: DC | PRN
  Administered 2023-01-18 – 2023-01-21 (×3): 500 mg via ORAL
  Filled 2023-01-18 (×3): qty 1

## 2023-01-18 MED ORDER — METOPROLOL TARTRATE 25 MG PO TABS
25.0000 mg | ORAL_TABLET | Freq: Two times a day (BID) | ORAL | Status: DC
  Administered 2023-01-18 (×2): 25 mg via ORAL
  Filled 2023-01-18 (×2): qty 1

## 2023-01-18 MED ORDER — IOHEXOL 350 MG/ML SOLN
75.0000 mL | Freq: Once | INTRAVENOUS | Status: AC | PRN
  Administered 2023-01-18: 75 mL via INTRAVENOUS

## 2023-01-18 MED ORDER — METHOCARBAMOL 500 MG PO TABS: 500.0000 mg | ORAL_TABLET | Freq: Four times a day (QID) | ORAL | Status: DC | PRN

## 2023-01-18 MED ORDER — ONDANSETRON HCL 4 MG/2ML IJ SOLN
4.0000 mg | Freq: Once | INTRAMUSCULAR | Status: AC
  Administered 2023-01-18: 4 mg via INTRAVENOUS
  Filled 2023-01-18: qty 2

## 2023-01-18 MED ORDER — METHOCARBAMOL 1000 MG/10ML IJ SOLN: 500.0000 mg | Freq: Four times a day (QID) | INTRAMUSCULAR | Status: DC | PRN

## 2023-01-18 MED ORDER — HYDROMORPHONE HCL 1 MG/ML IJ SOLN
1.0000 mg | Freq: Four times a day (QID) | INTRAMUSCULAR | Status: DC | PRN
Start: 2023-01-18 — End: 2023-01-20
  Administered 2023-01-20: 1 mg via INTRAVENOUS
  Filled 2023-01-18: qty 1

## 2023-01-18 MED ORDER — WARFARIN SODIUM 3 MG PO TABS
3.0000 mg | ORAL_TABLET | Freq: Once | ORAL | Status: AC
  Administered 2023-01-18: 3 mg via ORAL
  Filled 2023-01-18: qty 1

## 2023-01-18 MED ORDER — METHOCARBAMOL 1000 MG/10ML IJ SOLN: 500.0000 mg | Freq: Two times a day (BID) | INTRAMUSCULAR | Status: DC | PRN

## 2023-01-18 MED ORDER — DOCUSATE SODIUM 100 MG PO CAPS
100.0000 mg | ORAL_CAPSULE | Freq: Two times a day (BID) | ORAL | Status: DC
  Administered 2023-01-18 (×2): 100 mg via ORAL
  Filled 2023-01-18 (×2): qty 1

## 2023-01-18 MED ORDER — WARFARIN - PHARMACIST DOSING INPATIENT: Freq: Every day | Status: DC

## 2023-01-18 NOTE — Progress Notes (Signed)
 PHARMACY - ANTICOAGULATION CONSULT NOTE  Pharmacy Consult for warfarin Indication: atrial fibrillation  Allergies  Allergen Reactions   Amlodipine  Swelling   Cephalexin Other (See Comments)    unknown   Ciprofloxacin  Nausea And Vomiting   Codeine Other (See Comments)    unknown   Metoclopramide  Hcl Other (See Comments)    unknown   Nitrofuran Derivatives Other (See Comments)    unknown   Rosuvastatin     Other reaction(s): Myalgias    Patient Measurements: Height: 5' 3 (160 cm) Weight: 65 kg (143 lb 4.8 oz) IBW/kg (Calculated) : 52.4  Vital Signs: Temp: 98.2 F (36.8 C) (01/11 0858) Temp Source: Oral (01/11 0858) BP: 152/90 (01/11 1130) Pulse Rate: 100 (01/11 1130)  Labs: Recent Labs    01/18/23 0914  HGB 13.6  HCT 42.4  PLT 206  LABPROT 28.0*  INR 2.6*  CREATININE 0.72    Estimated Creatinine Clearance: 42.4 mL/min (by C-G formula based on SCr of 0.72 mg/dL).   Medical History: Past Medical History:  Diagnosis Date   Arthritis    right   Atrial fibrillation (HCC)    GERD (gastroesophageal reflux disease)    hx of   Hepatitis C    Humerus fracture    Hyperlipidemia    Hypertension    Irritable bowel syndrome    Meningioma (HCC)    PONV (postoperative nausea and vomiting)      Assessment: 89 YOF presenting with fall, hx of afib on warfarin PTA, INR on admission is therapeutic at 2.6, last dose taken on 1/10  PTA dosing: 3mg  daily, except 6mg  on Monday and Wed  Goal of Therapy:  INR 2-3 Monitor platelets by anticoagulation protocol: Yes   Plan:  Warfarin 3mg  po x 1 today Daily INR  Dorn Poot, PharmD, [facility] Clinical Pharmacist ED Pharmacist Phone # [phone] 01/18/2023 1:29 PM

## 2023-01-18 NOTE — ED Provider Notes (Signed)
 [place] [facility] Provider Note   CSN: [id number] Arrival date & time: 01/18/23  0849     History  Chief Complaint  Patient presents with   [place]    Gloria [name] is a 89 y.o. female.  89 yo F with a chief complaints of a fall.  This happened just prior to arrival.  The patient was up and walking and she lost her balance and landed onto her left hip.  She has had that replaced and also revised.  She was unable to bear weight afterwards.  She denies head injury denies loss of consciousness denies neck pain back pain chest pain abdominal pain.   Fall       Home Medications Prior to Admission medications   Medication Sig Start Date End Date Taking? Authorizing Provider  acetaminophen  (TYLENOL ) 325 MG tablet Take 2 tablets (650 mg total) by mouth every 6 (six) hours as needed for mild pain (or Fever >/= 101). 06/18/20  Yes Sebastian Toribio GAILS, MD  atorvastatin  (LIPITOR) 20 MG tablet Take 1 tablet (20 mg total) by mouth at bedtime. 09/26/21  Yes Miriam Norris, NP  carboxymethylcellulose (REFRESH PLUS) 0.5 % SOLN Place 1 drop into both eyes once as needed.   Yes [provider]  ergocalciferol  (VITAMIN D2) 1.25 MG (50000 UT) capsule Take 50,000 Units by mouth every Friday.   Yes [provider]  gabapentin  (NEURONTIN ) 100 MG capsule Take 100 mg by mouth daily as needed (pain). 02/06/20  Yes [provider]  hydrALAZINE  (APRESOLINE ) 25 MG tablet TAKE 1 TABLET BY MOUTH THREE TIMES DAILY 10/09/22  Yes Jordan, Peter M, MD  losartan  (COZAAR ) 25 MG tablet Take 1 tablet by mouth once daily 11/13/22  Yes Jordan, Peter M, MD  Melatonin 10 MG CHEW Chew 10 mg by mouth at bedtime as needed.   Yes [provider]  metoprolol  succinate (TOPROL -XL) 50 MG 24 hr tablet Take 1 tablet (50 mg total) by mouth daily. Take with or immediately following a meal. Patient taking differently: Take 25 mg by mouth daily. Take with or  immediately following a meal. 08/19/22  Yes Carlin Delon BROCKS, NP  Multiple Vitamins-Minerals (PRESERVISION AREDS PO) Take 1 capsule by mouth 2 (two) times daily.   Yes [provider]  omeprazole  (PRILOSEC ) 40 MG capsule Take 40 mg by mouth 2 (two) times daily.   Yes [provider]  polyethylene glycol (MIRALAX ) 17 g packet Take 17 g by mouth daily.   Yes [provider]  PROCTO-MED HC 2.5 % rectal cream Apply 1 application topically 2 (two) times daily. 03/13/20  Yes [provider]  vitamin B-12 (CYANOCOBALAMIN ) 1000 MCG tablet Take 1,000 mcg by mouth every evening.    Yes [provider]  warfarin (COUMADIN ) 6 MG tablet Take 3-6 mg by mouth See admin instructions. Take 6 mg by mouth daily on Monday, Wednesday  Take 3 mg by mouth daily on all other days. 08/16/14  Yes [provider]  nitroGLYCERIN  (NITROSTAT ) 0.4 MG SL tablet DISSOLVE ONE TABLET UNDER THE TONGUE EVERY 5 MINUTES AS NEEDED FOR CHEST PAIN.  DO NOT EXCEED A TOTAL OF 3 DOSES IN 15 MINUTES Patient taking differently: Place 0.4 mg under the tongue every 5 (five) minutes as needed for chest pain. 12/23/22   Jordan, Peter M, MD      Allergies    Amlodipine , Cephalexin, Ciprofloxacin , Codeine, Metoclopramide  hcl, Nitrofuran derivatives, and Rosuvastatin    Review of  Systems   Review of Systems  Physical Exam Updated Vital Signs BP (!) 143/103   Pulse (!) 106   Temp 98 F (36.7 C)   Resp (!) 25   Ht 5' 3 (1.6 m)   Wt 65 kg   SpO2 99%   BMI 25.38 kg/m  Physical Exam Vitals and nursing note reviewed.  Constitutional:      General: She is not in acute distress.    Appearance: She is well-developed. She is not diaphoretic.  HENT:     Head: Normocephalic and atraumatic.  Eyes:     Pupils: Pupils are equal, round, and reactive to light.  Cardiovascular:     Rate and Rhythm: Normal rate and regular rhythm.     Heart sounds: No murmur heard.    No friction rub. No gallop.   Pulmonary:     Effort: Pulmonary effort is normal.     Breath sounds: No wheezing or rales.  Abdominal:     General: There is no distension.     Palpations: Abdomen is soft.     Tenderness: There is no abdominal tenderness.  Musculoskeletal:        General: Tenderness present.     Cervical back: Normal range of motion and neck supple.     Comments: Slight shortening of the left lower extremity.  Pulse motor and sensation are intact.  She has some pain with internal and external rotation of the left lower extremity.  Pain with compression of the femur.  No obvious pain with compression of the pelvis.  No obvious midline L-spine tenderness.  Patient was palpated from head to toe without any other obvious noted areas of bony tenderness.  Skin:    General: Skin is warm and dry.  Neurological:     Mental Status: She is alert and oriented to person, place, and time.  Psychiatric:        Behavior: Behavior normal.     ED Results / Procedures / Treatments   Labs (all labs ordered are listed, but only abnormal results are displayed) Labs Reviewed  CBC WITH DIFFERENTIAL/PLATELET - Abnormal; Notable for the following components:      Result Value   Neutro Abs 7.8 (*)    Abs Immature Granulocytes 0.13 (*)    All other components within normal limits  BASIC METABOLIC PANEL - Abnormal; Notable for the following components:   Glucose, Bld 106 (*)    All other components within normal limits  PROTIME-INR - Abnormal; Notable for the following components:   Prothrombin Time 28.0 (*)    INR 2.6 (*)    All other components within normal limits  HEPATIC FUNCTION PANEL - Abnormal; Notable for the following components:   Total Protein 6.4 (*)    Alkaline Phosphatase 132 (*)    Total Bilirubin 1.4 (*)    Indirect Bilirubin 1.2 (*)    All other components within normal limits  URINALYSIS, ROUTINE W REFLEX MICROSCOPIC  MAGNESIUM     EKG None  Radiology CT PELVIS W CONTRAST Result Date:  01/18/2023 CLINICAL DATA:  89 year old female status post fall with suspected acute pubic rami fractures. Previous bilateral hip replacement. EXAM: CT PELVIS WITH CONTRAST TECHNIQUE: Multidetector CT imaging of the pelvis was performed using the standard protocol following the bolus administration of intravenous contrast. RADIATION DOSE REDUCTION: This exam was performed according to the departmental dose-optimization program which includes automated exposure control, adjustment of the mA and/or kV according to patient size and/or use of iterative  reconstruction technique. CONTRAST:  75mL OMNIPAQUE  IOHEXOL  350 MG/ML SOLN COMPARISON:  Radiographs this morning. CT Abdomen and Pelvis 04/08/2011. FINDINGS: Urinary Tract: Kidneys are not significantly changed since 2013, occasional simple fluid density benign renal cysts (no follow-up imaging recommended). No hydronephrosis. No hydroureter. Urinary bladder largely obscured from hip arthroplasty artifact. Bowel: Nondilated visible stomach, large and small bowel. No pneumoperitoneum or mesenteric inflammation identified. Vascular/Lymphatic: Extensive Aortoiliac calcified atherosclerosis. Visible major arterial structures are patent. No pelvic lymphadenopathy. Reproductive:  Negative. Other: Small volume of free fluid in the right hemipelvis appears to be simple fluid density on series 3, image 29. Musculoskeletal: Chronic bilateral hip arthroplasty which was present in 2013. Proximal left femoral cerclage wires appear to be new since that time. No implant dislocation. Osteopenia. No periprosthetic proximal femur fracture is identified. Lower lumbar spine appears chronically degenerated but intact. Sacrum appears stable since 2013. Chronic left sacral ala fracture. SI joints appear intact. Iliac wings appear stable.  Pubic symphysis appears intact. Acute linear minimally displaced fracture of the left superior pubic ramus near the junction with the acetabulum on series 5,  image 48. Acute comminuted and mildly displaced inferior pubic ramus fracture on image 35. Contralateral chronic appearing pubic rami fractures which are new since 2013 (series 3, image 46). No superficial soft tissue injury identified. IMPRESSION: 1. Acute fractures of the left superior and inferior pubic rami. 2. Chronic appearing contralateral pubic rami fractures which are new since 2013. Chronic left sacral ala fracture. Chronic bilateral hip arthroplasty with adverse features or periprosthetic fracture identified. 3. Small volume of free fluid in the right hemipelvis is abnormal but nonspecific, perhaps reactive secondary to #1. 4.  Aortic Atherosclerosis (ICD10-I70.0). Electronically Signed   By: VEAR Hurst M.D.   On: 01/18/2023 11:12   DG Femur Min 2 Views Left Result Date: 01/18/2023 CLINICAL DATA:  Fall.  Left hip pain after a fall. EXAM: LEFT FEMUR 2 VIEWS COMPARISON:  None Available. FINDINGS: Status post left hip replacement with cerclage wires overlying the proximal femur. No evidence for periprosthetic fracture. Patient noted to be status post tricompartmental knee replacement without complicating features on this nondedicated study. As on the pelvis x-ray, fractures are identified in the left superior and inferior pubic rami, likely acute. IMPRESSION: 1. Status post left hip replacement without evidence for periprosthetic fracture. 2. Left superior and inferior pubic rami fractures, likely acute. Electronically Signed   By: Camellia Candle M.D.   On: 01/18/2023 10:04   DG Pelvis 1-2 Views Result Date: 01/18/2023 CLINICAL DATA:  Hip pain. EXAM: PELVIS - 1-2 VIEW COMPARISON:  None Available. FINDINGS: Bones are diffusely demineralized. Patient is status post bilateral hip replacement. Femoral component of each replacement has not been completely imaged on this study. Age indeterminate but probably acute fractures are seen in the left superior and inferior pubic rami. Fracture in the right inferior  pubic ramus is age indeterminate. IMPRESSION: 1. Age indeterminate but probably acute fractures in the left superior and inferior pubic rami. 2. Age indeterminate fracture in the right inferior pubic ramus. 3. Status post bilateral hip replacement. Femoral component of each replacement has not been completely imaged on this study. Electronically Signed   By: Camellia Candle M.D.   On: 01/18/2023 10:03    Procedures Procedures    Medications Ordered in ED Medications  atorvastatin  (LIPITOR) tablet 20 mg (has no administration in time range)  metoprolol  tartrate (LOPRESSOR ) tablet 25 mg (25 mg Oral Given 01/18/23 1356)  hydrochlorothiazide  (HYDRODIURIL ) tablet 6.25 mg (  6.25 mg Oral Not Given 01/18/23 1348)  HYDROcodone -acetaminophen  (NORCO/VICODIN) 5-325 MG per tablet 1-2 tablet (has no administration in time range)  docusate sodium  (COLACE) capsule 100 mg (100 mg Oral Given 01/18/23 1356)  polyethylene glycol (MIRALAX  / GLYCOLAX ) packet 17 g (has no administration in time range)  bisacodyl  (DULCOLAX) EC tablet 5 mg (has no administration in time range)  dextrose  5 %-0.9 % sodium chloride  infusion ( Intravenous New Bag/Given 01/18/23 1355)  methocarbamol  (ROBAXIN ) tablet 500 mg (has no administration in time range)    Or  methocarbamol  (ROBAXIN ) injection 500 mg (has no administration in time range)  warfarin (COUMADIN ) tablet 3 mg (has no administration in time range)  Warfarin - Pharmacist Dosing Inpatient (has no administration in time range)  HYDROmorphone  (DILAUDID ) injection 1 mg (has no administration in time range)  morphine  (PF) 4 MG/ML injection 4 mg (4 mg Intravenous Given 01/18/23 0918)  ondansetron  (ZOFRAN ) injection 4 mg (4 mg Intravenous Given 01/18/23 0918)  iohexol  (OMNIPAQUE ) 350 MG/ML injection 75 mL (75 mLs Intravenous Contrast Given 01/18/23 1101)  morphine  (PF) 4 MG/ML injection 4 mg (4 mg Intravenous Given 01/18/23 1146)    ED Course/ Medical Decision Making/ A&P                                  Medical Decision Making Amount and/or Complexity of Data Reviewed Labs: ordered. Radiology: ordered.  Risk Prescription drug management. Decision regarding hospitalization.   89 yo F with a chief complaint of left hip pain.  The patient tells me that she has had a prior hip replacement on the left and had it revised because she had a periprosthetic fracture.  She had what sounds like a nonsyncopal fall.  She denies any other areas of pain.  Unable to bear weight.  Will start with plain films.  Basic blood work INR as she is on Coumadin .  Reassess.  Patient's INR is 2.6.  No acute anemia no significant electrolyte abnormalities.  Plain film of the pelvis independently interpreted by me with a left superior and inferior pubic rami fracture.  Patient is still quite uncomfortable on exam.  Will obtain CT imaging of the pelvis to assess for acute bleeding.  Will discuss with Ortho.  Discussed with Dr. Barton, ortho.   CT scan consistent with fractures.  Will discuss with medicine for admission.  The patients results and plan were reviewed and discussed.   Any x-rays performed were independently reviewed by myself.   Differential diagnosis were considered with the presenting HPI.  Medications  atorvastatin  (LIPITOR) tablet 20 mg (has no administration in time range)  metoprolol  tartrate (LOPRESSOR ) tablet 25 mg (25 mg Oral Given 01/18/23 1356)  hydrochlorothiazide  (HYDRODIURIL ) tablet 6.25 mg (6.25 mg Oral Not Given 01/18/23 1348)  HYDROcodone -acetaminophen  (NORCO/VICODIN) 5-325 MG per tablet 1-2 tablet (has no administration in time range)  docusate sodium  (COLACE) capsule 100 mg (100 mg Oral Given 01/18/23 1356)  polyethylene glycol (MIRALAX  / GLYCOLAX ) packet 17 g (has no administration in time range)  bisacodyl  (DULCOLAX) EC tablet 5 mg (has no administration in time range)  dextrose  5 %-0.9 % sodium chloride  infusion ( Intravenous New Bag/Given 01/18/23 1355)   methocarbamol  (ROBAXIN ) tablet 500 mg (has no administration in time range)    Or  methocarbamol  (ROBAXIN ) injection 500 mg (has no administration in time range)  warfarin (COUMADIN ) tablet 3 mg (has no administration in time range)  Warfarin -  Pharmacist Dosing Inpatient (has no administration in time range)  HYDROmorphone  (DILAUDID ) injection 1 mg (has no administration in time range)  morphine  (PF) 4 MG/ML injection 4 mg (4 mg Intravenous Given 01/18/23 0918)  ondansetron  (ZOFRAN ) injection 4 mg (4 mg Intravenous Given 01/18/23 0918)  iohexol  (OMNIPAQUE ) 350 MG/ML injection 75 mL (75 mLs Intravenous Contrast Given 01/18/23 1101)  morphine  (PF) 4 MG/ML injection 4 mg (4 mg Intravenous Given 01/18/23 1146)    Vitals:   01/18/23 1258 01/18/23 1300 01/18/23 1330 01/18/23 1400  BP:  (!) 142/84 (!) 141/87 (!) 143/103  Pulse:  88 88 (!) 106  Resp:  (!) 22 16 (!) 25  Temp: 98.1 F (36.7 C)   98 F (36.7 C)  TempSrc:      SpO2:  99% 100% 99%  Weight:      Height:        Final diagnoses:  Multiple closed fractures of pelvis without disruption of pelvic ring, initial encounter [facility])    Admission/ observation were discussed with the admitting physician, patient and/or family and they are comfortable with the plan.          Final Clinical Impression(s) / ED Diagnoses Final diagnoses:  Multiple closed fractures of pelvis without disruption of pelvic ring, initial encounter [facility])    Rx / DC Orders ED Discharge Orders     None         Emil Share, DO 01/18/23 1425

## 2023-01-18 NOTE — Hospital Course (Signed)
 S/p left THA 2007 and TKA 2019 by Dr. Lequita Halt and left hip periprosthetic ORIF 2015 by Dr. Charlann Boxer.    ortho

## 2023-01-18 NOTE — ED Notes (Signed)
 Patient placed onto hospital bed for comfort. Purewick placed due to multiple pelvic fractures.

## 2023-01-18 NOTE — H&P (Addendum)
 History and Physical    Patient: Gloria [name] FMW:[id number] DOB: 1930-11-21 DOA: 01/18/2023 DOS: the patient was seen and examined on 01/18/2023 PCP: Yolande Toribio MATSU, MD  Cardiology: [facility] -Dr.Jordan Peter  Patient coming from: Home Chief complaint: Chief Complaint  Patient presents with   Fall   HPI:  Gloria [name] is a 89 y.o. female with past medical history of tobacco abuse, essential hypertension  hypertension, hyperlipidemia,  allergies to Amlodipine , Cephalexin, Ciprofloxacin , Codeine, Metoclopramide  hcl, Nitrofuran derivatives, and Rosuvastatin  GERD and PAF on coumadin , coming for fall at home.  Patient was seen by cardiology in October 2024 documenting echo with moderate LVH and normal systolic function, with plans for continuation of Coumadin  for her PAF, monitoring her bradycardia and flecainide  was discontinued.Pt was getting out of bed and fell on her left side.  No reports of  striking her head or loss of consciousness or dizziness.  No reports of speech or gait issues otherwise.  Chart review does show that patient has been intermittently dizzy in the past and has had cardiology evaluation for the same in October 2024.  Patient had a negative MRI, CT angio head and neck which was also negative.  Per cardiology note She wore a long-term monitor in July 2023 which showed predominantly NSR, occasional PAC, rare PVC, PVC burden 1%.  Also noted patient's had multiple orthopedic procedures with knee arthroplasty, ORIF of the humerus.  Per ED provider Case discussed with orthopedic Dr. Barton on-call who recommended pain control and physical therapy along with standing xray when able to bear weight and followup at outpatient .  ED Course: In emergency room vitals trend shows elevated blood pressures patient's most recent oxygen  at 91% on room air. Vitals:   01/18/23 0900 01/18/23 1130 01/18/23 1258 01/18/23 1300  BP: (!) 179/94 (!) 152/90  (!) 142/84  Pulse: 81 100  88   Temp:   98.1 F (36.7 C)   Resp:  18  (!) 22  Height:      Weight:      SpO2: 96% 91%  99%  TempSrc:      BMI (Calculated):      EKG ordered and pending. Evaluation so far shows: Metabolic panel within normal limits with a glucose of 106. CBC with differential within normal limits. INR of 2.6. CT of the pelvis shows Acute fractures of the left superior and inferior pubic rami.  In addition to chronic appearing pubic rami fractures since 2013 that are new chronic bilateral hip arthroplasty noted as well.  Please refer to complete report.   In the ED pt received: Medications  morphine  (PF) 4 MG/ML injection 4 mg (has no administration in time range)  morphine  (PF) 4 MG/ML injection 4 mg (4 mg Intravenous Given 01/18/23 0918)  ondansetron  (ZOFRAN ) injection 4 mg (4 mg Intravenous Given 01/18/23 0918)  iohexol  (OMNIPAQUE ) 350 MG/ML injection 75 mL (75 mLs Intravenous Contrast Given 01/18/23 1101)    Review of Systems  Constitutional:  Negative for malaise/fatigue.  Eyes:  Negative for blurred vision.  Respiratory:  Negative for cough and shortness of breath.   Cardiovascular:  Negative for chest pain and palpitations.  Gastrointestinal:  Negative for abdominal pain, nausea and vomiting.  Musculoskeletal:  Positive for falls and joint pain.  Neurological:  Negative for dizziness, speech change, loss of consciousness, weakness and headaches.  All other systems reviewed and are negative.  Past Medical History:  Diagnosis Date   Arthritis    right  Atrial fibrillation (HCC)    GERD (gastroesophageal reflux disease)    hx of   Hepatitis C    Humerus fracture    Hyperlipidemia    Hypertension    Irritable bowel syndrome    Meningioma (HCC)    PONV (postoperative nausea and vomiting)    Past Surgical History:  Procedure Laterality Date   BREAST SURGERY     lumpectomy   CARDIOVASCULAR STRESS TEST  11/14/2006   EF 67%   COLONOSCOPY W/ BIOPSIES AND POLYPECTOMY     ORIF  HUMERUS FRACTURE Right 04/18/2014   Procedure: OPEN REDUCTION INTERNAL FIXATION (ORIF) RIGHT PROXIMAL HUMERUS FRACTURE;  Surgeon: Marcey Her, MD;  Location: [facility] OR;  Service: Orthopedics;  Laterality: Right;   ORIF PERIPROSTHETIC FRACTURE Left 04/30/2013   Procedure: OPEN REDUCTION INTERNAL FIXATION (ORIF) PERIPROSTHETIC FRACTURE;  Surgeon: Donnice JONETTA Car, MD;  Location[facility];  Service: Orthopedics;  Laterality: Left;   PAROTID GLAND TUMOR EXCISION     TOTAL HIP ARTHROPLASTY  11/07   left   TOTAL KNEE ARTHROPLASTY     TOTAL KNEE ARTHROPLASTY Left 11/24/2017   Procedure: TOTAL KNEE ARTHROPLASTY;  Surgeon: Melodi Lerner, MD;  Location: [facility] ORS;  Service: Orthopedics;  Laterality: Left;    TRANSTHORACIC ECHOCARDIOGRAM  10/25/2009   EF 55-60%    reports that she quit smoking about 42 years ago. Her smoking use included cigarettes. She started smoking about 57 years ago. She has a 4.5 pack-year smoking history. She has never used smokeless tobacco. She reports that she does not drink alcohol  and does not use drugs.  Allergies  Allergen Reactions   Amlodipine  Swelling   Cephalexin Other (See Comments)    unknown   Ciprofloxacin  Nausea And Vomiting   Codeine Other (See Comments)    unknown   Metoclopramide  Hcl Other (See Comments)    unknown   Nitrofuran Derivatives Other (See Comments)    unknown   Rosuvastatin     Other reaction(s): Myalgias    Family History  Problem Relation Age of Onset   Heart disease Brother    Breast cancer Neg Hx     Prior to Admission medications   Medication Sig Start Date End Date Taking? Authorizing Provider  acetaminophen  (TYLENOL ) 325 MG tablet Take 2 tablets (650 mg total) by mouth every 6 (six) hours as needed for mild pain (or Fever >/= 101). 06/18/20   Sebastian Toribio GAILS, MD  atorvastatin  (LIPITOR) 20 MG tablet Take 1 tablet (20 mg total) by mouth at bedtime. 09/26/21   Miriam Norris, NP  ergocalciferol  (VITAMIN D2) 1.25 MG (50000 UT)  capsule Take 50,000 Units by mouth every Friday.    [provider]  gabapentin  (NEURONTIN ) 100 MG capsule Take 100 mg by mouth daily as needed (pain). 02/06/20   [provider]  hydrALAZINE  (APRESOLINE ) 25 MG tablet TAKE 1 TABLET BY MOUTH THREE TIMES DAILY 10/09/22   Jordan, Peter M, MD  losartan  (COZAAR ) 25 MG tablet Take 1 tablet by mouth once daily 11/13/22   Jordan, Peter M, MD  Melatonin 10 MG CHEW Chew 10 mg by mouth at bedtime as needed.    [provider]  metoprolol  succinate (TOPROL -XL) 50 MG 24 hr tablet Take 1 tablet (50 mg total) by mouth daily. Take with or immediately following a meal. 08/19/22   Carlin Delon BROCKS, NP  metoprolol  tartrate (LOPRESSOR ) 25 MG tablet Take 0.5 tablets (12.5 mg total) by mouth 2 (two) times daily as needed (palpitations). 09/26/21  Miriam Norris, NP  Multiple Vitamins-Minerals (PRESERVISION AREDS PO) Take 1 capsule by mouth 2 (two) times daily.    [provider]  nitroGLYCERIN  (NITROSTAT ) 0.4 MG SL tablet DISSOLVE ONE TABLET UNDER THE TONGUE EVERY 5 MINUTES AS NEEDED FOR CHEST PAIN.  DO NOT EXCEED A TOTAL OF 3 DOSES IN 15 MINUTES 12/23/22   Jordan, Peter M, MD  omeprazole  (PRILOSEC ) 40 MG capsule Take 40 mg by mouth 2 (two) times daily.    [provider]  Polyethyl Glycol-Propyl Glycol (SYSTANE) 0.4-0.3 % SOLN Place 1 drop into both eyes daily as needed.    [provider]  polyethylene glycol (MIRALAX ) 17 g packet Take 17 g by mouth daily.    [provider]  PROCTO-MED HC 2.5 % rectal cream Apply 1 application topically 2 (two) times daily. 03/13/20   [provider]  vitamin B-12 (CYANOCOBALAMIN ) 1000 MCG tablet Take 1,000 mcg by mouth every evening.     [provider]  warfarin (COUMADIN ) 6 MG tablet Take 3-6 mg by mouth See admin instructions. Take 6 mg by mouth daily on Monday, Wednesday  Take 3 mg by mouth daily on all other days. 08/16/14   [provider]   zolpidem  (AMBIEN ) 5 MG tablet Take 5 mg by mouth at bedtime as needed. 12/08/19   [provider]     Vitals:   01/18/23 0900 01/18/23 1130 01/18/23 1258 01/18/23 1300  BP: (!) 179/94 (!) 152/90  (!) 142/84  Pulse: 81 100  88  Resp:  18  (!) 22  Temp:   98.1 F (36.7 C)   TempSrc:      SpO2: 96% 91%  99%  Weight:      Height:       Physical Exam Vitals and nursing note reviewed.  Constitutional:      General: She is not in acute distress. HENT:     Head: Normocephalic and atraumatic.     Right Ear: Hearing normal.     Left Ear: Hearing normal.     Nose: Nose normal. No nasal deformity.     Mouth/Throat:     Lips: Pink.     Tongue: No lesions.     Pharynx: Oropharynx is clear.  Eyes:     General: Lids are normal.     Extraocular Movements: Extraocular movements intact.  Cardiovascular:     Rate and Rhythm: Normal rate. Rhythm irregular.     Heart sounds: Normal heart sounds.  Pulmonary:     Effort: Pulmonary effort is normal.     Breath sounds: Normal breath sounds.  Abdominal:     General: Bowel sounds are normal. There is no distension.     Palpations: Abdomen is soft. There is no mass.     Tenderness: There is no abdominal tenderness.  Musculoskeletal:        General: Tenderness present. No swelling or deformity.     Left hip: Tenderness present. No deformity.     Right lower leg: No edema.     Left lower leg: No edema.       Legs:  Skin:    General: Skin is warm.  Neurological:     General: No focal deficit present.     Mental Status: She is alert and oriented to person, place, and time.     Cranial Nerves: Cranial nerves 2-12 are intact.  Psychiatric:        Attention and Perception: Attention normal.  Mood and Affect: Mood normal.        Speech: Speech normal.        Behavior: Behavior normal. Behavior is cooperative.     Labs on Admission: I have personally reviewed following labs and imaging studies. Results for orders placed or  performed during the hospital encounter of 01/18/23 (from the past 24 hours)  CBC with Differential     Status: Abnormal   Collection Time: 01/18/23  9:14 AM  Result Value Ref Range   WBC 9.6 4.0 - 10.5 K/uL   RBC 4.65 3.87 - 5.11 MIL/uL   Hemoglobin 13.6 12.0 - 15.0 g/dL   HCT 57.5 63.9 - 53.9 %   MCV 91.2 80.0 - 100.0 fL   MCH 29.2 26.0 - 34.0 pg   MCHC 32.1 30.0 - 36.0 g/dL   RDW 85.0 88.4 - 84.4 %   Platelets 206 150 - 400 K/uL   nRBC 0.0 0.0 - 0.2 %   Neutrophils Relative % 82 %   Neutro Abs 7.8 (H) 1.7 - 7.7 K/uL   Lymphocytes Relative 10 %   Lymphs Abs 0.9 0.7 - 4.0 K/uL   Monocytes Relative 6 %   Monocytes Absolute 0.6 0.1 - 1.0 K/uL   Eosinophils Relative 1 %   Eosinophils Absolute 0.1 0.0 - 0.5 K/uL   Basophils Relative 0 %   Basophils Absolute 0.0 0.0 - 0.1 K/uL   Immature Granulocytes 1 %   Abs Immature Granulocytes 0.13 (H) 0.00 - 0.07 K/uL  Basic metabolic panel     Status: Abnormal   Collection Time: 01/18/23  9:14 AM  Result Value Ref Range   Sodium 138 135 - 145 mmol/L   Potassium 3.8 3.5 - 5.1 mmol/L   Chloride 105 98 - 111 mmol/L   CO2 22 22 - 32 mmol/L   Glucose, Bld 106 (H) 70 - 99 mg/dL   BUN 18 8 - 23 mg/dL   Creatinine, Ser 9.27 0.44 - 1.00 mg/dL   Calcium  9.2 8.9 - 10.3 mg/dL   GFR, Estimated >39 >39 mL/min   Anion gap 11 5 - 15  Protime-INR     Status: Abnormal   Collection Time: 01/18/23  9:14 AM  Result Value Ref Range   Prothrombin Time 28.0 (H) 11.4 - 15.2 seconds   INR 2.6 (H) 0.8 - 1.2  Hepatic function panel     Status: Abnormal   Collection Time: 01/18/23  9:14 AM  Result Value Ref Range   Total Protein 6.4 (L) 6.5 - 8.1 g/dL   Albumin 3.6 3.5 - 5.0 g/dL   AST 33 15 - 41 U/L   ALT 33 0 - 44 U/L   Alkaline Phosphatase 132 (H) 38 - 126 U/L   Total Bilirubin 1.4 (H) 0.0 - 1.2 mg/dL   Bilirubin, Direct 0.2 0.0 - 0.2 mg/dL   Indirect Bilirubin 1.2 (H) 0.3 - 0.9 mg/dL   No results found for this or any previous visit (from the past  720 hours). CBC:    Latest Ref Rng & Units 01/18/2023    9:14 AM 02/21/2022    6:43 PM 09/27/2021    2:20 PM  CBC  WBC 4.0 - 10.5 K/uL 9.6  5.2  7.0   Hemoglobin 12.0 - 15.0 g/dL 86.3  87.7  87.1   Hematocrit 36.0 - 46.0 % 42.4  37.3  38.7   Platelets 150 - 400 K/uL 206  232  224    Basic Metabolic Panel: Recent Labs  Lab 01/18/23 0914  NA 138  K 3.8  CL 105  CO2 22  GLUCOSE 106*  BUN 18  CREATININE 0.72  CALCIUM  9.2   Creatinine: Lab Results  Component Value Date   CREATININE 0.72 01/18/2023   CREATININE 0.78 03/25/2022   CREATININE 0.79 02/21/2022    Liver Function Tests:    Latest Ref Rng & Units 01/18/2023    9:14 AM 02/21/2022    8:40 PM 06/16/2020   12:04 PM  Hepatic Function  Total Protein 6.5 - 8.1 g/dL 6.4  5.7  5.0   Albumin 3.5 - 5.0 g/dL 3.6  3.2  3.0   AST 15 - 41 U/L 33  23  22   ALT 0 - 44 U/L 33  18  14   Alk Phosphatase 38 - 126 U/L 132  94  87   Total Bilirubin 0.0 - 1.2 mg/dL 1.4  0.5  1.0   Bilirubin, Direct 0.0 - 0.2 mg/dL 0.2  0.2     Coagulation Profile: Recent Labs  Lab 01/18/23 0914  INR 2.6*   Cardiac Enzymes: No results for input(s): CKTOTAL, CKMB, CKMBINDEX, TROPONINI in the last 168 hours. BNP (last 3 results) No results for input(s): PROBNP in the last 8760 hours. HbA1C: No results for input(s): HGBA1C in the last 72 hours. Lipid Profile: No results for input(s): CHOL, HDL, LDLCALC, TRIG, CHOLHDL, LDLDIRECT in the last 72 hours. Urinalysis Pending.    Unresulted Labs (From admission, onward)     Start     Ordered   01/19/23 0500  CBC  Tomorrow morning,   R        01/18/23 1231   01/19/23 0500  Basic metabolic panel  Tomorrow morning,   R        01/18/23 1231   01/19/23 0500  Protime-INR  Daily,   R      01/18/23 1250   01/18/23 1229  Urinalysis, Routine w reflex microscopic -Urine, Random  Add-on,   AD       Question:  Specimen Source  Answer:  Urine, Random   01/18/23 1231             Radiological Exams on Admission: CT PELVIS W CONTRAST Result Date: 01/18/2023 CLINICAL DATA:  89 year old female status post fall with suspected acute pubic rami fractures. Previous bilateral hip replacement. EXAM: CT PELVIS WITH CONTRAST TECHNIQUE: Multidetector CT imaging of the pelvis was performed using the standard protocol following the bolus administration of intravenous contrast. RADIATION DOSE REDUCTION: This exam was performed according to the departmental dose-optimization program which includes automated exposure control, adjustment of the mA and/or kV according to patient size and/or use of iterative reconstruction technique. CONTRAST:  75mL OMNIPAQUE  IOHEXOL  350 MG/ML SOLN COMPARISON:  Radiographs this morning. CT Abdomen and Pelvis 04/08/2011. FINDINGS: Urinary Tract: Kidneys are not significantly changed since 2013, occasional simple fluid density benign renal cysts (no follow-up imaging recommended). No hydronephrosis. No hydroureter. Urinary bladder largely obscured from hip arthroplasty artifact. Bowel: Nondilated visible stomach, large and small bowel. No pneumoperitoneum or mesenteric inflammation identified. Vascular/Lymphatic: Extensive Aortoiliac calcified atherosclerosis. Visible major arterial structures are patent. No pelvic lymphadenopathy. Reproductive:  Negative. Other: Small volume of free fluid in the right hemipelvis appears to be simple fluid density on series 3, image 29. Musculoskeletal: Chronic bilateral hip arthroplasty which was present in 2013. Proximal left femoral cerclage wires appear to be new since that time. No implant dislocation. Osteopenia. No periprosthetic proximal femur fracture is identified. Lower lumbar  spine appears chronically degenerated but intact. Sacrum appears stable since 2013. Chronic left sacral ala fracture. SI joints appear intact. Iliac wings appear stable.  Pubic symphysis appears intact. Acute linear minimally displaced fracture of the  left superior pubic ramus near the junction with the acetabulum on series 5, image 48. Acute comminuted and mildly displaced inferior pubic ramus fracture on image 35. Contralateral chronic appearing pubic rami fractures which are new since 2013 (series 3, image 46). No superficial soft tissue injury identified. IMPRESSION: 1. Acute fractures of the left superior and inferior pubic rami. 2. Chronic appearing contralateral pubic rami fractures which are new since 2013. Chronic left sacral ala fracture. Chronic bilateral hip arthroplasty with adverse features or periprosthetic fracture identified. 3. Small volume of free fluid in the right hemipelvis is abnormal but nonspecific, perhaps reactive secondary to #1. 4.  Aortic Atherosclerosis (ICD10-I70.0). Electronically Signed   By: VEAR Hurst M.D.   On: 01/18/2023 11:12   DG Femur Min 2 Views Left Result Date: 01/18/2023 CLINICAL DATA:  Fall.  Left hip pain after a fall. EXAM: LEFT FEMUR 2 VIEWS COMPARISON:  None Available. FINDINGS: Status post left hip replacement with cerclage wires overlying the proximal femur. No evidence for periprosthetic fracture. Patient noted to be status post tricompartmental knee replacement without complicating features on this nondedicated study. As on the pelvis x-ray, fractures are identified in the left superior and inferior pubic rami, likely acute. IMPRESSION: 1. Status post left hip replacement without evidence for periprosthetic fracture. 2. Left superior and inferior pubic rami fractures, likely acute. Electronically Signed   By: Camellia Candle M.D.   On: 01/18/2023 10:04   DG Pelvis 1-2 Views Result Date: 01/18/2023 CLINICAL DATA:  Hip pain. EXAM: PELVIS - 1-2 VIEW COMPARISON:  None Available. FINDINGS: Bones are diffusely demineralized. Patient is status post bilateral hip replacement. Femoral component of each replacement has not been completely imaged on this study. Age indeterminate but probably acute fractures are seen in  the left superior and inferior pubic rami. Fracture in the right inferior pubic ramus is age indeterminate. IMPRESSION: 1. Age indeterminate but probably acute fractures in the left superior and inferior pubic rami. 2. Age indeterminate fracture in the right inferior pubic ramus. 3. Status post bilateral hip replacement. Femoral component of each replacement has not been completely imaged on this study. Electronically Signed   By: Camellia Candle M.D.   On: 01/18/2023 10:03    Data Reviewed: Relevant notes from primary care and specialist visits, past discharge summaries as available in EHR, including Care Everywhere. Prior diagnostic testing as pertinent to current admission diagnoses, Updated medications and problem lists for reconciliation ED course, including vitals, labs, imaging, treatment and response to treatment,Triage notes, nursing and pharmacy notes and ED provider's notes Notable results as noted in HPI.Discussed case with EDMD/ ED APP/ or Specialty MD on call and as needed.  Assessment and Plan: * Fall at home, initial encounter Suspect mechanical. Fall precaution and aspiration precautions. Aggressive PT and ?STR.    Fracture of multiple pubic rami, left, closed, initial encounter (HCC) Cont with pain control/ IVF hydration and Ortho follow up as outpatient with Dr. Melodi within a week from discharge  PT consult.  AP pelvis films post mobilization with therapy and WBAT and walker and 2 person assist.  Pain control.   PAF (paroxysmal atrial fibrillation) (HCC) Currently in A.fib with prolong qtc of 504 with q waves in v1/v2.  We will continue pt on Coumadin .  Pharmacy consult  for [facility] plan and management.  Fall precaution.   -CHA2DS2/VAS Stroke Risk Points  Current as of 11 minutes ago     6 >= 2 Points: High Risk          Hypertension Vitals:   01/18/23 0858 01/18/23 0900 01/18/23 1130  BP: (!) 179/108 (!) 179/94 (!) 152/90   Continue metoprolol  ( lopressor  BID )  and hold losartan  / hydralazine  and start low dose hydrochlorothiazide  for ISH with parameters of SBP of 151 and above.   DNR (do not resuscitate) discussion D/W patient about code status and she wishes to be DNR and DNI. States she has a living will as well. She DOES NOT WANT ANY RESUSCITATION MEASURES.    Prognosis: Stable.  DVT prophylaxis:  Coumadin .  Consults:  None  Advance Care Planning:    Code Status: Limited: Do not attempt resuscitation (DNR) -DNR-LIMITED -Do Not Intubate/DNI    Family Communication:  None  Disposition Plan:  TBD. Severity of Illness: The appropriate patient status for this patient is INPATIENT. Inpatient status is judged to be reasonable and necessary in order to provide the required intensity of service to ensure the patient's safety. The patient's presenting symptoms, physical exam findings, and initial radiographic and laboratory data in the context of their chronic comorbidities is felt to place them at high risk for further clinical deterioration. Furthermore, it is not anticipated that the patient will be medically stable for discharge from the hospital within 2 midnights of admission.  * I certify that at the point of admission it is my clinical judgment that the patient will require inpatient hospital care spanning beyond 2 midnights from the point of admission due to high intensity of service, high risk for further deterioration and high frequency of surveillance required.*  Author: Mario LULLA Blanch, MD 01/18/2023 1:59 PM  For on call review [email or web address].   Orders Placed This Encounter  Procedures   DG Femur Min 2 Views Left   DG Pelvis 1-2 Views   CT PELVIS W CONTRAST   CBC with Differential   Basic metabolic panel   Protime-INR   Hepatic function panel   Urinalysis, Routine w reflex microscopic -Urine, Random   CBC   Basic metabolic panel   Protime-INR   Diet Heart Room service appropriate? Yes; Fluid consistency: Thin   Vital signs every  hour x 4, then every 4 hours   Neurovascular checks every hour x 4, then every 4 hours   Bed with an overhead patient helper or overhead frame with a trapeze bar   Apply ice to affected area   Elevate heels off of bed   Turn cough deep breathe   Incentive spirometry   Bed rest with HOB to 30 degrees   Intake and output   If diabetic or glucose greater than 140mg /dl, notify physician to place Gylcemic Control (SSI) Order Set   Initiate Oral Care Protocol   Initiate Carrier Fluid Protocol   Document Pasero Opioid-Induced Sedation Scale (POSS) per protocol (see sidebar report)   Cardiac Monitoring Continuous x 48 hours Indications for use: Other; Other indications for use: A.FIB.   Do not attempt resuscitation (DNR)- Limited -Do Not Intubate (DNI)   Consult to orthopedic surgery   Consult to hospitalist   warfarin (COUMADIN ) per pharmacy consult   Consult to Transition of Care Team   Consult to Registered Dietitian   PT eval and treat   Oxygen  therapy Mode or (Route): Nasal cannula; Liters Per Minute: 2  Pulse oximetry, continuous   Pulse oximetry, every 4 hours   EKG 12-Lead   EKG 12-Lead   EKG 12-Lead   Admit to Inpatient (patient's expected length of stay will be greater than 2 midnights or inpatient only procedure)

## 2023-01-18 NOTE — Assessment & Plan Note (Addendum)
 Currently in A.fib with prolong qtc of 504 with q waves in v1/v2.  We will continue pt on Coumadin .  Pharmacy consult for [facility] plan and management.  Fall precaution.   -CHA2DS2/VAS Stroke Risk Points  Current as of 11 minutes ago     6 >= 2 Points: High Risk

## 2023-01-18 NOTE — ED Triage Notes (Signed)
 Pt arrived via [facility] from home for a fall. Pt was getting out of bed at 0720 today and fell on her entire left side. Pt denies hitting head or LOC. Pt is on coumadin . Pt c/o left hip pain. Per EMS, no hip deformity or rotation. Pt has 1+ left pedal pulse, pt able to move foot. Pt is A&Ox4.

## 2023-01-18 NOTE — Assessment & Plan Note (Addendum)
 Vitals:   01/18/23 0858 01/18/23 0900 01/18/23 1130  BP: (!) 179/108 (!) 179/94 (!) 152/90   Continue metoprolol ( lopressor BID ) and hold losartan / hydralazine and start low dose hydrochlorothiazide for ISH with parameters of SBP of 151 and above.

## 2023-01-18 NOTE — Assessment & Plan Note (Addendum)
 Cont with pain control/ IVF hydration and Ortho follow up as outpatient with Dr. Lequita Halt within a week from discharge  PT consult.  AP pelvis films post mobilization with therapy and WBAT and walker and 2 person assist.  Pain control.

## 2023-01-18 NOTE — ED Notes (Signed)
 ED TO INPATIENT HANDOFF REPORT  ED Nurse Name and Phone #: Shelba [phone]  S Name/Age/Gender Gloria [name] 89 y.o. female Room/Bed: 044C/044C  Code Status   Code Status: Limited: Do not attempt resuscitation (DNR) -DNR-LIMITED -Do Not Intubate/DNI   Home/SNF/Other Home Patient oriented to: self, place, time, and situation Is this baseline? Yes   Triage Complete: Triage complete  Chief Complaint Fall at home, initial encounter [W19.XXXA, Y92.009]  Triage Note Pt arrived via [facility] from home for a fall. Pt was getting out of bed at 0720 today and fell on her entire left side. Pt denies hitting head or LOC. Pt is on coumadin . Pt c/o left hip pain. Per EMS, no hip deformity or rotation. Pt has 1+ left pedal pulse, pt able to move foot. Pt is A&Ox4.    Allergies Allergies  Allergen Reactions   Amlodipine  Swelling   Cephalexin Other (See Comments)    unknown   Ciprofloxacin  Nausea And Vomiting   Codeine Other (See Comments)    unknown   Metoclopramide  Hcl Other (See Comments)    unknown   Nitrofuran Derivatives Other (See Comments)    unknown   Rosuvastatin     Other reaction(s): Myalgias    Level of Care/Admitting Diagnosis ED Disposition     ED Disposition  Admit   Condition  --   Comment  Hospital Area: [facility] [[id number]]  Level of Care: Telemetry Medical [104]  May admit patient to [facility] or [facility] if equivalent level of care is available:: Yes  Covid Evaluation: Asymptomatic - no recent exposure (last 10 days) testing not required  Diagnosis: Fall at home, initial encounter [[id number]]  Admitting Physician: TOBIE MARIO LULLA DEDRA  Attending Physician: TOBIE MARIO LULLA DEDRA  Certification:: I certify this patient will need inpatient services for at least 2 midnights  Expected Medical Readiness: 01/20/2023          B Medical/Surgery History Past Medical History:  Diagnosis Date   Arthritis    right   Atrial fibrillation  (HCC)    GERD (gastroesophageal reflux disease)    hx of   Hepatitis C    Humerus fracture    Hyperlipidemia    Hypertension    Irritable bowel syndrome    Meningioma (HCC)    PONV (postoperative nausea and vomiting)    Past Surgical History:  Procedure Laterality Date   BREAST SURGERY     lumpectomy   CARDIOVASCULAR STRESS TEST  11/14/2006   EF 67%   COLONOSCOPY W/ BIOPSIES AND POLYPECTOMY     ORIF HUMERUS FRACTURE Right 04/18/2014   Procedure: OPEN REDUCTION INTERNAL FIXATION (ORIF) RIGHT PROXIMAL HUMERUS FRACTURE;  Surgeon: Marcey Her, MD;  Location: [facility] OR;  Service: Orthopedics;  Laterality: Right;   ORIF PERIPROSTHETIC FRACTURE Left 04/30/2013   Procedure: OPEN REDUCTION INTERNAL FIXATION (ORIF) PERIPROSTHETIC FRACTURE;  Surgeon: Donnice JONETTA Car, MD;  Location: [facility] OR;  Service: Orthopedics;  Laterality: Left;   PAROTID GLAND TUMOR EXCISION     TOTAL HIP ARTHROPLASTY  11/07   left   TOTAL KNEE ARTHROPLASTY     TOTAL KNEE ARTHROPLASTY Left 11/24/2017   Procedure: TOTAL KNEE ARTHROPLASTY;  Surgeon: Melodi Lerner, MD;  Location: [facility] ORS;  Service: Orthopedics;  Laterality: Left;    TRANSTHORACIC ECHOCARDIOGRAM  10/25/2009   EF 55-60%     A IV Location/Drains/Wounds Patient Lines/Drains/Airways Status     Active Line/Drains/Airways     Name Placement date Placement time Site Days  Peripheral IV 01/18/23 20 G 1 Anterior;Right Forearm 01/18/23  0913  Forearm  less than 1   External Urinary Catheter 01/18/23  1201  --  less than 1            Intake/Output Last 24 hours No intake or output data in the 24 hours ending 01/18/23 2023  Labs/Imaging Results for orders placed or performed during the hospital encounter of 01/18/23 (from the past 48 hours)  CBC with Differential     Status: Abnormal   Collection Time: 01/18/23  9:14 AM  Result Value Ref Range   WBC 9.6 4.0 - 10.5 K/uL   RBC 4.65 3.87 - 5.11 MIL/uL   Hemoglobin 13.6 12.0 - 15.0 g/dL   HCT 57.5 63.9  - 53.9 %   MCV 91.2 80.0 - 100.0 fL   MCH 29.2 26.0 - 34.0 pg   MCHC 32.1 30.0 - 36.0 g/dL   RDW 85.0 88.4 - 84.4 %   Platelets 206 150 - 400 K/uL   nRBC 0.0 0.0 - 0.2 %   Neutrophils Relative % 82 %   Neutro Abs 7.8 (H) 1.7 - 7.7 K/uL   Lymphocytes Relative 10 %   Lymphs Abs 0.9 0.7 - 4.0 K/uL   Monocytes Relative 6 %   Monocytes Absolute 0.6 0.1 - 1.0 K/uL   Eosinophils Relative 1 %   Eosinophils Absolute 0.1 0.0 - 0.5 K/uL   Basophils Relative 0 %   Basophils Absolute 0.0 0.0 - 0.1 K/uL   Immature Granulocytes 1 %   Abs Immature Granulocytes 0.13 (H) 0.00 - 0.07 K/uL    Comment: Performed at [facility], [address] [place], [place] [address]  Basic metabolic panel     Status: Abnormal   Collection Time: 01/18/23  9:14 AM  Result Value Ref Range   Sodium 138 135 - 145 mmol/L   Potassium 3.8 3.5 - 5.1 mmol/L   Chloride 105 98 - 111 mmol/L   CO2 22 22 - 32 mmol/L   Glucose, Bld 106 (H) 70 - 99 mg/dL    Comment: Glucose reference range applies only to samples taken after fasting for at least 8 hours.   BUN 18 8 - 23 mg/dL   Creatinine, Ser 9.27 0.44 - 1.00 mg/dL   Calcium  9.2 8.9 - 10.3 mg/dL   GFR, Estimated >39 >39 mL/min    Comment: (NOTE) Calculated using the CKD-EPI Creatinine Equation (2021)    Anion gap 11 5 - 15    Comment: Performed at [facility], [address] [place], [place] [address]  Protime-INR     Status: Abnormal   Collection Time: 01/18/23  9:14 AM  Result Value Ref Range   Prothrombin Time 28.0 (H) 11.4 - 15.2 seconds   INR 2.6 (H) 0.8 - 1.2    Comment: (NOTE) INR goal varies based on device and disease states. Performed at [facility], [address], [place], [place] [address]   Hepatic function panel     Status: Abnormal   Collection Time: 01/18/23  9:14 AM  Result Value Ref Range   Total Protein 6.4 (L) 6.5 - 8.1 g/dL   Albumin 3.6 3.5 - 5.0 g/dL   AST 33 15 - 41 U/L   ALT 33 0 - 44 U/L   Alkaline Phosphatase  132 (H) 38 - 126 U/L   Total Bilirubin 1.4 (H) 0.0 - 1.2 mg/dL   Bilirubin, Direct 0.2 0.0 - 0.2 mg/dL  Indirect Bilirubin 1.2 (H) 0.3 - 0.9 mg/dL    Comment: Performed at [facility], [address], [place], [place] [address]  Magnesium      Status: None   Collection Time: 01/18/23  9:14 AM  Result Value Ref Range   Magnesium  2.0 1.7 - 2.4 mg/dL    Comment: Performed at [facility], [address] [place], [place] [address]   CT PELVIS W CONTRAST Result Date: 01/18/2023 CLINICAL DATA:  89 year old female status post fall with suspected acute pubic rami fractures. Previous bilateral hip replacement. EXAM: CT PELVIS WITH CONTRAST TECHNIQUE: Multidetector CT imaging of the pelvis was performed using the standard protocol following the bolus administration of intravenous contrast. RADIATION DOSE REDUCTION: This exam was performed according to the departmental dose-optimization program which includes automated exposure control, adjustment of the mA and/or kV according to patient size and/or use of iterative reconstruction technique. CONTRAST:  75mL OMNIPAQUE  IOHEXOL  350 MG/ML SOLN COMPARISON:  Radiographs this morning. CT Abdomen and Pelvis 04/08/2011. FINDINGS: Urinary Tract: Kidneys are not significantly changed since 2013, occasional simple fluid density benign renal cysts (no follow-up imaging recommended). No hydronephrosis. No hydroureter. Urinary bladder largely obscured from hip arthroplasty artifact. Bowel: Nondilated visible stomach, large and small bowel. No pneumoperitoneum or mesenteric inflammation identified. Vascular/Lymphatic: Extensive Aortoiliac calcified atherosclerosis. Visible major arterial structures are patent. No pelvic lymphadenopathy. Reproductive:  Negative. Other: Small volume of free fluid in the right hemipelvis appears to be simple fluid density on series 3, image 29. Musculoskeletal: Chronic bilateral hip arthroplasty which was present in 2013. Proximal left  femoral cerclage wires appear to be new since that time. No implant dislocation. Osteopenia. No periprosthetic proximal femur fracture is identified. Lower lumbar spine appears chronically degenerated but intact. Sacrum appears stable since 2013. Chronic left sacral ala fracture. SI joints appear intact. Iliac wings appear stable.  Pubic symphysis appears intact. Acute linear minimally displaced fracture of the left superior pubic ramus near the junction with the acetabulum on series 5, image 48. Acute comminuted and mildly displaced inferior pubic ramus fracture on image 35. Contralateral chronic appearing pubic rami fractures which are new since 2013 (series 3, image 46). No superficial soft tissue injury identified. IMPRESSION: 1. Acute fractures of the left superior and inferior pubic rami. 2. Chronic appearing contralateral pubic rami fractures which are new since 2013. Chronic left sacral ala fracture. Chronic bilateral hip arthroplasty with adverse features or periprosthetic fracture identified. 3. Small volume of free fluid in the right hemipelvis is abnormal but nonspecific, perhaps reactive secondary to #1. 4.  Aortic Atherosclerosis (ICD10-I70.0). Electronically Signed   By: VEAR Hurst M.D.   On: 01/18/2023 11:12   DG Femur Min 2 Views Left Result Date: 01/18/2023 CLINICAL DATA:  Fall.  Left hip pain after a fall. EXAM: LEFT FEMUR 2 VIEWS COMPARISON:  None Available. FINDINGS: Status post left hip replacement with cerclage wires overlying the proximal femur. No evidence for periprosthetic fracture. Patient noted to be status post tricompartmental knee replacement without complicating features on this nondedicated study. As on the pelvis x-ray, fractures are identified in the left superior and inferior pubic rami, likely acute. IMPRESSION: 1. Status post left hip replacement without evidence for periprosthetic fracture. 2. Left superior and inferior pubic rami fractures, likely acute. Electronically Signed    By: Camellia Candle M.D.   On: 01/18/2023 10:04   DG Pelvis 1-2 Views Result Date: 01/18/2023 CLINICAL DATA:  Hip pain. EXAM: PELVIS - 1-2 VIEW COMPARISON:  None  Available. FINDINGS: Bones are diffusely demineralized. Patient is status post bilateral hip replacement. Femoral component of each replacement has not been completely imaged on this study. Age indeterminate but probably acute fractures are seen in the left superior and inferior pubic rami. Fracture in the right inferior pubic ramus is age indeterminate. IMPRESSION: 1. Age indeterminate but probably acute fractures in the left superior and inferior pubic rami. 2. Age indeterminate fracture in the right inferior pubic ramus. 3. Status post bilateral hip replacement. Femoral component of each replacement has not been completely imaged on this study. Electronically Signed   By: Camellia Candle M.D.   On: 01/18/2023 10:03    Pending Labs Unresulted Labs (From admission, onward)     Start     Ordered   01/19/23 0500  CBC  Tomorrow morning,   R        01/18/23 1231   01/19/23 0500  Basic metabolic panel  Tomorrow morning,   R        01/18/23 1231   01/19/23 0500  Protime-INR  Daily,   R      01/18/23 1250   01/18/23 1229  Urinalysis, Routine w reflex microscopic -Urine, Random  Add-on,   AD       Question:  Specimen Source  Answer:  Urine, Random   01/18/23 1231            Vitals/Pain Today's Vitals   01/18/23 1330 01/18/23 1400 01/18/23 1700 01/18/23 2000  BP: (!) 141/87 (!) 143/103 (!) 156/92 (!) 155/80  Pulse: 88 (!) 106 97 79  Resp: 16 (!) 25 16 18   Temp:  98 F (36.7 C)  98.2 F (36.8 C)  TempSrc:    Oral  SpO2: 100% 99% 100% 98%  Weight:      Height:      PainSc:    1     Isolation Precautions No active isolations  Medications Medications  atorvastatin  (LIPITOR) tablet 20 mg (has no administration in time range)  metoprolol  tartrate (LOPRESSOR ) tablet 25 mg (25 mg Oral Given 01/18/23 1356)  hydrochlorothiazide   (HYDRODIURIL ) tablet 6.25 mg (6.25 mg Oral Not Given 01/18/23 1348)  HYDROcodone -acetaminophen  (NORCO/VICODIN) 5-325 MG per tablet 1-2 tablet (has no administration in time range)  docusate sodium  (COLACE) capsule 100 mg (100 mg Oral Given 01/18/23 1356)  polyethylene glycol (MIRALAX  / GLYCOLAX ) packet 17 g (has no administration in time range)  bisacodyl  (DULCOLAX) EC tablet 5 mg (has no administration in time range)  dextrose  5 %-0.9 % sodium chloride  infusion ( Intravenous New Bag/Given 01/18/23 1355)  methocarbamol  (ROBAXIN ) tablet 500 mg (has no administration in time range)    Or  methocarbamol  (ROBAXIN ) injection 500 mg (has no administration in time range)  Warfarin - Pharmacist Dosing Inpatient ( Does not apply Given 01/18/23 1722)  HYDROmorphone  (DILAUDID ) injection 1 mg (has no administration in time range)  morphine  (PF) 4 MG/ML injection 4 mg (4 mg Intravenous Given 01/18/23 0918)  ondansetron  (ZOFRAN ) injection 4 mg (4 mg Intravenous Given 01/18/23 0918)  iohexol  (OMNIPAQUE ) 350 MG/ML injection 75 mL (75 mLs Intravenous Contrast Given 01/18/23 1101)  morphine  (PF) 4 MG/ML injection 4 mg (4 mg Intravenous Given 01/18/23 1146)  warfarin (COUMADIN ) tablet 3 mg (3 mg Oral Given 01/18/23 1722)    Mobility non-ambulatory     Focused Assessments Cardiac Assessment Handoff:  Cardiac Rhythm: Atrial fibrillation Lab Results  Component Value Date   TROPONINI <0.03 06/05/2016   No results found for: DDIMER Does the Patient  currently have chest pain? No    R Recommendations: See Admitting Provider Note  Report given to:   Additional Notes: HOH, hearing aids

## 2023-01-18 NOTE — ED Notes (Signed)
 Patient noted to desat while sleeping, placed on 3 lpm

## 2023-01-18 NOTE — Assessment & Plan Note (Signed)
 D/W patient about code status and she wishes to be DNR and DNI. States she has a living will as well. She DOES NOT WANT ANY RESUSCITATION MEASURES.

## 2023-01-18 NOTE — Consult Note (Signed)
 Patient ID: Gloria [name] MRN: [id number] DOB/AGE: [birth year] 88 y.o.  Admit date: 01/18/2023  Admission Diagnoses:  Principal Problem:   Fall at home, initial encounter Active Problems:   Hypertension   PAF (paroxysmal atrial fibrillation) (HCC)   HPI: Ortho consult for left superior and inferior pubic rami fx following fall today when she was getting out of bed. S/p left THA 2007 and TKA 2019 by Dr. Melodi and left hip periprosthetic ORIF 2015 by Dr. Ernie.  PMH notable for hypertension, hyperlipidemia, GERD and PAF on coumadin .  Denies numbness/tingling.  Past Medical History: Past Medical History:  Diagnosis Date   Arthritis    right   Atrial fibrillation (HCC)    GERD (gastroesophageal reflux disease)    hx of   Hepatitis C    Humerus fracture    Hyperlipidemia    Hypertension    Irritable bowel syndrome    Meningioma (HCC)    PONV (postoperative nausea and vomiting)     Surgical History: Past Surgical History:  Procedure Laterality Date   BREAST SURGERY     lumpectomy   CARDIOVASCULAR STRESS TEST  11/14/2006   EF 67%   COLONOSCOPY W/ BIOPSIES AND POLYPECTOMY     ORIF HUMERUS FRACTURE Right 04/18/2014   Procedure: OPEN REDUCTION INTERNAL FIXATION (ORIF) RIGHT PROXIMAL HUMERUS FRACTURE;  Surgeon: Marcey Her, MD;  Location: [facility] OR;  Service: Orthopedics;  Laterality: Right;   ORIF PERIPROSTHETIC FRACTURE Left 04/30/2013   Procedure: OPEN REDUCTION INTERNAL FIXATION (ORIF) PERIPROSTHETIC FRACTURE;  Surgeon: Donnice JONETTA Ernie, MD;  Location: [facility] OR;  Service: Orthopedics;  Laterality: Left;   PAROTID GLAND TUMOR EXCISION     TOTAL HIP ARTHROPLASTY  11/07   left   TOTAL KNEE ARTHROPLASTY     TOTAL KNEE ARTHROPLASTY Left 11/24/2017   Procedure: TOTAL KNEE ARTHROPLASTY;  Surgeon: Melodi Lerner, MD;  Location: [facility] ORS;  Service: Orthopedics;  Laterality: Left;    TRANSTHORACIC ECHOCARDIOGRAM  10/25/2009   EF 55-60%    Family History: Family History   Problem Relation Age of Onset   Heart disease Brother    Breast cancer Neg Hx     Social History: Social History   Socioeconomic History   Marital status: Widowed    Spouse name: Not on file   Number of children: 2   Years of education: Not on file   Highest education level: Not on file  Occupational History    Employer: RETIRED  Tobacco Use   Smoking status: Former    Current packs/day: 0.00    Average packs/day: 0.3 packs/day for 15.0 years (4.5 ttl pk-yrs)    Types: Cigarettes    Start date: 06/07/1965    Quit date: 06/07/1980    Years since quitting: 42.6   Smokeless tobacco: Never  Vaping Use   Vaping status: Never Used  Substance and Sexual Activity   Alcohol  use: No   Drug use: No   Sexual activity: Never  Other Topics Concern   Not on file  Social History Narrative   Not on file   Social Drivers of Health   Financial Resource Strain: Not on file  Food Insecurity: Not on file  Transportation Needs: Not on file  Physical Activity: Not on file  Stress: Not on file  Social Connections: Not on file  Intimate Partner Violence: Not on file    Allergies: Amlodipine , Cephalexin, Ciprofloxacin , Codeine, Metoclopramide  hcl, Nitrofuran derivatives, and Rosuvastatin  Medications: I have reviewed the patient's current medications.  Vital Signs: Patient Vitals for the past 24 hrs:  BP Temp Temp src Pulse Resp SpO2 Height Weight  01/18/23 1130 (!) 152/90 -- -- 100 18 91 % -- --  01/18/23 0900 (!) 179/94 -- -- 81 -- 96 % -- --  01/18/23 0859 -- -- -- -- -- -- 5' 3 (1.6 m) 65 kg  01/18/23 0858 (!) 179/108 98.2 F (36.8 C) Oral 83 20 96 % -- --  01/18/23 0855 -- -- -- -- -- 96 % -- --    Radiology: CT PELVIS W CONTRAST Result Date: 01/18/2023 CLINICAL DATA:  89 year old female status post fall with suspected acute pubic rami fractures. Previous bilateral hip replacement. EXAM: CT PELVIS WITH CONTRAST TECHNIQUE: Multidetector CT imaging of the pelvis was performed  using the standard protocol following the bolus administration of intravenous contrast. RADIATION DOSE REDUCTION: This exam was performed according to the departmental dose-optimization program which includes automated exposure control, adjustment of the mA and/or kV according to patient size and/or use of iterative reconstruction technique. CONTRAST:  75mL OMNIPAQUE  IOHEXOL  350 MG/ML SOLN COMPARISON:  Radiographs this morning. CT Abdomen and Pelvis 04/08/2011. FINDINGS: Urinary Tract: Kidneys are not significantly changed since 2013, occasional simple fluid density benign renal cysts (no follow-up imaging recommended). No hydronephrosis. No hydroureter. Urinary bladder largely obscured from hip arthroplasty artifact. Bowel: Nondilated visible stomach, large and small bowel. No pneumoperitoneum or mesenteric inflammation identified. Vascular/Lymphatic: Extensive Aortoiliac calcified atherosclerosis. Visible major arterial structures are patent. No pelvic lymphadenopathy. Reproductive:  Negative. Other: Small volume of free fluid in the right hemipelvis appears to be simple fluid density on series 3, image 29. Musculoskeletal: Chronic bilateral hip arthroplasty which was present in 2013. Proximal left femoral cerclage wires appear to be new since that time. No implant dislocation. Osteopenia. No periprosthetic proximal femur fracture is identified. Lower lumbar spine appears chronically degenerated but intact. Sacrum appears stable since 2013. Chronic left sacral ala fracture. SI joints appear intact. Iliac wings appear stable.  Pubic symphysis appears intact. Acute linear minimally displaced fracture of the left superior pubic ramus near the junction with the acetabulum on series 5, image 48. Acute comminuted and mildly displaced inferior pubic ramus fracture on image 35. Contralateral chronic appearing pubic rami fractures which are new since 2013 (series 3, image 46). No superficial soft tissue injury identified.  IMPRESSION: 1. Acute fractures of the left superior and inferior pubic rami. 2. Chronic appearing contralateral pubic rami fractures which are new since 2013. Chronic left sacral ala fracture. Chronic bilateral hip arthroplasty with adverse features or periprosthetic fracture identified. 3. Small volume of free fluid in the right hemipelvis is abnormal but nonspecific, perhaps reactive secondary to #1. 4.  Aortic Atherosclerosis (ICD10-I70.0). Electronically Signed   By: VEAR Hurst M.D.   On: 01/18/2023 11:12   DG Femur Min 2 Views Left Result Date: 01/18/2023 CLINICAL DATA:  Fall.  Left hip pain after a fall. EXAM: LEFT FEMUR 2 VIEWS COMPARISON:  None Available. FINDINGS: Status post left hip replacement with cerclage wires overlying the proximal femur. No evidence for periprosthetic fracture. Patient noted to be status post tricompartmental knee replacement without complicating features on this nondedicated study. As on the pelvis x-ray, fractures are identified in the left superior and inferior pubic rami, likely acute. IMPRESSION: 1. Status post left hip replacement without evidence for periprosthetic fracture. 2. Left superior and inferior pubic rami fractures, likely acute. Electronically Signed   By: Camellia Candle M.D.   On: 01/18/2023 10:04   DG  Pelvis 1-2 Views Result Date: 01/18/2023 CLINICAL DATA:  Hip pain. EXAM: PELVIS - 1-2 VIEW COMPARISON:  None Available. FINDINGS: Bones are diffusely demineralized. Patient is status post bilateral hip replacement. Femoral component of each replacement has not been completely imaged on this study. Age indeterminate but probably acute fractures are seen in the left superior and inferior pubic rami. Fracture in the right inferior pubic ramus is age indeterminate. IMPRESSION: 1. Age indeterminate but probably acute fractures in the left superior and inferior pubic rami. 2. Age indeterminate fracture in the right inferior pubic ramus. 3. Status post bilateral hip  replacement. Femoral component of each replacement has not been completely imaged on this study. Electronically Signed   By: Camellia Candle M.D.   On: 01/18/2023 10:03    Labs: Recent Labs    01/18/23 0914  WBC 9.6  RBC 4.65  HCT 42.4  PLT 206   Recent Labs    01/18/23 0914  NA 138  K 3.8  CL 105  CO2 22  BUN 18  CREATININE 0.72  GLUCOSE 106*  CALCIUM  9.2   Recent Labs    01/18/23 0914  INR 2.6*    Review of Systems: ROS as detailed in HPI  Physical Exam: Body mass index is 25.38 kg/m.  Physical Exam   Gen: AAOx3, NAD Comfortable at rest  Left Lower Extremity: Skin intact Well healed scars HF/HE/KF/KE/ADF/APF/EHL 5/5 SILT throughout DP, PT 2+ to palp CR < 2s   Assessment and Plan: Ortho consult for left superior and inferior pubic rami fx, DO I1/11/25  -history, exam and imaging reviewed at length with patient -no acute surgical intervention at this time -please obtain AP pelvis films post mobilization with therapy -WBAT with walker and 2 person assist at all times -PT/OT -follow up as outpatient with Dr. Melodi within a week from discharge  Lillia Mountain, MD Orthopaedic Surgeon [facility] [phone]

## 2023-01-18 NOTE — Assessment & Plan Note (Signed)
 Suspect mechanical. Fall precaution and aspiration precautions. Aggressive PT and ?STR.

## 2023-01-19 DIAGNOSIS — W19XXXA Unspecified fall, initial encounter: Secondary | ICD-10-CM | POA: Diagnosis not present

## 2023-01-19 DIAGNOSIS — I10 Essential (primary) hypertension: Secondary | ICD-10-CM | POA: Diagnosis not present

## 2023-01-19 DIAGNOSIS — Y92009 Unspecified place in unspecified non-institutional (private) residence as the place of occurrence of the external cause: Secondary | ICD-10-CM | POA: Diagnosis not present

## 2023-01-19 LAB — CBC
HCT: 36.6 % (ref 36.0–46.0)
Hemoglobin: 11.8 g/dL — ABNORMAL LOW (ref 12.0–15.0)
MCH: 29.1 pg (ref 26.0–34.0)
MCHC: 32.2 g/dL (ref 30.0–36.0)
MCV: 90.4 fL (ref 80.0–100.0)
Platelets: 171 10*3/uL (ref 150–400)
RBC: 4.05 MIL/uL (ref 3.87–5.11)
RDW: 15 % (ref 11.5–15.5)
WBC: 5 10*3/uL (ref 4.0–10.5)
nRBC: 0 % (ref 0.0–0.2)

## 2023-01-19 LAB — BASIC METABOLIC PANEL
Anion gap: 8 (ref 5–15)
BUN: 13 mg/dL (ref 8–23)
CO2: 25 mmol/L (ref 22–32)
Calcium: 8.8 mg/dL — ABNORMAL LOW (ref 8.9–10.3)
Chloride: 107 mmol/L (ref 98–111)
Creatinine, Ser: 0.72 mg/dL (ref 0.44–1.00)
GFR, Estimated: >60 mL/min (ref >60)
Glucose, Bld: 107 mg/dL — ABNORMAL HIGH (ref 70–99)
Potassium: 3.8 mmol/L (ref 3.5–5.1)
Sodium: 140 mmol/L (ref 135–145)

## 2023-01-19 LAB — PROTIME-INR
INR: 2.6 — ABNORMAL HIGH (ref 0.8–1.2)
Prothrombin Time: 28 s — ABNORMAL HIGH (ref 11.4–15.2)

## 2023-01-19 MED ORDER — LOSARTAN POTASSIUM 50 MG PO TABS
25.0000 mg | ORAL_TABLET | Freq: Every day | ORAL | Status: DC
  Administered 2023-01-19 – 2023-01-21 (×3): 25 mg via ORAL
  Filled 2023-01-19 (×3): qty 1

## 2023-01-19 MED ORDER — METOPROLOL SUCCINATE ER 25 MG PO TB24
25.0000 mg | ORAL_TABLET | Freq: Every day | ORAL | Status: DC
  Administered 2023-01-19 – 2023-01-21 (×3): 25 mg via ORAL
  Filled 2023-01-19 (×3): qty 1

## 2023-01-19 MED ORDER — OXYCODONE HCL 5 MG PO TABS
5.0000 mg | ORAL_TABLET | Freq: Four times a day (QID) | ORAL | Status: DC | PRN
  Administered 2023-01-19 – 2023-01-21 (×2): 5 mg via ORAL
  Filled 2023-01-19 (×2): qty 1

## 2023-01-19 MED ORDER — WARFARIN SODIUM 3 MG PO TABS
3.0000 mg | ORAL_TABLET | Freq: Once | ORAL | Status: AC
  Administered 2023-01-19: 3 mg via ORAL
  Filled 2023-01-19: qty 1

## 2023-01-19 MED ORDER — POLYETHYLENE GLYCOL 3350 17 G PO PACK
17.0000 g | PACK | Freq: Every day | ORAL | Status: DC
Start: 2023-01-19 — End: 2023-01-21
  Administered 2023-01-19 – 2023-01-21 (×3): 17 g via ORAL
  Filled 2023-01-19 (×3): qty 1

## 2023-01-19 MED ORDER — ACETAMINOPHEN 500 MG PO TABS
1000.0000 mg | ORAL_TABLET | Freq: Three times a day (TID) | ORAL | Status: DC
  Administered 2023-01-19 – 2023-01-21 (×7): 1000 mg via ORAL
  Filled 2023-01-19 (×7): qty 2

## 2023-01-19 MED ORDER — PANTOPRAZOLE SODIUM 40 MG PO TBEC
40.0000 mg | DELAYED_RELEASE_TABLET | Freq: Every day | ORAL | Status: DC
  Administered 2023-01-19 – 2023-01-21 (×3): 40 mg via ORAL
  Filled 2023-01-19 (×3): qty 1

## 2023-01-19 NOTE — Progress Notes (Signed)
 Went to introduce myself to the patient at the beginning of the shift,patient said she doesn't want me to be her nurse nurse because she doesn't like and trust me.Charge nurse notified ,who got another Rn to give her night medicines,patient took the medicines without any objection.Unable to perform shift assessment due to the patient refusal.

## 2023-01-19 NOTE — Evaluation (Signed)
 Physical Therapy Evaluation Patient Details Name: [name] MRN: [id number] DOB: Jun 05, 1930 Today's Date: 01/19/2023  History of Present Illness  Gloria [name] is a 89 y.o. female who is admitted post a fall at her home, resulting in L superior and Inferior pubic rami fxs, non-displaced; will be managed nonop, WBAT; with past medical history of tobacco abuse, essential hypertension  hypertension, hyperlipidemia,  Clinical Impression   Pt admitted with above diagnosis. Lives at home alone, in a single-level home with 4 steps to enter; Prior to admission, pt was able to manage independently, walking with RW, independent with basic ADLs; Daughter gives rides when she can; Presents to PT with a functional decline compared to baseline, LLE pain with sitting up and standing, decr functional mobility; Needs light mod assist to get up to EOB, min assist to stand, and walk with RW in the hallway; Patient will benefit from continued inpatient follow up therapy, <3 hours/day; Reports she had good outcomes after rehab stays at [facility] in the past and would like to return there to maximize independence and safety with mobility and ADLs;  Pt currently with functional limitations due to the deficits listed below (see PT Problem List). Pt will benefit from skilled PT to increase their independence and safety with mobility to allow discharge to the venue listed below.           If plan is discharge home, recommend the following: A lot of help with walking and/or transfers;A lot of help with bathing/dressing/bathroom;Assistance with cooking/housework;Help with stairs or ramp for entrance;Assist for transportation   Can travel by private vehicle   No (perhaps soon)    Equipment Recommendations None recommended by PT (well-aquipped)  Recommendations for Other Services  Other (comment) (TOC; pt would like to go to [facility] for post-acute rehab)    Functional Status Assessment Patient has had a recent  decline in their functional status and demonstrates the ability to make significant improvements in function in a reasonable and predictable amount of time.     Precautions / Restrictions Precautions Precautions: Fall Precaution Comments: Noting pt had been seen for vestibular dysfunction in the past Restrictions Weight Bearing Restrictions Per Provider Order: Yes LLE Weight Bearing Per Provider Order: Weight bearing as tolerated      Mobility  Bed Mobility Overal bed mobility: Needs Assistance Bed Mobility: Supine to Sit     Supine to sit: Mod assist     General bed mobility comments: Moves bil LEs towards the R edge of bed well, folowing cues for LE positioning; light mod assist to help reciprocally scoot hips to EOB and squre up and get feet to the floor    Transfers Overall transfer level: Needs assistance Equipment used: Rolling walker (2 wheels) Transfers: Sit to/from Stand Sit to Stand: Min assist           General transfer comment: Cues for hand placement adn safety; good rise with most of weight on RLE; cues for hand placement adn control with stand to sit    Ambulation/Gait Ambulation/Gait assistance: Min assist (second person helpful for lines and chair follow) Gait Distance (Feet): 55 Feet Assistive device: Rolling walker (2 wheels) Gait Pattern/deviations: Step-through pattern (emerging)       General Gait Details: Cues for sequence, and safe rW positioning; Good use of bil UE support ot unweigh painful LLE in stance and allow for good swing RLE  Careers Information Officer  Tilt Bed    Modified Rankin (Stroke Patients Only)       Balance Overall balance assessment: History of Falls                                           Pertinent Vitals/Pain Pain Assessment Pain Descriptors / Indicators: Discomfort    Home Living Family/patient expects to be discharged to:: Private residence Living Arrangements:  Alone Available Help at Discharge: Family Type of Home: House Home Access: Stairs to enter Entrance Stairs-Rails: Left Entrance Stairs-Number of Steps: 1.5   Home Layout: One level Home Equipment: Grab bars - tub/shower;Rolling Walker (2 wheels);Rollator (4 wheels);Shower seat;BSC/3in1      Prior Function Prior Level of Function : Independent/Modified Independent;History of Falls (last six months)             Mobility Comments: using RW vs rollator ADLs Comments: manages ADLs, light IADLs; daughter assists with groceries and doctors appointments but cant assist a lot     Extremity/Trunk Assessment   Upper Extremity Assessment Upper Extremity Assessment: Defer to OT evaluation    Lower Extremity Assessment Lower Extremity Assessment: LLE deficits/detail LLE Deficits / Details: Soreness in groin and posterior aspect of knee adn thigh; Able to accept some weight onto LLE during RLE stepping       Communication   Communication Communication: Hearing impairment (hearing aides) Cueing Techniques: Verbal cues  Cognition Arousal: Alert Behavior During Therapy: WFL for tasks assessed/performed Overall Cognitive Status: Within Functional Limits for tasks assessed (for simple tasks)                                          General Comments General comments (skin integrity, edema, etc.): Session conducted on room air adn O2 sats remained greater than or equal to 90%    Exercises     Assessment/Plan    PT Assessment Patient needs continued PT services  PT Problem List Decreased strength;Decreased activity tolerance;Decreased balance;Decreased mobility;Decreased coordination;Decreased knowledge of use of DME;Decreased knowledge of precautions;Pain       PT Treatment Interventions DME instruction;Gait training;Stair training;Functional mobility training;Therapeutic activities;Therapeutic exercise;Balance training;Neuromuscular re-education;Cognitive  remediation;Patient/family education    PT Goals (Current goals can be found in the Care Plan section)  Acute Rehab PT Goals Patient Stated Goal: Wants to be safe to get home, would like to go to [facility] PT Goal Formulation: With patient Time For Goal Achievement: 02/02/23 Potential to Achieve Goals: Good    Frequency Min 1X/week     Co-evaluation               AM-PAC PT 6 Clicks Mobility  Outcome Measure Help needed turning from your back to your side while in a flat bed without using bedrails?: A Lot Help needed moving from lying on your back to sitting on the side of a flat bed without using bedrails?: A Lot Help needed moving to and from a bed to a chair (including a wheelchair)?: A Little Help needed standing up from a chair using your arms (e.g., wheelchair or bedside chair)?: A Little Help needed to walk in hospital room?: A Little Help needed climbing 3-5 steps with a railing? : Total 6 Click Score: 14    End of Session Equipment Utilized During Treatment: Gait belt Activity Tolerance:  Patient tolerated treatment well Patient left: in chair;with call bell/phone within reach;with chair alarm set Nurse Communication: Mobility status;Other (comment) (adn mobilizing is done if Ortho wants to re-image) PT Visit Diagnosis: Unsteadiness on feet (R26.81);History of falling (Z91.81);Pain Pain - Right/Left: Left Pain - part of body: Leg;Knee    Time: 8840-8779 PT Time Calculation (min) (ACUTE ONLY): 21 min   Charges:   PT Evaluation $PT Eval Moderate Complexity: 1 Mod   PT General Charges $$ ACUTE PT VISIT: 1 Visit         Silvano Currier, PT  Acute Rehabilitation Services Office [phone] Secure Chat welcomed   Silvano VEAR Currier 01/19/2023, 1:46 PM

## 2023-01-19 NOTE — Progress Notes (Signed)
 PHARMACY - ANTICOAGULATION CONSULT NOTE  Pharmacy Consult for warfarin Indication: atrial fibrillation  Allergies  Allergen Reactions   Amlodipine  Swelling   Cephalexin Other (See Comments)    unknown   Ciprofloxacin  Nausea And Vomiting   Codeine Other (See Comments)    unknown   Metoclopramide  Hcl Other (See Comments)    unknown   Nitrofuran Derivatives Other (See Comments)    unknown   Rosuvastatin     Other reaction(s): Myalgias    Patient Measurements: Height: 5' 3 (160 cm) Weight: 65 kg (143 lb 4.8 oz) IBW/kg (Calculated) : 52.4  Vital Signs: Temp: 98.1 F (36.7 C) (01/12 0200) Temp Source: Oral (01/12 0200) BP: 135/86 (01/12 0200) Pulse Rate: 75 (01/12 0200)  Labs: Recent Labs    01/18/23 0914 01/19/23 0452  HGB 13.6 11.8*  HCT 42.4 36.6  PLT 206 171  LABPROT 28.0* 28.0*  INR 2.6* 2.6*  CREATININE 0.72 0.72    Estimated Creatinine Clearance: 42.4 mL/min (by C-G formula based on SCr of 0.72 mg/dL).   Medical History: Past Medical History:  Diagnosis Date   Arthritis    right   Atrial fibrillation (HCC)    GERD (gastroesophageal reflux disease)    hx of   Hepatitis C    Humerus fracture    Hyperlipidemia    Hypertension    Irritable bowel syndrome    Meningioma (HCC)    PONV (postoperative nausea and vomiting)      Assessment: 89 YOF presenting with fall, hx of afib on warfarin PTA, INR on admission is therapeutic at 2.6, last dose taken on 1/10  PTA dosing: 3mg  daily, except 6mg  on Monday and Wed  01/19/23: INR remains therapeutic at 2.6. CBC remains stable today. No new bleeding noted, no PO intake charted, and now new DDIs identified. Will continue utilizing home regimen as INR is stable today.  Goal of Therapy:  INR 2-3 Monitor platelets by anticoagulation protocol: Yes   Plan:  Warfarin 3mg  PO x 1 today Daily INR Monitor oral intake, any s/sx of bleeding, new DDIs  Josefa Range, PharmD PGY1 Pharmacy Resident 01/19/2023 7:36  AM

## 2023-01-19 NOTE — Evaluation (Signed)
 Occupational Therapy Evaluation Patient Details Name: Gloria [name] MRN: [id number] DOB: Dec 12, 1930 Today's Date: 01/19/2023   History of Present Illness OLIWIA BERZINS is a 89 y.o. female who is admitted post a fall at her home, resulting in L superior and Inferior pubic rami fxs, non-displaced; will be managed nonop, WBAT; with past medical history of tobacco abuse, essential hypertension  hypertension, hyperlipidemia,   Clinical Impression   PTA patient independent with ADLs, light IADLs and mobility using RW vs rollator. Admitted for above and presents with problem list below.  Pt currenlty requires mod assist for bed mobility, min assist for transfers/functional mobility using RW, and up to mod assist for ADLs.  She lives alone and has intermittent assist from her daughters.  Based on performance today, believe patient will best benefit from continued OT services acutely and after dc at inpatient setting with <3hrs/day to optimize independence, safety with ADLs and mobility.         If plan is discharge home, recommend the following: A little help with walking and/or transfers;A lot of help with bathing/dressing/bathroom;Assistance with cooking/housework;Help with stairs or ramp for entrance;Assist for transportation    Functional Status Assessment  Patient has had a recent decline in their functional status and demonstrates the ability to make significant improvements in function in a reasonable and predictable amount of time.  Equipment Recommendations  Other (comment) (defer)    Recommendations for Other Services       Precautions / Restrictions Precautions Precautions: Fall Precaution Comments: Noting pt had been seen for vestibular dysfunction in the past Restrictions Weight Bearing Restrictions Per Provider Order: Yes LLE Weight Bearing Per Provider Order: Weight bearing as tolerated      Mobility Bed Mobility Overal bed mobility: Needs Assistance Bed Mobility:  Supine to Sit     Supine to sit: Mod assist     General bed mobility comments: Moves bil LEs towards the R edge of bed well, folowing cues for LE positioning; light mod assist to help reciprocally scoot hips to EOB and squre up and get feet to the floor    Transfers Overall transfer level: Needs assistance Equipment used: Rolling walker (2 wheels) Transfers: Sit to/from Stand Sit to Stand: Min assist           General transfer comment: Cues for hand placement adn safety; good rise with most of weight on RLE; cues for hand placement adn control with stand to sit      Balance Overall balance assessment: History of Falls, Needs assistance Sitting-balance support: No upper extremity supported, Feet supported Sitting balance-Leahy Scale: Fair     Standing balance support: Bilateral upper extremity supported, During functional activity, Reliant on assistive device for balance Standing balance-Leahy Scale: Poor                             ADL either performed or assessed with clinical judgement   ADL Overall ADL's : Needs assistance/impaired     Grooming: Set up;Sitting           Upper Body Dressing : Set up;Sitting   Lower Body Dressing: Moderate assistance;Sit to/from stand Lower Body Dressing Details (indicate cue type and reason): assist for socks, min assist to stand but relies on BUE support Toilet Transfer: Minimal assistance;Ambulation;Rolling walker (2 wheels)           Functional mobility during ADLs: Minimal assistance;Moderate assistance       Vision  Vision Assessment?: No apparent visual deficits     Perception         Praxis         Pertinent Vitals/Pain Pain Assessment Pain Assessment: 0-10 Pain Score: 4  Pain Location: L LE/hip Pain Descriptors / Indicators: Discomfort, Guarding Pain Intervention(s): Limited activity within patient's tolerance, Monitored during session, Repositioned     Extremity/Trunk Assessment  Upper Extremity Assessment Upper Extremity Assessment: Generalized weakness;RUE deficits/detail;Right hand dominant RUE Deficits / Details: R shoulder limited functional ROM due to prior surgery, but WFL elbow distal RUE Coordination: decreased gross motor   Lower Extremity Assessment Lower Extremity Assessment: Defer to PT evaluation LLE Deficits / Details: Soreness in groin and posterior aspect of knee adn thigh; Able to accept some weight onto LLE during RLE stepping       Communication Communication Communication: Hearing impairment (hearing aides) Cueing Techniques: Verbal cues   Cognition Arousal: Alert Behavior During Therapy: WFL for tasks assessed/performed Overall Cognitive Status: Within Functional Limits for tasks assessed (for simple tasks)                                 General Comments: pt oriented and following commands, appears WFL but not formally assessed     General Comments  SpO2 on RA stable, weaned from 4L upon entry    Exercises     Shoulder Instructions      Home Living Family/patient expects to be discharged to:: Private residence Living Arrangements: Alone Available Help at Discharge: Family Type of Home: House Home Access: Stairs to enter Secretary/administrator of Steps: 1.5 Entrance Stairs-Rails: Left Home Layout: One level     Bathroom Shower/Tub: Producer, Television/film/video: Handicapped height Bathroom Accessibility: Yes   Home Equipment: Grab bars - tub/shower;Rolling Environmental Consultant (2 wheels);Rollator (4 wheels);Shower seat;BSC/3in1          Prior Functioning/Environment Prior Level of Function : Independent/Modified Independent;History of Falls (last six months)             Mobility Comments: using RW vs rollator ADLs Comments: manages ADLs, light IADLs; daughter assists with groceries and doctors appointments but cant assist a lot        OT Problem List: Decreased strength;Decreased activity  tolerance;Impaired balance (sitting and/or standing);Decreased knowledge of use of DME or AE;Decreased knowledge of precautions;Pain      OT Treatment/Interventions: Therapeutic exercise;Self-care/ADL training;DME and/or AE instruction;Therapeutic activities;Patient/family education;Balance training    OT Goals(Current goals can be found in the care plan section) Acute Rehab OT Goals Patient Stated Goal: to rehab OT Goal Formulation: With patient Time For Goal Achievement: 02/02/23 Potential to Achieve Goals: Good  OT Frequency: Min 1X/week    Co-evaluation              AM-PAC OT 6 Clicks Daily Activity     Outcome Measure Help from another person eating meals?: None Help from another person taking care of personal grooming?: A Little Help from another person toileting, which includes using toliet, bedpan, or urinal?: A Lot Help from another person bathing (including washing, rinsing, drying)?: A Lot Help from another person to put on and taking off regular upper body clothing?: A Little Help from another person to put on and taking off regular lower body clothing?: A Lot 6 Click Score: 16   End of Session Equipment Utilized During Treatment: Gait belt;Rolling walker (2 wheels) Nurse Communication: Mobility status  Activity Tolerance: Patient  tolerated treatment well Patient left: in chair;with call bell/phone within reach;with chair alarm set  OT Visit Diagnosis: Other abnormalities of gait and mobility (R26.89);Muscle weakness (generalized) (M62.81);Pain;History of falling (Z91.81) Pain - Right/Left: Left Pain - part of body: Leg                Time: 8840-8773 OT Time Calculation (min): 27 min Charges:  OT General Charges $OT Visit: 1 Visit OT Evaluation $OT Eval Moderate Complexity: 1 Mod  Etta NOVAK, OT Acute Rehabilitation Services Office [phone]   Etta GORMAN Hope 01/19/2023, 3:01 PM

## 2023-01-19 NOTE — TOC CAGE-AID Note (Signed)
 Transition of Care [facility]) - CAGE-AID Screening  Patient Details  Name: Gloria [name] MRN: [id number] Date of Birth: 21-Jun-1930  Clinical Narrative:  Patient denies any current alcohol  or drug use, no need to provide substance abuse resources at this time.  CAGE-AID Screening:   Have You Ever Felt You Ought to Cut Down on Your Drinking or Drug Use?: No Have People Annoyed You By Critizing Your Drinking Or Drug Use?: No Have You Felt Bad Or Guilty About Your Drinking Or Drug Use?: No Have You Ever Had a Drink or Used Drugs First Thing In The Morning to Steady Your Nerves or to Get Rid of a Hangover?: No CAGE-AID Score: 0  Substance Abuse Education Offered: No

## 2023-01-19 NOTE — Progress Notes (Signed)
 Patient arrived to room. Alert and oriented x4. Pain addressed. Admission completed. Patient resting in bed with bed alarm on. Call bell within reach.

## 2023-01-19 NOTE — Plan of Care (Signed)
   Problem: Health Behavior/Discharge Planning: Goal: Ability to manage health-related needs will improve Outcome: Progressing   Problem: Clinical Measurements: Goal: Ability to maintain clinical measurements within normal limits will improve Outcome: Progressing

## 2023-01-19 NOTE — Progress Notes (Signed)
 PROGRESS NOTE  Gloria [name]  DOB: [birth year]  PCP: Yolande Toribio MATSU, MD FMW:[id number]  DOA: 01/18/2023  LOS: 1 day  Hospital Day: 2  Brief narrative: Gloria [name] is a 89 y.o. female with PMH significant for HTN, HLD, A-fib on Coumadin  12.1 mg, hep C, GERD, meningioma, IBS, osteoarthritis, s/p left THA 2007, left hip periprosthetic ORIF 2015,  left TKA 2019. 1/11, patient presented to the ED after a fall.  Patient was trying to get out of bed, she fell on her left side.  Did not strike her head, did not pass out.  No premonition symptoms.  In the ED, patient was hypertensive to 170s Labs were unremarkable, INR therapeutic at 2.6 CT scan of pelvis was obtained which showed 1. Acute fractures of the left superior and inferior pubic rami. 2. Chronic right pubic rami fractures, chronic left sacral ala fracture, chronic bilateral hip arthroplasty   EDP consulted with orthopedics Dr. Barton. Recommended nonoperative management  Admitted to [facility]  Subjective: Patient was seen and examined this morning.  Elderly Caucasian female.  Lying down in bed. Pain controlled at rest.  On 3 L oxygen  by nasal cannula.  Does not use at home. Chart reviewed.  Remains afebrile, hemoglobin stable, Repeat labs this morning unremarkable INR remains in therapeutic range  Assessment and plan: Acute on chronic fractures Imaging showed acute fracture of the left superior and inferior pubic rami.  Also showed chronic right pubic rami fractures, left sacral ala fracture. Orthopedics consulted. Nonoperative management recommended Recommended nonoperative management with pain meds, PT, allowed for WBAT with walker and two-person assist.  Also recommended to obtain AP pelvis films post mobilization with therapy. For pain control, start scheduled Tylenol , as needed oxycodone , as needed Robaxin , as needed IV Dilaudid  Bowel regimen with scheduled Senokot and MiraLAX  daily  Recurrent falls Likely  mechanical, loss of balance.  Denies premonitory symptoms at this time. On chart review, it is noted that patient had intermittent dizziness in the past, last seen by cardiology on October 2024.   In June 2022, patient had unremarkable MRI head, CT angio head and neck which showed generalized age-related atrophy, mild to moderate chronic vessel ischemic disease and a 1.4 cm right occipital meningioma. In 2023, she had long to monitor placed diet predominantly showed normal sinus rhythm, PVC burden 1%.    PAF (paroxysmal atrial fibrillation)  Currently in A.fib with prolong QTc of 504 with q waves in v1/v2. Continue metoprolol  Chronically anticoagulated on Coumadin .  INR therapeutic. I discussed with patient regarding the risk and benefit of Coumadin .  She states she does not fall frequently.  On a normal day, she is able to walk with a walker and she religiously uses a walker. Recent Labs  Lab 01/18/23 0914 01/19/23 0452  INR 2.6* 2.6*   Hypertension PTA meds- Toprol  25 mg daily, losartan  25 mg daily, hydralazine  25 mg 3 times daily Continue Toprol  and losartan . Continue monitor blood pressure.  Resume hydralazine  if needed.  HLD Lipitor  GERD PPI  Mobility: Encourage participation with PT  Goals of care   Code Status: Limited: Do not attempt resuscitation (DNR) -DNR-LIMITED -Do Not Intubate/DNI      DVT prophylaxis:  warfarin (COUMADIN ) tablet 3 mg   Antimicrobials: None Fluid: None Consultants: Orthopedics Family Communication: None at bedside  Status: Inpatient Level of care:  Telemetry Medical   Patient is from: Home alone Needs to continue in-hospital care: Pain management, disposition, SNF recommended by PT Anticipated d/c to: SNF  Diet:  Diet Order             Diet Heart Room service appropriate? Yes; Fluid consistency: Thin  Diet effective now                   Scheduled Meds:  acetaminophen   1,000 mg Oral TID   atorvastatin   20 mg Oral QHS    losartan   25 mg Oral Daily   metoprolol  succinate  25 mg Oral Daily   pantoprazole   40 mg Oral Daily   polyethylene glycol  17 g Oral Daily   warfarin  3 mg Oral ONCE-1600   Warfarin - Pharmacist Dosing Inpatient   Does not apply q1600    PRN meds: HYDROmorphone  (DILAUDID ) injection, methocarbamol  **OR** methocarbamol  (ROBAXIN ) injection, oxyCODONE    Infusions:     Antimicrobials: Anti-infectives (From admission, onward)    None       Objective: Vitals:   01/19/23 0200 01/19/23 1046  BP: 135/86 127/86  Pulse: 75 75  Resp: 20   Temp: 98.1 F (36.7 C) 98.5 F (36.9 C)  SpO2: 97% 96%    Intake/Output Summary (Last 24 hours) at 01/19/2023 1433 Last data filed at 01/19/2023 0324 Gross per 24 hour  Intake 636.05 ml  Output --  Net 636.05 ml   Filed Weights   01/18/23 0859  Weight: 65 kg   Weight change:  Body mass index is 25.38 kg/m.   Physical Exam: General exam: Pleasant, elderly.  Pain controlled Skin: No rashes, lesions or ulcers. HEENT: Atraumatic, normocephalic, no obvious bleeding Lungs: Clear to auscultation bilaterally,  CVS: S1, S2, no murmur,   GI/Abd: Soft, nontender, nondistended, bowel sound present,   CNS: Alert, awake, oriented x 3 Psychiatry: Mood appropriate,  Extremities: No pedal edema, no calf tenderness,   Data Review: I have personally reviewed the laboratory data and studies available.  F/u labs ordered Unresulted Labs (From admission, onward)     Start     Ordered   01/19/23 0500  Protime-INR  Daily,   R      01/18/23 1250   01/18/23 1229  Urinalysis, Routine w reflex microscopic -Urine, Random  Add-on,   AD       Question:  Specimen Source  Answer:  Urine, Random   01/18/23 1231   Unscheduled  CBC with Differential/Platelet  Tomorrow morning,   R        01/19/23 1433   Unscheduled  Basic metabolic panel  Tomorrow morning,   R        01/19/23 1433            Total time spent in review of labs and imaging, patient  evaluation, formulation of plan, documentation and communication with family: 55 minutes  Signed, Chapman Rota, MD [facility] 01/19/2023

## 2023-01-20 DIAGNOSIS — I10 Essential (primary) hypertension: Secondary | ICD-10-CM | POA: Diagnosis not present

## 2023-01-20 DIAGNOSIS — W19XXXA Unspecified fall, initial encounter: Secondary | ICD-10-CM | POA: Diagnosis not present

## 2023-01-20 DIAGNOSIS — Y92009 Unspecified place in unspecified non-institutional (private) residence as the place of occurrence of the external cause: Secondary | ICD-10-CM | POA: Diagnosis not present

## 2023-01-20 LAB — CBC WITH DIFFERENTIAL/PLATELET
Abs Immature Granulocytes: 0.02 10*3/uL (ref 0.00–0.07)
Basophils Absolute: 0 10*3/uL (ref 0.0–0.1)
Basophils Relative: 0 %
Eosinophils Absolute: 0.2 10*3/uL (ref 0.0–0.5)
Eosinophils Relative: 2 %
HCT: 38.4 % (ref 36.0–46.0)
Hemoglobin: 12.5 g/dL (ref 12.0–15.0)
Immature Granulocytes: 0 %
Lymphocytes Relative: 18 %
Lymphs Abs: 1.2 10*3/uL (ref 0.7–4.0)
MCH: 29.3 pg (ref 26.0–34.0)
MCHC: 32.6 g/dL (ref 30.0–36.0)
MCV: 89.9 fL (ref 80.0–100.0)
Monocytes Absolute: 0.7 10*3/uL (ref 0.1–1.0)
Monocytes Relative: 10 %
Neutro Abs: 4.8 10*3/uL (ref 1.7–7.7)
Neutrophils Relative %: 70 %
Platelets: 163 10*3/uL (ref 150–400)
RBC: 4.27 MIL/uL (ref 3.87–5.11)
RDW: 14.9 % (ref 11.5–15.5)
WBC: 6.9 10*3/uL (ref 4.0–10.5)
nRBC: 0 % (ref 0.0–0.2)

## 2023-01-20 LAB — BASIC METABOLIC PANEL
Anion gap: 9 (ref 5–15)
BUN: 13 mg/dL (ref 8–23)
CO2: 23 mmol/L (ref 22–32)
Calcium: 8.8 mg/dL — ABNORMAL LOW (ref 8.9–10.3)
Chloride: 106 mmol/L (ref 98–111)
Creatinine, Ser: 0.77 mg/dL (ref 0.44–1.00)
GFR, Estimated: >60 mL/min (ref >60)
Glucose, Bld: 101 mg/dL — ABNORMAL HIGH (ref 70–99)
Potassium: 3.5 mmol/L (ref 3.5–5.1)
Sodium: 138 mmol/L (ref 135–145)

## 2023-01-20 LAB — PROTIME-INR
INR: 2.6 — ABNORMAL HIGH (ref 0.8–1.2)
Prothrombin Time: 28 s — ABNORMAL HIGH (ref 11.4–15.2)

## 2023-01-20 MED ORDER — WARFARIN SODIUM 3 MG PO TABS
3.0000 mg | ORAL_TABLET | ORAL | Status: DC
  Filled 2023-01-20: qty 1

## 2023-01-20 MED ORDER — HYDRALAZINE HCL 25 MG PO TABS
25.0000 mg | ORAL_TABLET | Freq: Three times a day (TID) | ORAL | Status: DC
  Administered 2023-01-20 – 2023-01-21 (×3): 25 mg via ORAL
  Filled 2023-01-20 (×3): qty 1

## 2023-01-20 MED ORDER — ENSURE ENLIVE PO LIQD
237.0000 mL | Freq: Two times a day (BID) | ORAL | Status: DC
  Administered 2023-01-20 – 2023-01-21 (×3): 237 mL via ORAL

## 2023-01-20 MED ORDER — WARFARIN SODIUM 6 MG PO TABS
6.0000 mg | ORAL_TABLET | ORAL | Status: DC
Start: 2023-01-20 — End: 2023-01-21
  Administered 2023-01-20: 6 mg via ORAL
  Filled 2023-01-20: qty 1

## 2023-01-20 MED ORDER — ADULT MULTIVITAMIN W/MINERALS CH
1.0000 | ORAL_TABLET | Freq: Every day | ORAL | Status: DC
  Administered 2023-01-20 – 2023-01-21 (×2): 1 via ORAL
  Filled 2023-01-20 (×2): qty 1

## 2023-01-20 MED ORDER — ONDANSETRON HCL 4 MG/2ML IJ SOLN: 4.0000 mg | Freq: Four times a day (QID) | INTRAMUSCULAR | Status: DC | PRN

## 2023-01-20 NOTE — [place] FL2 (Signed)
 [place]  MEDICAID FL2 LEVEL OF CARE FORM     IDENTIFICATION  Patient Name: Gloria [name] Birthdate: [birth year] Sex: female Admission Date (Current Location): 01/18/2023  [facility] and [place] Number:  Producer, Television/film/video and Address:  [facility] [place], [address] [place], [place] [address]      Provider Number: [id number]  Attending Physician Name and Address:  Arlice Reichert, MD  Relative Name and Phone Number:  Rupert Cower Daughter [phone]  [phone]    Current Level of Care: Hospital Recommended Level of Care: Skilled Nursing Facility Prior Approval Number:    Date Approved/Denied:   PASRR Number: [id number]  Discharge Plan: SNF    Current Diagnoses: Patient Active Problem List   Diagnosis Date Noted   Fall at home, initial encounter 01/18/2023   Fracture of multiple pubic rami, left, closed, initial encounter (HCC) 01/18/2023   DNR (do not resuscitate) discussion 01/18/2023   Moderate tricuspid regurgitation - noted on previous Echo  09/26/2021   Moderate aortic valve stenosis - noted on previous Echo 09/26/2021   Carotid artery stenosis - moderate left carotid stenosis seen on previous Carotid Doppler 09/26/2021   Palpitations 09/26/2021   Pre-syncope  09/26/2021   BPPV (benign paroxysmal positional vertigo), left 06/17/2020   Hypertensive urgency 06/17/2020   Dehydration 06/17/2020   Bradycardia    Dizziness 06/16/2020   Sinus bradycardia 06/16/2020   History of COVID-19 06/16/2020   Long term (current) use of anticoagulants 02/10/2018   OA (osteoarthritis) of knee 11/24/2017   TIA (transient ischemic attack) 07/14/2016   Nonintractable headache    Chest pain 06/04/2016   SOB (shortness of breath) 06/24/2012   Hypokalemia 04/09/2011   Warfarin anticoagulation 04/09/2011   Hypertension    Irritable bowel syndrome    PAF (paroxysmal atrial fibrillation) (HCC)     Orientation RESPIRATION BLADDER Height & Weight      Self, Time, Situation, Place  Normal Continent, External catheter Weight: 143 lb 4.8 oz (65 kg) Height:  5' 3 (160 cm)  BEHAVIORAL SYMPTOMS/MOOD NEUROLOGICAL BOWEL NUTRITION STATUS      Continent Diet (see discharge summary)  AMBULATORY STATUS COMMUNICATION OF NEEDS Skin   Limited Assist Verbally Normal                       Personal Care Assistance Level of Assistance  Bathing, Feeding, Dressing Bathing Assistance: Limited assistance Feeding assistance: Limited assistance Dressing Assistance: Limited assistance     Functional Limitations Info  Sight, Hearing, Speech Sight Info: Adequate Hearing Info: Adequate Speech Info: Adequate    SPECIAL CARE FACTORS FREQUENCY  PT (By licensed PT), OT (By licensed OT)     PT Frequency: 5x week OT Frequency: 5x week            Contractures Contractures Info: Not present    Additional Factors Info  Code Status, Allergies Code Status Info: DNR Allergies Info: Amlodipine , Cephalexin, Ciprofloxacin , Codeine, Metoclopramide  Hcl, Nitrofuran Derivatives, Rosuvastatin           Current Medications (01/20/2023):  This is the current hospital active medication list Current Facility-Administered Medications  Medication Dose Route Frequency Provider Last Rate Last Admin   acetaminophen  (TYLENOL ) tablet 1,000 mg  1,000 mg Oral TID Dahal, Binaya, MD   1,000 mg at 01/20/23 0800   atorvastatin  (LIPITOR) tablet 20 mg  20 mg Oral QHS Patel, Ekta V, MD   20 mg at 01/19/23 2155   feeding supplement (ENSURE ENLIVE / ENSURE PLUS)  liquid 237 mL  237 mL Oral BID BM Dahal, Binaya, MD   237 mL at 01/20/23 1250   hydrALAZINE  (APRESOLINE ) tablet 25 mg  25 mg Oral TID Dahal, Binaya, MD       losartan  (COZAAR ) tablet 25 mg  25 mg Oral Daily Dahal, Binaya, MD   25 mg at 01/20/23 0800   methocarbamol  (ROBAXIN ) tablet 500 mg  500 mg Oral Q8H PRN Patel, Ekta V, MD   500 mg at 01/19/23 2155   Or   methocarbamol  (ROBAXIN ) injection 500 mg  500 mg  Intravenous Q12H PRN Patel, Ekta V, MD       metoprolol  succinate (TOPROL -XL) 24 hr tablet 25 mg  25 mg Oral Daily Dahal, Binaya, MD   25 mg at 01/20/23 0800   multivitamin with minerals tablet 1 tablet  1 tablet Oral Daily Dahal, Binaya, MD   1 tablet at 01/20/23 1314   ondansetron  (ZOFRAN ) injection 4 mg  4 mg Intravenous Q6H PRN Dahal, Chapman, MD       oxyCODONE  (Oxy IR/ROXICODONE ) immediate release tablet 5 mg  5 mg Oral Q6H PRN Dahal, Chapman, MD   5 mg at 01/19/23 2155   pantoprazole  (PROTONIX ) EC tablet 40 mg  40 mg Oral Daily Dahal, Binaya, MD   40 mg at 01/20/23 0800   polyethylene glycol (MIRALAX  / GLYCOLAX ) packet 17 g  17 g Oral Daily Dahal, Binaya, MD   17 g at 01/20/23 0800   [START ON 01/21/2023] warfarin (COUMADIN ) tablet 3 mg  3 mg Oral Once per day on Sunday Tuesday Thursday Friday Saturday Lola Massie LABOR, [facility]       warfarin (COUMADIN ) tablet 6 mg  6 mg Oral Once per day on Monday Wednesday Paytes, Massie LABOR, [facility]       Warfarin - Pharmacist Dosing Inpatient   Does not apply q1600 Gaines Carrier, [facility]   Given at 01/18/23 1722     Discharge Medications: Please see discharge summary for a list of discharge medications.  Relevant Imaging Results:  Relevant Lab Results:   Additional Information SSN: [id number]  Bridget Cordella Simmonds, LCSW

## 2023-01-20 NOTE — Progress Notes (Signed)
 PROGRESS NOTE  Gloria [name]  DOB: Apr 08, 1930  PCP: Yolande Toribio MATSU, MD FMW:[id number]  DOA: 01/18/2023  LOS: 2 days  Hospital Day: 3  Brief narrative: Gloria [name] is a 89 y.o. female with PMH significant for HTN, HLD, A-fib on Coumadin  12.1 mg, hep C, GERD, meningioma, IBS, osteoarthritis, s/p left THA 2007, left hip periprosthetic ORIF 2015,  left TKA 2019. 1/11, patient presented to the ED after a fall.  Patient was trying to get out of bed, she fell on her left side.  Did not strike her head, did not pass out.  No premonition symptoms.  In the ED, patient was hypertensive to 170s Labs were unremarkable, INR therapeutic at 2.6 CT scan of pelvis was obtained which showed 1. Acute fractures of the left superior and inferior pubic rami. 2. Chronic right pubic rami fractures, chronic left sacral ala fracture, chronic bilateral hip arthroplasty   EDP consulted with orthopedics Dr. Barton. Recommended nonoperative management  Admitted to [facility]  Subjective: Patient was seen and examined this morning.   Elderly Caucasian female.  Sitting up in recliner.  Was nauseated.  Vomited later in the morning.  She believes it is because of IV pain meds. Not on supplemental oxygen  today. Was able to walk with PT yesterday.  Assessment and plan: Acute on chronic fractures Imaging showed acute fracture of the left superior and inferior pubic rami.  Also showed chronic right pubic rami fractures, left sacral ala fracture. Orthopedics consulted. Nonoperative management recommended Recommended nonoperative management with pain meds, PT, allowed for WBAT with walker and two-person assist.   For pain control, start scheduled Tylenol , as needed oxycodone , as needed Robaxin .  Stop IV pain meds because of nausea vomiting. Bowel regimen with scheduled Senokot and MiraLAX  daily  Recurrent falls Likely mechanical, loss of balance.  Denies premonitory symptoms at this time. On chart review, it  is noted that patient had intermittent dizziness in the past, last seen by cardiology on October 2024.   In June 2022, patient had unremarkable MRI head, CT angio head and neck which showed generalized age-related atrophy, mild to moderate chronic vessel ischemic disease and a 1.4 cm right occipital meningioma. In 2023, she had long to monitor placed diet predominantly showed normal sinus rhythm, PVC burden 1%.    PAF (paroxysmal atrial fibrillation)  Currently in A.fib with prolong QTc of 504 with q waves in v1/v2. Continue metoprolol  Chronically anticoagulated on Coumadin .  INR therapeutic. I discussed with patient regarding the risk and benefit of Coumadin .  She states she does not fall frequently.  On a normal day, she is able to walk with a walker and she religiously uses a walker. Recent Labs  Lab 01/18/23 0914 01/19/23 0452 01/20/23 0446  INR 2.6* 2.6* 2.6*   Hypertension PTA meds- Toprol  25 mg daily, losartan  25 mg daily, hydralazine  25 mg 3 times daily Currently continued on Toprol  and losartan .  Blood pressure running elevated.  Resume hydralazine .  HLD Lipitor  GERD PPI  Mobility: Encourage participation with PT  Goals of care   Code Status: Limited: Do not attempt resuscitation (DNR) -DNR-LIMITED -Do Not Intubate/DNI      DVT prophylaxis:  warfarin (COUMADIN ) tablet 6 mg  warfarin (COUMADIN ) tablet 3 mg   Antimicrobials: None Fluid: None Consultants: Orthopedics Family Communication: None at bedside  Status: Inpatient Level of care:  Telemetry Medical   Patient is from: Home alone Needs to continue in-hospital care: Pain management, disposition, SNF recommended by PT Anticipated d/c  to: SNF   Diet:  Diet Order             Diet Heart Room service appropriate? Yes; Fluid consistency: Thin  Diet effective now                   Scheduled Meds:  acetaminophen   1,000 mg Oral TID   atorvastatin   20 mg Oral QHS   feeding supplement  237 mL Oral BID  BM   hydrALAZINE   25 mg Oral TID   losartan   25 mg Oral Daily   metoprolol  succinate  25 mg Oral Daily   multivitamin with minerals  1 tablet Oral Daily   pantoprazole   40 mg Oral Daily   polyethylene glycol  17 g Oral Daily   [START ON 01/21/2023] warfarin  3 mg Oral Once per day on Sunday Tuesday Thursday Friday Saturday   warfarin  6 mg Oral Once per day on Monday Wednesday   Warfarin - Pharmacist Dosing Inpatient   Does not apply q1600    PRN meds: methocarbamol **OR** methocarbamol (ROBAXIN) injection, ondansetron (ZOFRAN) IV, oxyCODONE   Infusions:     Antimicrobials: Anti-infectives (From admission, onward)    None       Objective: Vitals:   01/20/23 0434 01/20/23 0914  BP: (!) 153/85 (!) 147/99  Pulse: 95   Resp:    Temp: 98 F (36.7 C) 98.3 F (36.8 C)  SpO2: 94% 96%    Intake/Output Summary (Last 24 hours) at 01/20/2023 1338 Last data filed at 01/19/2023 1517 Gross per 24 hour  Intake 301.36 ml  Output --  Net 301.36 ml   Filed Weights   01/18/23 0859  Weight: 65 kg   Weight change:  Body mass index is 25.38 kg/m.   Physical Exam: General exam: Pleasant, elderly.  Pain controlled.  Nauseated Skin: No rashes, lesions or ulcers. HEENT: Atraumatic, normocephalic, no obvious bleeding Lungs: Clear to auscultation bilaterally,  CVS: S1, S2, no murmur,   GI/Abd: Soft, nontender, nondistended, bowel sound present,   CNS: Alert, awake, oriented x 3 Psychiatry: Mood appropriate,  Extremities: No pedal edema, no calf tenderness,   Data Review: I have personally reviewed the laboratory data and studies available.  F/u labs ordered Unresulted Labs (From admission, onward)     Start     Ordered   01/19/23 0500  Protime-INR  Daily,   R      01 /11/25 1250   01/18/23 1229  Urinalysis, Routine w reflex microscopic -Urine, Random  Add-on,   AD       Question:  Specimen Source  Answer:  Urine, Random   01/18/23 1231            Total time spent in  review of labs and imaging, patient evaluation, formulation of plan, documentation and communication with family: 45 minutes  Signed, Chapman Rota, MD [facility] 01/20/2023

## 2023-01-20 NOTE — Progress Notes (Signed)
 PHARMACY - ANTICOAGULATION CONSULT NOTE  Pharmacy Consult for warfarin Indication: atrial fibrillation  Allergies  Allergen Reactions   Amlodipine  Swelling   Cephalexin Other (See Comments)    unknown   Ciprofloxacin  Nausea And Vomiting   Codeine Other (See Comments)    unknown   Metoclopramide  Hcl Other (See Comments)    unknown   Nitrofuran Derivatives Other (See Comments)    unknown   Rosuvastatin     Other reaction(s): Myalgias    Patient Measurements: Height: 5' 3 (160 cm) Weight: 65 kg (143 lb 4.8 oz) IBW/kg (Calculated) : 52.4  Vital Signs: Temp: 98 F (36.7 C) (01/13 0434) Temp Source: Oral (01/13 0434) BP: 153/85 (01/13 0434) Pulse Rate: 95 (01/13 0434)  Labs: Recent Labs    01/18/23 0914 01/19/23 0452 01/20/23 0446  HGB 13.6 11.8* 12.5  HCT 42.4 36.6 38.4  PLT 206 171 163  LABPROT 28.0* 28.0* 28.0*  INR 2.6* 2.6* 2.6*  CREATININE 0.72 0.72 0.77    Estimated Creatinine Clearance: 42.4 mL/min (by C-G formula based on SCr of 0.77 mg/dL).   Medical History: Past Medical History:  Diagnosis Date   Arthritis    right   Atrial fibrillation (HCC)    GERD (gastroesophageal reflux disease)    hx of   Hepatitis C    Humerus fracture    Hyperlipidemia    Hypertension    Irritable bowel syndrome    Meningioma (HCC)    PONV (postoperative nausea and vomiting)      Assessment: 89 YOF presenting with fall, hx of afib on warfarin PTA, INR on admission is therapeutic at 2.6, last dose taken on 1/10  PTA dosing: 3mg  daily, except 6mg  on Monday and Wed  01/20/23: INR remains therapeutic at 2.6 on PTA warfarin regimen. CBC remains stable today. No new bleeding noted, no PO intake charted, and now new DDIs identified. Will continue utilizing home regimen as INR is stable today.  Goal of Therapy:  INR 2-3 Monitor platelets by anticoagulation protocol: Yes   Plan:  Warfarin 6mg  PO today- dosing per PTA regimen entered  Daily INR Monitor oral intake,  any s/sx of bleeding, new DDIs  Massie Fila, PharmD Clinical Pharmacist  01/20/2023 7:43 AM \

## 2023-01-20 NOTE — Progress Notes (Signed)
 Initial Nutrition Assessment  DOCUMENTATION CODES:   Not applicable  INTERVENTION:   Ensure Enlive po BID, each supplement provides 350 kcal and 20 grams of protein. Magic cup TID with meals, each supplement provides 290 kcal and 9 grams of protein MVI with minerals daily  Discussed high calorie, high protein options for healing   NUTRITION DIAGNOSIS:   Increased nutrient needs related to hip fracture as evidenced by estimated needs.  GOAL:   Patient will meet greater than or equal to 90% of their needs  MONITOR:   PO intake, Supplement acceptance  REASON FOR ASSESSMENT:   Consult Hip fracture protocol  ASSESSMENT:  89 y.o Female with PMH of tobacco abuse, HTN, HLD, GERD, A-fib, HEP C, GERD, IBS, meningioma, ostearthritis. S/P left THA 2007, left hip periprosthetic ORIF 2015,  left TKA 2019. Admitted for fall at home after getting out out of bed. Found to have pubic rami and left sacral ala fractures: Non-op management.  Pt lives at home is able to feed and cook for herself. She reports she has had multiple falls in the past. She states having a good appetite and PO intake with eating 3 meals/day. She used to drink Ensures but has not recently. She does drink Pedialyte at home. Typically she would have eggs and bacon or oatmeal and fruit for breakfast. A chicken or turkey sandwich with chips for lunch and a frozen dinner or home cooked meal brought in by her family for dinner.   She does not endorse any weight loss and reports her UBW to be around 140 lbs.   Pt had 90% of her breakfast this morning. Discussed with pt the importance of protein and calories during healing. Discussed high protein meal options on menu. Pt agreeable to drinking Ensures.   Admit weight: 65 kg  Current weight: 65 kg    Weight History: 01/18/23 65 kg  10/11/22 65 kg  03/25/22 63.1 kg  02/21/22 64.5 kg  09/26/21 64.5 kg  07/04/21 64.3 kg  03/27/21 66.7 kg   Average Meal Intake: No meals  recorded   Nutritionally Relevant Medications: Scheduled Meds: 707 metoprolol  succinate  25 mg Oral Daily   multivitamin with minerals  1 tablet Oral Daily   pantoprazole   40 mg Oral Daily   polyethylene glycol  17 g Oral Daily   [START ON 01/21/2023] warfarin  3 mg Oral Once per day on Sunday Tuesday Thursday Friday Saturday   warfarin  6 mg Oral Once per day on Monday Wednesday   Warfarin - Pharmacist Dosing Inpatient   Does not apply q1600   Labs Reviewed: 1/13: Calcium  8.8,   NUTRITION - FOCUSED PHYSICAL EXAM:  Flowsheet Row Most Recent Value  Orbital Region Mild depletion  Upper Arm Region No depletion  Thoracic and Lumbar Region Mild depletion  Buccal Region No depletion  Temple Region Mild depletion  Clavicle Bone Region Mild depletion  Clavicle and Acromion Bone Region Mild depletion  Scapular Bone Region Mild depletion  Dorsal Hand Mild depletion  Patellar Region Unable to assess  [Positioning]  Anterior Thigh Region Unable to assess  [Positioning]  Posterior Calf Region Unable to assess  [Positioning]  Edema (RD Assessment) None  Hair Reviewed  Eyes Reviewed  Mouth Reviewed  Skin Reviewed  Nails Reviewed       Diet Order:   Diet Order             Diet Heart Room service appropriate? Yes; Fluid consistency: Thin  Diet effective now  EDUCATION NEEDS:   Education needs have been addressed  Skin:  Skin Assessment: Reviewed RN Assessment  Last BM:  01/17/23  Height:   Ht Readings from Last 1 Encounters:  01/18/23 5' 3 (1.6 m)    Weight:   Wt Readings from Last 1 Encounters:  01/18/23 65 kg    Ideal Body Weight:  52.27 kg  BMI:  Body mass index is 25.38 kg/m.  Estimated Nutritional Needs:   Kcal:  1400-1600 kcal  Protein:  80-100 gm  Fluid:  >1.4L  Olivia Kenning, RD Registered Dietitian  See Amion for more information

## 2023-01-20 NOTE — TOC Initial Note (Signed)
 Transition of Care [facility]) - Initial/Assessment Note    Patient Details  Name: Gloria [name] MRN: [id number] Date of Birth: Apr 24, 1930  [facility]) CM/SW Contact:    Bridget Cordella Simmonds, LCSW Phone Number: 01/20/2023, 2:46 PM  Clinical Narrative:     CSW met with pt regarding PT recommendation for SNF.  Pt agreeable to SNF, has been to [facility] before and is requesting to return there.  Permission given to send referral to [facility].  Permission given to speak with daughters [name] and Dorothe.  Pt from home alone, no current services.  Referral sent to [facility], CSW reached out to [facility] to review.                Expected Discharge Plan: Skilled Nursing Facility Barriers to Discharge: Continued Medical Work up, SNF Pending bed offer   Patient Goals and CMS Choice Patient states their goals for this hospitalization and ongoing recovery are:: get back home   Choice offered to / list presented to : Patient (requesting [facility])      Expected Discharge Plan and Services In-house Referral: Clinical Social Work   Post Acute Care Choice: Skilled Nursing Facility Living arrangements for the past 2 months: Single Family Home                                      Prior Living Arrangements/Services Living arrangements for the past 2 months: Single Family Home Lives with:: Self Patient language and need for interpreter reviewed:: Yes Do you feel safe going back to the place where you live?: Yes      Need for Family Participation in Patient Care: Yes (Comment) Care giver support system in place?: Yes (comment) Current home services: Other (comment) (none) Criminal Activity/Legal Involvement Pertinent to Current Situation/Hospitalization: No - Comment as needed  Activities of Daily Living   ADL Screening (condition at time of admission) Independently performs ADLs?: No Does the patient have a NEW difficulty with bathing/dressing/toileting/self-feeding that is  expected to last >3 days?: Yes (Initiates electronic notice to provider for possible OT consult) Does the patient have a NEW difficulty with getting in/out of bed, walking, or climbing stairs that is expected to last >3 days?: Yes (Initiates electronic notice to provider for possible PT consult) Does the patient have a NEW difficulty with communication that is expected to last >3 days?: No Is the patient deaf or have difficulty hearing?: No Does the patient have difficulty seeing, even when wearing glasses/contacts?: Yes Does the patient have difficulty concentrating, remembering, or making decisions?: No  Permission Sought/Granted Permission sought to share information with : Family Supports Permission granted to share information with : Yes, Verbal Permission Granted  Share Information with NAME: daughters [name] and Dorothe  Permission granted to share info w AGENCY: SNF        Emotional Assessment Appearance:: Appears stated age Attitude/Demeanor/Rapport: Engaged Affect (typically observed): Appropriate, Pleasant Orientation: : Oriented to Self, Oriented to Place, Oriented to  Time, Oriented to Situation      Admission diagnosis:  Fall at home, initial encounter [W19.XXXA, Y92.009] Multiple closed fractures of pelvis without disruption of pelvic ring, initial encounter [facility]) [S32.82XA] Patient Active Problem List   Diagnosis Date Noted   Fall at home, initial encounter 01/18/2023   Fracture of multiple pubic rami, left, closed, initial encounter (HCC) 01/18/2023   DNR (do not resuscitate) discussion 01/18/2023   Moderate  tricuspid regurgitation - noted on previous Echo  09/26/2021   Moderate aortic valve stenosis - noted on previous Echo 09/26/2021   Carotid artery stenosis - moderate left carotid stenosis seen on previous Carotid Doppler 09/26/2021   Palpitations 09/26/2021   Pre-syncope  09/26/2021   BPPV (benign paroxysmal positional vertigo), left 06/17/2020   Hypertensive  urgency 06/17/2020   Dehydration 06/17/2020   Bradycardia    Dizziness 06/16/2020   Sinus bradycardia 06/16/2020   History of COVID-19 06/16/2020   Long term (current) use of anticoagulants 02/10/2018   OA (osteoarthritis) of knee 11/24/2017   TIA (transient ischemic attack) 07/14/2016   Nonintractable headache    Chest pain 06/04/2016   SOB (shortness of breath) 06/24/2012   Hypokalemia 04/09/2011    Class: Acute   Warfarin anticoagulation 04/09/2011    Class: Chronic   Hypertension    Irritable bowel syndrome    PAF (paroxysmal atrial fibrillation) (HCC)    PCP:  Yolande Toribio MATSU, MD Pharmacy:   [id number] [address] [place]), [place] [address] [place] ([place]) [place] [address] Phone: [phone] Fax: [phone]     Social Drivers of Health (SDOH) Social History: SDOH Screenings   Food Insecurity: No Food Insecurity (01/18/2023)  Housing: Low Risk  (01/18/2023)  Transportation Needs: No Transportation Needs (01/18/2023)  Utilities: Not At Risk (01/19/2023)  Social Connections: Moderately Integrated (01/18/2023)  Tobacco Use: Medium Risk (01/18/2023)   SDOH Interventions:     Readmission Risk Interventions     No data to display

## 2023-01-21 DIAGNOSIS — W19XXXA Unspecified fall, initial encounter: Secondary | ICD-10-CM | POA: Diagnosis not present

## 2023-01-21 DIAGNOSIS — Y92009 Unspecified place in unspecified non-institutional (private) residence as the place of occurrence of the external cause: Secondary | ICD-10-CM | POA: Diagnosis not present

## 2023-01-21 DIAGNOSIS — I10 Essential (primary) hypertension: Secondary | ICD-10-CM | POA: Diagnosis not present

## 2023-01-21 LAB — PROTIME-INR
INR: 2.7 — ABNORMAL HIGH (ref 0.8–1.2)
Prothrombin Time: 28.8 s — ABNORMAL HIGH (ref 11.4–15.2)

## 2023-01-21 MED ORDER — METOPROLOL SUCCINATE ER 25 MG PO TB24: 25.0000 mg | ORAL_TABLET | Freq: Every day | ORAL | Status: DC

## 2023-01-21 MED ORDER — OXYCODONE HCL 5 MG PO TABS: 5.0000 mg | ORAL_TABLET | Freq: Four times a day (QID) | ORAL | 0 refills | Status: AC | PRN

## 2023-01-21 MED ORDER — ENSURE ENLIVE PO LIQD: 237.0000 mL | Freq: Two times a day (BID) | ORAL | Status: AC

## 2023-01-21 MED ORDER — METHOCARBAMOL 500 MG PO TABS: 500.0000 mg | ORAL_TABLET | Freq: Three times a day (TID) | ORAL | Status: DC | PRN

## 2023-01-21 NOTE — Progress Notes (Signed)
 Physical Therapy Treatment Patient Details Name: Gloria [name] MRN: [id number] DOB: 01/02/1931 Today's Date: 01/21/2023   History of Present Illness Gloria [name] is a 89 y.o. female who is admitted post a fall at her home, resulting in L superior and Inferior pubic rami fxs, non-displaced; will be managed nonop, WBAT; with past medical history of tobacco abuse, essential hypertension  hypertension, hyperlipidemia,    PT Comments  Pt ambulated in hallway then assisted to/from [facility]. Pt required mod assist bed mobility, min assist sit to stand, and min assist amb 10' with RW. Slow, antalgic gait pattern. Effortful. Fair sitting balance and poor standing balance. Pt in recliner at end of session. Current POC remains appropriate.   If plan is discharge home, recommend the following: A lot of help with walking and/or transfers;A lot of help with bathing/dressing/bathroom;Assistance with cooking/housework;Help with stairs or ramp for entrance;Assist for transportation   Can travel by private vehicle     Yes  Equipment Recommendations  None recommended by PT    Recommendations for Other Services       Precautions / Restrictions Precautions Precautions: Fall Restrictions Weight Bearing Restrictions Per Provider Order: Yes LLE Weight Bearing Per Provider Order: Weight bearing as tolerated     Mobility  Bed Mobility Overal bed mobility: Needs Assistance Bed Mobility: Supine to Sit     Supine to sit: Mod assist, HOB elevated, Used rails     General bed mobility comments: Assist with LLE and trunk, increased time    Transfers Overall transfer level: Needs assistance Equipment used: Rolling walker (2 wheels) Transfers: Sit to/from Stand Sit to Stand: Min assist           General transfer comment: cues for hand placement and sequencing, increased time to stabilize initial balance    Ambulation/Gait Ambulation/Gait assistance: Min assist Gait Distance (Feet): 70  Feet Assistive device: Rolling walker (2 wheels) Gait Pattern/deviations: Step-through pattern, Shuffle, Antalgic, Decreased weight shift to left Gait velocity: decreased Gait velocity interpretation: <1.8 ft/sec, indicate of risk for recurrent falls   General Gait Details: distance limited by fatigue and pain. HR into 100s   Stairs             Wheelchair Mobility     Tilt Bed    Modified Rankin (Stroke Patients Only)       Balance Overall balance assessment: History of Falls, Needs assistance Sitting-balance support: No upper extremity supported, Feet supported Sitting balance-Leahy Scale: Fair     Standing balance support: Bilateral upper extremity supported, During functional activity, Reliant on assistive device for balance Standing balance-Leahy Scale: Poor                              Cognition Arousal: Alert Behavior During Therapy: WFL for tasks assessed/performed Overall Cognitive Status: Within Functional Limits for tasks assessed                                          Exercises      General Comments General comments (skin integrity, edema, etc.): VSS on RA      Pertinent Vitals/Pain Pain Assessment Pain Assessment: Faces Faces Pain Scale: Hurts little more Pain Location: L LE/hip Pain Descriptors / Indicators: Discomfort, Guarding Pain Intervention(s): Monitored during session, Limited activity within patient's tolerance, Repositioned    Home Living  Prior Function            PT Goals (current goals can now be found in the care plan section) Acute Rehab PT Goals Patient Stated Goal: Wants to be safe to get home, would like to go to [place] Progress towards PT goals: Progressing toward goals    Frequency    Min 1X/week      PT Plan      Co-evaluation              AM-PAC PT 6 Clicks Mobility   Outcome Measure  Help needed turning from your back to your  side while in a flat bed without using bedrails?: A Lot Help needed moving from lying on your back to sitting on the side of a flat bed without using bedrails?: A Lot Help needed moving to and from a bed to a chair (including a wheelchair)?: A Little Help needed standing up from a chair using your arms (e.g., wheelchair or bedside chair)?: A Little Help needed to walk in hospital room?: A Little Help needed climbing 3-5 steps with a railing? : Total 6 Click Score: 14    End of Session Equipment Utilized During Treatment: Gait belt Activity Tolerance: Patient tolerated treatment well Patient left: in chair;with call bell/phone within reach;with chair alarm set Nurse Communication: Mobility status PT Visit Diagnosis: Unsteadiness on feet (R26.81);History of falling (Z91.81);Pain Pain - Right/Left: Left Pain - part of body: Leg     Time: 9184-9165 PT Time Calculation (min) (ACUTE ONLY): 19 min  Charges:    $Gait Training: 8-22 mins PT General Charges $$ ACUTE PT VISIT: 1 Visit                     Sari MATSU., PT  Office # [phone]    Erven Sari Shaker 01/21/2023, 8:43 AM

## 2023-01-21 NOTE — Progress Notes (Signed)
 PHARMACY - ANTICOAGULATION CONSULT NOTE  Pharmacy Consult for warfarin Indication: atrial fibrillation  Allergies  Allergen Reactions   Amlodipine  Swelling   Cephalexin Other (See Comments)    unknown   Ciprofloxacin  Nausea And Vomiting   Codeine Other (See Comments)    unknown   Metoclopramide  Hcl Other (See Comments)    unknown   Nitrofuran Derivatives Other (See Comments)    unknown   Rosuvastatin     Other reaction(s): Myalgias    Patient Measurements: Height: 5' 3 (160 cm) Weight: 65 kg (143 lb 4.8 oz) IBW/kg (Calculated) : 52.4  Vital Signs: Temp: 98.9 F (37.2 C) (01/14 0709) Temp Source: Oral (01/14 0709) BP: 136/96 (01/14 0709) Pulse Rate: 79 (01/14 0709)  Labs: Recent Labs    01/18/23 0914 01/19/23 0452 01/20/23 0446 01/21/23 0506  HGB 13.6 11.8* 12.5  --   HCT 42.4 36.6 38.4  --   PLT 206 171 163  --   LABPROT 28.0* 28.0* 28.0* 28.8*  INR 2.6* 2.6* 2.6* 2.7*  CREATININE 0.72 0.72 0.77  --     Estimated Creatinine Clearance: 42.4 mL/min (by C-G formula based on SCr of 0.77 mg/dL).   Medical History: Past Medical History:  Diagnosis Date   Arthritis    right   Atrial fibrillation (HCC)    GERD (gastroesophageal reflux disease)    hx of   Hepatitis C    Humerus fracture    Hyperlipidemia    Hypertension    Irritable bowel syndrome    Meningioma (HCC)    PONV (postoperative nausea and vomiting)      Assessment: 89 YOF presenting with fall, hx of afib on warfarin PTA, INR on admission is therapeutic at 2.6, last dose taken on 1/10  PTA dosing: 3mg  daily, except 6mg  on Monday and Wed  01/21/23: INR remains therapeutic at 2.7- stable, on PTA warfarin regimen. No new bleeding noted, no PO intake charted, and now new DDIs identified. Will continue utilizing home regimen as INR is stable today.  Goal of Therapy:  INR 2-3 Monitor platelets by anticoagulation protocol: Yes   Plan:  Warfarin 6mg  PO today- dosing per PTA regimen entered   Daily INR, CBC In AM Monitor oral intake, any s/sx of bleeding, new DDIs  Massie Fila, PharmD Clinical Pharmacist  01/21/2023 7:42 AM \

## 2023-01-21 NOTE — Discharge Summary (Addendum)
 Physician Discharge Summary  Gloria [name] DOB: 1930/01/24 DOA: 01/18/2023  PCP: Yolande Toribio MATSU, MD  Admit date: 01/18/2023 Discharge date: 01/21/2023  Admitted From: Home Discharge disposition: SNF   Brief narrative: Gloria [name] is a 89 y.o. female with PMH significant for HTN, HLD, A-fib on Coumadin  12.1 mg, hep C, GERD, meningioma, IBS, osteoarthritis, s/p left THA 2007, left hip periprosthetic ORIF 2015,  left TKA 2019. 1/11, patient presented to the ED after a fall.  Patient was trying to get out of bed, she fell on her left side.  Did not strike her head, did not pass out.  No premonition symptoms.  In the ED, patient was hypertensive to 170s Labs were unremarkable, INR therapeutic at 2.6 CT scan of pelvis was obtained which showed 1. Acute fractures of the left superior and inferior pubic rami. 2. Chronic right pubic rami fractures, chronic left sacral ala fracture, chronic bilateral hip arthroplasty   EDP consulted with orthopedics Dr. Barton. Recommended nonoperative management  Admitted to [facility] for pain management and PT eval  Subjective: Patient was seen and examined this morning.   Elderly Caucasian female.  Sitting up in recliner.  Nausea vomiting improved.  Last emesis was yesterday morning.   She believes it is because of IV pain meds. Not on supplemental oxygen  today. Per caseworker, SNF able to take her today.  Hospital course: Acute on chronic fractures Imaging showed acute fracture of the left superior and inferior pubic rami.  Also showed chronic right pubic rami fractures, left sacral ala fracture. Orthopedics recommended nonoperative management with pain control, PT eval, allowed for WBAT with walker and two-person assist.   For pain control, start scheduled Tylenol , as needed oxycodone , as needed Robaxin . Bowel regimen with scheduled Senokot and MiraLAX  daily PT eval obtained.  SNF recommended  ?  Frequent falls Likely  mechanical, loss of balance.  Denies premonitory symptoms at this time. On chart review, it is noted that patient had intermittent dizziness in the past, last seen by cardiology on October 2024.   In June 2022, patient had unremarkable MRI head, CT angio head and neck which showed generalized age-related atrophy, mild to moderate chronic vessel ischemic disease and a 1.4 cm right occipital meningioma. In 2023, she had long to monitor placed diet predominantly showed normal sinus rhythm, PVC burden 1%.    PAF (paroxysmal atrial fibrillation)  Currently in A.fib with prolong QTc of 504 with q waves in v1/v2. Continue metoprolol  Chronically anticoagulated on Coumadin .  INR therapeutic. I discussed with patient regarding the risk and benefit of Coumadin .  She states she does not fall frequently.  On a normal day, she is able to walk with a walker and she religiously uses a walker. She plans to continue Coumadin  for now and revisit later if she starts falling frequently. Recent Labs  Lab 01/18/23 0914 01/19/23 0452 01/20/23 0446 01/21/23 0506  INR 2.6* 2.6* 2.6* 2.7*   Hypertension PTA meds- Toprol  25 mg daily, losartan  25 mg daily, hydralazine  25 mg 3 times daily Continue all.  HLD Lipitor  GERD PPI  Goals of care   Code Status: Limited: Do not attempt resuscitation (DNR) -DNR-LIMITED -Do Not Intubate/DNI    Diet:  Diet Order             Diet general           Diet Heart Room service appropriate? Yes; Fluid consistency: Thin  Diet effective now  Nutritional status:  Body mass index is 25.38 kg/m.  Nutrition Problem: Increased nutrient needs Etiology: hip fracture Signs/Symptoms: estimated needs  Wounds:  -    Discharge Exam:   Vitals:   01/20/23 1425 01/20/23 2037 01/21/23 0355 01/21/23 0709  BP: (!) 145/87 138/86 (!) 138/91 (!) 136/96  Pulse: 89  92 79  Resp:  18  18  Temp: 98.5 F (36.9 C)  98.2 F (36.8 C) 98.9 F (37.2 C)  TempSrc:  Oral  Oral Oral  SpO2: 98% 98% 96% 98%  Weight:      Height:        Body mass index is 25.38 kg/m.   General exam: Pleasant, elderly.  Pain controlled.   Skin: No rashes, lesions or ulcers. HEENT: Atraumatic, normocephalic, no obvious bleeding Lungs: Clear to auscultation bilaterally,  CVS: S1, S2, no murmur,   GI/Abd: Soft, nontender, nondistended, bowel sound present,   CNS: Alert, awake, oriented x 3 Psychiatry: Mood appropriate,  Extremities: No pedal edema, no calf tenderness,   Follow ups:    Contact information for follow-up providers     Yolande Toribio MATSU, MD Follow up.   Specialty: Internal Medicine Contact information: [address] [place] [address] [phone]              Contact information for after-discharge care     Destination     [facility] [place] Preferred SNF .   Service: Skilled Nursing Contact information: [address] [place]  [phone]                     Discharge Instructions:   Discharge Instructions     Call MD for:  difficulty breathing, headache or visual disturbances   Complete by: As directed    Call MD for:  extreme fatigue   Complete by: As directed    Call MD for:  hives   Complete by: As directed    Call MD for:  persistant dizziness or light-headedness   Complete by: As directed    Call MD for:  persistant nausea and vomiting   Complete by: As directed    Call MD for:  severe uncontrolled pain   Complete by: As directed    Call MD for:  temperature >100.4   Complete by: As directed    Diet general   Complete by: As directed    Discharge instructions   Complete by: As directed    PDMP reviewed this encounter.   Opioid taper instructions: It is important to wean off of your opioid medication as soon as possible. If you do not need pain medication after your surgery it is ok to stop day one. Opioids include: Codeine, Hydrocodone (Norco,  Vicodin), Oxycodone (Percocet, oxycontin ) and hydromorphone  amongst others.  Long term and even short term use of opiods can cause: Increased pain response Dependence Constipation Depression Respiratory depression And more.  Withdrawal symptoms can include Flu like symptoms Nausea, vomiting And more Techniques to manage these symptoms Hydrate well Eat regular healthy meals Stay active Use relaxation techniques(deep breathing, meditating, yoga) Do Not substitute Alcohol  to help with tapering If you have been on opioids for less than two weeks and do not have pain than it is ok to stop all together.  Plan to wean off of opioids This plan should start within one week post op of your joint replacement. Maintain the same interval or time between taking each dose and first decrease the dose.  Cut  the total daily intake of opioids by one tablet each day Next start to increase the time between doses. The last dose that should be eliminated is the evening dose.        General discharge instructions: Follow with Primary MD Yolande Toribio MATSU, MD in 7 days  Please request your PCP  to go over your hospital tests, procedures, radiology results at the follow up. Please get your medicines reviewed and adjusted.  Your PCP may decide to repeat certain labs or tests as needed. Do not drive, operate heavy machinery, perform activities at heights, swimming or participation in water activities or provide baby sitting services if your were admitted for syncope or siezures until you have seen by Primary MD or a Neurologist and advised to do so again. [place]  Controlled Substance Reporting System database was reviewed. Do not drive, operate heavy machinery, perform activities at heights, swim, participate in water activities or provide baby-sitting services while on medications for pain, sleep and mood until your outpatient physician has reevaluated you and advised to do so again.  You are strongly  recommended to comply with the dose, frequency and duration of prescribed medications. Activity: As tolerated with Full fall precautions use walker/cane & assistance as needed Avoid using any recreational substances like cigarette, tobacco, alcohol , or non-prescribed drug. If you experience worsening of your admission symptoms, develop shortness of breath, life threatening emergency, suicidal or homicidal thoughts you must seek medical attention immediately by calling 911 or calling your MD immediately  if symptoms less severe. You must read complete instructions/literature along with all the possible adverse reactions/side effects for all the medicines you take and that have been prescribed to you. Take any new medicine only after you have completely understood and accepted all the possible adverse reactions/side effects.  Wear Seat belts while driving. You were cared for by a hospitalist during your hospital stay. If you have any questions about your discharge medications or the care you received while you were in the hospital after you are discharged, you can call the unit and ask to speak with the hospitalist or the covering physician. Once you are discharged, your primary care physician will handle any further medical issues. Please note that NO REFILLS for any discharge medications will be authorized once you are discharged, as it is imperative that you return to your primary care physician (or establish a relationship with a primary care physician if you do not have one).   Increase activity slowly   Complete by: As directed        Discharge Medications:   Allergies as of 01/21/2023       Reactions   Amlodipine  Swelling   Cephalexin Other (See Comments)   unknown   Ciprofloxacin  Nausea And Vomiting   Codeine Other (See Comments)   unknown   Metoclopramide  Hcl Other (See Comments)   unknown   Nitrofuran Derivatives Other (See Comments)   unknown   Rosuvastatin    Other reaction(s):  Myalgias        Medication List     TAKE these medications    acetaminophen  325 MG tablet Commonly known as: TYLENOL  Take 2 tablets (650 mg total) by mouth every 6 (six) hours as needed for mild pain (or Fever >/= 101).   atorvastatin  20 MG tablet Commonly known as: LIPITOR Take 1 tablet (20 mg total) by mouth at bedtime.   carboxymethylcellulose 0.5 % Soln Commonly known as: REFRESH PLUS Place 1 drop into both eyes once as  needed.   cyanocobalamin  1000 MCG tablet Commonly known as: VITAMIN B12 Take 1,000 mcg by mouth every evening.   ergocalciferol  1.25 MG (50000 UT) capsule Commonly known as: VITAMIN D2 Take 50,000 Units by mouth every Friday.   feeding supplement Liqd Take 237 mLs by mouth 2 (two) times daily between meals.   gabapentin  100 MG capsule Commonly known as: NEURONTIN  Take 100 mg by mouth daily as needed (pain).   hydrALAZINE  25 MG tablet Commonly known as: APRESOLINE  TAKE 1 TABLET BY MOUTH THREE TIMES DAILY   losartan  25 MG tablet Commonly known as: COZAAR  Take 1 tablet by mouth once daily   Melatonin 10 MG Chew Chew 10 mg by mouth at bedtime as needed.   methocarbamol  500 MG tablet Commonly known as: ROBAXIN  Take 1 tablet (500 mg total) by mouth every 8 (eight) hours as needed for muscle spasms.   metoprolol  succinate 25 MG 24 hr tablet Commonly known as: TOPROL -XL Take 1 tablet (25 mg total) by mouth daily. Start taking on: January 22, 2023 What changed:  medication strength how much to take additional instructions   MiraLax  17 g packet Generic drug: polyethylene glycol Take 17 g by mouth daily.   nitroGLYCERIN  0.4 MG SL tablet Commonly known as: NITROSTAT  DISSOLVE ONE TABLET UNDER THE TONGUE EVERY 5 MINUTES AS NEEDED FOR CHEST PAIN.  DO NOT EXCEED A TOTAL OF 3 DOSES IN 15 MINUTES   omeprazole  40 MG capsule Commonly known as: PRILOSEC  Take 40 mg by mouth 2 (two) times daily.   oxyCODONE  5 MG immediate release tablet Commonly  known as: Oxy IR/ROXICODONE  Take 1 tablet (5 mg total) by mouth every 6 (six) hours as needed for up to 5 days for moderate pain (pain score 4-6).   PRESERVISION AREDS PO Take 1 capsule by mouth 2 (two) times daily.   Procto-Med HC 2.5 % rectal cream Generic drug: hydrocortisone Apply 1 application topically 2 (two) times daily.   warfarin 6 MG tablet Commonly known as: COUMADIN  Take as directed. If you are unsure how to take this medication, talk to your nurse or doctor. Original instructions: Take 3-6 mg by mouth See admin instructions. Take 6 mg by mouth daily on Monday, Wednesday  Take 3 mg by mouth daily on all other days.         The results of significant diagnostics from this hospitalization (including imaging, microbiology, ancillary and laboratory) are listed below for reference.    Procedures and Diagnostic Studies:   CT PELVIS W CONTRAST Result Date: 01/18/2023 CLINICAL DATA:  89 year old female status post fall with suspected acute pubic rami fractures. Previous bilateral hip replacement. EXAM: CT PELVIS WITH CONTRAST TECHNIQUE: Multidetector CT imaging of the pelvis was performed using the standard protocol following the bolus administration of intravenous contrast. RADIATION DOSE REDUCTION: This exam was performed according to the departmental dose-optimization program which includes automated exposure control, adjustment of the mA and/or kV according to patient size and/or use of iterative reconstruction technique. CONTRAST:  75mL OMNIPAQUE  IOHEXOL  350 MG/ML SOLN COMPARISON:  Radiographs this morning. CT Abdomen and Pelvis 04/08/2011. FINDINGS: Urinary Tract: Kidneys are not significantly changed since 2013, occasional simple fluid density benign renal cysts (no follow-up imaging recommended). No hydronephrosis. No hydroureter. Urinary bladder largely obscured from hip arthroplasty artifact. Bowel: Nondilated visible stomach, large and small bowel. No pneumoperitoneum or  mesenteric inflammation identified. Vascular/Lymphatic: Extensive Aortoiliac calcified atherosclerosis. Visible major arterial structures are patent. No pelvic lymphadenopathy. Reproductive:  Negative. Other: Small volume of free  fluid in the right hemipelvis appears to be simple fluid density on series 3, image 29. Musculoskeletal: Chronic bilateral hip arthroplasty which was present in 2013. Proximal left femoral cerclage wires appear to be new since that time. No implant dislocation. Osteopenia. No periprosthetic proximal femur fracture is identified. Lower lumbar spine appears chronically degenerated but intact. Sacrum appears stable since 2013. Chronic left sacral ala fracture. SI joints appear intact. Iliac wings appear stable.  Pubic symphysis appears intact. Acute linear minimally displaced fracture of the left superior pubic ramus near the junction with the acetabulum on series 5, image 48. Acute comminuted and mildly displaced inferior pubic ramus fracture on image 35. Contralateral chronic appearing pubic rami fractures which are new since 2013 (series 3, image 46). No superficial soft tissue injury identified. IMPRESSION: 1. Acute fractures of the left superior and inferior pubic rami. 2. Chronic appearing contralateral pubic rami fractures which are new since 2013. Chronic left sacral ala fracture. Chronic bilateral hip arthroplasty with adverse features or periprosthetic fracture identified. 3. Small volume of free fluid in the right hemipelvis is abnormal but nonspecific, perhaps reactive secondary to #1. 4.  Aortic Atherosclerosis (ICD10-I70.0). Electronically Signed   By: VEAR Hurst M.D.   On: 01/18/2023 11:12   DG Femur Min 2 Views Left Result Date: 01/18/2023 CLINICAL DATA:  Fall.  Left hip pain after a fall. EXAM: LEFT FEMUR 2 VIEWS COMPARISON:  None Available. FINDINGS: Status post left hip replacement with cerclage wires overlying the proximal femur. No evidence for periprosthetic fracture.  Patient noted to be status post tricompartmental knee replacement without complicating features on this nondedicated study. As on the pelvis x-ray, fractures are identified in the left superior and inferior pubic rami, likely acute. IMPRESSION: 1. Status post left hip replacement without evidence for periprosthetic fracture. 2. Left superior and inferior pubic rami fractures, likely acute. Electronically Signed   By: Camellia Candle M.D.   On: 01/18/2023 10:04   DG Pelvis 1-2 Views Result Date: 01/18/2023 CLINICAL DATA:  Hip pain. EXAM: PELVIS - 1-2 VIEW COMPARISON:  None Available. FINDINGS: Bones are diffusely demineralized. Patient is status post bilateral hip replacement. Femoral component of each replacement has not been completely imaged on this study. Age indeterminate but probably acute fractures are seen in the left superior and inferior pubic rami. Fracture in the right inferior pubic ramus is age indeterminate. IMPRESSION: 1. Age indeterminate but probably acute fractures in the left superior and inferior pubic rami. 2. Age indeterminate fracture in the right inferior pubic ramus. 3. Status post bilateral hip replacement. Femoral component of each replacement has not been completely imaged on this study. Electronically Signed   By: Camellia Candle M.D.   On: 01/18/2023 10:03     Labs:   Basic Metabolic Panel: Recent Labs  Lab 01/18/23 0914 01/19/23 0452 01/20/23 0446  NA 138 140 138  K 3.8 3.8 3.5  CL 105 107 106  CO2 22 25 23   GLUCOSE 106* 107* 101*  BUN 18 13 13   CREATININE 0.72 0.72 0.77  CALCIUM  9.2 8.8* 8.8*  MG 2.0  --   --    GFR Estimated Creatinine Clearance: 42.4 mL/min (by C-G formula based on SCr of 0.77 mg/dL). Liver Function Tests: Recent Labs  Lab 01/18/23 0914  AST 33  ALT 33  ALKPHOS 132*  BILITOT 1.4*  PROT 6.4*  ALBUMIN 3.6   No results for input(s): LIPASE, AMYLASE in the last 168 hours. No results for input(s): AMMONIA in the last  168  hours. Coagulation profile Recent Labs  Lab 01/18/23 0914 01/19/23 0452 01/20/23 0446 01/21/23 0506  INR 2.6* 2.6* 2.6* 2.7*    CBC: Recent Labs  Lab 01/18/23 0914 01/19/23 0452 01/20/23 0446  WBC 9.6 5.0 6.9  NEUTROABS 7.8*  --  4.8  HGB 13.6 11.8* 12.5  HCT 42.4 36.6 38.4  MCV 91.2 90.4 89.9  PLT 206 171 163   Cardiac Enzymes: No results for input(s): CKTOTAL, CKMB, CKMBINDEX, TROPONINI in the last 168 hours. BNP: Invalid input(s): POCBNP CBG: No results for input(s): GLUCAP in the last 168 hours. D-Dimer No results for input(s): DDIMER in the last 72 hours. Hgb A1c No results for input(s): HGBA1C in the last 72 hours. Lipid Profile No results for input(s): CHOL, HDL, LDLCALC, TRIG, CHOLHDL, LDLDIRECT in the last 72 hours. Thyroid  function studies No results for input(s): TSH, T4TOTAL, T3FREE, THYROIDAB in the last 72 hours.  Invalid input(s): FREET3 Anemia work up No results for input(s): VITAMINB12, FOLATE, FERRITIN, TIBC, IRON, RETICCTPCT in the last 72 hours. Microbiology No results found for this or any previous visit (from the past 240 hours).  Time coordinating discharge: 45 minutes  Signed: Dereona Kolodny  [facility] 01/21/2023, 1:12 PM

## 2023-01-21 NOTE — Care Management Important Message (Signed)
 Important Message  Patient Details  Name: Gloria [name] MRN: [id number] Date of Birth: [birth year]   Important Message Given:  Yes - Medicare IM     Dorena Bodo 01/21/2023, 2:18 PM

## 2023-01-21 NOTE — TOC Progression Note (Addendum)
 Transition of Care [facility]) - Progression Note    Patient Details  Name: ISHANA BLADES MRN: [id number] Date of Birth: [birth year]  [facility] of [facility]) CM/SW Contact  Bridget Cordella Simmonds, LCSW Phone Number: 01/21/2023, 10:12 AM  Clinical Narrative:   [facility] does offer bed, pt accepts offer.  SNF auth request submitted in [place] and approved: [id number], 3 days: 1/14-1/16.  MD notified.   CSW confirmed with [facility] that they can receive pt today.  1145: CSW spoke with daughter [name], updated her on DC today.  Discussed transportation and [name] is available to transport, will plan to come around 1315.  RN notified.   Expected Discharge Plan: Skilled Nursing Facility Barriers to Discharge: Continued Medical Work up, SNF Pending bed offer  Expected Discharge Plan and Services In-house Referral: Clinical Social Work   Post Acute Care Choice: Skilled Nursing Facility Living arrangements for the past 2 months: Single Family Home                                       Social Determinants of Health (SDOH) Interventions SDOH Screenings   Food Insecurity: No Food Insecurity (01/18/2023)  Housing: Low Risk  (01/18/2023)  Transportation Needs: No Transportation Needs (01/18/2023)  Utilities: Not At Risk (01/19/2023)  Social Connections: Moderately Integrated (01/18/2023)  Tobacco Use: Medium Risk (01/18/2023)    Readmission Risk Interventions     No data to display

## 2023-01-21 NOTE — TOC Transition Note (Signed)
 Transition of Care [facility]) - Discharge Note   Patient Details  Name: Gloria [name] MRN: [id number] Date of Birth: [birth year]  [facility] CM/SW Contact:  Bridget Cordella Simmonds, LCSW Phone Number: 01/21/2023, 11:52 AM   Clinical Narrative:   Pt discharging to [facility].  RN call report to [phone].  Pt daughter [name] will be transporting pt, planning to come to valet parking at [facility]	[place] entrance at [employer].  Will need pt brought down and help getting her into the vehicle.    Final next level of care: Skilled Nursing Facility Barriers to Discharge: Barriers Resolved   Patient Goals and CMS Choice Patient states their goals for this hospitalization and ongoing recovery are:: get back home   Choice offered to / list presented to : Patient (requesting [facility])      Discharge Placement              Patient chooses bed at: [facility], [place] Patient to be transferred to facility by: daughter [name] Name of family member notified: daughter [name] Patient and family notified of of transfer: 01/21/23  Discharge Plan and Services Additional resources added to the After Visit Summary for   In-house Referral: Clinical Social Work   Post Acute Care Choice: Skilled Nursing Facility                               Social Drivers of Health (SDOH) Interventions SDOH Screenings   Food Insecurity: No Food Insecurity (01/18/2023)  Housing: Low Risk  (01/18/2023)  Transportation Needs: No Transportation Needs (01/18/2023)  Utilities: Not At Risk (01/19/2023)  Social Connections: Moderately Integrated (01/18/2023)  Tobacco Use: Medium Risk (01/18/2023)     Readmission Risk Interventions     No data to display

## 2023-04-13 NOTE — Progress Notes (Unsigned)
 Cardiology Office Note:    Date:  04/16/2023   ID:  Gloria [name], DOB 1930/07/31, MRN [id number]  PCP:  Gloria Fillers, MD   [facility] Providers Cardiologist:  Aydan Levitz [place], MD     Referring MD: Gloria Fillers, MD   No chief complaint on file.    History of Present Illness:    Gloria [name] is a 89 y.o. female with a hx of hypertension, hyperlipidemia, GERD and PAF.  Patient was admitted in May 2018 with chest pain and shortness of breath, serial troponin was negative.  Echocardiogram showed moderate LVH, LVEF with normal systolic function.  Myoview was normal.  She had lower extremity swelling and a rash with amlodipine.  Carotid Doppler in September 2020 showed less than 39% stenosis bilaterally.  More recently, she was tested positive for COVID on 06/01/2020.  Patient presented to the hospital on 06/16/2020 with dizziness.  On admission she was bradycardic, flecainide was held.  Blood pressure was over 200 in the hospital.  HCTZ was discontinued and she was started on hydralazine.  MRI was negative for acute abnormality.  CTA of the head and neck was negative for acute abnormality as well.  Patient was assessed by PT for vestibular evaluation noted to have posterior left head roll and was treated with Epley maneuver.  She was hydrated.  Echocardiogram obtained on 06/17/2020 showed EF 60 to 65%, grade 2 DD, mild to moderate aortic valve sclerosis without evidence of aortic stenosis, mild dilatation of the aortic root measuring at 38 mm.  She was seen in follow up on 06/23/20 by Azalee Course PA-C and her Dizziness had improved but she still has occasional dizziness.  Her BP was elevated and hydralazine dose was increased to tid. She had some palpitations but deferred further evaluation at that time. She wore a long-term monitor in July 2023 which showed predominantly NSR, occasional PAC, rare PVC, PVC burden 1%.   She was evaluated in the ED on 02/21/2022 after her recent visit with  her PCP who had obtained a chest x-ray which noted small pleural effusions.  She was positive for flu A, BNP had been drawn which was elevated at > 700 however she had no signs of volume overload.  She was ultimately discharged home. Seen in follow up and doing well from a cardiac standpoint.   She was admitted in Jan with mechanical fall and pubic rami fractures. Managed medically. She spent several weeks at [facility] for rehab.   She is doing ok today. No chest pain or SOB. Feels like Afib slows her down a little but otherwise unaware. On Coumadin. Followed at [facility] medical.     Past Medical History:  Diagnosis Date   Arthritis    right   Atrial fibrillation (HCC)    GERD (gastroesophageal reflux disease)    hx of   Hepatitis C    Humerus fracture    Hyperlipidemia    Hypertension    Irritable bowel syndrome    Meningioma (HCC)    PONV (postoperative nausea and vomiting)     Past Surgical History:  Procedure Laterality Date   BREAST SURGERY     lumpectomy   CARDIOVASCULAR STRESS TEST  11/14/2006   EF 67%   COLONOSCOPY W/ BIOPSIES AND POLYPECTOMY     ORIF HUMERUS FRACTURE Right 04/18/2014   Procedure: OPEN REDUCTION INTERNAL FIXATION (ORIF) RIGHT PROXIMAL HUMERUS FRACTURE;  Surgeon: Beverely Low, MD;  Location: [facility] OR;  Service: Orthopedics;  Laterality: Right;  ORIF PERIPROSTHETIC FRACTURE Left 04/30/2013   Procedure: OPEN REDUCTION INTERNAL FIXATION (ORIF) PERIPROSTHETIC FRACTURE;  Surgeon: Shelda Pal, MD;  Location: [facility] OR;  Service: Orthopedics;  Laterality: Left;   PAROTID GLAND TUMOR EXCISION     TOTAL HIP ARTHROPLASTY  11/07   left   TOTAL KNEE ARTHROPLASTY     TOTAL KNEE ARTHROPLASTY Left 11/24/2017   Procedure: TOTAL KNEE ARTHROPLASTY;  Surgeon: Ollen Gross, MD;  Location: [facility] ORS;  Service: Orthopedics;  Laterality: Left;    TRANSTHORACIC ECHOCARDIOGRAM  10/25/2009   EF 55-60%    Current Medications: Current Meds  Medication Sig   acetaminophen  (TYLENOL) 325 MG tablet Take 2 tablets (650 mg total) by mouth every 6 (six) hours as needed for mild pain (or Fever >/= 101).   atorvastatin (LIPITOR) 20 MG tablet Take 1 tablet (20 mg total) by mouth at bedtime.   carboxymethylcellulose (REFRESH PLUS) 0.5 % SOLN Place 1 drop into both eyes once as needed.   ergocalciferol (VITAMIN D2) 1.25 MG (50000 UT) capsule Take 50,000 Units by mouth every Friday.   feeding supplement (ENSURE ENLIVE / ENSURE PLUS) LIQD Take 237 mLs by mouth 2 (two) times daily between meals.   gabapentin (NEURONTIN) 100 MG capsule Take 100 mg by mouth daily as needed (pain).   hydrALAZINE (APRESOLINE) 25 MG tablet TAKE 1 TABLET BY MOUTH THREE TIMES DAILY   losartan (COZAAR) 25 MG tablet Take 1 tablet by mouth once daily   Melatonin 10 MG CHEW Chew 10 mg by mouth at bedtime as needed.   Multiple Vitamins-Minerals (PRESERVISION AREDS PO) Take 1 capsule by mouth 2 (two) times daily.   nitroGLYCERIN (NITROSTAT) 0.4 MG SL tablet DISSOLVE ONE TABLET UNDER THE TONGUE EVERY 5 MINUTES AS NEEDED FOR CHEST PAIN.  DO NOT EXCEED A TOTAL OF 3 DOSES IN 15 MINUTES (Patient taking differently: Place 0.4 mg under the tongue every 5 (five) minutes as needed for chest pain.)   omeprazole (PRILOSEC) 40 MG capsule Take 40 mg by mouth 2 (two) times daily.   ondansetron (ZOFRAN) 4 MG tablet Take 4 mg by mouth every 8 (eight) hours as needed.   polyethylene glycol (MIRALAX) 17 g packet Take 17 g by mouth daily.   PROCTO-MED HC 2.5 % rectal cream Apply 1 application topically 2 (two) times daily.   vitamin B-12 (CYANOCOBALAMIN) 1000 MCG tablet Take 1,000 mcg by mouth every evening.    warfarin (COUMADIN) 6 MG tablet Take 3-6 mg by mouth See admin instructions. Take 6 mg by mouth daily on Monday, Wednesday  Take 3 mg by mouth daily on all other days.   [DISCONTINUED] metoprolol succinate (TOPROL-XL) 25 MG 24 hr tablet Take 1 tablet (25 mg total) by mouth daily.     Allergies:   Amlodipine,  Cephalexin, Ciprofloxacin, Codeine, Metoclopramide hcl, Nitrofuran derivatives, and Rosuvastatin   Social History   Socioeconomic History   Marital status: Widowed    Spouse name: Not on file   Number of children: 2   Years of education: Not on file   Highest education level: Not on file  Occupational History    Employer: RETIRED  Tobacco Use   Smoking status: Former    Current packs/day: 0.00    Average packs/day: 0.3 packs/day for 15.0 years (4.5 ttl pk-yrs)    Types: Cigarettes    Start date: 06/07/1965    Quit date: 06/07/1980    Years since quitting: 42.8   Smokeless tobacco: Never  Vaping Use  Vaping status: Never Used  Substance and Sexual Activity   Alcohol use: No   Drug use: No   Sexual activity: Never  Other Topics Concern   Not on file  Social History Narrative   Not on file   Social Drivers of Health   Financial Resource Strain: Not on file  Food Insecurity: No Food Insecurity (01/18/2023)   Hunger Vital Sign    Worried About Running Out of Food in the Last Year: Never true    Ran Out of Food in the Last Year: Never true  Transportation Needs: No Transportation Needs (01/18/2023)   PRAPARE - Administrator, Civil Service (Medical): No    Lack of Transportation (Non-Medical): No  Physical Activity: Not on file  Stress: Not on file  Social Connections: Moderately Integrated (01/18/2023)   Social Connection and Isolation Panel [NHANES]    Frequency of Communication with Friends and Family: More than three times a week    Frequency of Social Gatherings with Friends and Family: More than three times a week    Attends Religious Services: 1 to 4 times per year    Active Member of [employer] or Organizations: Yes    Attends Banker Meetings: 1 to 4 times per year    Marital Status: Widowed     Family History: The patient's family history includes Heart disease in her brother. There is no history of Breast cancer.  ROS:   Please see the  history of present illness.     All other systems reviewed and are negative.  EKGs/Labs/Other Studies Reviewed:    The following studies were reviewed today:  Echo 06/17/2020  1. Left ventricular ejection fraction, by estimation, is 60 to 65%. The  left ventricle has normal function. The left ventricle has no regional  wall motion abnormalities. There is mild concentric left ventricular  hypertrophy. Left ventricular diastolic  parameters are consistent with Grade II diastolic dysfunction  (pseudonormalization). Elevated left ventricular end-diastolic pressure.   2. Right ventricular systolic function is normal. The right ventricular  size is normal. Tricuspid regurgitation signal is inadequate for assessing  PA pressure.   3. Left atrial size was severely dilated.   4. The mitral valve is normal in structure. Trivial mitral valve  regurgitation. No evidence of mitral stenosis.   5. The aortic valve is tricuspid. Aortic valve regurgitation is not  visualized. Mild to moderate aortic valve sclerosis/calcification is  present, without any evidence of aortic stenosis. Aortic valve area, by  VTI measures 1.82 cm. Aortic valve mean  gradient measures 9.0 mmHg. Aortic valve Vmax measures 2.08 m/s.   6. Aortic dilatation noted. There is mild dilatation of the aortic root,  measuring 38 mm.   7. The inferior vena cava is normal in size with greater than 50%  respiratory variability, suggesting right atrial pressure of 3 mmHg.   Event monitor 07/31/21: Study Highlights      Normal sinus rhythm   Occasional PACs.   Rare PVCs   SVT longest episode 28 minutes with rate 107 bpm.   No definite evidence of AFib   No significant pauses or AV block.    EKG:  EKG is not ordered today.    Recent Labs: 01/18/2023: ALT 33; Magnesium 2.0 01/20/2023: BUN 13; Creatinine, Ser 0.77; Hemoglobin 12.5; Platelets 163; Potassium 3.5; Sodium 138  Recent Lipid Panel    Component Value Date/Time   CHOL  178 07/14/2016 0536   TRIG 69 07/14/2016 0536  HDL 58 07/14/2016 0536   CHOLHDL 3.1 07/14/2016 0536   VLDL 14 07/14/2016 0536   LDLCALC 106 (H) 07/14/2016 0536   Dated 04/18/20: Apo B 73. Cholesterol 157. LDL 77. Triglycerides 78. Dated 07/25/22: cholesterol 127, triglycerides 93, HDL 54, LDL 54. CMET normal.   Risk Assessment/Calculations:    CHA2DS2-VASc Score =    This indicates a  % annual risk of stroke. The patient's score is based upon:            Physical Exam:    VS:  BP (!) 142/78 (BP Location: Right Arm, Patient Position: Sitting)   Pulse 79   Wt 144 lb (65.3 kg)   SpO2 96%   BMI 25.51 kg/m     Wt Readings from Last 3 Encounters:  04/16/23 144 lb (65.3 kg)  01/18/23 143 lb 4.8 oz (65 kg)  10/11/22 143 lb 3.2 oz (65 kg)     GEN:  Well nourished, well developed in no acute distress HEENT: Normal NECK: No JVD; No carotid bruits LYMPHATICS: No lymphadenopathy CARDIAC: RRR, no murmurs, rubs, gallops RESPIRATORY:  Clear to auscultation without rales, wheezing or rhonchi  ABDOMEN: Soft, non-tender, non-distended MUSCULOSKELETAL:  No edema; No deformity  SKIN: Warm and dry NEUROLOGIC:  Alert and oriented x 3 PSYCHIATRIC:  Normal affect   ASSESSMENT:    1. Moderate aortic valve stenosis   2. Permanent atrial fibrillation (HCC)   3. Long term (current) use of anticoagulants        PLAN:    In order of problems listed above:  Permanent AF: Continue Coumadin therapy for anticoagulation. Rate controlled and minimal symptoms. On no rate slowing medication now.   Hypertension: Blood pressure is well controlled today  Hyperlipidemia: Continue Lipitor- well controlled.   4.   LE edema. Mild. Encourage sodium restriction. Elevation.   Follow up in 6 months        Medication Adjustments/Labs and Tests Ordered: Current medicines are reviewed at length with the patient today.  Concerns regarding medicines are outlined above.  No orders of the defined  types were placed in this encounter.   Meds ordered this encounter  Medications   metoprolol succinate (TOPROL-XL) 25 MG 24 hr tablet    Sig: Take 1 tablet (25 mg total) by mouth daily.    Dispense:  90 tablet    Refill:  3     Patient Instructions  Medication Instructions:  Continue same medications *If you need a refill on your cardiac medications before your next appointment, please call your pharmacy*  Lab Work: None ordered  Testing/Procedures: None ordered  Follow-Up: At [facility], you and your health needs are our priority.  As part of our continuing mission to provide you with exceptional heart care, our providers are all part of one team.  This team includes your primary Cardiologist (physician) and Advanced Practice Providers or APPs (Physician Assistants and Nurse Practitioners) who all work together to provide you with the care you need, when you need it.  Your next appointment:  6 months   Call in July to schedule Oct appointment     Provider: Dr.Shundra Wirsing   We recommend signing up for the patient portal called "MyChart".  Sign up information is provided on this After Visit Summary.  MyChart is used to connect with patients for Virtual Visits (Telemedicine).  Patients are able to view lab/test results, encounter notes, upcoming appointments, etc.  Non-urgent messages can be sent to your provider as well.  To learn more about what you can do with MyChart, go to [email or web address].         1st Floor: - Lobby - Registration  - Pharmacy  - Lab - Cafe  2nd Floor: - PV Lab - Diagnostic Testing (echo, CT, nuclear med)  3rd Floor: - Vacant  4th Floor: - TCTS (cardiothoracic surgery) - AFib Clinic - Structural Heart Clinic - Vascular Surgery  - Vascular Ultrasound  5th Floor: - HeartCare Cardiology (general and EP) - Clinical Pharmacy for coumadin, hypertension, lipid, weight-loss medications, and med management  appointments    Valet parking services will be available as well.      Signed, Kostas Marrow [place], MD  04/16/2023 4:37 PM    [facility]

## 2023-04-16 ENCOUNTER — Ambulatory Visit: Payer: Medicare Other | Attending: Cardiology | Admitting: Cardiology

## 2023-04-16 VITALS — BP 142/78 | HR 79 | Wt 144.0 lb

## 2023-04-16 DIAGNOSIS — I350 Nonrheumatic aortic (valve) stenosis: Secondary | ICD-10-CM | POA: Diagnosis not present

## 2023-04-16 DIAGNOSIS — I4821 Permanent atrial fibrillation: Secondary | ICD-10-CM

## 2023-04-16 DIAGNOSIS — Z7901 Long term (current) use of anticoagulants: Secondary | ICD-10-CM

## 2023-04-16 MED ORDER — METOPROLOL SUCCINATE ER 25 MG PO TB24: 25.0000 mg | ORAL_TABLET | Freq: Every day | ORAL | 3 refills | Status: DC

## 2023-04-16 NOTE — Patient Instructions (Signed)
 Medication Instructions:  Continue same medications *If you need a refill on your cardiac medications before your next appointment, please call your pharmacy*  Lab Work: None ordered  Testing/Procedures: None ordered  Follow-Up: At [facility], you and your health needs are our priority.  As part of our continuing mission to provide you with exceptional heart care, our providers are all part of one team.  This team includes your primary Cardiologist (physician) and Advanced Practice Providers or APPs (Physician Assistants and Nurse Practitioners) who all work together to provide you with the care you need, when you need it.  Your next appointment:  6 months   Call in July to schedule Oct appointment     Provider:  Dr.Jordan  We recommend signing up for the patient portal called "MyChart".  Sign up information is provided on this After Visit Summary.  MyChart is used to connect with patients for Virtual Visits (Telemedicine).  Patients are able to view lab/test results, encounter notes, upcoming appointments, etc.  Non-urgent messages can be sent to your provider as well.   To learn more about what you can do with MyChart, go to [email or web address].         1st Floor: - Lobby - Registration  - Pharmacy  - Lab - Cafe  2nd Floor: - PV Lab - Diagnostic Testing (echo, CT, nuclear med)  3rd Floor: - Vacant  4th Floor: - TCTS (cardiothoracic surgery) - AFib Clinic - Structural Heart Clinic - Vascular Surgery  - Vascular Ultrasound  5th Floor: - HeartCare Cardiology (general and EP) - Clinical Pharmacy for coumadin, hypertension, lipid, weight-loss medications, and med management appointments    Valet parking services will be available as well.

## 2023-04-18 ENCOUNTER — Telehealth: Payer: Self-pay | Admitting: Cardiology

## 2023-04-18 MED ORDER — METOPROLOL SUCCINATE ER 25 MG PO TB24: 25.0000 mg | ORAL_TABLET | Freq: Every day | ORAL | 3 refills | Status: AC

## 2023-04-18 NOTE — Telephone Encounter (Signed)
*  STAT* If patient is at the pharmacy, call can be transferred to refill team.   1. Which medications need to be refilled? (please list name of each medication and dose if known) metoprolol succinate (TOPROL-XL) 25 MG 24 hr tablet   2. Which pharmacy/location (including street and city if local pharmacy) is medication to be sent to? [id number] - [place] ([place]), [place] - [address]   3. Do they need a 30 day or 90 day supply? 90   [facility] states they didn't get the prescription. Please advise

## 2023-04-18 NOTE — Telephone Encounter (Signed)
 Pt's medication was sent to pt's pharmacy as requested. Confirmation received.

## 2023-06-24 NOTE — Progress Notes (Addendum)
 [facility] - Clinic Note  07/02/2023    CHIEF COMPLAINT Patient presents for Retina Follow Up   HISTORY OF PRESENT ILLNESS: Gloria [name] is a 89 y.o. female who presents to the clinic today for:   HPI     Retina Follow Up   Patient presents with  Dry AMD.  In both eyes.  This started 6 months ago.  I, the attending physician,  performed the HPI with the patient and updated documentation appropriately.        Comments   Patient here for 6 months retina follow up for non exu ARMD OU. Patient states vision terrible. Don't see quite as well. OS sees like cut glass temerally.no often. No eye pain. Had fallen in January and fractured pelvic bone 3 places. Healed now.      Last edited by Valdemar Rogue, MD on 07/06/2023 12:13 PM.    Pt states she feels like she's wearing her glasses a little bit more, she states sometimes in her peripheral vision OS she sees something that looks like broken glass  Referring physician: Yolande Toribio MATSU, MD [address] [place],  [place] [address]  HISTORICAL INFORMATION:   Selected notes from the MEDICAL RECORD NUMBER Referred by Dr. Rosan; now sees Dr. Charmayne LEE: 06-28-19 Ocular Hx- ARMD OU   CURRENT MEDICATIONS: Current Outpatient Medications (Ophthalmic Drugs)  Medication Sig   carboxymethylcellulose (REFRESH PLUS) 0.5 % SOLN Place 1 drop into both eyes once as needed.   No current facility-administered medications for this visit. (Ophthalmic Drugs)   Current Outpatient Medications (Other)  Medication Sig   acetaminophen  (TYLENOL ) 325 MG tablet Take 2 tablets (650 mg total) by mouth every 6 (six) hours as needed for mild pain (or Fever >/= 101).   atorvastatin  (LIPITOR) 20 MG tablet Take 1 tablet (20 mg total) by mouth at bedtime.   ergocalciferol  (VITAMIN D2) 1.25 MG (50000 UT) capsule Take 50,000 Units by mouth every Friday.   feeding supplement (ENSURE ENLIVE / ENSURE PLUS) LIQD Take 237 mLs by mouth 2  (two) times daily between meals.   gabapentin  (NEURONTIN ) 100 MG capsule Take 100 mg by mouth daily as needed (pain).   hydrALAZINE  (APRESOLINE ) 25 MG tablet TAKE 1 TABLET BY MOUTH THREE TIMES DAILY   losartan  (COZAAR ) 25 MG tablet Take 1 tablet by mouth once daily   Melatonin 10 MG CHEW Chew 10 mg by mouth at bedtime as needed.   metoprolol  succinate (TOPROL -XL) 25 MG 24 hr tablet Take 1 tablet (25 mg total) by mouth daily.   Multiple Vitamins-Minerals (PRESERVISION AREDS PO) Take 1 capsule by mouth 2 (two) times daily.   nitroGLYCERIN  (NITROSTAT ) 0.4 MG SL tablet DISSOLVE ONE TABLET UNDER THE TONGUE EVERY 5 MINUTES AS NEEDED FOR CHEST PAIN.  DO NOT EXCEED A TOTAL OF 3 DOSES IN 15 MINUTES (Patient taking differently: Place 0.4 mg under the tongue every 5 (five) minutes as needed for chest pain.)   omeprazole  (PRILOSEC ) 40 MG capsule Take 40 mg by mouth 2 (two) times daily.   ondansetron  (ZOFRAN ) 4 MG tablet Take 4 mg by mouth every 8 (eight) hours as needed.   polyethylene glycol (MIRALAX ) 17 g packet Take 17 g by mouth daily.   PROCTO-MED HC 2.5 % rectal cream Apply 1 application topically 2 (two) times daily.   vitamin B-12 (CYANOCOBALAMIN ) 1000 MCG tablet Take 1,000 mcg by mouth every evening.    warfarin (COUMADIN ) 6 MG tablet Take 3-6 mg by mouth  See admin instructions. Take 6 mg by mouth daily on Monday, Wednesday  Take 3 mg by mouth daily on all other days.   No current facility-administered medications for this visit. (Other)   REVIEW OF SYSTEMS: ROS   Positive for: Neurological, Musculoskeletal, Cardiovascular, Eyes, Respiratory Negative for: Constitutional, Gastrointestinal, Skin, Genitourinary, HENT, Endocrine, Psychiatric, Allergic/Imm, Heme/Lymph Last edited by Orval Asberry RAMAN, COA on 07/02/2023  9:56 AM.       ALLERGIES Allergies  Allergen Reactions   Amlodipine  Swelling   Cephalexin Other (See Comments)    unknown   Ciprofloxacin  Nausea And Vomiting   Codeine Other  (See Comments)    unknown   Metoclopramide  Hcl Other (See Comments)    unknown   Nitrofuran Derivatives Other (See Comments)    unknown   Rosuvastatin     Other reaction(s): Myalgias   PAST MEDICAL HISTORY Past Medical History:  Diagnosis Date   Arthritis    right   Atrial fibrillation (HCC)    GERD (gastroesophageal reflux disease)    hx of   Hepatitis C    Humerus fracture    Hyperlipidemia    Hypertension    Irritable bowel syndrome    Meningioma (HCC)    PONV (postoperative nausea and vomiting)    Past Surgical History:  Procedure Laterality Date   BREAST SURGERY     lumpectomy   CARDIOVASCULAR STRESS TEST  11/14/2006   EF 67%   COLONOSCOPY W/ BIOPSIES AND POLYPECTOMY     ORIF HUMERUS FRACTURE Right 04/18/2014   Procedure: OPEN REDUCTION INTERNAL FIXATION (ORIF) RIGHT PROXIMAL HUMERUS FRACTURE;  Surgeon: Marcey Her, MD;  Location: [facility] OR;  Service: Orthopedics;  Laterality: Right;   ORIF PERIPROSTHETIC FRACTURE Left 04/30/2013   Procedure: OPEN REDUCTION INTERNAL FIXATION (ORIF) PERIPROSTHETIC FRACTURE;  Surgeon: Donnice JONETTA Car, MD;  Location: [facility] OR;  Service: Orthopedics;  Laterality: Left;   PAROTID GLAND TUMOR EXCISION     TOTAL HIP ARTHROPLASTY  11/07   left   TOTAL KNEE ARTHROPLASTY     TOTAL KNEE ARTHROPLASTY Left 11/24/2017   Procedure: TOTAL KNEE ARTHROPLASTY;  Surgeon: Melodi Lerner, MD;  Location: [facility] ORS;  Service: Orthopedics;  Laterality: Left;    TRANSTHORACIC ECHOCARDIOGRAM  10/25/2009   EF 55-60%   FAMILY HISTORY Family History  Problem Relation Age of Onset   Heart disease Brother    Breast cancer Neg Hx    SOCIAL HISTORY Social History   Tobacco Use   Smoking status: Former    Current packs/day: 0.00    Average packs/day: 0.3 packs/day for 15.0 years (4.5 ttl pk-yrs)    Types: Cigarettes    Start date: 06/07/1965    Quit date: 06/07/1980    Years since quitting: 43.1   Smokeless tobacco: Never  Vaping Use   Vaping status: Never  Used  Substance Use Topics   Alcohol  use: No   Drug use: No       OPHTHALMIC EXAM:  Base Eye Exam     Visual Acuity (Snellen - Linear)       Right Left   Dist cc 20/80 -2 20/100 -2   Dist ph cc NI 20/80 +1    Correction: Glasses         Tonometry (Tonopen, 9:53 AM)       Right Left   Pressure 13 12         Pupils       Dark Light Shape React APD   Right 4 3 Round  Brisk None   Left 4 3 Round Brisk None         Visual Fields (Counting fingers)       Left Right    Full Full         Extraocular Movement       Right Left    Full, Ortho Full, Ortho         Neuro/Psych     Oriented x3: Yes   Mood/Affect: Normal         Dilation     Both eyes: 1.0% Mydriacyl, 2.5% Phenylephrine  @ 9:53 AM           Slit Lamp and Fundus Exam     Slit Lamp Exam       Right Left   Lids/Lashes Dermatochalasis - upper lid Dermatochalasis - upper lid, Meibomian gland dysfunction   Conjunctiva/Sclera White and quiet White and quiet   Cornea +flecks; mild haze, well healed cataract wounds, 2-3+ fine Punctate epithelial erosions, irregular epi, arcus +flecks; well healed cataract wounds, 1+ Punctate epithelial erosions, tear film debris, irregular epi, arcus, trace Descemet's folds   Anterior Chamber deep and clear deep and clear   Iris Round and dilated Round and dilated   Lens PC IOL in good position with open PC PC IOL in good position with open PC   Anterior Vitreous mild syneresis, Posterior vitreous detachment mild syneresis, Posterior vitreous detachment         Fundus Exam       Right Left   Disc mild pallor; sharp rim mild pallor; sharp rim, mild PPA   C/D Ratio 0.2 0.1   Macula flat; drusen; central PEDs; blunted foveal reflex; RPE mottling, clumping and early atrophy, no heme or edema flat; blunted foveal reflex; drusen; RPE mottling and clumping, mild PEDs, no heme or edema   Vessels attenuated, mild tortuosity attenuated, mild tortuosity    Periphery attached; reticular degeneration; +midzonal drusen, No heme attached; reticular degen; midzonal drusen, No heme           Refraction     Wearing Rx       Sphere Cylinder Axis Add   Right -0.50 +1.25 010 +3.00   Left -0.75 +2.25 006 +3.00    Type: prog           IMAGING AND PROCEDURES  Imaging and Procedures for 07/02/2023  OCT, Retina - OU - Both Eyes       Right Eye Quality was borderline. Central Foveal Thickness: 341. Progression has been stable. Findings include no IRF, no SRF, abnormal foveal contour, retinal drusen , subretinal hyper-reflective material, intraretinal hyper-reflective material, pigment epithelial detachment, outer retinal atrophy (Stable persistent central PED and SRHM; no fluid).   Left Eye Quality was borderline. Central Foveal Thickness: 296. Progression has been stable. Findings include normal foveal contour, no IRF, no SRF, retinal drusen , pigment epithelial detachment, outer retinal atrophy (Central PED; no fluid, patchy ORA).   Notes *Images captured and stored on drive  Diagnosis / Impression:  Non-exu ARMD OU OD: Stable persistent central PED and SRHM; no fluid OS: Central PED; no fluid, patchy ORA  Clinical management:  See below  Abbreviations: NFP - Normal foveal profile. CME - cystoid macular edema. PED - pigment epithelial detachment. IRF - intraretinal fluid. SRF - subretinal fluid. EZ - ellipsoid zone. ERM - epiretinal membrane. ORA - outer retinal atrophy. ORT - outer retinal tubulation. SRHM - subretinal hyper-reflective material. IRHM - intraretinal hyper-reflective material  ASSESSMENT/PLAN:    ICD-10-CM   1. Intermediate stage nonexudative age-related macular degeneration of both eyes  H35.3132 OCT, Retina - OU - Both Eyes    2. Fleck corneal dystrophy  H18.599     3. Essential hypertension  I10     4. Hypertensive retinopathy of both eyes  H35.033     5. Pseudophakia of both eyes  Z96.1        1. Age related macular degeneration, non-exudative, OU -- stable  - Intermediate stage with prominent central PEDs OU (OD > OS) - BCVA 20/80 -- improved - OCT shows OD: Stable persistent central PED and SRHM; no fluid; OS: Central PED; no fluid, patchy ORA  - cont AREDS 2 supplementation and amsler grid monitoring  - f/u 9 months DFE, OCT  2. Fleck corneal dystrophy OU - now under the expert management of Dr. Charmayne  - Using Systane OU at bedtime and Refresh OU PRN  3,4. Hypertensive retinopathy OU - discussed importance of tight BP control - continue to monitor  5. Pseudophakia OU  - s/p CE/IOL OU  - IOLs in good position, doing well  - continue to monitor  Ophthalmic Meds Ordered this visit:  No orders of the defined types were placed in this encounter.    Return in about 9 months (around 03/31/2024) for f/u non-exu ARMD OU, DFE, OCT.  There are no Patient Instructions on file for this visit.   Explained the diagnoses, plan, and follow up with the patient and they expressed understanding.  Patient expressed understanding of the importance of proper follow up care.    This document serves as a record of services personally performed by Redell JUDITHANN Hans, MD, PhD. It was created on their behalf by Almetta Pesa, an ophthalmic technician. The creation of this record is the provider's dictation and/or activities during the visit.    Electronically signed by: Almetta Pesa, OA, 07/06/23  2:46 PM  This document serves as a record of services personally performed by Redell JUDITHANN Hans, MD, PhD. It was created on their behalf by Alan PARAS. Delores, OA an ophthalmic technician. The creation of this record is the provider's dictation and/or activities during the visit.    Electronically signed by: Alan PARAS. Delores, OA 07/06/23 2:46 PM  Redell JUDITHANN Hans, M.D., Ph.D. Diseases & Surgery of the Retina and Vitreous [facility]  I have reviewed the above  documentation for accuracy and completeness, and I agree with the above. Redell JUDITHANN Hans, M.D., Ph.D. 07/06/23 2:46 PM   Abbreviations: M myopia (nearsighted); A astigmatism; H hyperopia (farsighted); P presbyopia; Mrx spectacle prescription;  CTL contact lenses; OD right eye; OS left eye; OU both eyes  XT exotropia; ET esotropia; PEK punctate epithelial keratitis; PEE punctate epithelial erosions; DES dry eye syndrome; MGD meibomian gland dysfunction; ATs artificial tears; PFAT's preservative free artificial tears; NSC nuclear sclerotic cataract; PSC posterior subcapsular cataract; ERM epi-retinal membrane; PVD posterior vitreous detachment; RD retinal detachment; DM diabetes mellitus; DR diabetic retinopathy; NPDR non-proliferative diabetic retinopathy; PDR proliferative diabetic retinopathy; CSME clinically significant macular edema; DME diabetic macular edema; dbh dot blot hemorrhages; CWS cotton wool spot; POAG primary open angle glaucoma; C/D cup-to-disc ratio; HVF humphrey visual field; GVF goldmann visual field; OCT optical coherence tomography; IOP intraocular pressure; BRVO Branch retinal vein occlusion; CRVO central retinal vein occlusion; CRAO central retinal artery occlusion; BRAO branch retinal artery occlusion; RT retinal tear; SB scleral buckle; PPV pars plana vitrectomy; VH Vitreous hemorrhage; PRP  panretinal laser photocoagulation; IVK intravitreal kenalog; VMT vitreomacular traction; MH Macular hole;  NVD neovascularization of the disc; NVE neovascularization elsewhere; AREDS age related eye disease study; ARMD age related macular degeneration; POAG primary open angle glaucoma; EBMD epithelial/anterior basement membrane dystrophy; ACIOL anterior chamber intraocular lens; IOL intraocular lens; PCIOL posterior chamber intraocular lens; Phaco/IOL phacoemulsification with intraocular lens placement; PRK photorefractive keratectomy; LASIK laser assisted in situ keratomileusis; HTN hypertension; DM  diabetes mellitus; COPD chronic obstructive pulmonary disease

## 2023-07-02 ENCOUNTER — Ambulatory Visit (INDEPENDENT_AMBULATORY_CARE_PROVIDER_SITE_OTHER): Payer: Medicare Other | Admitting: Ophthalmology

## 2023-07-02 ENCOUNTER — Encounter (INDEPENDENT_AMBULATORY_CARE_PROVIDER_SITE_OTHER): Payer: Self-pay | Admitting: Ophthalmology

## 2023-07-02 DIAGNOSIS — H35033 Hypertensive retinopathy, bilateral: Secondary | ICD-10-CM

## 2023-07-02 DIAGNOSIS — I10 Essential (primary) hypertension: Secondary | ICD-10-CM

## 2023-07-02 DIAGNOSIS — H353132 Nonexudative age-related macular degeneration, bilateral, intermediate dry stage: Secondary | ICD-10-CM

## 2023-07-02 DIAGNOSIS — H18599 Other hereditary corneal dystrophies, unspecified eye: Secondary | ICD-10-CM

## 2023-07-02 DIAGNOSIS — Z961 Presence of intraocular lens: Secondary | ICD-10-CM

## 2023-07-06 ENCOUNTER — Encounter (INDEPENDENT_AMBULATORY_CARE_PROVIDER_SITE_OTHER): Payer: Self-pay | Admitting: Ophthalmology

## 2023-07-07 ENCOUNTER — Other Ambulatory Visit: Payer: Self-pay | Admitting: Cardiology

## 2023-10-06 ENCOUNTER — Other Ambulatory Visit: Payer: Self-pay | Admitting: Internal Medicine

## 2023-10-06 DIAGNOSIS — Z1231 Encounter for screening mammogram for malignant neoplasm of breast: Secondary | ICD-10-CM

## 2023-10-24 NOTE — Progress Notes (Unsigned)
 Cardiology Office Note:    Date:  10/28/2023   ID:  Gloria [name], DOB [birth year], MRN [id number]  PCP:  Yolande Toribio MATSU, MD   [facility] Providers Cardiologist:  Tyia Binford [place], MD     Referring MD: Yolande Toribio MATSU, MD   Chief Complaint  Patient presents with   Atrial Fibrillation   Moderate aortic valve stenosis   Follow-up     History of Present Illness:    Gloria [name] is a 89 y.o. female with a hx of hypertension, hyperlipidemia, GERD and PAF.  Patient was admitted in May 2018 with chest pain and shortness of breath, serial troponin was negative.  Echocardiogram showed moderate LVH, LVEF with normal systolic function.  Myoview  was normal.  She had lower extremity swelling and a rash with amlodipine .  Carotid Doppler in September 2020 showed less than 39% stenosis bilaterally.  More recently, she was tested positive for COVID on 06/01/2020.  Patient presented to the hospital on 06/16/2020 with dizziness.  On admission she was bradycardic, flecainide  was held.  Blood pressure was over 200 in the hospital.  HCTZ was discontinued and she was started on hydralazine .  MRI was negative for acute abnormality.  CTA of the head and neck was negative for acute abnormality as well.  Patient was assessed by PT for vestibular evaluation noted to have posterior left head roll and was treated with Epley maneuver.  She was hydrated.  Echocardiogram obtained on 06/17/2020 showed EF 60 to 65%, grade 2 DD, mild to moderate aortic valve sclerosis without evidence of aortic stenosis, mild dilatation of the aortic root measuring at 38 mm.  She was seen in follow up on 06/23/20 by Scot Ford PA-C and her Dizziness had improved but she still has occasional dizziness.  Her BP was elevated and hydralazine  dose was increased to tid. She had some palpitations but deferred further evaluation at that time. She wore a long-term monitor in July 2023 which showed predominantly NSR, occasional PAC, rare PVC, PVC  burden 1%.   She was evaluated in the ED on 02/21/2022 after her recent visit with her PCP who had obtained a chest x-ray which noted small pleural effusions.  She was positive for flu A, BNP had been drawn which was elevated at > 700 however she had no signs of volume overload.  She was ultimately discharged home. Seen in follow up and doing well from a cardiac standpoint.   She was admitted in Jan with mechanical fall and pubic rami fractures. Managed medically. She spent several weeks at [facility] for rehab.   She is doing ok today. No chest pain or SOB. No bleeding issues. No further falls.     Past Medical History:  Diagnosis Date   Arthritis    right   Atrial fibrillation (HCC)    GERD (gastroesophageal reflux disease)    hx of   Hepatitis C    Humerus fracture    Hyperlipidemia    Hypertension    Irritable bowel syndrome    Meningioma (HCC)    PONV (postoperative nausea and vomiting)     Past Surgical History:  Procedure Laterality Date   BREAST SURGERY     lumpectomy   CARDIOVASCULAR STRESS TEST  11/14/2006   EF 67%   COLONOSCOPY W/ BIOPSIES AND POLYPECTOMY     ORIF HUMERUS FRACTURE Right 04/18/2014   Procedure: OPEN REDUCTION INTERNAL FIXATION (ORIF) RIGHT PROXIMAL HUMERUS FRACTURE;  Surgeon: Marcey Her, MD;  Location: [facility] OR;  Service:  Orthopedics;  Laterality: Right;   ORIF PERIPROSTHETIC FRACTURE Left 04/30/2013   Procedure: OPEN REDUCTION INTERNAL FIXATION (ORIF) PERIPROSTHETIC FRACTURE;  Surgeon: Donnice JONETTA Car, MD;  Location: [facility] OR;  Service: Orthopedics;  Laterality: Left;   PAROTID GLAND TUMOR EXCISION     TOTAL HIP ARTHROPLASTY  11/07   left   TOTAL KNEE ARTHROPLASTY     TOTAL KNEE ARTHROPLASTY Left 11/24/2017   Procedure: TOTAL KNEE ARTHROPLASTY;  Surgeon: Melodi Lerner, MD;  Location: [facility] ORS;  Service: Orthopedics;  Laterality: Left;    TRANSTHORACIC ECHOCARDIOGRAM  10/25/2009   EF 55-60%    Current Medications: Current Meds  Medication Sig    acetaminophen  (TYLENOL ) 325 MG tablet Take 2 tablets (650 mg total) by mouth every 6 (six) hours as needed for mild pain (or Fever >/= 101).   atorvastatin  (LIPITOR) 20 MG tablet Take 1 tablet (20 mg total) by mouth at bedtime.   carboxymethylcellulose (REFRESH PLUS) 0.5 % SOLN Place 1 drop into both eyes once as needed.   ergocalciferol  (VITAMIN D2) 1.25 MG (50000 UT) capsule Take 50,000 Units by mouth every Friday.   feeding supplement (ENSURE ENLIVE / ENSURE PLUS) LIQD Take 237 mLs by mouth 2 (two) times daily between meals.   gabapentin  (NEURONTIN ) 100 MG capsule Take 100 mg by mouth daily as needed (pain).   hydrALAZINE  (APRESOLINE ) 25 MG tablet TAKE 1 TABLET BY MOUTH THREE TIMES DAILY   losartan  (COZAAR ) 25 MG tablet Take 1 tablet by mouth once daily   Melatonin 10 MG CHEW Chew 10 mg by mouth at bedtime as needed.   metoprolol  succinate (TOPROL -XL) 25 MG 24 hr tablet Take 1 tablet (25 mg total) by mouth daily.   Multiple Vitamins-Minerals (PRESERVISION AREDS PO) Take 1 capsule by mouth 2 (two) times daily.   nitroGLYCERIN  (NITROSTAT ) 0.4 MG SL tablet DISSOLVE ONE TABLET UNDER THE TONGUE EVERY 5 MINUTES AS NEEDED FOR CHEST PAIN.  DO NOT EXCEED A TOTAL OF 3 DOSES IN 15 MINUTES (Patient taking differently: Place 0.4 mg under the tongue every 5 (five) minutes as needed for chest pain.)   omeprazole  (PRILOSEC ) 40 MG capsule Take 40 mg by mouth 2 (two) times daily.   ondansetron  (ZOFRAN ) 4 MG tablet Take 4 mg by mouth every 8 (eight) hours as needed.   polyethylene glycol (MIRALAX ) 17 g packet Take 17 g by mouth daily.   PROCTO-MED HC 2.5 % rectal cream Apply 1 application topically 2 (two) times daily.   vitamin B-12 (CYANOCOBALAMIN ) 1000 MCG tablet Take 1,000 mcg by mouth every evening.    warfarin (COUMADIN ) 6 MG tablet Take 3-6 mg by mouth See admin instructions. Take 6 mg by mouth daily on Monday, Wednesday  Take 3 mg by mouth daily on all other days.     Allergies:   Amlodipine ,  Cephalexin, Ciprofloxacin , Codeine, Metoclopramide  hcl, Nitrofuran derivatives, and Rosuvastatin   Social History   Socioeconomic History   Marital status: Widowed    Spouse name: Not on file   Number of children: 2   Years of education: Not on file   Highest education level: Not on file  Occupational History    Employer: RETIRED  Tobacco Use   Smoking status: Former    Current packs/day: 0.00    Average packs/day: 0.3 packs/day for 15.0 years (4.5 ttl pk-yrs)    Types: Cigarettes    Start date: 06/07/1965    Quit date: 06/07/1980    Years since quitting: 43.4   Smokeless tobacco: Never  Vaping Use   Vaping status: Never Used  Substance and Sexual Activity   Alcohol  use: No   Drug use: No   Sexual activity: Never  Other Topics Concern   Not on file  Social History Narrative   Not on file   Social Drivers of Health   Financial Resource Strain: Not on file  Food Insecurity: No Food Insecurity (01/18/2023)   Hunger Vital Sign    Worried About Running Out of Food in the Last Year: Never true    Ran Out of Food in the Last Year: Never true  Transportation Needs: No Transportation Needs (01/18/2023)   PRAPARE - Administrator, Civil Service (Medical): No    Lack of Transportation (Non-Medical): No  Physical Activity: Not on file  Stress: Not on file  Social Connections: Moderately Integrated (01/18/2023)   Social Connection and Isolation Panel    Frequency of Communication with Friends and Family: More than three times a week    Frequency of Social Gatherings with Friends and Family: More than three times a week    Attends Religious Services: 1 to 4 times per year    Active Member of [employer] or Organizations: Yes    Attends Banker Meetings: 1 to 4 times per year    Marital Status: Widowed     Family History: The patient's family history includes Heart disease in her brother. There is no history of Breast cancer.  ROS:   Please see the history of  present illness.     All other systems reviewed and are negative.  EKGs/Labs/Other Studies Reviewed:    The following studies were reviewed today:  Echo 06/17/2020  1. Left ventricular ejection fraction, by estimation, is 60 to 65%. The  left ventricle has normal function. The left ventricle has no regional  wall motion abnormalities. There is mild concentric left ventricular  hypertrophy. Left ventricular diastolic  parameters are consistent with Grade II diastolic dysfunction  (pseudonormalization). Elevated left ventricular end-diastolic pressure.   2. Right ventricular systolic function is normal. The right ventricular  size is normal. Tricuspid regurgitation signal is inadequate for assessing  PA pressure.   3. Left atrial size was severely dilated.   4. The mitral valve is normal in structure. Trivial mitral valve  regurgitation. No evidence of mitral stenosis.   5. The aortic valve is tricuspid. Aortic valve regurgitation is not  visualized. Mild to moderate aortic valve sclerosis/calcification is  present, without any evidence of aortic stenosis. Aortic valve area, by  VTI measures 1.82 cm. Aortic valve mean  gradient measures 9.0 mmHg. Aortic valve Vmax measures 2.08 m/s.   6. Aortic dilatation noted. There is mild dilatation of the aortic root,  measuring 38 mm.   7. The inferior vena cava is normal in size with greater than 50%  respiratory variability, suggesting right atrial pressure of 3 mmHg.   Event monitor 07/31/21: Study Highlights      Normal sinus rhythm   Occasional PACs.   Rare PVCs   SVT longest episode 28 minutes with rate 107 bpm.   No definite evidence of AFib   No significant pauses or AV block.    EKG:  EKG is not ordered today.    Recent Labs: 01/18/2023: ALT 33; Magnesium  2.0 01/20/2023: BUN 13; Creatinine, Ser 0.77; Hemoglobin 12.5; Platelets 163; Potassium 3.5; Sodium 138  Recent Lipid Panel    Component Value Date/Time   CHOL 178  07/14/2016 0536   TRIG 69  07/14/2016 0536   HDL 58 07/14/2016 0536   CHOLHDL 3.1 07/14/2016 0536   VLDL 14 07/14/2016 0536   LDLCALC 106 (H) 07/14/2016 0536   Dated 04/18/20: Apo B 73. Cholesterol 157. LDL 77. Triglycerides 78. Dated 07/25/22: cholesterol 127, triglycerides 93, HDL 54, LDL 54. CMET normal.   Risk Assessment/Calculations:    CHA2DS2-VASc Score =    This indicates a  % annual risk of stroke. The patient's score is based upon:            Physical Exam:    VS:  BP (!) 140/88 (BP Location: Left Arm, Patient Position: Sitting, Cuff Size: Normal)   Pulse 97   Resp 16   Ht 5' 3 (1.6 m)   Wt 142 lb (64.4 kg)   SpO2 95%   BMI 25.15 kg/m     Wt Readings from Last 3 Encounters:  10/28/23 142 lb (64.4 kg)  04/16/23 144 lb (65.3 kg)  01/18/23 143 lb 4.8 oz (65 kg)     GEN:  Well nourished, well developed in no acute distress HEENT: Normal NECK: No JVD; No carotid bruits LYMPHATICS: No lymphadenopathy CARDIAC: RRR, no murmurs, rubs, gallops RESPIRATORY:  Clear to auscultation without rales, wheezing or rhonchi  ABDOMEN: Soft, non-tender, non-distended MUSCULOSKELETAL:  No edema; No deformity  SKIN: Warm and dry NEUROLOGIC:  Alert and oriented x 3 PSYCHIATRIC:  Normal affect   ASSESSMENT:    1. Moderate aortic valve stenosis   2. Permanent atrial fibrillation (HCC)   3. Essential hypertension         PLAN:    In order of problems listed above:  Permanent AF: Continue Coumadin  therapy for anticoagulation. Rate controlled and minimal symptoms. On no rate slowing medication now.   Hypertension: Blood pressure is mildly elevated today but reports better readings at home. Would allow more permissive control given age and risk of falls.   Hyperlipidemia: Continue Lipitor- well controlled.   4.   LE edema. Mild- stable. Encourage sodium restriction. Elevation.   Follow up in 6 months        Medication Adjustments/Labs and Tests Ordered: Current  medicines are reviewed at length with the patient today.  Concerns regarding medicines are outlined above.  No orders of the defined types were placed in this encounter.   No orders of the defined types were placed in this encounter.    There are no Patient Instructions on file for this visit.   Signed, Elizebeth Kluesner [place], MD  10/28/2023 10:18 AM    [facility]

## 2023-10-28 ENCOUNTER — Ambulatory Visit: Attending: Cardiology | Admitting: Cardiology

## 2023-10-28 ENCOUNTER — Encounter: Payer: Self-pay | Admitting: Cardiology

## 2023-10-28 VITALS — BP 140/88 | HR 97 | Resp 16 | Ht 63.0 in | Wt 142.0 lb

## 2023-10-28 DIAGNOSIS — I10 Essential (primary) hypertension: Secondary | ICD-10-CM | POA: Diagnosis not present

## 2023-10-28 DIAGNOSIS — I350 Nonrheumatic aortic (valve) stenosis: Secondary | ICD-10-CM

## 2023-10-28 DIAGNOSIS — I4821 Permanent atrial fibrillation: Secondary | ICD-10-CM | POA: Diagnosis not present

## 2023-10-28 NOTE — Patient Instructions (Signed)
 Medication Instructions:  Continue same medications *If you need a refill on your cardiac medications before your next appointment, please call your pharmacy*  Lab Work: None ordered  Testing/Procedures: None ordered  Follow-Up: At [facility], you and your health needs are our priority.  As part of our continuing mission to provide you with exceptional heart care, our providers are all part of one team.  This team includes your primary Cardiologist (physician) and Advanced Practice Providers or APPs (Physician Assistants and Nurse Practitioners) who all work together to provide you with the care you need, when you need it.  Your next appointment  6 months   Call in Jan to schedule April appointment     Provider:  Dr.Jordan   We recommend signing up for the patient portal called MyChart.  Sign up information is provided on this After Visit Summary.  MyChart is used to connect with patients for Virtual Visits (Telemedicine).  Patients are able to view lab/test results, encounter notes, upcoming appointments, etc.  Non-urgent messages can be sent to your provider as well.   To learn more about what you can do with MyChart, go to [email or web address].

## 2023-11-17 ENCOUNTER — Ambulatory Visit

## 2023-12-08 ENCOUNTER — Other Ambulatory Visit: Payer: Self-pay | Admitting: Cardiology

## 2023-12-10 ENCOUNTER — Telehealth: Payer: Self-pay | Admitting: Cardiology

## 2023-12-10 MED ORDER — LOSARTAN POTASSIUM 25 MG PO TABS: 25.0000 mg | ORAL_TABLET | Freq: Every day | ORAL | 0 refills | Status: AC

## 2023-12-10 NOTE — Telephone Encounter (Signed)
*  STAT* If patient is at the pharmacy, call can be transferred to refill team.   1. Which medications need to be refilled? (please list name of each medication and dose if known)  losartan  (COZAAR ) 25 MG tablet   2. Would you like to learn more about the convenience, safety, & potential cost savings by using the [facility]? no   3. Are you open to using the [facility] (Type [facility]. no   4. Which pharmacy/location (including street and city if local pharmacy) is medication to be sent to?  [id number] - [place] ([place]), [place] - [address]    5. Do they need a 30 day or 90 day supply? 90 days.

## 2023-12-10 NOTE — Telephone Encounter (Signed)
 Requested Prescriptions   Signed Prescriptions Disp Refills   losartan  (COZAAR ) 25 MG tablet 90 tablet 0    Sig: Take 1 tablet (25 mg total) by mouth daily.    Authorizing Provider: JORDAN, PETER M    Ordering User: WILFRED, Daran Favaro  C

## 2023-12-11 ENCOUNTER — Other Ambulatory Visit: Payer: Self-pay | Admitting: Cardiology

## 2024-01-01 ENCOUNTER — Other Ambulatory Visit: Payer: Self-pay | Admitting: Cardiology

## 2024-03-31 ENCOUNTER — Encounter (INDEPENDENT_AMBULATORY_CARE_PROVIDER_SITE_OTHER): Admitting: Ophthalmology

## 2024-04-30 ENCOUNTER — Ambulatory Visit: Admitting: Cardiology

## 2024-05-03 ENCOUNTER — Ambulatory Visit: Admitting: Cardiology
